# Patient Record
Sex: Female | Born: 1990 | Race: Black or African American | Hispanic: No | Marital: Single | Smoking: Never smoker
Health system: Southern US, Community
[De-identification: ages and names within clinical notes are randomized; demographics above are authoritative.]

## PROBLEM LIST (undated history)

## (undated) DIAGNOSIS — O24419 Gestational diabetes mellitus in pregnancy, unspecified control: Secondary | ICD-10-CM

## (undated) DIAGNOSIS — E119 Type 2 diabetes mellitus without complications: Secondary | ICD-10-CM

---

## 2013-12-14 IMAGING — US US MFM OB DETAIL+14 WK
1 series · 14 of 28 positions shown · non-contrast
Comparison: none

[Series 1: us mfm ob detail+14 wk · 0.23mm/px · 14 of 99 slices shown]
[im 4/99]
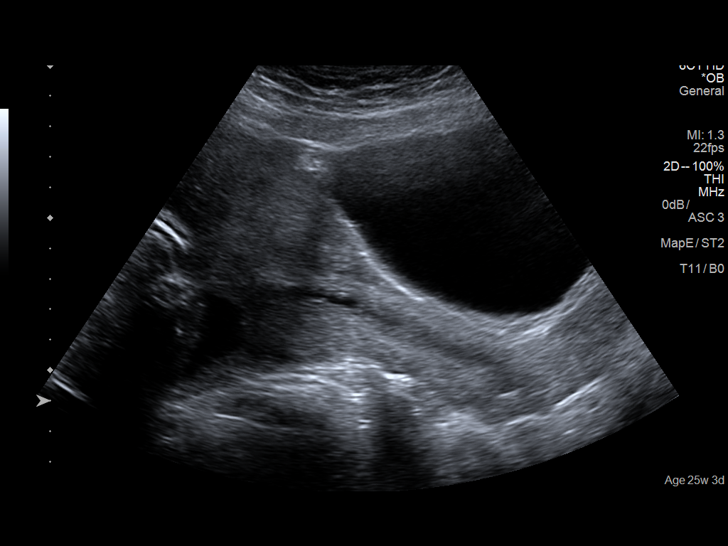
[im 11/99]
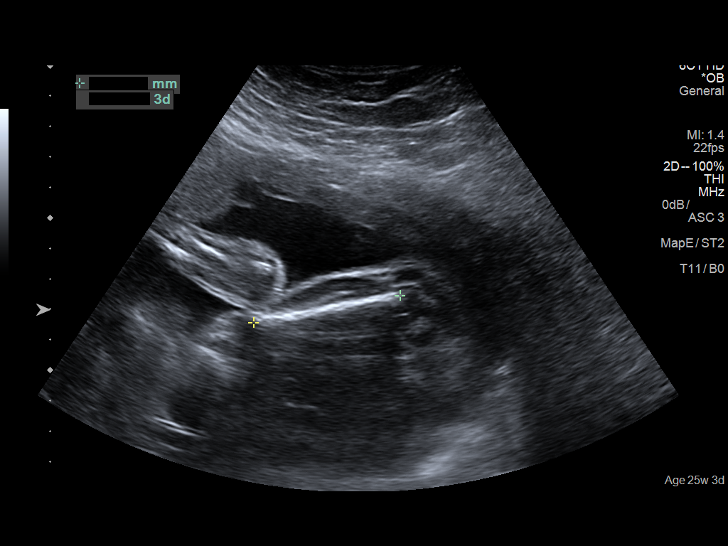
[im 19/99]
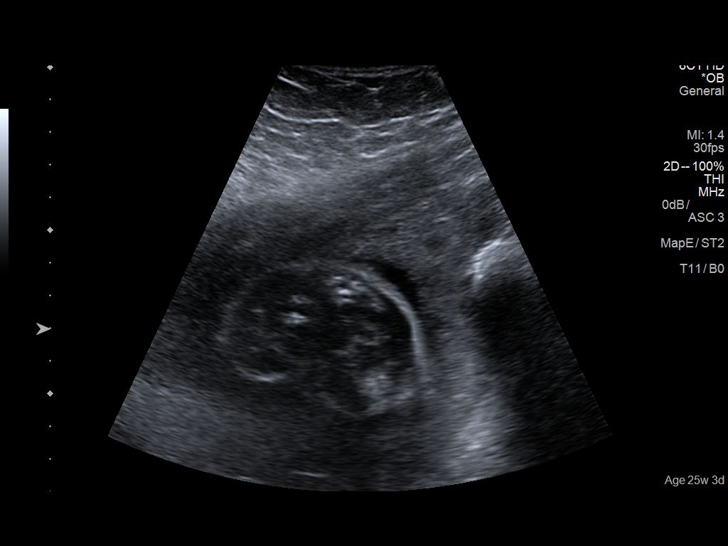
[im 26/99]
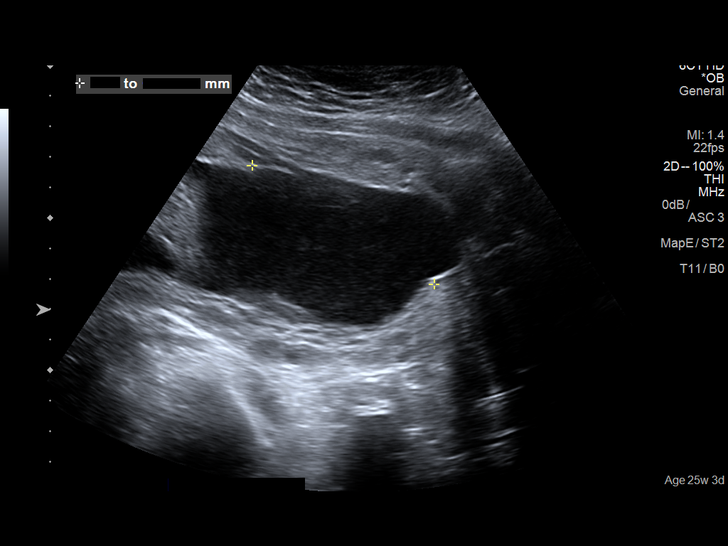
[im 33/99]
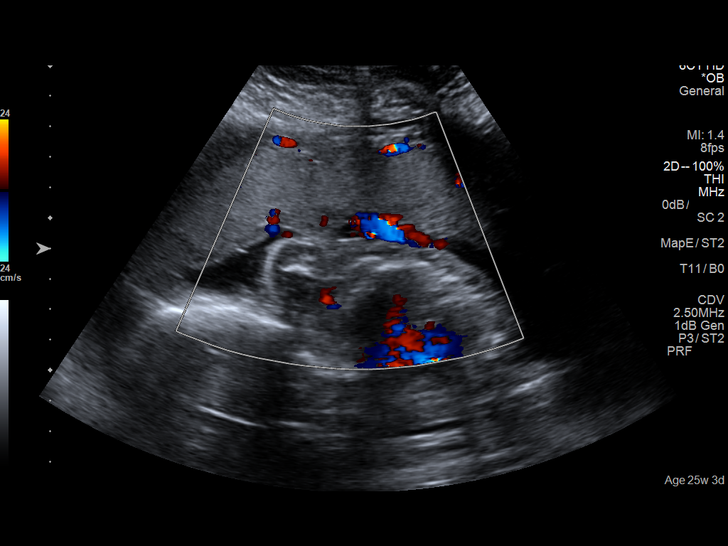
[im 40/99]
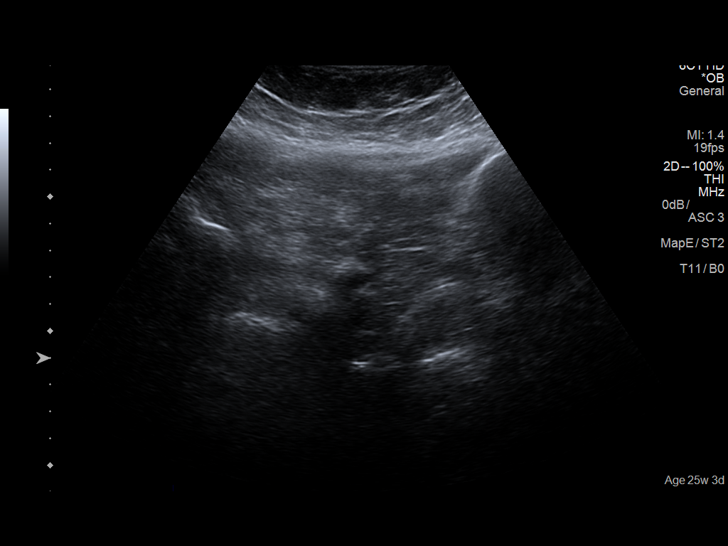
[im 48/99]
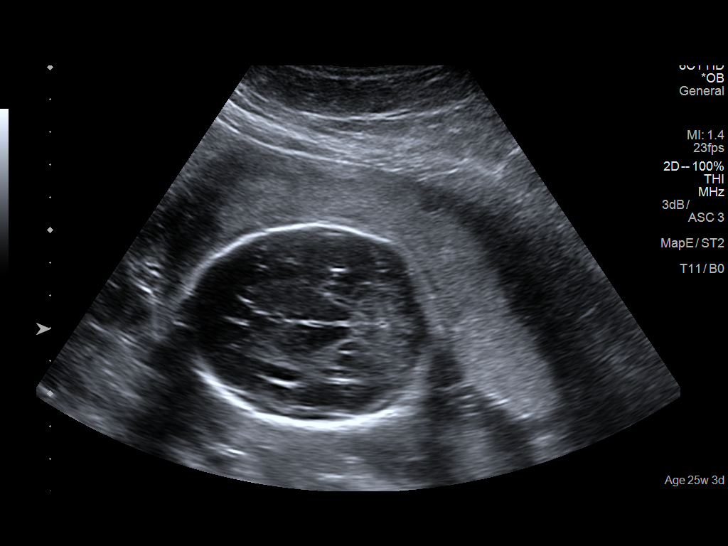
[im 55/99]
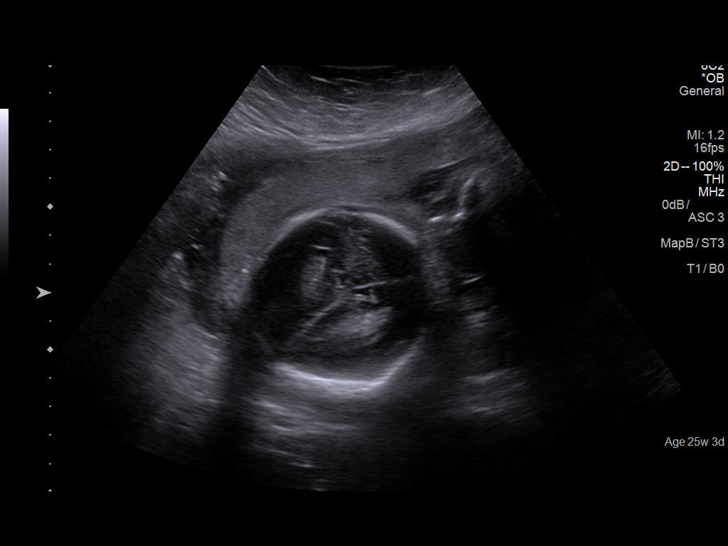
[im 62/99]
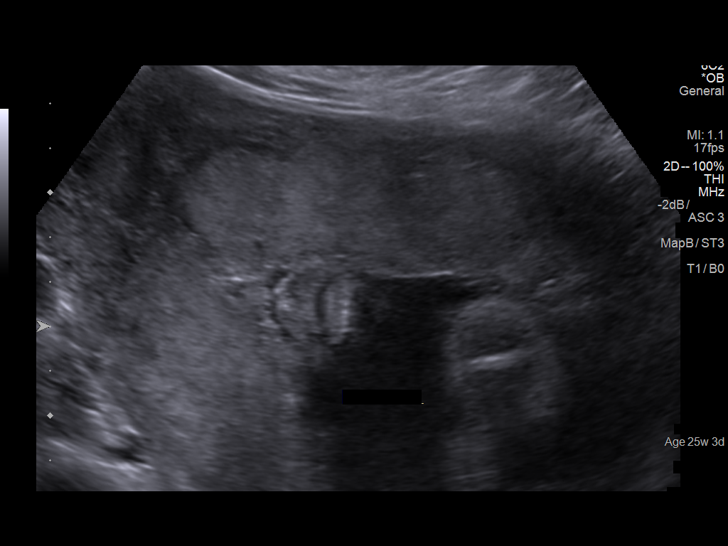
[im 69/99]
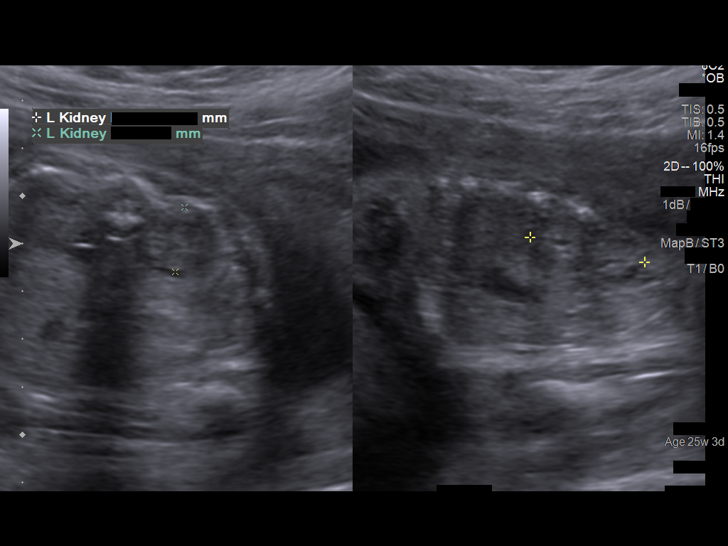
[im 77/99]
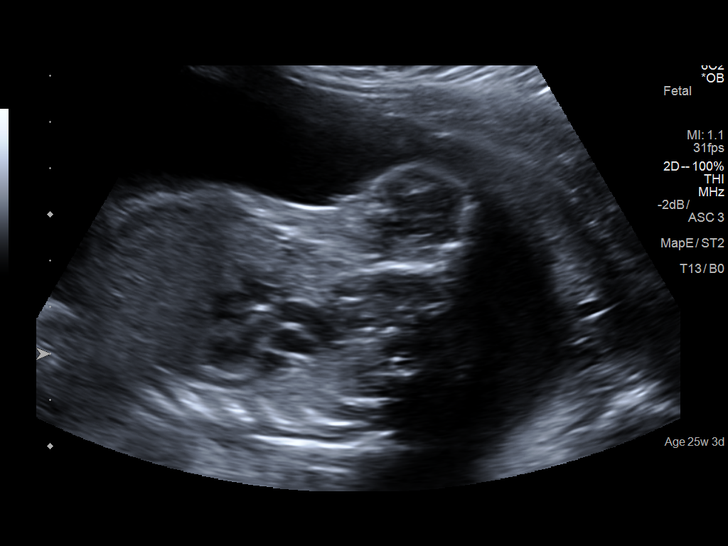
[im 84/99]
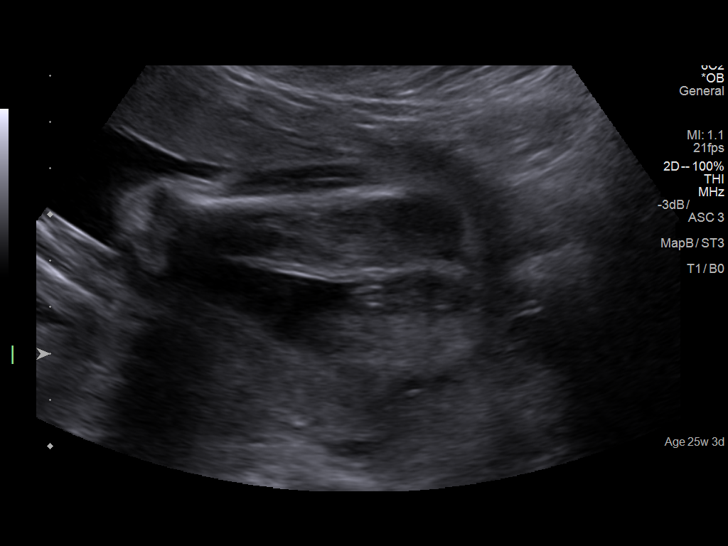
[im 91/99]
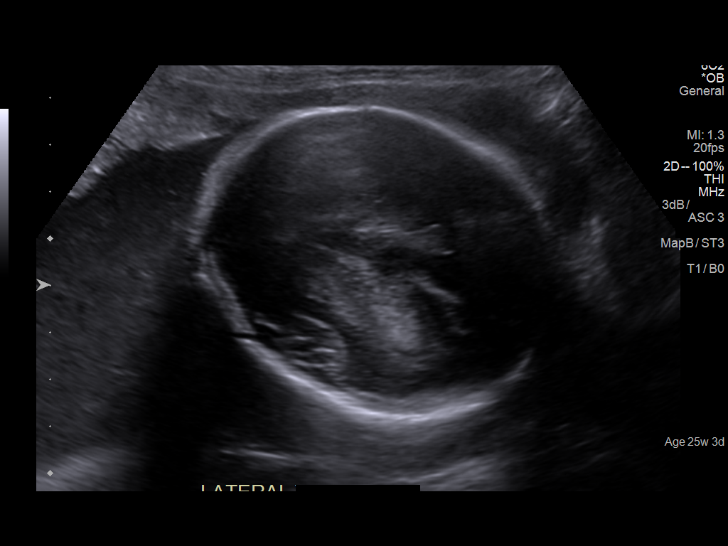
[im 99/99]
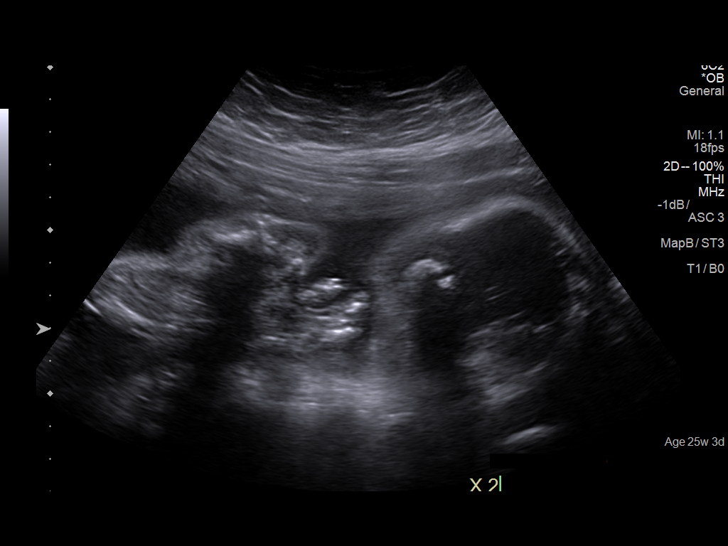

[14 of 28 positions shown; findings below may reference images not displayed]

Canned report from images found in remote index.

Refer to host system for actual result text.

## 2014-10-29 LAB — OB RESULTS CONSOLE GC/CHLAMYDIA
CHLAMYDIA, DNA PROBE: NEGATIVE
GC PROBE AMP, GENITAL: NEGATIVE

## 2014-11-10 LAB — OB RESULTS CONSOLE ABO/RH: RH Type: POSITIVE

## 2014-11-10 LAB — OB RESULTS CONSOLE VARICELLA ZOSTER ANTIBODY, IGG: VARICELLA IGG: NON-IMMUNE/NOT IMMUNE

## 2014-11-10 LAB — OB RESULTS CONSOLE RUBELLA ANTIBODY, IGM: RUBELLA: IMMUNE

## 2014-11-10 LAB — OB RESULTS CONSOLE HEPATITIS B SURFACE ANTIGEN: HEP B S AG: NEGATIVE

## 2014-11-10 LAB — OB RESULTS CONSOLE RPR: RPR: NONREACTIVE

## 2014-11-10 LAB — OB RESULTS CONSOLE HIV ANTIBODY (ROUTINE TESTING): HIV: NONREACTIVE

## 2014-12-15 ENCOUNTER — Encounter: Payer: Medicaid Other | Attending: Obstetrics and Gynecology | Admitting: *Deleted

## 2014-12-15 ENCOUNTER — Encounter: Payer: Self-pay | Admitting: *Deleted

## 2014-12-15 VITALS — BP 110/68 | Ht 64.0 in | Wt 212.4 lb

## 2014-12-15 DIAGNOSIS — O24419 Gestational diabetes mellitus in pregnancy, unspecified control: Secondary | ICD-10-CM | POA: Diagnosis not present

## 2014-12-15 DIAGNOSIS — O24410 Gestational diabetes mellitus in pregnancy, diet controlled: Secondary | ICD-10-CM

## 2014-12-15 NOTE — Progress Notes (Signed)
Diabetes Self-Management Education  Visit Type: First/Initial  Appt. Start Time: 1325 Appt. End Time: 1500  12/15/2014  [name], identified by name and date of birth, is a 24 y.o. female with a diagnosis of Diabetes: Gestational Diabetes.   ASSESSMENT  Blood pressure 110/68, height 5\' 4"  (1.626 m), weight 212 lb 6.4 oz (96.344 kg), last menstrual period 08/13/2014. Body mass index is 36.44 kg/(m^2).      Diabetes Self-Management Education - 12/15/14 1542    Visit Information   Visit Type First/Initial   Initial Visit   Diabetes Type Gestational Diabetes   Are you currently following a meal plan? Yes   What type of meal plan do you follow? no sugar, low carbs, better eating habits   Are you taking your medications as prescribed? Yes   Date Diagnosed 2 weeks ago   Health Coping   How would you rate your overall health? Fair   Psychosocial Assessment   Patient Belief/Attitude about Diabetes Motivated to manage diabetes  also reports "upset"   Self-care barriers None   Self-management support Doctor's office;Family;Friends   Patient Concerns Nutrition/Meal planning;Monitoring;Glycemic Control;Weight Control;Healthy Lifestyle   Special Needs None   Preferred Learning Style Hands on;Visual   Learning Readiness Change in progress   How often do you need to have someone help you when you read instructions, pamphlets, or other written materials from your doctor or pharmacy? 1 - Never   What is the last grade level you completed in school? 12th grade   Complications   How often do you check your blood sugar? 0 times/day (not testing)  Provided Accu-Chek Aviva Connect meter and instructed on use. BG upon return demonstration was 112 mg/dL at 1:61 pm - 2 hrs pp   Have you had a dilated eye exam in the past 12 months? No   Have you had a dental exam in the past 12 months? No   Are you checking your feet? No   Dietary Intake   Breakfast egg whites, [place] bacon, cereal and  milk   Snack (morning) pretzels, nuts   Lunch grilled chicken salad, fish, fruit   Snack (afternoon) wheat thins   Dinner fish, chicken casserole with broccolli and rice   Beverage(s) water, diet green tea   Exercise   Exercise Type ADL's   Patient Education   Previous Diabetes Education No   Disease state  Definition of diabetes, type 1 and 2, and the diagnosis of diabetes;Factors that contribute to the development of diabetes   Nutrition management  Role of diet in the treatment of diabetes and the relationship between the three main macronutrients and blood glucose level   Physical activity and exercise  Role of exercise on diabetes management, blood pressure control and cardiac health.   Monitoring Taught/evaluated SMBG meter.;Purpose and frequency of SMBG.;Identified appropriate SMBG and/or A1C goals.   Chronic complications Relationship between chronic complications and blood glucose control   Psychosocial adjustment Identified and addressed patients feelings and concerns about diabetes   Preconception care Pregnancy and GDM  Role of pre-pregnancy blood glucose control on the development of the fetus;Reviewed with patient blood glucose goals with pregnancy   Individualized Goals (developed by patient)   Reducing Risk Improve blood sugars Prevent diabetes complications Lead a healthier lifestyle Become more fit   Outcomes   Expected Outcomes Demonstrated interest in learning. Expect positive outcomes      Individualized Plan for Diabetes Self-Management Training:   Learning Objective:  Patient will have a  greater understanding of diabetes self-management. Patient education plan is to attend individual and/or group sessions per assessed needs and concerns.   Plan:  Read booklet on Gestational Diabetes Follow Gestational Meal Planning Guidelines Complete a 3 Day Food Record and bring to next appointment Check blood sugars 4 x day - before breakfast and 2 hrs after every meal  and record  Call MD for prescription for meter strips and lancets Strips  Accu-Chek Aviva Plus  Lancets   Accu-Chek FastClix Bring blood sugar log to next appointment Purchase urine ketone strips in blood sugars not in control Check urine ketones every am:  If + increase bedtime snack to 1 protein and 2 carbohydrate servings Walk 20-30 minutes at least 5 x week if permitted by MD  Expected Outcomes:  Demonstrated interest in learning. Expect positive outcomes  Education material provided: Gestational Meal Planning Guidelines Viewed Gestational Diabetes Video Meter - Accu-Chek Aviva Connect 3 Day Food Record Goals for a Healthy Pregnancy  If problems or questions, patient to contact team via:   Sharion Settler, RN, CCM, CDE [phone]  Future DSME appointment:  Wednesday Sept 21, 2016 at 10:30 am with dietitian

## 2014-12-15 NOTE — Patient Instructions (Signed)
Read booklet on Gestational Diabetes Follow Gestational Meal Planning Guidelines Complete a 3 Day Food Record and bring to next appointment Check blood sugars 4 x day - before breakfast and 2 hrs after every meal and record  Call MD for prescription for meter strips and lancets Strips  Accu-Chek Aviva Plus  Lancets   Accu-Chek FastClix Bring blood sugar log to next appointment Purchase urine ketone strips if blood sugars not in control Check urine ketones every am:  If + increase bedtime snack to 1 protein and 2 carbohydrate servings Walk 20-30 minutes at least 5 x week if permitted by MD

## 2014-12-22 ENCOUNTER — Ambulatory Visit: Payer: Medicaid Other | Admitting: Dietician

## 2015-01-04 ENCOUNTER — Encounter: Payer: Self-pay | Admitting: Dietician

## 2015-01-04 NOTE — Progress Notes (Signed)
Sent discharge letter to MD, as patient has not rescheduled her missed appointment.

## 2015-01-24 ENCOUNTER — Other Ambulatory Visit: Payer: Self-pay | Admitting: Certified Nurse Midwife

## 2015-01-24 DIAGNOSIS — Z3689 Encounter for other specified antenatal screening: Secondary | ICD-10-CM

## 2015-02-07 ENCOUNTER — Ambulatory Visit (HOSPITAL_BASED_OUTPATIENT_CLINIC_OR_DEPARTMENT_OTHER)
Admission: RE | Admit: 2015-02-07 | Discharge: 2015-02-07 | Disposition: A | Discharge status: Home / Self Care | Payer: Medicaid Other | Source: Ambulatory Visit | Attending: Certified Nurse Midwife | Admitting: Certified Nurse Midwife

## 2015-02-07 ENCOUNTER — Ambulatory Visit
Admission: RE | Admit: 2015-02-07 | Discharge: 2015-02-07 | Disposition: A | Discharge status: Home / Self Care | Payer: Medicaid Other | Source: Ambulatory Visit | Attending: Maternal & Fetal Medicine | Admitting: Maternal & Fetal Medicine

## 2015-02-07 DIAGNOSIS — E282 Polycystic ovarian syndrome: Secondary | ICD-10-CM | POA: Diagnosis not present

## 2015-02-07 DIAGNOSIS — O99210 Obesity complicating pregnancy, unspecified trimester: Secondary | ICD-10-CM | POA: Diagnosis not present

## 2015-02-07 DIAGNOSIS — Z3689 Encounter for other specified antenatal screening: Secondary | ICD-10-CM

## 2015-02-07 DIAGNOSIS — O24912 Unspecified diabetes mellitus in pregnancy, second trimester: Secondary | ICD-10-CM

## 2015-02-07 DIAGNOSIS — E669 Obesity, unspecified: Secondary | ICD-10-CM | POA: Diagnosis not present

## 2015-02-07 DIAGNOSIS — Z3A25 25 weeks gestation of pregnancy: Secondary | ICD-10-CM | POA: Diagnosis not present

## 2015-02-07 LAB — COMPREHENSIVE METABOLIC PANEL
ALBUMIN: 3.5 g/dL (ref 3.5–5.0)
ALK PHOS: 90 U/L (ref 38–126)
ALK PHOS: 89 U/L (ref 38–126)
ALT: 11 U/L — AB (ref 14–54)
ALT: 10 U/L — AB (ref 14–54)
AST: 14 U/L — AB (ref 15–41)
AST: 14 U/L — ABNORMAL LOW (ref 15–41)
Albumin: 3.4 g/dL — ABNORMAL LOW (ref 3.5–5.0)
Anion gap: 5 (ref 5–15)
Anion gap: 7 (ref 5–15)
BILIRUBIN TOTAL: 0.6 mg/dL (ref 0.3–1.2)
BILIRUBIN TOTAL: 0.7 mg/dL (ref 0.3–1.2)
BUN: 5 mg/dL — AB (ref 6–20)
BUN: <5 mg/dL — ABNORMAL LOW (ref 6–20)
CALCIUM: 8.9 mg/dL (ref 8.9–10.3)
CALCIUM: 9.2 mg/dL (ref 8.9–10.3)
CO2: 22 mmol/L (ref 22–32)
CO2: 23 mmol/L (ref 22–32)
CREATININE: 0.61 mg/dL (ref 0.44–1.00)
CREATININE: 0.62 mg/dL (ref 0.44–1.00)
Chloride: 106 mmol/L (ref 101–111)
Chloride: 107 mmol/L (ref 101–111)
GFR calc Af Amer: >60 mL/min (ref >60)
GFR calc Af Amer: >60 mL/min (ref >60)
GFR calc non Af Amer: >60 mL/min (ref >60)
GLUCOSE: 85 mg/dL (ref 65–99)
GLUCOSE: 85 mg/dL (ref 65–99)
Potassium: 3.6 mmol/L (ref 3.5–5.1)
Potassium: 3.8 mmol/L (ref 3.5–5.1)
SODIUM: 134 mmol/L — AB (ref 135–145)
Sodium: 136 mmol/L (ref 135–145)
TOTAL PROTEIN: 7.4 g/dL (ref 6.5–8.1)
Total Protein: 7.5 g/dL (ref 6.5–8.1)

## 2015-02-07 LAB — GLUCOSE, CAPILLARY: Glucose-Capillary: 77 mg/dL (ref 65–99)

## 2015-02-07 NOTE — Addendum Note (Signed)
Encounter addended by: Lady DeutscherAndra Margrette Wynia, MD on: 02/07/2015  2:59 PM<BR>     Documentation filed: Notes Section

## 2015-02-07 NOTE — Progress Notes (Addendum)
[facility] Consultation   Chief Complaint: Diabetes in pregnancy  HPI: Chelsea Mathis is a 24 y.o. G3P1011 at [redacted]w[redacted]d by LMP and [redacted]w[redacted]d [place] who presents in consultation from [facility] for gestational versus type 2 diabetes.   Obstetric History:  Obstetric History   G3   P1   T1   P0   A1   TAB0   SAB0   E0   M0   L1     # Outcome Date GA Lbr Len/2nd Weight Sex Delivery Anes PTL Lv  3 Current           2 Term 07/21/10   8 lb 9.6 oz (3.901 kg) M Vag-Spont   Y  1 AB 2008     TAB         Gynecologic History:  Patient's last menstrual period was 08/13/2014.  She was evaluated for and diagnosed with PCOS last year.  She is fairly certain that she had thyroid testing shich was normal.    Past Medical History: Patient  has no past medical history on file.   Past Surgical History: She  has no past surgical history on file.   Medications: Prenatal vitamins.  Glyburide 2.5 mg per day.  Allergies: Patient has No Known Allergies.   Social History: Patient  reports that she has never smoked. She has never used smokeless tobacco. She reports that she does not drink alcohol or use illicit drugs.  The patient is not working now.  The FOB works as a Production designer, theatre/television/film.  Family History: family history includes Diabetes in her maternal uncle and mother.   Review of Systems A full 12 point review of systems was negative or as noted in the History of Present Illness.  Review of BSs since last adjustment to glyburide 2.5 mg with dinner:  FBSs range from 85-97 with 1/7 > 95 2 hr PPs breakfast range from 70-114 with 0/5 > 120 2 hr PPs lunch range from 102-121 with 1/6 > 120 2 hr PPs dinner range from 90-121 with 1/6 > 120   Physical Exam: T. 97.6 HR 88 BP 121/173 Weight 212 Ht 5'4".  BMI 36  [place] today - normal anatomy, no previa, cx   P/C ratio 11/10/2014 - 155 (normal)  Asessement: 24 yo gravida 3 para 1011 at [redacted]w[redacted]d gestation by LMP and [redacted]w[redacted]d [place]  with:  Recommendations: Gestational versus type 2 diabetes --   The patient was counseled that the target range for fasting blood sugars is equal to or less than 95 and the target range for postprandial blood sugars is equal to or less than 120. If more than one third of her blood sugars exceed the target range at any time point, plese refer her back to [place] and we will start her on insulin, as she does not tolerate larger doses of glyburide. --   We would recommend baseline laboratory studies to include an EKG, and a a complete metabolic profile.  She will get the CMP today and the EKG tomorrow. --  We would recommend a fetal echo.  This is already scheduled for tomorrow.   --  We would recommend fetal surveillance to include monthly ultrasounds starting at 28 weeks, weekly antepartum testing starting at [redacted] weeks gestation, twice weekly testing starting at [redacted] weeks gestation and delivery at [redacted] weeks gestation.  If she develops macrosomia or polyhydramnios we would recommend delivery at [redacted] weeks gestation. We would recommend delivery sooner if she develops severe preeclampsia or  evidence of fetal compromise. -- We would recommend follow-up with her primary care physician regarding her blood sugars. Obesity -- The patient was counseled  today about limiting her weight gain this pregnancy to 10-15 lbs. -- We would recommend pneumatic compression devices in labor and postpartum. --  If she were to have a cesarean delivery, we would recommend application of a negative pressure dressing.   -- Postpartum, we would recommend referral for weight loss management.  Total time spent with the patient was 40 minutes with greater than 50% spent in counseling and coordination of care.  We appreciate this consult and will be happy to be involved in the ongoing care of [name].  Argentina PonderAndra H. Abigayl Hor, MD [facility]  Addendum:       Ref Range 1:51 PM    Sodium 135 - 145 mmol/L 136   Potassium 3.5 - 5.1  mmol/L 3.8   Chloride 101 - 111 mmol/L 106   CO2 22 - 32 mmol/L 23   Glucose, Bld 65 - 99 mg/dL 85   BUN 6 - 20 mg/dL 5 (L)   Creatinine, Ser 0.44 - 1.00 mg/dL 4.090.62   Calcium 8.9 - 81.110.3 mg/dL 9.2   Total Protein 6.5 - 8.1 g/dL 7.4   Albumin 3.5 - 5.0 g/dL 3.4 (L)   AST 15 - 41 U/L 14 (L)   ALT 14 - 54 U/L 10 (L)   Alkaline Phosphatase 38 - 126 U/L 89   Total Bilirubin 0.3 - 1.2 mg/dL 0.7   GFR calc non Af Amer >60 mL/min >60

## 2015-02-08 ENCOUNTER — Ambulatory Visit
Admission: RE | Admit: 2015-02-08 | Discharge: 2015-02-08 | Disposition: A | Discharge status: Home / Self Care | Payer: Medicaid Other | Source: Ambulatory Visit | Attending: Certified Nurse Midwife | Admitting: Certified Nurse Midwife

## 2015-02-08 DIAGNOSIS — O24419 Gestational diabetes mellitus in pregnancy, unspecified control: Secondary | ICD-10-CM | POA: Diagnosis not present

## 2015-02-08 DIAGNOSIS — Z3A00 Weeks of gestation of pregnancy not specified: Secondary | ICD-10-CM | POA: Insufficient documentation

## 2015-04-03 NOTE — L&D Delivery Note (Signed)
Obstetrical Delivery Note   Date of Delivery:   05/15/2015 Primary OB:   [facility] Gestational Age/EDD: [redacted]w[redacted]d (Dated by LMP) Antepartum complications: GDMA2, Obesity  Delivered By:   Farrel Conners, CNM  Delivery Type:   spontaneous vaginal delivery  Procedure Details:   SVD after a short pushing stage of a vigorous female infant. Baby placed on mother's abdomen and delayed cord clamping accomplished after cord was pulseless. Cord cut by FOB and baby placed skin to skin on mother's chest.  Anesthesia:    epidural Intrapartum complications: None GBS:    positive Laceration:    labial Episiotomy:    none Placenta:    Via active 3rd stage. To pathology: no Estimated Blood Loss:  * No surgery found *  Baby:    Liveborn female, Apgars 8/9, weight pending    Chelsea Mathis, CNM

## 2015-04-20 LAB — OB RESULTS CONSOLE GBS: STREP GROUP B AG: POSITIVE

## 2015-05-13 ENCOUNTER — Inpatient Hospital Stay
Admission: EM | Admit: 2015-05-13 | Discharge: 2015-05-17 | DRG: 774 | Disposition: A | Discharge status: Home / Self Care | Payer: Medicaid Other | Attending: Obstetrics and Gynecology | Admitting: Obstetrics and Gynecology

## 2015-05-13 DIAGNOSIS — Z6838 Body mass index (BMI) 38.0-38.9, adult: Secondary | ICD-10-CM | POA: Diagnosis not present

## 2015-05-13 DIAGNOSIS — E669 Obesity, unspecified: Secondary | ICD-10-CM | POA: Diagnosis present

## 2015-05-13 DIAGNOSIS — O24419 Gestational diabetes mellitus in pregnancy, unspecified control: Secondary | ICD-10-CM | POA: Diagnosis present

## 2015-05-13 DIAGNOSIS — O24429 Gestational diabetes mellitus in childbirth, unspecified control: Secondary | ICD-10-CM | POA: Diagnosis present

## 2015-05-13 DIAGNOSIS — O2412 Pre-existing diabetes mellitus, type 2, in childbirth: Secondary | ICD-10-CM | POA: Diagnosis present

## 2015-05-13 DIAGNOSIS — O99824 Streptococcus B carrier state complicating childbirth: Secondary | ICD-10-CM | POA: Diagnosis present

## 2015-05-13 DIAGNOSIS — Z3A39 39 weeks gestation of pregnancy: Secondary | ICD-10-CM

## 2015-05-13 DIAGNOSIS — O99214 Obesity complicating childbirth: Secondary | ICD-10-CM | POA: Diagnosis present

## 2015-05-13 DIAGNOSIS — E119 Type 2 diabetes mellitus without complications: Secondary | ICD-10-CM | POA: Diagnosis present

## 2015-05-13 HISTORY — DX: Gestational diabetes mellitus in pregnancy, unspecified control: O24.419

## 2015-05-13 LAB — ABO/RH: ABO/RH(D): O POS

## 2015-05-13 LAB — CBC
HEMATOCRIT: 35.9 % (ref 35.0–47.0)
HEMOGLOBIN: 11.6 g/dL — AB (ref 12.0–16.0)
MCH: 25.8 pg — ABNORMAL LOW (ref 26.0–34.0)
MCHC: 32.4 g/dL (ref 32.0–36.0)
MCV: 79.6 fL — AB (ref 80.0–100.0)
Platelets: 308 10*3/uL (ref 150–440)
RBC: 4.51 MIL/uL (ref 3.80–5.20)
RDW: 15.4 % — ABNORMAL HIGH (ref 11.5–14.5)
WBC: 13.7 10*3/uL — AB (ref 3.6–11.0)

## 2015-05-13 LAB — TYPE AND SCREEN
ABO/RH(D): O POS
Antibody Screen: NEGATIVE

## 2015-05-13 MED ORDER — OXYTOCIN 40 UNITS IN LACTATED RINGERS INFUSION - SIMPLE MED
2.5000 [IU]/h | INTRAVENOUS | Status: DC
  Filled 2015-05-13: qty 1000

## 2015-05-13 MED ORDER — PENICILLIN G POTASSIUM 5000000 UNITS IJ SOLR
5.0000 10*6.[IU] | Freq: Once | INTRAVENOUS | Status: AC
  Administered 2015-05-14: 5 10*6.[IU] via INTRAVENOUS
  Filled 2015-05-13 (×2): qty 5

## 2015-05-13 MED ORDER — OXYTOCIN 10 UNIT/ML IJ SOLN
INTRAMUSCULAR | Status: AC
  Filled 2015-05-13: qty 2

## 2015-05-13 MED ORDER — MISOPROSTOL 200 MCG PO TABS
ORAL_TABLET | ORAL | Status: AC
  Filled 2015-05-13: qty 4

## 2015-05-13 MED ORDER — BUTORPHANOL TARTRATE 1 MG/ML IJ SOLN: 1.0000 mg | INTRAMUSCULAR | Status: DC | PRN

## 2015-05-13 MED ORDER — ZOLPIDEM TARTRATE 5 MG PO TABS
5.0000 mg | ORAL_TABLET | Freq: Every evening | ORAL | Status: DC | PRN
  Administered 2015-05-13 – 2015-05-15 (×2): 5 mg via ORAL
  Filled 2015-05-13 (×2): qty 1

## 2015-05-13 MED ORDER — AMMONIA AROMATIC IN INHA
RESPIRATORY_TRACT | Status: AC
  Filled 2015-05-13: qty 10

## 2015-05-13 MED ORDER — LIDOCAINE HCL (PF) 1 % IJ SOLN: 30.0000 mL | INTRAMUSCULAR | Status: DC | PRN

## 2015-05-13 MED ORDER — ACETAMINOPHEN 325 MG PO TABS: 650.0000 mg | ORAL_TABLET | ORAL | Status: DC | PRN

## 2015-05-13 MED ORDER — PENICILLIN G POTASSIUM 5000000 UNITS IJ SOLR
2.5000 10*6.[IU] | INTRAVENOUS | Status: DC
  Administered 2015-05-14 – 2015-05-15 (×7): 2.5 10*6.[IU] via INTRAVENOUS
  Filled 2015-05-13 (×20): qty 2.5

## 2015-05-13 MED ORDER — DINOPROSTONE 10 MG VA INST
10.0000 mg | VAGINAL_INSERT | Freq: Once | VAGINAL | Status: AC
  Administered 2015-05-13: 10 mg via VAGINAL
  Filled 2015-05-13: qty 1

## 2015-05-13 MED ORDER — LACTATED RINGERS IV SOLN
500.0000 mL | INTRAVENOUS | Status: DC | PRN
  Administered 2015-05-15: 500 mL via INTRAVENOUS

## 2015-05-13 MED ORDER — LACTATED RINGERS IV SOLN
INTRAVENOUS | Status: DC
  Administered 2015-05-13 – 2015-05-15 (×5): via INTRAVENOUS

## 2015-05-13 MED ORDER — TERBUTALINE SULFATE 1 MG/ML IJ SOLN: 0.2500 mg | Freq: Once | INTRAMUSCULAR | Status: DC | PRN

## 2015-05-13 MED ORDER — LIDOCAINE HCL (PF) 1 % IJ SOLN
INTRAMUSCULAR | Status: AC
  Filled 2015-05-13: qty 30

## 2015-05-13 MED ORDER — ONDANSETRON HCL 4 MG/2ML IJ SOLN: 4.0000 mg | Freq: Four times a day (QID) | INTRAMUSCULAR | Status: DC | PRN

## 2015-05-13 MED ORDER — OXYTOCIN BOLUS FROM INFUSION
500.0000 mL | INTRAVENOUS | Status: DC
  Administered 2015-05-15: 500 mL via INTRAVENOUS

## 2015-05-14 LAB — GLUCOSE, CAPILLARY
GLUCOSE-CAPILLARY: 100 mg/dL — AB (ref 65–99)
GLUCOSE-CAPILLARY: 113 mg/dL — AB (ref 65–99)
GLUCOSE-CAPILLARY: 85 mg/dL (ref 65–99)
GLUCOSE-CAPILLARY: 93 mg/dL (ref 65–99)
GLUCOSE-CAPILLARY: 111 mg/dL — AB (ref 65–99)
Glucose-Capillary: 117 mg/dL — ABNORMAL HIGH (ref 65–99)
Glucose-Capillary: 90 mg/dL (ref 65–99)

## 2015-05-14 MED ORDER — MISOPROSTOL 25 MCG QUARTER TABLET
25.0000 ug | ORAL_TABLET | ORAL | Status: DC
  Administered 2015-05-14 – 2015-05-15 (×3): 25 ug via ORAL
  Filled 2015-05-14 (×3): qty 0.25

## 2015-05-14 MED ORDER — OXYTOCIN 40 UNITS IN LACTATED RINGERS INFUSION - SIMPLE MED
1.0000 m[IU]/min | INTRAVENOUS | Status: DC
  Administered 2015-05-14: 1 m[IU]/min via INTRAVENOUS

## 2015-05-14 MED ORDER — TERBUTALINE SULFATE 1 MG/ML IJ SOLN: 0.2500 mg | Freq: Once | INTRAMUSCULAR | Status: DC | PRN

## 2015-05-14 NOTE — H&P (Signed)
OB History & Physical   History of Present Illness:  Chief Complaint: IUP at 39 weeks  HPI:  Chelsea Mathis is a 25 y.o. G47P1011 female at [redacted]w[redacted]d dated by LMP.  Her pregnancy has been complicated by Obesity, Gestational Diabetes vs DMII.    She denies contractions.   She denies leakage of fluid.   She denies vaginal bleeding.   She reports fetal movement.    Maternal Medical History:   Past Medical History  Diagnosis Date  . Gestational diabetes     History reviewed. No pertinent past surgical history.  No Known Allergies  Prior to Admission medications   Medication Sig Start Date End Date Taking? Authorizing Provider  glyBURIDE (DIABETA) 2.5 MG tablet Take 2.5 mg by mouth daily with supper.    Historical Provider, MD  Prenatal Vit-Fe Fumarate-FA (PRENATAL MULTIVITAMIN) TABS tablet Take 1 tablet by mouth daily at 12 noon.    Historical Provider, MD    OB History  Gravida Para Term Preterm AB SAB TAB Ectopic Multiple Living  # Outcome Date GA Lbr Len/2nd Weight Sex Delivery Anes PTL Lv  3 Current           2 Term 07/21/10   3.901 kg (8 lb 9.6 oz) M Vag-Spont   Y  1 AB 2008     TAB         Prenatal care site: [facility]  Social History: She  reports that she has never smoked. She has never used smokeless tobacco. She reports that she does not drink alcohol or use illicit drugs.  Family History: family history includes Diabetes in her brother, maternal uncle, and mother; Hypertension in her mother; Leukemia in her paternal grandfather and paternal grandmother; Stroke in her maternal grandmother.   Review of Systems: Negative x 10 systems reviewed except as noted in the HPI.    Physical Exam:  Vital Signs: BP 123/71 mmHg  Pulse 72  Temp(Src) 97.9 F (36.6 C) (Oral)  Resp 24  Ht  (1.626 m)  Wt 101.606 kg (224 lb)  BMI 38.43 kg/m2  LMP 08/13/2014 General: no acute distress.  HEENT: normocephalic, atraumatic Heart: regular rate & rhythm.   No murmurs/rubs/gallops Lungs: clear to auscultation bilaterally Abdomen: soft, gravid, non-tender;  EFW: 8 pounds Pelvic:   External: Normal external female genitalia  Cervix: Dilation: Fingertip / Effacement (%): 40 / Station: -3   Extremities: non-tender, symmetric, trace edema bilaterally.  DTRs: 2+  Neurologic: Alert & oriented x 3.    Pertinent Results:  Prenatal Labs: Blood type/Rh O positive  Antibody screen negative  Rubella Immune  Varicella Not immune    RPR negative  HBsAg negative  HIV negative  GC negative  Chlamydia negative  Genetic screening 1st trimester negative, MSAFP declined  1 hour GTT 240  3 hour GTT [phone]  GBS positive on 04/20/15   Baseline FHR: 140 beats/min   Variability: moderate   Accelerations: present   Decelerations: absent Contractions: present frequency: occasional Overall assessment: Category I   Assessment:  Chelsea Mathis is a 25 y.o. G16P1011 female at [redacted]w[redacted]d with gestational diabetes at 39 weeks 0 days.   Plan:  1. Admit to Labor & Delivery  2. CBC, T&S, Clrs, IVF 3. GBS positive- pph begin at 4cm or SROM 4. Fetal well-being: Cat I tracing 5. IOL cervidil overnight  Uchenna Seufert, CNM  This patient and plan were discussed with Dr Jean Rosenthal  05/14/2015    

## 2015-05-14 NOTE — Progress Notes (Signed)
L&D  Progress Note  After 4-5 hours on Pitocin, patient sleeping, contractions very mild and cervix unchanged Pitocin discontinued and patient had supper and allowed to eat regular diet for supper and ambulate CBGs all WNL 85-117 FHR 135 with accelerations to 150s to 160s Toco: occasional contraction  A: IOL for GDMA2  P: Cytotec 25 mcg q4 hrs Continue GBS PPX  Charina Fons, CNM

## 2015-05-14 NOTE — Progress Notes (Addendum)
L&D Progress Note  25 year old G3 P1011 with EDC=05/20/2015 by LMP=12wk5 d ultrasound presented last night at 39 weeks for IOL due to GDMA2 vs Type II diabetes. Currently taking 5 mgm glyburide at supper meal. Pregancy also c/b obesity. Last growth scan 1/30 placed EFW at 6#12oz (45%) with an AFI around 20cm.  Received Cervidil last night around 2200 (cx was a FT) and is feeling some contractions today. GBS positive-has not been started on PPX    S: Feeling some contractions  O: BP 123/71 mmHg  Pulse 72  Temp(Src) 97.9 F (36.6 C) (Oral)  Resp 24  Ht  (1.626 m)  Wt 101.606 kg (224 lb)  BMI 38.43 kg/m2  LMP 08/13/2014  CBG=100 FHR: 135 baseline with accelerations to 150 with moderate variability Toco: contractions q2-6 min aprt [place]: cephalic (LOT) Results for orders placed or performed during the hospital encounter of 05/13/15 (from the past 24 hour(s))  CBC     Status: Abnormal   Collection Time: 05/13/15  8:59 PM  Result Value Ref Range   WBC 13.7 (H) 3.6 - 11.0 K/uL   RBC 4.51 3.80 - 5.20 MIL/uL   Hemoglobin 11.6 (L) 12.0 - 16.0 g/dL   HCT 16.1 09.6 - 04.5 %   MCV 79.6 (L) 80.0 - 100.0 fL   MCH 25.8 (L) 26.0 - 34.0 pg   MCHC 32.4 32.0 - 36.0 g/dL   RDW 40.9 (H) 81.1 - 91.4 %   Platelets 308 150 - 440 K/uL  Type and screen [facility]     Status: None   Collection Time: 05/13/15  8:59 PM  Result Value Ref Range   ABO/RH(D) O POS    Antibody Screen NEG    Sample Expiration 05/16/2015   ABO/Rh     Status: None   Collection Time: 05/13/15  9:00 PM  Result Value Ref Range   ABO/RH(D) O POS   Glucose, capillary     Status: Abnormal   Collection Time: 05/14/15  8:38 AM  Result Value Ref Range   Glucose-Capillary 100 (H) 65 - 99 mg/dL    A: IUP at 78.2 weeks with GDMA2 for IOL GBS positive Cat 1 tracing  P: Start GBS PPX Start CBG q2 hours Remove Cervidil at 1000  Latifa Noble, CNM  Cervidil removed at 1030. Cervix: 2.5-3cm/ 50-60%/-1  to -2/ posterior Contractions q3-4 min apart FHR 140s with accelerations to 160s with mod variability   P: Start Pitocin if contractions space out  Lea Walbert, CNM

## 2015-05-15 ENCOUNTER — Encounter: Payer: Self-pay | Admitting: Anesthesiology

## 2015-05-15 ENCOUNTER — Inpatient Hospital Stay: Payer: Medicaid Other | Admitting: Anesthesiology

## 2015-05-15 LAB — GLUCOSE, CAPILLARY
GLUCOSE-CAPILLARY: 123 mg/dL — AB (ref 65–99)
Glucose-Capillary: 107 mg/dL — ABNORMAL HIGH (ref 65–99)
Glucose-Capillary: 70 mg/dL (ref 65–99)
Glucose-Capillary: 72 mg/dL (ref 65–99)
Glucose-Capillary: 85 mg/dL (ref 65–99)
Glucose-Capillary: 86 mg/dL (ref 65–99)
Glucose-Capillary: 90 mg/dL (ref 65–99)
Glucose-Capillary: 96 mg/dL (ref 65–99)

## 2015-05-15 LAB — RPR: RPR Ser Ql: NONREACTIVE

## 2015-05-15 MED ORDER — PHENYLEPHRINE 40 MCG/ML (10ML) SYRINGE FOR IV PUSH (FOR BLOOD PRESSURE SUPPORT)
80.0000 ug | PREFILLED_SYRINGE | INTRAVENOUS | Status: DC | PRN
  Filled 2015-05-15: qty 2

## 2015-05-15 MED ORDER — FERROUS SULFATE 325 (65 FE) MG PO TABS
325.0000 mg | ORAL_TABLET | Freq: Every day | ORAL | Status: DC
  Administered 2015-05-16 – 2015-05-17 (×2): 325 mg via ORAL
  Filled 2015-05-15 (×4): qty 1

## 2015-05-15 MED ORDER — PRENATAL MULTIVITAMIN CH
1.0000 | ORAL_TABLET | Freq: Every day | ORAL | Status: DC
  Administered 2015-05-16 – 2015-05-17 (×2): 1 via ORAL
  Filled 2015-05-15 (×2): qty 1

## 2015-05-15 MED ORDER — BENZOCAINE-MENTHOL 20-0.5 % EX AERO: 1.0000 "application " | INHALATION_SPRAY | CUTANEOUS | Status: DC | PRN

## 2015-05-15 MED ORDER — DIPHENHYDRAMINE HCL 50 MG/ML IJ SOLN: 12.5000 mg | INTRAMUSCULAR | Status: DC | PRN

## 2015-05-15 MED ORDER — OXYCODONE HCL 5 MG PO TABS: 5.0000 mg | ORAL_TABLET | ORAL | Status: DC | PRN

## 2015-05-15 MED ORDER — BUPIVACAINE HCL (PF) 0.25 % IJ SOLN
INTRAMUSCULAR | Status: DC | PRN
  Administered 2015-05-15: 5 mL via EPIDURAL

## 2015-05-15 MED ORDER — LANOLIN HYDROUS EX OINT: TOPICAL_OINTMENT | CUTANEOUS | Status: DC | PRN

## 2015-05-15 MED ORDER — ONDANSETRON HCL 4 MG PO TABS: 4.0000 mg | ORAL_TABLET | ORAL | Status: DC | PRN

## 2015-05-15 MED ORDER — IBUPROFEN 600 MG PO TABS
600.0000 mg | ORAL_TABLET | Freq: Four times a day (QID) | ORAL | Status: DC
  Administered 2015-05-16 – 2015-05-17 (×7): 600 mg via ORAL
  Filled 2015-05-15 (×8): qty 1

## 2015-05-15 MED ORDER — EPHEDRINE 5 MG/ML INJ
10.0000 mg | INTRAVENOUS | Status: DC | PRN
  Filled 2015-05-15: qty 2

## 2015-05-15 MED ORDER — WITCH HAZEL-GLYCERIN EX PADS: 1.0000 "application " | MEDICATED_PAD | CUTANEOUS | Status: DC | PRN

## 2015-05-15 MED ORDER — SIMETHICONE 80 MG PO CHEW: 80.0000 mg | CHEWABLE_TABLET | ORAL | Status: DC | PRN

## 2015-05-15 MED ORDER — ONDANSETRON HCL 4 MG/2ML IJ SOLN: 4.0000 mg | INTRAMUSCULAR | Status: DC | PRN

## 2015-05-15 MED ORDER — DIBUCAINE 1 % RE OINT: 1.0000 "application " | TOPICAL_OINTMENT | RECTAL | Status: DC | PRN

## 2015-05-15 MED ORDER — BENZOCAINE-MENTHOL 20-0.5 % EX AERO
INHALATION_SPRAY | CUTANEOUS | Status: AC
  Administered 2015-05-15: 19:00:00
  Filled 2015-05-15: qty 56

## 2015-05-15 MED ORDER — LACTATED RINGERS IV SOLN: 500.0000 mL | Freq: Once | INTRAVENOUS | Status: DC

## 2015-05-15 MED ORDER — OXYTOCIN 40 UNITS IN LACTATED RINGERS INFUSION - SIMPLE MED
1.0000 m[IU]/min | INTRAVENOUS | Status: DC
  Administered 2015-05-15: 1 m[IU]/min via INTRAVENOUS

## 2015-05-15 MED ORDER — FENTANYL 2.5 MCG/ML W/ROPIVACAINE 0.2% IN NS 100 ML EPIDURAL INFUSION (ARMC-ANES): 10.0000 mL/h | EPIDURAL | Status: DC

## 2015-05-15 MED ORDER — FENTANYL 2.5 MCG/ML W/ROPIVACAINE 0.2% IN NS 100 ML EPIDURAL INFUSION (ARMC-ANES)
EPIDURAL | Status: AC
  Administered 2015-05-15: 10 mL/h via EPIDURAL
  Filled 2015-05-15: qty 100

## 2015-05-15 MED ORDER — DOCUSATE SODIUM 100 MG PO CAPS
100.0000 mg | ORAL_CAPSULE | Freq: Every day | ORAL | Status: DC
  Filled 2015-05-15 (×2): qty 1

## 2015-05-15 NOTE — Discharge Summary (Signed)
Physician Obstetric Discharge Summary  Patient ID: Chelsea Mathis MRN: [id number] DOB/AGE: 03-Sep-1990 25 y.o.   Date of Admission: 05/13/2015  Date of Discharge: 05/16/15  Admitting Diagnosis: Induction of labor at [redacted]w[redacted]d  Secondary Diagnosis: Gestational diabetes medication controlled (A2)  Mode of Delivery: normal spontaneous vaginal delivery 05/15/2015       Discharge Diagnosis: Term intrauterine pregnancy delivered   Intrapartum Procedures: Atificial rupture of membranes, epidural, GBS prophylaxis, pitocin augmentation, placement of intrauterine catheter and Cervidil ripening, Cytotec induction, repair of bilateral labial lacerations   Post partum procedures: N/A  Complications: none   Brief Hospital Course  Chelsea Mathis is a [id number] who had a SVD on 05/15/2015 following a 3 day induction of labor for GDMA2;  for further details of this delivery, please refer to the delivery note.  Patient had an uncomplicated postpartum course.  By time of discharge on PPD#1, her pain was controlled on oral pain medications; she had appropriate lochia and was ambulating, voiding without difficulty and tolerating regular diet.  She was deemed stable for discharge to home.     Labs: CBC Latest Ref Rng 05/13/2015  WBC 3.6 - 11.0 K/uL 13.7(H)  Hemoglobin 12.0 - 16.0 g/dL 11.6(L)  Hematocrit 35.0 - 47.0 % 35.9  Platelets 150 - 440 K/uL 308   O POS  Physical exam:  Blood pressure 112/65, pulse 109, temperature 98.5 F (36.9 C), temperature source Oral, resp. rate 16, height  (1.626 m), weight 101.606 kg (224 lb), last menstrual period 08/13/2014, SpO2 100 % General: alert and no distress Lochia: appropriate Abdomen: soft, NT Uterine Fundus: firm Extremities: No evidence of DVT seen on physical exam. BLE 1+ edema  Discharge Instructions:  Discharge instructions:   Call office if you have any of the following: headache, visual changes, fever >100 F, chills, breast concerns, excessive  vaginal bleeding, incision drainage or problems, leg pain or redness, depression or any other concerns.   Activity: Do not lift > 10 lbs for 6 weeks.  No intercourse or tampons for 6 weeks.  No driving for 1-2 weeks.   Activity: Advance as tolerated. Pelvic rest for 6 weeks.  Also refer to Discharge Instructions Diet: Regular Medications:   Medication List    STOP taking these medications        glyBURIDE 2.5 MG tablet  Commonly known as:  DIABETA      TAKE these medications        ibuprofen 600 MG tablet  Commonly known as:  ADVIL,MOTRIN  Take 1 tablet (600 mg total) by mouth every 6 (six) hours.     oxyCODONE 5 MG immediate release tablet  Commonly known as:  Oxy IR/ROXICODONE  Take 1 tablet (5 mg total) by mouth every 4 (four) hours as needed (pain scale 4-7).     prenatal multivitamin Tabs tablet  Take 1 tablet by mouth daily at 12 noon.       Outpatient follow up: 6 wk PP visit, paragard insertion and 2 hour glucose   Postpartum contraception: IUD  Discharged Condition: good  Discharged to: home   Newborn Data: Disposition:home with mother  Apgars: APGAR (1 MIN): 8   APGAR (5 MINS): 9   APGAR (10 MINS):    Baby Feeding: Bottle and Breast

## 2015-05-15 NOTE — Progress Notes (Signed)
L&D Progress Notes  Had AROM for clear fluid 3 hours ago and IUPC inserted at that time  S: Feeling some pressure  O:BP 132/76 mmHg  Pulse 81  Temp(Src) 98.5 F (36.9 C) (Oral)  Resp 16  Ht  (1.626 m)  Wt 101.606 kg (224 lb)  BMI 38.43 kg/m2  SpO2 100%  LMP 08/13/2014  Results for orders placed or performed during the hospital encounter of 05/13/15 (from the past 24 hour(s))  Glucose, capillary     Status: Abnormal   Collection Time: 05/14/15  6:42 PM  Result Value Ref Range   Glucose-Capillary 117 (H) 65 - 99 mg/dL  Glucose, capillary     Status: None   Collection Time: 05/14/15 10:36 PM  Result Value Ref Range   Glucose-Capillary 90 65 - 99 mg/dL   Comment 1 Document in Chart   Glucose, capillary     Status: None   Collection Time: 05/15/15 12:23 AM  Result Value Ref Range   Glucose-Capillary 72 65 - 99 mg/dL   Comment 1 Document in Chart   Glucose, capillary     Status: None   Collection Time: 05/15/15  2:26 AM  Result Value Ref Range   Glucose-Capillary 96 65 - 99 mg/dL  Glucose, capillary     Status: None   Collection Time: 05/15/15  4:57 AM  Result Value Ref Range   Glucose-Capillary 90 65 - 99 mg/dL   Comment 1 Document in Chart   Glucose, capillary     Status: None   Collection Time: 05/15/15  7:39 AM  Result Value Ref Range   Glucose-Capillary 86 65 - 99 mg/dL  Glucose, capillary     Status: Abnormal   Collection Time: 05/15/15  9:59 AM  Result Value Ref Range   Glucose-Capillary 107 (H) 65 - 99 mg/dL  Glucose, capillary     Status: Abnormal   Collection Time: 05/15/15 12:16 PM  Result Value Ref Range   Glucose-Capillary 123 (H) 65 - 99 mg/dL  Glucose, capillary     Status: None   Collection Time: 05/15/15  2:30 PM  Result Value Ref Range   Glucose-Capillary 85 65 - 99 mg/dL  Glucose, capillary     Status: None   Collection Time: 05/15/15  4:56 PM  Result Value Ref Range   Glucose-Capillary 70 65 - 99 mg/dL   FHR: baseline 409 with  accelerations to 150s with moderate variability and early decelerations IUPC: q3-5 min apart, ptiocin at 8 miu/min Cervix: 8/75%/0   A: Progressing Blood sugars acceptable  P: Monitor for start of second stage  Akshat Minehart, CNM

## 2015-05-15 NOTE — Progress Notes (Signed)
L&D Progress Note  S: Slept a little during the night. Contractions becoming a little stronger this Am after third dose of Cytotec at 5AM this morning. Rates contractions 5-6/10. Had a little bloody show  O: BP 111/71 mmHg  Pulse 87  Temp(Src) 98.1 F (36.7 C) (Oral)  Resp 16  Ht  (1.626 m)  Wt 101.606 kg (224 lb)  BMI 38.43 kg/m2  LMP 08/13/2014  Gen: appears in control FHR 140 BPM with accelerations to 160s and moderate variability Contractions q 3-5 min apart Cervix: 3.5-4cm/50-60%/ mid/ -1 to -2  A: IOL in process for GDMA2, some progress with Cytotec overnight  P: Can shower and eat lightly this AM before starting Pitocin augmentation Epidural when she desires Plan AROM after epidural.  Chelsea Mathis, CNM

## 2015-05-15 NOTE — Anesthesia Procedure Notes (Signed)
Epidural Patient location during procedure: OB  Staffing Anesthesiologist: Berdine Addison Performed by: anesthesiologist   Preanesthetic Checklist Completed: patient identified, site marked, surgical consent, pre-op evaluation, timeout performed, IV checked, risks and benefits discussed and monitors and equipment checked  Epidural Patient position: sitting Prep: Betadine Patient monitoring: heart rate, continuous pulse ox and blood pressure Approach: midline Location: L4-L5 Injection technique: LOR saline  Needle:  Needle type: Tuohy  Needle gauge: 18 G Needle length: 9 cm and 9 Catheter type: closed end flexible Catheter size: 20 Guage Test dose: negative and 1.5% lidocaine with Epi 1:200 K  Assessment Sensory level: T10 Events: blood not aspirated, injection not painful, no injection resistance, negative IV test and no paresthesia  Additional Notes   Patient tolerated the insertion well without complications. 1235 IN.  1252 Catheter insert. 1253 Test dose 3ml 1.5% lido with epi. 1258 Bolus. 1300 Infusion.Reason for block:procedure for pain

## 2015-05-15 NOTE — Anesthesia Preprocedure Evaluation (Addendum)
Anesthesia Evaluation  Patient identified by MRN, date of birth, ID band Patient awake    Reviewed: Allergy & Precautions, NPO status , Patient's Chart, lab work & pertinent test results, reviewed documented beta blocker date and time   Airway Mallampati: III  TM Distance: >3 FB     Dental  (+) Chipped   Pulmonary           Cardiovascular      Neuro/Psych    GI/Hepatic   Endo/Other  diabetes, Type 2  Renal/GU      Musculoskeletal   Abdominal   Peds  Hematology   Anesthesia Other Findings Obese.  Reproductive/Obstetrics                            Anesthesia Physical Anesthesia Plan  ASA: II  Anesthesia Plan: Epidural   Post-op Pain Management:    Induction:   Airway Management Planned:   Additional Equipment:   Intra-op Plan:   Post-operative Plan:   Informed Consent: I have reviewed the patients History and Physical, chart, labs and discussed the procedure including the risks, benefits and alternatives for the proposed anesthesia with the patient or authorized representative who has indicated his/her understanding and acceptance.     Plan Discussed with:   Anesthesia Plan Comments:         Anesthesia Quick Evaluation

## 2015-05-16 LAB — CBC
HEMATOCRIT: 33.4 % — AB (ref 35.0–47.0)
HEMOGLOBIN: 10.7 g/dL — AB (ref 12.0–16.0)
MCH: 25.7 pg — ABNORMAL LOW (ref 26.0–34.0)
MCHC: 32 g/dL (ref 32.0–36.0)
MCV: 80.3 fL (ref 80.0–100.0)
Platelets: 262 10*3/uL (ref 150–440)
RBC: 4.16 MIL/uL (ref 3.80–5.20)
RDW: 15.2 % — AB (ref 11.5–14.5)
WBC: 14.2 10*3/uL — AB (ref 3.6–11.0)

## 2015-05-16 MED ORDER — IBUPROFEN 600 MG PO TABS
600.0000 mg | ORAL_TABLET | Freq: Four times a day (QID) | ORAL | Status: DC
Start: 2015-05-16 — End: 2016-11-10

## 2015-05-16 MED ORDER — OXYCODONE HCL 5 MG PO TABS
5.0000 mg | ORAL_TABLET | ORAL | Status: DC | PRN
Start: 2015-05-16 — End: 2016-11-10

## 2015-05-16 NOTE — Anesthesia Postprocedure Evaluation (Signed)
Anesthesia Post Note  Patient: Chelsea Mathis  Procedure(s) Performed: * No procedures listed *  Patient location during evaluation: Mother Baby Anesthesia Type: Epidural Level of consciousness: awake and alert and oriented Pain management: satisfactory to patient Vital Signs Assessment: post-procedure vital signs reviewed and stable Respiratory status: respiratory function stable Cardiovascular status: stable Postop Assessment: no headache and no backache Anesthetic complications: no    Last Vitals:  Filed Vitals:   05/15/15 2120 05/16/15 0347  BP: 117/65 119/65  Pulse: 82 84  Temp: 36.9 C 36.6 C  Resp: 20 20    Last Pain:  Filed Vitals:   05/16/15 0524  PainSc: Thomasene Ripple

## 2015-05-16 NOTE — Anesthesia Post-op Follow-up Note (Signed)
  Anesthesia Pain Follow-up Note  Patient: Chelsea Mathis  Day #: 1  Date of Follow-up: 05/16/2015 Time: 7:10 AM  Last Vitals:  Filed Vitals:   05/15/15 2120 05/16/15 0347  BP: 117/65 119/65  Pulse: 82 84  Temp: 36.9 C 36.6 C  Resp: 20 20    Level of Consciousness: alert  Pain: mild   Side Effects:None  Catheter Site Exam:site not evaluated  Plan: D/C from anesthesia care  Clydene Pugh

## 2015-05-16 NOTE — Discharge Instructions (Signed)
Discharge instructions:  ° °Call office if you have any of the following: headache, visual changes, fever >100 F, chills, breast concerns, excessive vaginal bleeding, incision drainage or problems, leg pain or redness, depression or any other concerns.  ° °Activity: Do not lift > 10 lbs for 6 weeks.  °No intercourse or tampons for 6 weeks.  °No driving for 1-2 weeks.  ° °

## 2015-05-17 NOTE — Progress Notes (Signed)
Patient Varicella Non Immune but declines varicella vaccination. Discussed benefits with patient and significant other. Patient still declines.

## 2015-05-17 NOTE — Progress Notes (Addendum)
Post Partum Day 2/ addendum to discharge summary written 05/16/2015 Subjective: Received discharge instructions yesterday, but discharge held due to baby concerns. Baby on bili lights.   Objective: Blood pressure 128/68, pulse 68, temperature 97.8 F (36.6 C), temperature source Oral, resp. rate 18, height  (1.626 m), weight 101.606 kg (224 lb), last menstrual period 08/13/2014, SpO2 98 %, unknown if currently breastfeeding.  Physical Exam:  General: cooperative and no distress, resting comfortably Lochia: appropriate Uterine Fundus: firm DVT Evaluation: No evidence of DVT seen on physical exam.   Recent Labs  05/16/15 0546  HGB 10.7*  HCT 33.4*  WBC 14.2*  PLT 262    Assessment/Plan: Discharge home today on PPD #2 Discharge instructions reviewed Please refer to discharge summary RTO 6 weeks for Paraguard IUD, 6 wk pp check and 2hr GTT   LOS: 4 days   Sheketa Ende 05/17/2015, 12:06 PM

## 2015-05-17 NOTE — Discharge Summary (Signed)
Physician Obstetric Discharge Summary  Patient ID: Chelsea Mathis MRN: [id number] DOB/AGE: 10-29-1990 25 y.o.   Date of Admission: 05/13/2015  Date of Discharge: 05/17/2015  Admitting Diagnosis: Induction of labor at [redacted]w[redacted]d  Secondary Diagnosis: Gestational diabetes medication controlled (A2)  Mode of Delivery: normal spontaneous vaginal delivery 05/15/2015   Discharge Diagnosis: Term intrauterine pregnancy delivered  Intrapartum Procedures: Atificial rupture of membranes, epidural, GBS prophylaxis, pitocin augmentation, placement of intrauterine catheter and Cervidil ripening, Cytotec induction, repair of bilateral labial lacerations  Post partum procedures: N/A  Complications: none   Brief Hospital Course  Chelsea Mathis is a [id number] who had a SVD on 05/15/2015 following a 3 day induction of labor for GDMA2; for further details of this delivery, please refer to the delivery note. Patient had an uncomplicated postpartum course. By time of discharge on PPD#2 her pain was controlled on oral pain medications; she had appropriate lochia and was ambulating, voiding without difficulty and tolerating regular diet. She was deemed stable for discharge to home.    Labs:  Results for orders placed or performed during the hospital encounter of 05/13/15 (from the past 48 hour(s))  Glucose, capillary     Status: None   Collection Time: 05/15/15  2:30 PM  Result Value Ref Range   Glucose-Capillary 85 65 - 99 mg/dL  Glucose, capillary     Status: None   Collection Time: 05/15/15  4:56 PM  Result Value Ref Range   Glucose-Capillary 70 65 - 99 mg/dL  CBC     Status: Abnormal   Collection Time: 05/16/15  5:46 AM  Result Value Ref Range   WBC 14.2 (H) 3.6 - 11.0 K/uL   RBC 4.16 3.80 - 5.20 MIL/uL   Hemoglobin 10.7 (L) 12.0 - 16.0 g/dL   HCT 78.4 (L) 69.6 - 29.5 %   MCV 80.3 80.0 - 100.0 fL   MCH 25.7 (L) 26.0 - 34.0 pg   MCHC  32.0 32.0 - 36.0 g/dL   RDW 28.4 (H) 13.2 - 44.0 %   Platelets 262 150 - 440 K/uL   O POS  Physical exam:  BP 128/68 mmHg  Pulse 68  Temp(Src) 97.8 F (36.6 C) (Oral)  Resp 18  Ht  (1.626 m)  Wt 101.606 kg (224 lb)  BMI 38.43 kg/m2  SpO2 98%  LMP 08/13/2014  Breastfeeding? Unknown General: alert and no distress Lochia: appropriate Abdomen: soft, NT Uterine Fundus: firm Extremities: No evidence of DVT seen on physical exam. BLE 1+ edema  Discharge Instructions:  Discharge instructions:   Call office if you have any of the following: headache, visual changes, fever >100 F, chills, breast concerns, excessive vaginal bleeding, incision drainage or problems, leg pain or redness, depression or any other concerns.   Activity: Do not lift > 10 lbs for 6 weeks.  No intercourse or tampons for 6 weeks.  No driving for 1-2 weeks.   Activity: Advance as tolerated. Pelvic rest for 6 weeks. Also refer to Discharge Instructions Diet: Regular Medications:   Medication List    STOP taking these medications       glyBURIDE 2.5 MG tablet  Commonly known as: DIABETA      TAKE these medications       ibuprofen 600 MG tablet  Commonly known as: ADVIL,MOTRIN  Take 1 tablet (600 mg total) by mouth every 6 (six) hours.     oxyCODONE 5 MG immediate release tablet  Commonly known as: Oxy IR/ROXICODONE  Take 1 tablet (5 mg total)  by mouth every 4 (four) hours as needed (pain scale 4-7).     prenatal multivitamin Tabs tablet  Take 1 tablet by mouth daily at 12 noon.       Outpatient follow up: 6 wk PP visit, paragard insertion and 2 hour glucose   Postpartum contraception: IUD  Discharged Condition: good  Discharged to: home   Newborn Data: Disposition:home with mother  Apgars: APGAR (1 MIN): 8  APGAR (5 MINS): 9  APGAR (10 MINS):   Baby Feeding: Bottle and Breast          Cosigned by: Nadara Mustard, MD at  05/16/2015 7:47 PM  Revision History     Date/Time User Provider Type Action   05/16/2015 7:47 PM Nadara Mustard, MD Physician Cosign   05/16/2015 10:02 AM Marta Antu, CNM Certified Nurse Midwife Sign   05/15/2015 7:09 PM Farrel Conners, CNM Certified Nurse Midwife Share   View Details Report       Routing History     Date/Time From To Method   05/16/2015 7:47 PM Nadara Mustard, MD Marta Antu, CNM Fax   05/16/2015 7:47 PM Nadara Mustard, MD Vena Austria, MD Fax

## 2015-05-17 NOTE — Progress Notes (Signed)
Patient discharged home with infant. Discharge instructions, prescriptions and follow up appointment given to and reviewed with patient and significant other. Patient verbalized understanding. Escorted out via wheelchair by auxillary.

## 2016-11-10 ENCOUNTER — Emergency Department (HOSPITAL_COMMUNITY): Payer: Medicaid Other

## 2016-11-10 ENCOUNTER — Encounter (HOSPITAL_COMMUNITY): Payer: Self-pay | Admitting: Emergency Medicine

## 2016-11-10 ENCOUNTER — Inpatient Hospital Stay (HOSPITAL_COMMUNITY)
Admission: EM | Admit: 2016-11-10 | Discharge: 2016-11-12 | DRG: 343 | Disposition: A | Discharge status: Home / Self Care | Payer: Medicaid Other | Attending: Surgery | Admitting: Surgery

## 2016-11-10 DIAGNOSIS — K3580 Unspecified acute appendicitis: Secondary | ICD-10-CM | POA: Diagnosis present

## 2016-11-10 DIAGNOSIS — K353 Acute appendicitis with localized peritonitis, without perforation or gangrene: Secondary | ICD-10-CM

## 2016-11-10 DIAGNOSIS — Z975 Presence of (intrauterine) contraceptive device: Secondary | ICD-10-CM

## 2016-11-10 DIAGNOSIS — Z8632 Personal history of gestational diabetes: Secondary | ICD-10-CM

## 2016-11-10 LAB — COMPREHENSIVE METABOLIC PANEL
ALT: 13 U/L — ABNORMAL LOW (ref 14–54)
ANION GAP: 11 (ref 5–15)
AST: 23 U/L (ref 15–41)
Albumin: 3.9 g/dL (ref 3.5–5.0)
Alkaline Phosphatase: 92 U/L (ref 38–126)
BILIRUBIN TOTAL: 0.9 mg/dL (ref 0.3–1.2)
BUN: 5 mg/dL — AB (ref 6–20)
CO2: 23 mmol/L (ref 22–32)
Calcium: 9.1 mg/dL (ref 8.9–10.3)
Chloride: 103 mmol/L (ref 101–111)
Creatinine, Ser: 0.65 mg/dL (ref 0.44–1.00)
GFR calc Af Amer: >60 mL/min (ref >60)
Glucose, Bld: 179 mg/dL — ABNORMAL HIGH (ref 65–99)
POTASSIUM: 3.5 mmol/L (ref 3.5–5.1)
Sodium: 137 mmol/L (ref 135–145)
TOTAL PROTEIN: 7.4 g/dL (ref 6.5–8.1)

## 2016-11-10 LAB — URINALYSIS, MICROSCOPIC (REFLEX)

## 2016-11-10 LAB — URINALYSIS, ROUTINE W REFLEX MICROSCOPIC
BILIRUBIN URINE: NEGATIVE
Glucose, UA: NEGATIVE mg/dL
KETONES UR: NEGATIVE mg/dL
Nitrite: NEGATIVE
PROTEIN: 100 mg/dL — AB
Specific Gravity, Urine: 1.014 (ref 1.005–1.030)
pH: 6 (ref 5.0–8.0)

## 2016-11-10 LAB — CBC
HCT: 37.1 % (ref 36.0–46.0)
Hemoglobin: 12.5 g/dL (ref 12.0–15.0)
MCH: 26.7 pg (ref 26.0–34.0)
MCHC: 33.7 g/dL (ref 30.0–36.0)
MCV: 79.1 fL (ref 78.0–100.0)
Platelets: 391 10*3/uL (ref 150–400)
RBC: 4.69 MIL/uL (ref 3.87–5.11)
RDW: 12.6 % (ref 11.5–15.5)
WBC: 15.1 10*3/uL — AB (ref 4.0–10.5)

## 2016-11-10 LAB — PREGNANCY, URINE: PREG TEST UR: NEGATIVE

## 2016-11-10 LAB — LIPASE, BLOOD: Lipase: 26 U/L (ref 11–51)

## 2016-11-10 IMAGING — CT CT ABD-PELV W/ CM
2 of 4 series · 16 of 46 positions shown, 18 images · IV contrast (Omni 300)
Comparison: None.

CLINICAL DATA: Acute onset of epigastric abdominal pain. Nausea and
vomiting. Initial encounter.

EXAM:
CT ABDOMEN AND PELVIS WITH CONTRAST
TECHNIQUE: Multidetector CT imaging of the abdomen and pelvis was performed
using the standard protocol following bolus administration of
intravenous contrast.
CONTRAST:  100mL [3N] IOPAMIDOL ([3N]) INJECTION 61%

[Series 3: a/p w/ 5mm · axial · 0.98mm/px · z∈[+763,+1203]mm · 13 of 98 slices shown, 15 images]
[im 5/98  soft-tissue]
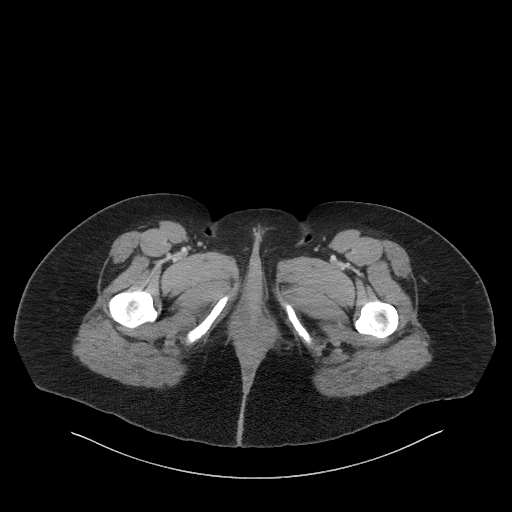
[im 5/98  bone]
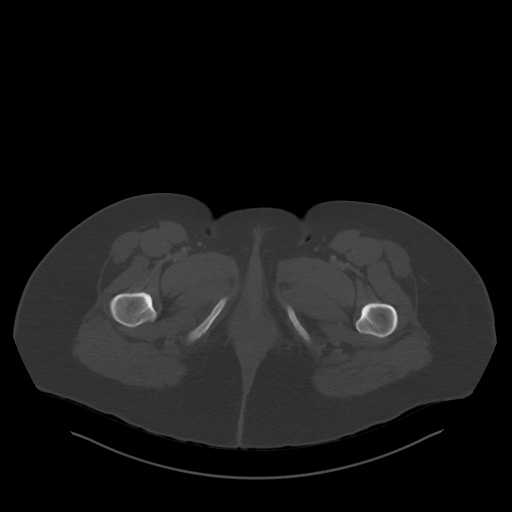
[im 13/98  soft-tissue]
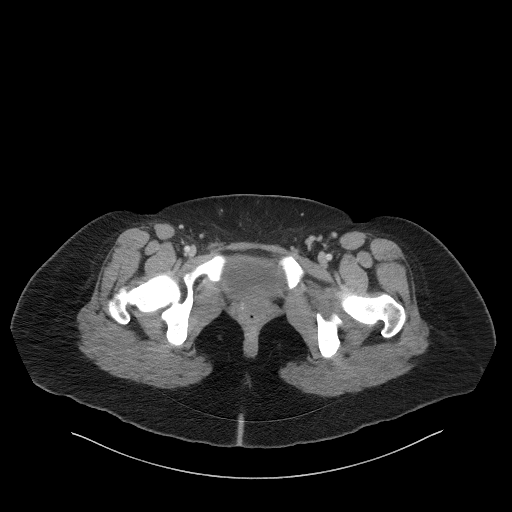
[im 22/98  soft-tissue]
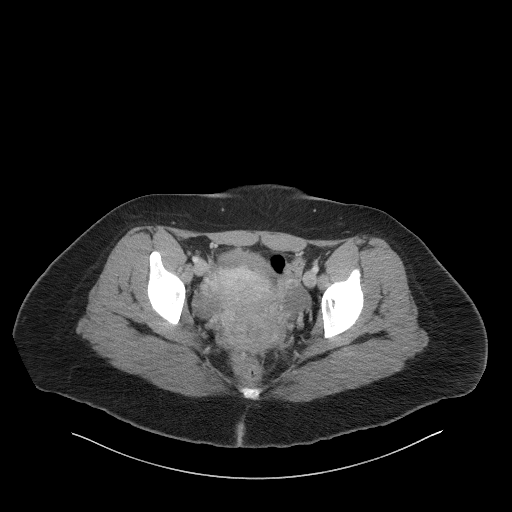
[im 26/98  soft-tissue]
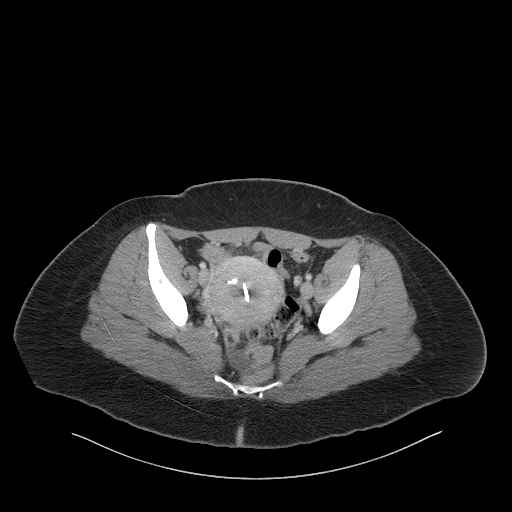
[im 34/98  soft-tissue]
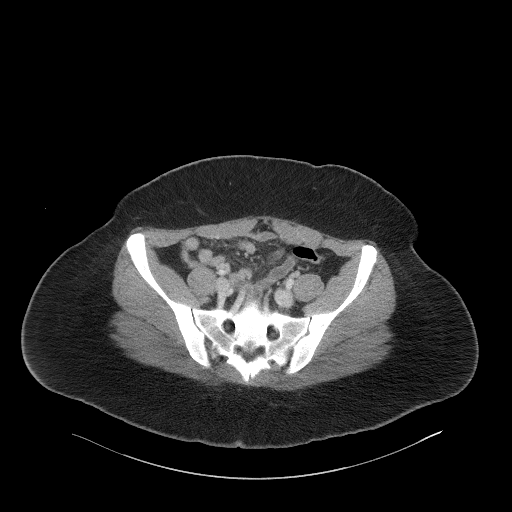
[im 43/98  soft-tissue]
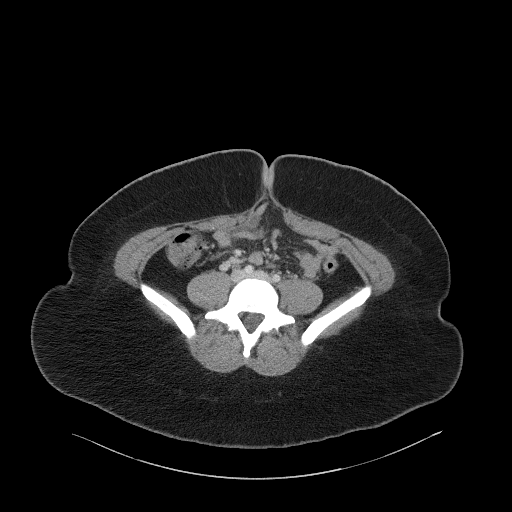
[im 51/98  soft-tissue]
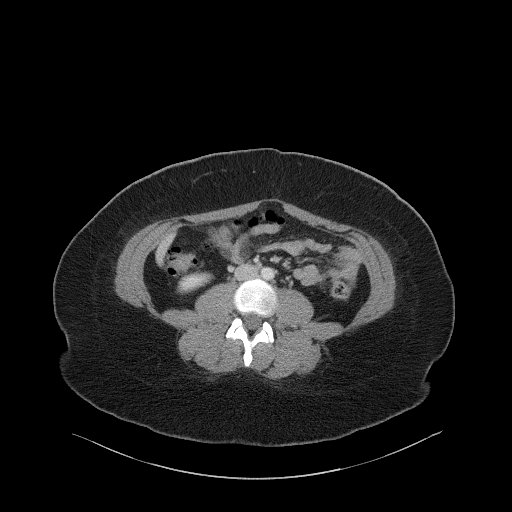
[im 55/98  soft-tissue]
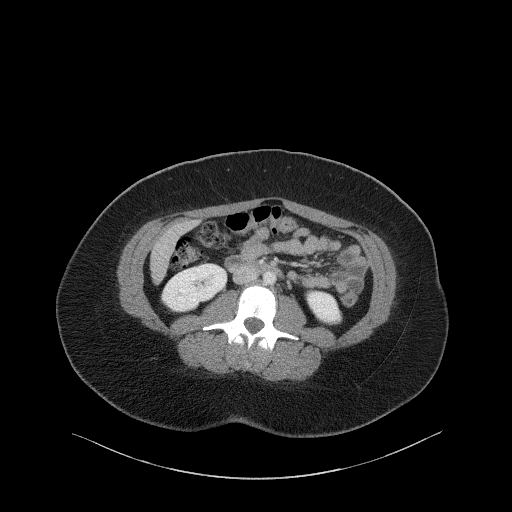
[im 64/98  soft-tissue]
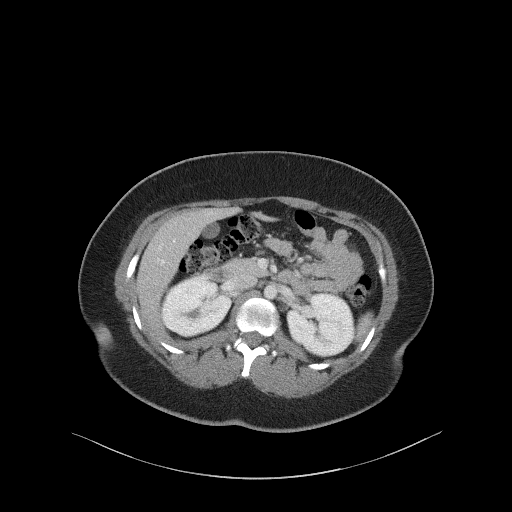
[im 64/98  bone]
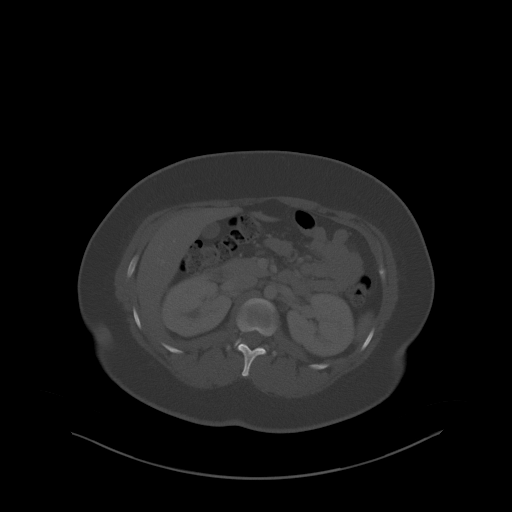
[im 72/98  soft-tissue]
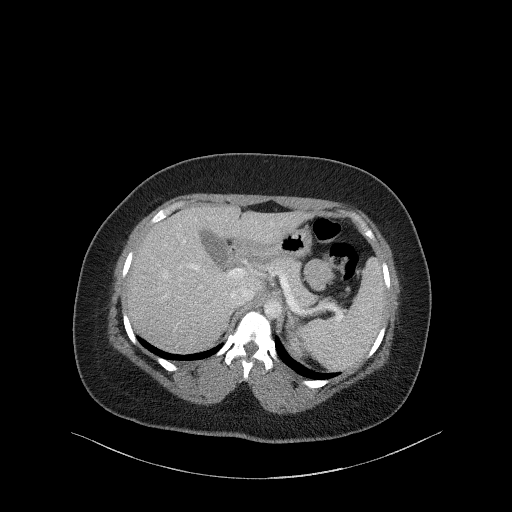
[im 76/98  soft-tissue]
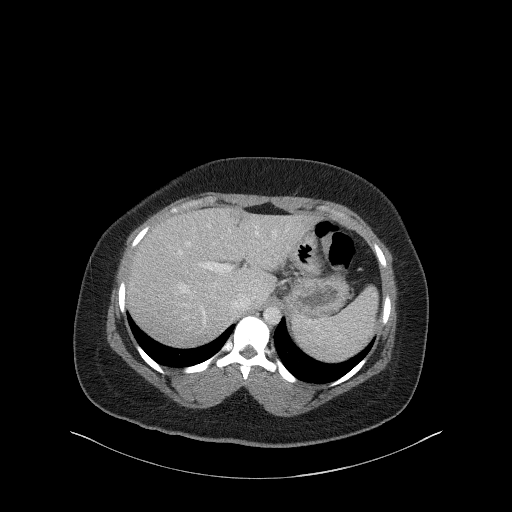
[im 85/98  soft-tissue]
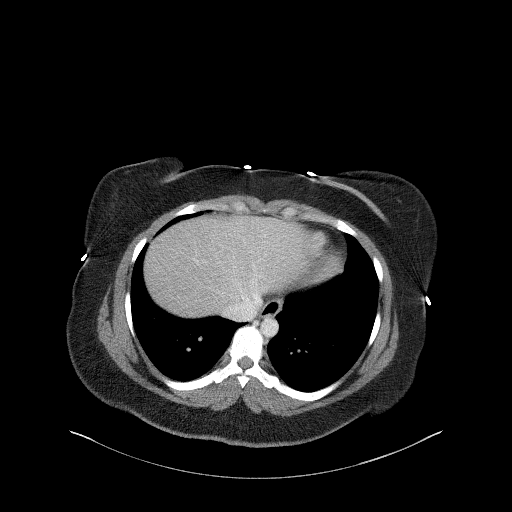
[im 93/98  soft-tissue]
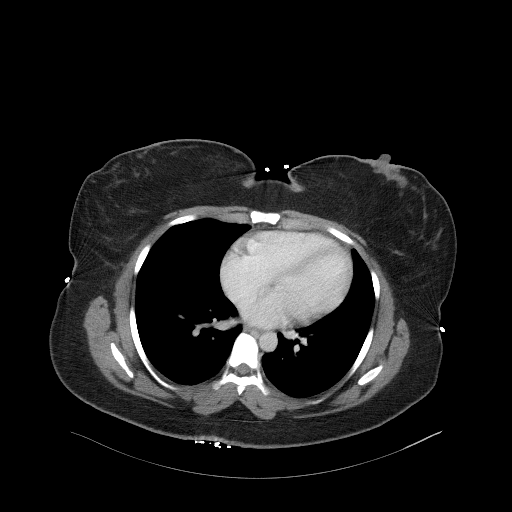

[Series 6: a/p w/ cor · coronal · 0.85mm/px · 3 of 166 slices shown]
[im 74/166  soft-tissue]
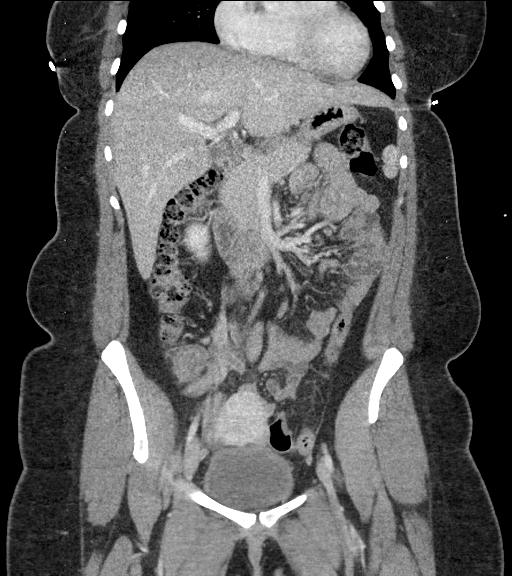
[im 92/166  soft-tissue]
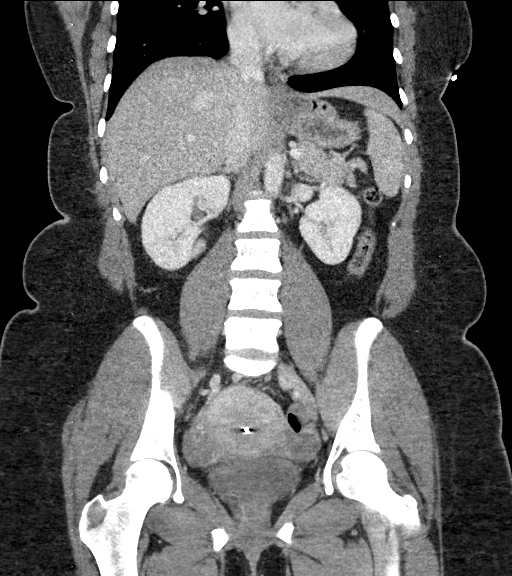
[im 111/166  soft-tissue]
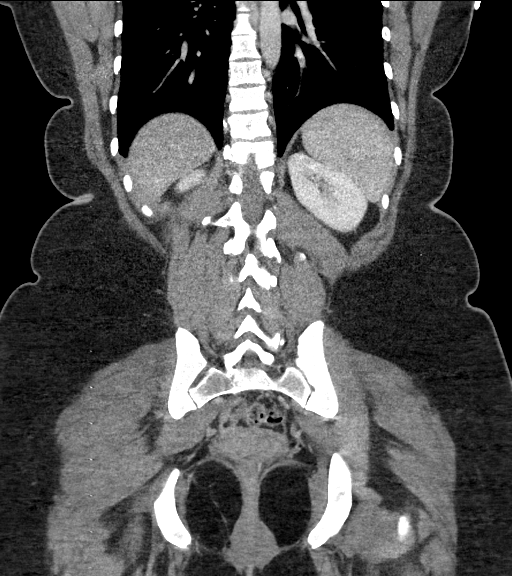

[16 of 46 positions shown; findings below may reference images not displayed]

FINDINGS: Lower chest: The visualized lung bases are grossly clear. The
visualized portions of the mediastinum are unremarkable.

Hepatobiliary: The liver is unremarkable in appearance. The
gallbladder is unremarkable in appearance. The common bile duct
remains normal in caliber.

Pancreas: The pancreas is within normal limits.

Spleen: The spleen is unremarkable in appearance.

Adrenals/Urinary Tract: The adrenal glands are unremarkable in
appearance. The kidneys are within normal limits. There is no
evidence of hydronephrosis. No renal or ureteral stones are
identified. No perinephric stranding is seen.

Stomach/Bowel: There is dilatation of the appendix to 1.2 cm in
diameter, with mild wall thickening and surrounding soft tissue
inflammation, compatible with acute appendicitis. There is no
evidence of perforation or abscess formation.

The colon is grossly unremarkable in appearance. A trace amount of
free fluid is seen within the pelvis. The small bowel is grossly
unremarkable. The stomach is unremarkable in appearance.

Vascular/Lymphatic: The abdominal aorta is unremarkable in
appearance. The inferior vena cava is grossly unremarkable. No
retroperitoneal lymphadenopathy is seen. No pelvic sidewall
lymphadenopathy is identified.

Reproductive: The bladder is mildly distended and grossly
unremarkable. The uterus is grossly unremarkable in appearance, with
an intrauterine device noted in expected position. The ovaries are
relatively symmetric. No suspicious adnexal masses are seen.

Other: No additional soft tissue abnormalities are seen.

Musculoskeletal: No acute osseous abnormalities are identified. The
visualized musculature is unremarkable in appearance.
IMPRESSION: Acute appendicitis, with dilatation of the appendix to 1.2 cm in
diameter, mild wall thickening and surrounding soft tissue
inflammation. No evidence of perforation or abscess formation. Trace
amount of associated free fluid within the pelvis.

These results were called by telephone at the time of interpretation
on [DATE] at [DATE] to JOSSIE PA, who verbally acknowledged
these results.

## 2016-11-10 MED ORDER — DEXTROSE 5 % IV SOLN
2.0000 g | Freq: Once | INTRAVENOUS | Status: AC
  Administered 2016-11-10: 2 g via INTRAVENOUS
  Filled 2016-11-10: qty 2

## 2016-11-10 MED ORDER — HYDROMORPHONE HCL 1 MG/ML IJ SOLN
1.0000 mg | INTRAMUSCULAR | Status: DC | PRN
  Administered 2016-11-11 (×2): 1 mg via INTRAVENOUS
  Filled 2016-11-10 (×2): qty 1

## 2016-11-10 MED ORDER — METRONIDAZOLE IN NACL 5-0.79 MG/ML-% IV SOLN
500.0000 mg | Freq: Once | INTRAVENOUS | Status: AC
  Administered 2016-11-10: 500 mg via INTRAVENOUS
  Filled 2016-11-10: qty 100

## 2016-11-10 MED ORDER — ONDANSETRON HCL 4 MG/2ML IJ SOLN
4.0000 mg | Freq: Four times a day (QID) | INTRAMUSCULAR | Status: DC | PRN
  Administered 2016-11-11: 4 mg via INTRAVENOUS

## 2016-11-10 MED ORDER — SODIUM CHLORIDE 0.9 % IV BOLUS (SEPSIS)
1000.0000 mL | Freq: Once | INTRAVENOUS | Status: AC
  Administered 2016-11-10: 1000 mL via INTRAVENOUS

## 2016-11-10 MED ORDER — ENOXAPARIN SODIUM 40 MG/0.4ML ~~LOC~~ SOLN
40.0000 mg | SUBCUTANEOUS | Status: DC
  Administered 2016-11-10 – 2016-11-11 (×2): 40 mg via SUBCUTANEOUS
  Filled 2016-11-10 (×3): qty 0.4

## 2016-11-10 MED ORDER — ONDANSETRON HCL 4 MG/2ML IJ SOLN
4.0000 mg | Freq: Once | INTRAMUSCULAR | Status: AC
  Administered 2016-11-10: 4 mg via INTRAVENOUS
  Filled 2016-11-10: qty 2

## 2016-11-10 MED ORDER — ONDANSETRON 4 MG PO TBDP: 4.0000 mg | ORAL_TABLET | Freq: Four times a day (QID) | ORAL | Status: DC | PRN

## 2016-11-10 MED ORDER — MORPHINE SULFATE (PF) 4 MG/ML IV SOLN
4.0000 mg | Freq: Once | INTRAVENOUS | Status: AC
  Administered 2016-11-10: 4 mg via INTRAVENOUS
  Filled 2016-11-10: qty 1

## 2016-11-10 MED ORDER — IOPAMIDOL (ISOVUE-300) INJECTION 61%
INTRAVENOUS | Status: AC
  Administered 2016-11-10: 100 mL via INTRAVENOUS
  Filled 2016-11-10: qty 100

## 2016-11-10 MED ORDER — POTASSIUM CHLORIDE IN NACL 20-0.9 MEQ/L-% IV SOLN
INTRAVENOUS | Status: DC
  Administered 2016-11-10 – 2016-11-11 (×2): via INTRAVENOUS
  Administered 2016-11-11: 150 mL/h via INTRAVENOUS
  Administered 2016-11-11 – 2016-11-12 (×2): via INTRAVENOUS
  Filled 2016-11-10 (×5): qty 1000

## 2016-11-10 MED ORDER — KETOROLAC TROMETHAMINE 30 MG/ML IJ SOLN
30.0000 mg | Freq: Once | INTRAMUSCULAR | Status: AC
  Administered 2016-11-11: 30 mg via INTRAVENOUS

## 2016-11-10 NOTE — ED Triage Notes (Signed)
Pt to ER with complaint of epigastric abd pain with nausea and vomiting x10 episodes in 24 hours. States pain began prior to vomiting. VSS. NAD. Denies diarrhea

## 2016-11-10 NOTE — ED Provider Notes (Signed)
[facility]-EMERGENCY DEPT Provider Note   CSN: [id number] Arrival date & time: 11/10/16  1723     History   Chief Complaint Chief Complaint  Patient presents with  . Abdominal Pain    HPI Chelsea Mathis is a 26 y.o. female who presents with one day of worsening abdominal pain.  Her abdominal pain began last night.  She Reports that shortly after her pain started she began to become nauseous, and vomited. She states that she has vomited at least 10 times today and that she is unable to keep anything down.  She reports that at this point her vomit is just "stomach contents" but that it is nonbloody. She reports that she began her period last night. She reports that she has an IUD in place. She denies any fevers or chills. Has had 3 bowel movements since the pain started all of which were well formed.    HPI  Past Medical History:  Diagnosis Date  . Gestational diabetes     Patient Active Problem List   Diagnosis Date Noted  . Postpartum care following vaginal delivery 05/13/2015  . Gestational diabetes mellitus (GDM) affecting pregnancy 05/13/2015    History reviewed. No pertinent surgical history.  OB History    Gravida Para Term Preterm AB Living   3 2 2   1 2    SAB TAB Ectopic Multiple Live Births         0 2       Home Medications    Prior to Admission medications   Medication Sig Start Date End Date Taking? Authorizing Provider  ibuprofen (ADVIL,MOTRIN) 600 MG tablet Take 1 tablet (600 mg total) by mouth every 6 (six) hours. 05/16/15   Marta AntuBrothers, Tamara, CNM  oxyCODONE (OXY IR/ROXICODONE) 5 MG immediate release tablet Take 1 tablet (5 mg total) by mouth every 4 (four) hours as needed (pain scale 4-7). 05/16/15   Marta AntuBrothers, Tamara, CNM  Prenatal Vit-Fe Fumarate-FA (PRENATAL MULTIVITAMIN) TABS tablet Take 1 tablet by mouth daily at 12 noon.    [provider]    Family History Family History  Problem Relation Age of Onset  . Diabetes Mother   . Hypertension  Mother   . Diabetes Maternal Uncle   . Diabetes Brother   . Stroke Maternal Grandmother   . Leukemia Paternal Grandmother   . Leukemia Paternal Grandfather     Social History Social History  Substance Use Topics  . Smoking status: Never Smoker  . Smokeless tobacco: Never Used  . Alcohol use No     Allergies   Patient has no known allergies.   Review of Systems Review of Systems  Constitutional: Positive for appetite change. Negative for chills and fever.  HENT: Negative for ear pain and sore throat.   Eyes: Negative for pain and visual disturbance.  Respiratory: Negative for cough, chest tightness and shortness of breath.   Cardiovascular: Negative for chest pain and palpitations.  Gastrointestinal: Positive for abdominal pain, nausea and vomiting. Negative for blood in stool, constipation and diarrhea.  Genitourinary: Positive for vaginal bleeding (Started menses yesterday). Negative for difficulty urinating, dysuria, flank pain, frequency, hematuria, pelvic pain, urgency, vaginal discharge and vaginal pain.  Musculoskeletal: Negative for arthralgias and back pain.  Skin: Negative for color change, rash and wound.  Neurological: Negative for seizures, syncope and headaches.  All other systems reviewed and are negative.    Physical Exam Updated Vital Signs BP 121/90   Pulse 75   Temp 98.5 F (36.9 C) (Oral)  Resp 18   LMP 11/10/2016 (Approximate)   SpO2 100%   Physical Exam  Constitutional: She appears well-developed and well-nourished. No distress.  HENT:  Head: Normocephalic and atraumatic.  Eyes: Conjunctivae are normal. No scleral icterus.  Neck: Neck supple. No JVD present.  Cardiovascular: Normal rate, regular rhythm, normal heart sounds and intact distal pulses.   No murmur heard. Pulmonary/Chest: Effort normal and breath sounds normal. No respiratory distress. She has no wheezes.  Abdominal: Soft. Normal appearance and bowel sounds are normal. She  exhibits no distension and no mass. There is no hepatosplenomegaly. There is tenderness in the right upper quadrant, epigastric area and left lower quadrant. There is no rebound, no guarding, no CVA tenderness, no tenderness at McBurney's point and negative Murphy's sign.  Musculoskeletal: She exhibits no edema.  Lymphadenopathy:    She has no cervical adenopathy.  Neurological: She is alert. No sensory deficit. She exhibits normal muscle tone.  Skin: Skin is warm and dry.  Psychiatric: She has a normal mood and affect. Her behavior is normal.  Nursing note and vitals reviewed.    ED Treatments / Results  Labs (all labs ordered are listed, but only abnormal results are displayed) Labs Reviewed  COMPREHENSIVE METABOLIC PANEL - Abnormal; Notable for the following:       Result Value   Glucose, Bld 179 (*)    BUN 5 (*)    ALT 13 (*)    All other components within normal limits  CBC - Abnormal; Notable for the following:    WBC 15.1 (*)    All other components within normal limits  URINALYSIS, ROUTINE W REFLEX MICROSCOPIC - Abnormal; Notable for the following:    Color, Urine RED (*)    APPearance HAZY (*)    Hgb urine dipstick LARGE (*)    Protein, ur 100 (*)    Leukocytes, UA SMALL (*)    All other components within normal limits  URINALYSIS, MICROSCOPIC (REFLEX) - Abnormal; Notable for the following:    Bacteria, UA MANY (*)    Squamous Epithelial / LPF 6-30 (*)    All other components within normal limits  URINE CULTURE  LIPASE, BLOOD  PREGNANCY, URINE    EKG  EKG Interpretation None       Radiology No results found.  Procedures Procedures (including critical care time)  Medications Ordered in ED Medications  sodium chloride 0.9 % bolus 1,000 mL (1,000 mLs Intravenous New Bag/Given 11/10/16 1955)  ondansetron (ZOFRAN) injection 4 mg (4 mg Intravenous Given 11/10/16 1955)     Initial Impression / Assessment and Plan / ED Course  I have reviewed the triage  vital signs and the nursing notes.  Pertinent labs & imaging results that were available during my care of the patient were reviewed by me and considered in my medical decision making (see chart for details).    Chelsea Mathis presents with one day of abdominal pain with nausea and vomiting.  She was given fluid bolus, zofran in the ED.  Labs were reviewed and CT abdomen pelvis was ordered.  She was seen by Dr. Madilyn Hook who evaluated her.    At shift change care was transferred to  Dr. Madilyn Hook who will follow pending studies, re-evaulate and determine disposition.     Final Clinical Impressions(s) / ED Diagnoses   Final diagnoses:  None    New Prescriptions New Prescriptions   No medications on file     Cristina Gong, Cordelia Poche 11/10/16 2045  Norman Clay 11/10/16 2047    Tilden Fossa, MD 11/10/16 2352

## 2016-11-10 NOTE — H&P (Signed)
Chelsea Mathis is an 26 y.o. female.   Chief Complaint: Right lower quadrant abdominal pain HPI: This is a pleasant 26 year old female presents with a 24-hour history of abdominal pain. It started in the epigastrium and then moved to the right lower quadrant. She has had nausea and vomiting. She is otherwise without complaints. She presented to the emergency department. She had a CT scan showing acute appendicitis. She denies fevers or chills. She describes sharp, constant pain which is moderate in intensity.  Past Medical History:  Diagnosis Date  . Gestational diabetes     History reviewed. No pertinent surgical history.  Family History  Problem Relation Age of Onset  . Diabetes Mother   . Hypertension Mother   . Diabetes Maternal Uncle   . Diabetes Brother   . Stroke Maternal Grandmother   . Leukemia Paternal Grandmother   . Leukemia Paternal Grandfather    Social History:  reports that she has never smoked. She has never used smokeless tobacco. She reports that she does not drink alcohol or use drugs.  Allergies: No Known Allergies   (Not in a hospital admission)  Results for orders placed or performed during the hospital encounter of 11/10/16 (from the past 48 hour(s))  Lipase, blood     Status: None   Collection Time: 11/10/16  6:05 PM  Result Value Ref Range   Lipase 26 11 - 51 U/L  Comprehensive metabolic panel     Status: Abnormal   Collection Time: 11/10/16  6:05 PM  Result Value Ref Range   Sodium 137 135 - 145 mmol/L   Potassium 3.5 3.5 - 5.1 mmol/L   Chloride 103 101 - 111 mmol/L   CO2 23 22 - 32 mmol/L   Glucose, Bld 179 (H) 65 - 99 mg/dL   BUN 5 (L) 6 - 20 mg/dL   Creatinine, Ser 0.65 0.44 - 1.00 mg/dL   Calcium 9.1 8.9 - 10.3 mg/dL   Total Protein 7.4 6.5 - 8.1 g/dL   Albumin 3.9 3.5 - 5.0 g/dL   AST 23 15 - 41 U/L   ALT 13 (L) 14 - 54 U/L   Alkaline Phosphatase 92 38 - 126 U/L   Total Bilirubin 0.9 0.3 - 1.2 mg/dL   GFR calc non Af Amer >60 >60  mL/min   GFR calc Af Amer >60 >60 mL/min    Comment: (NOTE) The eGFR has been calculated using the CKD EPI equation. This calculation has not been validated in all clinical situations. eGFR's persistently <60 mL/min signify possible Chronic Kidney Disease.    Anion gap 11 5 - 15  CBC     Status: Abnormal   Collection Time: 11/10/16  6:05 PM  Result Value Ref Range   WBC 15.1 (H) 4.0 - 10.5 K/uL   RBC 4.69 3.87 - 5.11 MIL/uL   Hemoglobin 12.5 12.0 - 15.0 g/dL   HCT 37.1 36.0 - 46.0 %   MCV 79.1 78.0 - 100.0 fL   MCH 26.7 26.0 - 34.0 pg   MCHC 33.7 30.0 - 36.0 g/dL   RDW 12.6 11.5 - 15.5 %   Platelets 391 150 - 400 K/uL  Urinalysis, Routine w reflex microscopic     Status: Abnormal   Collection Time: 11/10/16  6:07 PM  Result Value Ref Range   Color, Urine RED (A) YELLOW    Comment: BIOCHEMICALS MAY BE AFFECTED BY COLOR   APPearance HAZY (A) CLEAR   Specific Gravity, Urine 1.014 1.005 - 1.030  pH 6.0 5.0 - 8.0   Glucose, UA NEGATIVE NEGATIVE mg/dL   Hgb urine dipstick LARGE (A) NEGATIVE   Bilirubin Urine NEGATIVE NEGATIVE   Ketones, ur NEGATIVE NEGATIVE mg/dL   Protein, ur 100 (A) NEGATIVE mg/dL   Nitrite NEGATIVE NEGATIVE   Leukocytes, UA SMALL (A) NEGATIVE  Pregnancy, urine     Status: None   Collection Time: 11/10/16  6:07 PM  Result Value Ref Range   Preg Test, Ur NEGATIVE NEGATIVE    Comment:        THE SENSITIVITY OF THIS METHODOLOGY IS >20 mIU/mL.   Urinalysis, Microscopic (reflex)     Status: Abnormal   Collection Time: 11/10/16  6:07 PM  Result Value Ref Range   RBC / HPF TOO NUMEROUS TO COUNT 0 - 5 RBC/hpf   WBC, UA 6-30 0 - 5 WBC/hpf   Bacteria, UA MANY (A) NONE SEEN   Squamous Epithelial / LPF 6-30 (A) NONE SEEN   Mucous PRESENT    Ct Abdomen Pelvis W Contrast  Result Date: 11/10/2016 CLINICAL DATA:  Acute onset of epigastric abdominal pain. Nausea and vomiting. Initial encounter. EXAM: CT ABDOMEN AND PELVIS WITH CONTRAST TECHNIQUE: Multidetector CT  imaging of the abdomen and pelvis was performed using the standard protocol following bolus administration of intravenous contrast. CONTRAST:  176m ISOVUE-300 IOPAMIDOL (ISOVUE-300) INJECTION 61% COMPARISON:  None. FINDINGS: Lower chest: The visualized lung bases are grossly clear. The visualized portions of the mediastinum are unremarkable. Hepatobiliary: The liver is unremarkable in appearance. The gallbladder is unremarkable in appearance. The common bile duct remains normal in caliber. Pancreas: The pancreas is within normal limits. Spleen: The spleen is unremarkable in appearance. Adrenals/Urinary Tract: The adrenal glands are unremarkable in appearance. The kidneys are within normal limits. There is no evidence of hydronephrosis. No renal or ureteral stones are identified. No perinephric stranding is seen. Stomach/Bowel: There is dilatation of the appendix to 1.2 cm in diameter, with mild wall thickening and surrounding soft tissue inflammation, compatible with acute appendicitis. There is no evidence of perforation or abscess formation. The colon is grossly unremarkable in appearance. A trace amount of free fluid is seen within the pelvis. The small bowel is grossly unremarkable. The stomach is unremarkable in appearance. Vascular/Lymphatic: The abdominal aorta is unremarkable in appearance. The inferior vena cava is grossly unremarkable. No retroperitoneal lymphadenopathy is seen. No pelvic sidewall lymphadenopathy is identified. Reproductive: The bladder is mildly distended and grossly unremarkable. The uterus is grossly unremarkable in appearance, with an intrauterine device noted in expected position. The ovaries are relatively symmetric. No suspicious adnexal masses are seen. Other: No additional soft tissue abnormalities are seen. Musculoskeletal: No acute osseous abnormalities are identified. The visualized musculature is unremarkable in appearance. IMPRESSION: Acute appendicitis, with dilatation of  the appendix to 1.2 cm in diameter, mild wall thickening and surrounding soft tissue inflammation. No evidence of perforation or abscess formation. Trace amount of associated free fluid within the pelvis. These results were called by telephone at the time of interpretation on 11/10/2016 at 9:36 pm to TShary DecampPA, who verbally acknowledged these results. Electronically Signed   By: JGarald BaldingM.D.   On: 11/10/2016 21:37    Review of Systems  Constitutional: Negative for chills and fever.  Respiratory: Negative for cough and shortness of breath.   Cardiovascular: Negative for chest pain.  Gastrointestinal: Positive for abdominal pain, nausea and vomiting.  Genitourinary: Negative for dysuria.    Blood pressure 121/90, pulse 75, temperature 98.5 F (36.9  C), temperature source Oral, resp. rate 18, last menstrual period 11/10/2016, SpO2 100 %, unknown if currently breastfeeding. Physical Exam  Constitutional: She is oriented to person, place, and time. She appears well-developed and well-nourished. No distress.  HENT:  Head: Normocephalic and atraumatic.  Right Ear: External ear normal.  Left Ear: External ear normal.  Nose: Nose normal.  Mouth/Throat: No oropharyngeal exudate.  Eyes: Pupils are equal, round, and reactive to light. Conjunctivae are normal. Right eye exhibits no discharge. Left eye exhibits no discharge. No scleral icterus.  Neck: Normal range of motion. No tracheal deviation present.  Cardiovascular: Normal rate, regular rhythm, normal heart sounds and intact distal pulses.   No murmur heard. Respiratory: Effort normal and breath sounds normal. No respiratory distress. She exhibits no tenderness.  GI: Soft. There is tenderness. There is guarding.  Tenderness with guarding in the right lower quadrant  Musculoskeletal: Normal range of motion. She exhibits no edema or tenderness.  Lymphadenopathy:    She has no cervical adenopathy.  Neurological: She is alert and  oriented to person, place, and time.  Skin: Skin is warm and dry. She is not diaphoretic. No erythema.  Psychiatric: Her behavior is normal. Judgment normal.     Assessment/Plan Acute appendicitis  I discussed the diagnosis with patient. IV antibiotics have been given. She is being admitted for IV hydration and laparoscopic appendectomy which will be performed in the morning. I discussed the surgical procedure with her including the risks. She agrees with the plan.  Harl Bowie, MD 11/10/2016, 10:49 PM

## 2016-11-10 NOTE — ED Notes (Signed)
Attempted report x1. 

## 2016-11-11 ENCOUNTER — Observation Stay (HOSPITAL_COMMUNITY): Payer: Medicaid Other | Admitting: Certified Registered"

## 2016-11-11 ENCOUNTER — Encounter (HOSPITAL_COMMUNITY): Admission: EM | Disposition: A | Discharge status: Home / Self Care | Payer: Self-pay | Source: Home / Self Care

## 2016-11-11 DIAGNOSIS — Z8632 Personal history of gestational diabetes: Secondary | ICD-10-CM | POA: Diagnosis not present

## 2016-11-11 DIAGNOSIS — K3580 Unspecified acute appendicitis: Secondary | ICD-10-CM | POA: Diagnosis present

## 2016-11-11 DIAGNOSIS — Z975 Presence of (intrauterine) contraceptive device: Secondary | ICD-10-CM | POA: Diagnosis not present

## 2016-11-11 HISTORY — PX: LAPAROSCOPIC APPENDECTOMY: SHX408

## 2016-11-11 LAB — HIV ANTIBODY (ROUTINE TESTING W REFLEX): HIV SCREEN 4TH GENERATION: NONREACTIVE

## 2016-11-11 LAB — SURGICAL PCR SCREEN
MRSA, PCR: NEGATIVE
Staphylococcus aureus: NEGATIVE

## 2016-11-11 SURGERY — APPENDECTOMY, LAPAROSCOPIC
Anesthesia: General | Site: Abdomen

## 2016-11-11 MED ORDER — PROMETHAZINE HCL 25 MG/ML IJ SOLN: 6.2500 mg | INTRAMUSCULAR | Status: DC | PRN

## 2016-11-11 MED ORDER — PROPOFOL 10 MG/ML IV BOLUS
INTRAVENOUS | Status: AC
  Filled 2016-11-11: qty 20

## 2016-11-11 MED ORDER — MIDAZOLAM HCL 2 MG/2ML IJ SOLN
INTRAMUSCULAR | Status: AC
  Filled 2016-11-11: qty 2

## 2016-11-11 MED ORDER — MIDAZOLAM HCL 5 MG/5ML IJ SOLN
INTRAMUSCULAR | Status: DC | PRN
  Administered 2016-11-11: 2 mg via INTRAVENOUS

## 2016-11-11 MED ORDER — ROCURONIUM BROMIDE 10 MG/ML (PF) SYRINGE
PREFILLED_SYRINGE | INTRAVENOUS | Status: AC
  Filled 2016-11-11: qty 5

## 2016-11-11 MED ORDER — SUCCINYLCHOLINE CHLORIDE 200 MG/10ML IV SOSY
PREFILLED_SYRINGE | INTRAVENOUS | Status: AC
  Filled 2016-11-11: qty 10

## 2016-11-11 MED ORDER — SODIUM CHLORIDE 0.9 % IR SOLN
Status: DC | PRN
  Administered 2016-11-11: 1000 mL

## 2016-11-11 MED ORDER — PROPOFOL 10 MG/ML IV BOLUS
INTRAVENOUS | Status: DC | PRN
  Administered 2016-11-11: 150 mg via INTRAVENOUS

## 2016-11-11 MED ORDER — BUPIVACAINE-EPINEPHRINE 0.25% -1:200000 IJ SOLN
INTRAMUSCULAR | Status: DC | PRN
  Administered 2016-11-11: 17 mL

## 2016-11-11 MED ORDER — SUGAMMADEX SODIUM 200 MG/2ML IV SOLN
INTRAVENOUS | Status: AC
  Filled 2016-11-11: qty 2

## 2016-11-11 MED ORDER — BUPIVACAINE-EPINEPHRINE (PF) 0.25% -1:200000 IJ SOLN
INTRAMUSCULAR | Status: AC
  Filled 2016-11-11: qty 30

## 2016-11-11 MED ORDER — DEXAMETHASONE SODIUM PHOSPHATE 10 MG/ML IJ SOLN
INTRAMUSCULAR | Status: DC | PRN
  Administered 2016-11-11: 10 mg via INTRAVENOUS

## 2016-11-11 MED ORDER — METRONIDAZOLE IN NACL 5-0.79 MG/ML-% IV SOLN
500.0000 mg | INTRAVENOUS | Status: AC
  Administered 2016-11-11: 500 mg via INTRAVENOUS
  Filled 2016-11-11: qty 100

## 2016-11-11 MED ORDER — KETOROLAC TROMETHAMINE 30 MG/ML IJ SOLN
INTRAMUSCULAR | Status: AC
  Filled 2016-11-11: qty 1

## 2016-11-11 MED ORDER — SUGAMMADEX SODIUM 200 MG/2ML IV SOLN
INTRAVENOUS | Status: DC | PRN
  Administered 2016-11-11: 200 mg via INTRAVENOUS

## 2016-11-11 MED ORDER — FENTANYL CITRATE (PF) 100 MCG/2ML IJ SOLN
25.0000 ug | INTRAMUSCULAR | Status: DC | PRN
  Administered 2016-11-11: 50 ug via INTRAVENOUS

## 2016-11-11 MED ORDER — DEXAMETHASONE SODIUM PHOSPHATE 10 MG/ML IJ SOLN
INTRAMUSCULAR | Status: AC
  Filled 2016-11-11: qty 1

## 2016-11-11 MED ORDER — LIDOCAINE HCL (CARDIAC) 20 MG/ML IV SOLN
INTRAVENOUS | Status: DC | PRN
  Administered 2016-11-11: 80 mg via INTRAVENOUS

## 2016-11-11 MED ORDER — 0.9 % SODIUM CHLORIDE (POUR BTL) OPTIME
TOPICAL | Status: DC | PRN
  Administered 2016-11-11: 1000 mL

## 2016-11-11 MED ORDER — FENTANYL CITRATE (PF) 250 MCG/5ML IJ SOLN
INTRAMUSCULAR | Status: AC
  Filled 2016-11-11: qty 5

## 2016-11-11 MED ORDER — LIDOCAINE 2% (20 MG/ML) 5 ML SYRINGE
INTRAMUSCULAR | Status: AC
  Filled 2016-11-11: qty 5

## 2016-11-11 MED ORDER — OXYCODONE HCL 5 MG PO TABS
5.0000 mg | ORAL_TABLET | ORAL | Status: DC | PRN
  Administered 2016-11-11 (×2): 5 mg via ORAL
  Administered 2016-11-12: 10 mg via ORAL
  Filled 2016-11-11: qty 2
  Filled 2016-11-11 (×2): qty 1

## 2016-11-11 MED ORDER — LACTATED RINGERS IV SOLN
INTRAVENOUS | Status: DC | PRN
  Administered 2016-11-11: 08:00:00 via INTRAVENOUS

## 2016-11-11 MED ORDER — PHENOL 1.4 % MT LIQD
1.0000 | OROMUCOSAL | Status: DC | PRN
  Administered 2016-11-11: 1 via OROMUCOSAL
  Filled 2016-11-11: qty 177

## 2016-11-11 MED ORDER — FENTANYL CITRATE (PF) 100 MCG/2ML IJ SOLN
INTRAMUSCULAR | Status: AC
  Filled 2016-11-11: qty 2

## 2016-11-11 MED ORDER — ROCURONIUM BROMIDE 100 MG/10ML IV SOLN
INTRAVENOUS | Status: DC | PRN
  Administered 2016-11-11: 30 mg via INTRAVENOUS

## 2016-11-11 MED ORDER — FENTANYL CITRATE (PF) 100 MCG/2ML IJ SOLN
INTRAMUSCULAR | Status: DC | PRN
  Administered 2016-11-11: 100 ug via INTRAVENOUS

## 2016-11-11 MED ORDER — GLYCOPYRROLATE 0.2 MG/ML IV SOSY
PREFILLED_SYRINGE | INTRAVENOUS | Status: DC | PRN
  Administered 2016-11-11: .2 mg via INTRAVENOUS

## 2016-11-11 MED ORDER — ONDANSETRON HCL 4 MG/2ML IJ SOLN
INTRAMUSCULAR | Status: AC
  Filled 2016-11-11: qty 2

## 2016-11-11 MED ORDER — SUCCINYLCHOLINE CHLORIDE 20 MG/ML IJ SOLN
INTRAMUSCULAR | Status: DC | PRN
  Administered 2016-11-11: 100 mg via INTRAVENOUS

## 2016-11-11 SURGICAL SUPPLY — 39 items
APPLIER CLIP ROT 10 11.4 M/L (STAPLE)
BLADE CLIPPER SURG (BLADE) IMPLANT
CANISTER SUCT 3000ML PPV (MISCELLANEOUS) ×3 IMPLANT
CHLORAPREP W/TINT 26ML (MISCELLANEOUS) ×3 IMPLANT
CLIP APPLIE ROT 10 11.4 M/L (STAPLE) IMPLANT
COVER SURGICAL LIGHT HANDLE (MISCELLANEOUS) ×3 IMPLANT
CUTTER FLEX LINEAR 45M (STAPLE) ×3 IMPLANT
DERMABOND ADVANCED (GAUZE/BANDAGES/DRESSINGS) ×2
DERMABOND ADVANCED .7 DNX12 (GAUZE/BANDAGES/DRESSINGS) ×1 IMPLANT
ELECT REM PT RETURN 9FT ADLT (ELECTROSURGICAL) ×3
ELECTRODE REM PT RTRN 9FT ADLT (ELECTROSURGICAL) ×1 IMPLANT
GLOVE BIO SURGEON STRL SZ8 (GLOVE) ×3 IMPLANT
GLOVE BIOGEL PI IND STRL 8 (GLOVE) ×1 IMPLANT
GLOVE BIOGEL PI INDICATOR 8 (GLOVE) ×2
GOWN STRL REUS W/ TWL LRG LVL3 (GOWN DISPOSABLE) ×1 IMPLANT
GOWN STRL REUS W/ TWL XL LVL3 (GOWN DISPOSABLE) ×1 IMPLANT
GOWN STRL REUS W/TWL LRG LVL3 (GOWN DISPOSABLE) ×2
GOWN STRL REUS W/TWL XL LVL3 (GOWN DISPOSABLE) ×2
KIT BASIN OR (CUSTOM PROCEDURE TRAY) ×3 IMPLANT
KIT ROOM TURNOVER OR (KITS) ×3 IMPLANT
NEEDLE 22X1 1/2 (OR ONLY) (NEEDLE) ×3 IMPLANT
NS IRRIG 1000ML POUR BTL (IV SOLUTION) ×3 IMPLANT
PAD ARMBOARD 7.5X6 YLW CONV (MISCELLANEOUS) ×6 IMPLANT
POUCH SPECIMEN RETRIEVAL 10MM (ENDOMECHANICALS) ×3 IMPLANT
RELOAD 45 VASCULAR/THIN (ENDOMECHANICALS) ×3 IMPLANT
RELOAD STAPLE TA45 3.5 REG BLU (ENDOMECHANICALS) IMPLANT
SCISSORS LAP 5X35 DISP (ENDOMECHANICALS) ×3 IMPLANT
SET IRRIG TUBING LAPAROSCOPIC (IRRIGATION / IRRIGATOR) ×3 IMPLANT
SHEARS HARMONIC ACE PLUS 36CM (ENDOMECHANICALS) ×3 IMPLANT
SPECIMEN JAR SMALL (MISCELLANEOUS) ×3 IMPLANT
SUT VIC AB 4-0 PS2 27 (SUTURE) ×3 IMPLANT
TOWEL OR 17X24 6PK STRL BLUE (TOWEL DISPOSABLE) IMPLANT
TOWEL OR 17X26 10 PK STRL BLUE (TOWEL DISPOSABLE) ×3 IMPLANT
TRAY FOLEY CATH SILVER 16FR (SET/KITS/TRAYS/PACK) IMPLANT
TRAY LAPAROSCOPIC MC (CUSTOM PROCEDURE TRAY) ×3 IMPLANT
TROCAR XCEL 12X100 BLDLESS (ENDOMECHANICALS) ×3 IMPLANT
TROCAR XCEL BLUNT TIP 100MML (ENDOMECHANICALS) ×3 IMPLANT
TROCAR XCEL NON-BLD 5MMX100MML (ENDOMECHANICALS) ×3 IMPLANT
TUBING INSUFFLATION (TUBING) ×3 IMPLANT

## 2016-11-11 NOTE — Progress Notes (Signed)
Day of Surgery   Subjective/Chief Complaint: RLQ pain   Objective: Vital signs in last 24 hours: Temp:  [97.5 F (36.4 C)-98.5 F (36.9 C)] 97.5 F (36.4 C) (08/12 0542) Pulse Rate:  [59-89] 64 (08/12 0542) Resp:  [18] 18 (08/12 0542) BP: (102-125)/(55-90) 109/55 (08/12 0542) SpO2:  [96 %-100 %] 96 % (08/12 0542) Last BM Date: 11/10/16  Intake/Output from previous day: 08/11 0701 - 08/12 0700 In: 1017.5 [I.V.:1017.5] Out: -  Intake/Output this shift: No intake/output data recorded.  General appearance: cooperative Resp: clear to auscultation bilaterally Cardio: regular rate and rhythm GI: soft, tender RLQ, no gen tenderness  Lab Results:   Recent Labs  11/10/16 1805  WBC 15.1*  HGB 12.5  HCT 37.1  PLT 391   BMET  Recent Labs  11/10/16 1805  NA 137  K 3.5  CL 103  CO2 23  GLUCOSE 179*  BUN 5*  CREATININE 0.65  CALCIUM 9.1   PT/INR No results for input(s): LABPROT, INR in the last 72 hours. ABG No results for input(s): PHART, HCO3 in the last 72 hours.  Invalid input(s): PCO2, PO2  Studies/Results: Ct Abdomen Pelvis W Contrast  Result Date: 11/10/2016 CLINICAL DATA:  Acute onset of epigastric abdominal pain. Nausea and vomiting. Initial encounter. EXAM: CT ABDOMEN AND PELVIS WITH CONTRAST TECHNIQUE: Multidetector CT imaging of the abdomen and pelvis was performed using the standard protocol following bolus administration of intravenous contrast. CONTRAST:  ISOVUE-300 IOPAMIDOL (ISOVUE-300) INJECTION 61% COMPARISON:  None. FINDINGS: Lower chest: The visualized lung bases are grossly clear. The visualized portions of the mediastinum are unremarkable. Hepatobiliary: The liver is unremarkable in appearance. The gallbladder is unremarkable in appearance. The common bile duct remains normal in caliber. Pancreas: The pancreas is within normal limits. Spleen: The spleen is unremarkable in appearance. Adrenals/Urinary Tract: The adrenal glands are  unremarkable in appearance. The kidneys are within normal limits. There is no evidence of hydronephrosis. No renal or ureteral stones are identified. No perinephric stranding is seen. Stomach/Bowel: There is dilatation of the appendix to 1.2 cm in diameter, with mild wall thickening and surrounding soft tissue inflammation, compatible with acute appendicitis. There is no evidence of perforation or abscess formation. The colon is grossly unremarkable in appearance. A trace amount of free fluid is seen within the pelvis. The small bowel is grossly unremarkable. The stomach is unremarkable in appearance. Vascular/Lymphatic: The abdominal aorta is unremarkable in appearance. The inferior vena cava is grossly unremarkable. No retroperitoneal lymphadenopathy is seen. No pelvic sidewall lymphadenopathy is identified. Reproductive: The bladder is mildly distended and grossly unremarkable. The uterus is grossly unremarkable in appearance, with an intrauterine device noted in expected position. The ovaries are relatively symmetric. No suspicious adnexal masses are seen. Other: No additional soft tissue abnormalities are seen. Musculoskeletal: No acute osseous abnormalities are identified. The visualized musculature is unremarkable in appearance. IMPRESSION: Acute appendicitis, with dilatation of the appendix to 1.2 cm in diameter, mild wall thickening and surrounding soft tissue inflammation. No evidence of perforation or abscess formation. Trace amount of associated free fluid within the pelvis. These results were called by telephone at the time of interpretation on 11/10/2016 at 9:36 pm to Audry Pili PA, who verbally acknowledged these results. Electronically Signed   By: Roanna Raider M.D.   On: 11/10/2016 21:37    Anti-infectives: Anti-infectives    Start     Dose/Rate Route Frequency Ordered Stop   11/10/16 2200  cefTRIAXone (ROCEPHIN) 2 g in dextrose 5 % 50  mL IVPB     2 g 100 mL/hr over 30 Minutes Intravenous   Once 11/10/16 2148 11/10/16 2320   11/10/16 2200  metroNIDAZOLE (FLAGYL) IVPB 500 mg     500 mg 100 mL/hr over 60 Minutes Intravenous  Once 11/10/16 2148 11/10/16 2355      Assessment/Plan: Acute appendicitis - to OR for lap. Appendectomy. Procedure, risks,and benefots discussed. I discussed the very good chance this surgery will improve her symptoms. She agrees. Rocephin + Flagyl.  LOS: 0 days    Rosendo Couser E 11/11/2016

## 2016-11-11 NOTE — Progress Notes (Signed)
Patient returned to 6n10, alert and oriented, IV fluids infusing, mild pain to abdomen, 3 lap sites noted to abdomen with skin glue. Family by bedside will continue to monitor.

## 2016-11-11 NOTE — Anesthesia Preprocedure Evaluation (Signed)
Anesthesia Evaluation  Patient identified by MRN, date of birth, ID band Patient awake    Reviewed: Allergy & Precautions, NPO status , Patient's Chart, lab work & pertinent test results  Airway Mallampati: II  TM Distance: >3 FB Neck ROM: Full    Dental  (+) Teeth Intact, Dental Advisory Given   Pulmonary neg pulmonary ROS,    Pulmonary exam normal breath sounds clear to auscultation       Cardiovascular negative cardio ROS Normal cardiovascular exam Rhythm:Regular Rate:Normal     Neuro/Psych negative neurological ROS     GI/Hepatic Neg liver ROS, Appendicitis    Endo/Other  Obesity   Renal/GU negative Renal ROS     Musculoskeletal negative musculoskeletal ROS (+)   Abdominal   Peds  Hematology negative hematology ROS (+)   Anesthesia Other Findings Day of surgery medications reviewed with the patient.  Reproductive/Obstetrics                             Anesthesia Physical Anesthesia Plan  ASA: II  Anesthesia Plan: General   Post-op Pain Management:    Induction: Intravenous  PONV Risk Score and Plan: 3 and Ondansetron, Dexamethasone and Midazolam  Airway Management Planned: Oral ETT  Additional Equipment:   Intra-op Plan:   Post-operative Plan: Extubation in OR  Informed Consent: I have reviewed the patients History and Physical, chart, labs and discussed the procedure including the risks, benefits and alternatives for the proposed anesthesia with the patient or authorized representative who has indicated his/her understanding and acceptance.   Dental advisory given  Plan Discussed with: CRNA  Anesthesia Plan Comments: (Risks/benefits of general anesthesia discussed with patient including risk of damage to teeth, lips, gum, and tongue, nausea/vomiting, allergic reactions to medications, and the possibility of heart attack, stroke and death.  All patient questions  answered.  Patient wishes to proceed.)        Anesthesia Quick Evaluation

## 2016-11-11 NOTE — Anesthesia Postprocedure Evaluation (Signed)
Anesthesia Post Note  Patient: Sophie Haggart  ProcedLowell Guitarure(s) Performed: Procedure(s) (LRB): APPENDECTOMY LAPAROSCOPIC (N/A)     Patient location during evaluation: PACU Anesthesia Type: General Level of consciousness: awake and alert Pain management: pain level controlled Vital Signs Assessment: post-procedure vital signs reviewed and stable Respiratory status: spontaneous breathing, nonlabored ventilation and respiratory function stable Cardiovascular status: blood pressure returned to baseline and stable Postop Assessment: no signs of nausea or vomiting Anesthetic complications: no    Last Vitals:  Vitals:   11/11/16 1020 11/11/16 1046  BP:  110/64  Pulse:  78  Resp:    Temp: (!) 36.3 C 36.6 C  SpO2: 96% 97%    Last Pain:  Vitals:   11/11/16 1046  TempSrc: Oral  PainSc: 5                  Cecile HearingStephen Edward Jame Seelig

## 2016-11-11 NOTE — Transfer of Care (Signed)
Immediate Anesthesia Transfer of Care Note  Patient: Chelsea Mathis  Procedure(s) Performed: Procedure(s): APPENDECTOMY LAPAROSCOPIC (N/A)  Patient Location: PACU  Anesthesia Type:General  Level of Consciousness: awake, alert , oriented and patient cooperative  Airway & Oxygen Therapy: Patient Spontanous Breathing  Post-op Assessment: Report given to RN and Post -op Vital signs reviewed and stable  Post vital signs: Reviewed  Last Vitals: 133/84, 91, 20, 100% Vitals:   11/10/16 2348 11/11/16 0542  BP: (!) 107/59 (!) 109/55  Pulse: 65 64  Resp: 18 18  Temp: 36.8 C (!) 36.4 C  SpO2: 98% 96%    Last Pain:  Vitals:   11/11/16 0542  TempSrc: Oral  PainSc:          Complications: No apparent anesthesia complications

## 2016-11-11 NOTE — Op Note (Signed)
11/10/2016 - 11/11/2016  9:17 AM  PATIENT:  Chelsea Mathis  26 y.o. female  PRE-OPERATIVE DIAGNOSIS:  APPENDICITIS  POST-OPERATIVE DIAGNOSIS:  APPENDICITIS  PROCEDURE:  Procedure(s): APPENDECTOMY LAPAROSCOPIC  SURGEON:  Surgeon(s): Violeta Gelinashompson, Madisynn Plair, MD  ASSISTANTS: none   ANESTHESIA:   local and general  EBL:  No intake/output data recorded.  BLOOD ADMINISTERED:none  DRAINS: none   SPECIMEN:  Excision  DISPOSITION OF SPECIMEN:  PATHOLOGY  COUNTS:  YES  DICTATION: .Dragon Dictation Findings: Suppurative appendicitis not perforated  Procedure in detail: Chelsea Mathis presents for laparoscopic appendectomy. She was identified in preop holding area. Informed consent was obtained. She received intravenous antibiotic. She was brought to the operating room and general endotracheal anesthesia was administered by the anesthesia staff. Her abdomen was prepped and draped in sterile fashion. We did a time out procedure.The infraumbilical region was infiltrated with local. Infraumbilical incision was made. Subcutaneous tissues were dissected down revealing the anterior fascia. This was divided sharply along the midline. Peritoneal cavity was entered under direct vision without complication. A 0 Vicryl pursestring was placed around the fascial opening. Hassan trocar was inserted into the abdomen. The abdomen was insufflated with carbon dioxide in standard fashion. Under direct vision a 12 mm left lower quadrant and 5 mm right mid abdomen port were placed. Local was used at each port site. Laparoscopic exploration revealed an inflamed but not perforated appendix. The mesoappendix was divided with the harmonic scalpel achieving excellent hemostasis. The appendiceal base was divided with an Endo GIA with vascular load. There was excellent staple line. The area was irrigated. There was no bleeding. The appendix was placed in an Endo Catch bag and removed from the abdomen. It was sent to pathology. Staple  line was rechecked and it was nicely intact. Good hemostasis. Ports were removed under direct vision. The perineum was released. Informed local fascia was closed by tying the pursestring. The port sites were closed with 4-0 Vicryl subcuticular followed by Dermabond. All counts were correct. She tolerated procedure well without apparent complications taken recovery in stable condition. PATIENT DISPOSITION:  PACU - hemodynamically stable.   Delay start of Pharmacological VTE agent (>24hrs) due to surgical blood loss or risk of bleeding:  no  Violeta GelinasBurke Domnique Vantine, MD, MPH, FACS Pager: [phone]  8/12/20189:17 AM

## 2016-11-11 NOTE — Anesthesia Procedure Notes (Signed)
Procedure Name: Intubation Date/Time: 11/11/2016 8:30 AM Performed by: Marchelle Folks ANN Pre-anesthesia Checklist: Patient identified, Emergency Drugs available, Suction available, Patient being monitored and Timeout performed Patient Re-evaluated:Patient Re-evaluated prior to induction Oxygen Delivery Method: Circle system utilized Preoxygenation: Pre-oxygenation with 100% oxygen Induction Type: IV induction and Rapid sequence Ventilation: Mask ventilation without difficulty Laryngoscope Size: Mac and 3 Grade View: Grade I Tube type: Oral Tube size: 7.0 mm Number of attempts: 1 Airway Equipment and Method: Stylet and Oral airway Placement Confirmation: ETT inserted through vocal cords under direct vision,  positive ETCO2 and breath sounds checked- equal and bilateral Secured at: 22 cm Tube secured with: Tape Dental Injury: Teeth and Oropharynx as per pre-operative assessment

## 2016-11-11 NOTE — Progress Notes (Signed)
Patient transported to OR, verbally gave report to OR nurses who came to get patient since no one was in short stay, pre-op checklist complete. IV capped and flushed before transport, no family in room at the time.

## 2016-11-12 ENCOUNTER — Encounter (HOSPITAL_COMMUNITY): Payer: Self-pay | Admitting: General Surgery

## 2016-11-12 MED ORDER — OXYCODONE HCL 5 MG PO TABS: 5.0000 mg | ORAL_TABLET | ORAL | 0 refills | Status: DC | PRN

## 2016-11-12 NOTE — Discharge Summary (Signed)
[facility] Discharge Summary   Patient ID: Waylan RocherReginee Pronovost MRN: [id number] DOB/AGE: 28/12/1990 28 y.o.  Admit date: 11/10/2016 Discharge date: 11/12/2016  Admitting Diagnosis: Acute appendicitis  Discharge Diagnosis Acute appendicitis - S/P laparoscopic appendectomy  Consultants None  Imaging: Ct Abdomen Pelvis W Contrast  Result Date: 11/10/2016 CLINICAL DATA:  Acute onset of epigastric abdominal pain. Nausea and vomiting. Initial encounter. EXAM: CT ABDOMEN AND PELVIS WITH CONTRAST TECHNIQUE: Multidetector CT imaging of the abdomen and pelvis was performed using the standard protocol following bolus administration of intravenous contrast. CONTRAST:  100mL ISOVUE-300 IOPAMIDOL (ISOVUE-300) INJECTION 61% COMPARISON:  None. FINDINGS: Lower chest: The visualized lung bases are grossly clear. The visualized portions of the mediastinum are unremarkable. Hepatobiliary: The liver is unremarkable in appearance. The gallbladder is unremarkable in appearance. The common bile duct remains normal in caliber. Pancreas: The pancreas is within normal limits. Spleen: The spleen is unremarkable in appearance. Adrenals/Urinary Tract: The adrenal glands are unremarkable in appearance. The kidneys are within normal limits. There is no evidence of hydronephrosis. No renal or ureteral stones are identified. No perinephric stranding is seen. Stomach/Bowel: There is dilatation of the appendix to 1.2 cm in diameter, with mild wall thickening and surrounding soft tissue inflammation, compatible with acute appendicitis. There is no evidence of perforation or abscess formation. The colon is grossly unremarkable in appearance. A trace amount of free fluid is seen within the pelvis. The small bowel is grossly unremarkable. The stomach is unremarkable in appearance. Vascular/Lymphatic: The abdominal aorta is unremarkable in appearance. The inferior vena cava is grossly unremarkable. No retroperitoneal  lymphadenopathy is seen. No pelvic sidewall lymphadenopathy is identified. Reproductive: The bladder is mildly distended and grossly unremarkable. The uterus is grossly unremarkable in appearance, with an intrauterine device noted in expected position. The ovaries are relatively symmetric. No suspicious adnexal masses are seen. Other: No additional soft tissue abnormalities are seen. Musculoskeletal: No acute osseous abnormalities are identified. The visualized musculature is unremarkable in appearance. IMPRESSION: Acute appendicitis, with dilatation of the appendix to 1.2 cm in diameter, mild wall thickening and surrounding soft tissue inflammation. No evidence of perforation or abscess formation. Trace amount of associated free fluid within the pelvis. These results were called by telephone at the time of interpretation on 11/10/2016 at 9:36 pm to Audry Piliyler Mohr PA, who verbally acknowledged these results. Electronically Signed   By: Roanna RaiderJeffery  Chang M.D.   On: 11/10/2016 21:37    Procedures Dr. Janee Mornhompson (11/11/16) - Laparoscopic Appendectomy  Hospital Course:  Patient is a 26 y/o female who [facility] to [facility] with abdominal pain.  Workup showed acute appendicitis.  Patient was admitted and underwent procedure listed above.  Tolerated procedure well and was transferred to the floor.  Diet was advanced as tolerated.  On POD#1, the patient was voiding well, tolerating diet, ambulating well, pain well controlled, vital signs stable, incisions c/d/i and felt stable for discharge home.  Patient will follow up in our office in 2 weeks and knows to call with questions or concerns. She will call to confirm appointment date/time.    Physical Exam: General:  Alert, NAD, pleasant, comfortable Resp: normal effort, CTAB Cardio: RRR, pedal pulses 2+ bilaterally Abd:  Soft, ND, mild tenderness, incisions C/D/I, normoactive bowel sounds present  I have personally looked this patient up in the [place] Controlled Substance  Database and reviewed their medications.  Allergies as of 11/12/2016   No Known Allergies     Medication List    TAKE these medications  oxyCODONE 5 MG immediate release tablet Commonly known as:  Oxy IR/ROXICODONE Take 1 tablet (5 mg total) by mouth every 4 (four) hours as needed for moderate pain or severe pain.        Follow-up Information    [facility] Call.   Specialty:  General Surgery Why:  Call to confirm appointment date/time. Please plan to arrive 30 min prior to appointment time. Bring photo ID and any insurance information.  Contact information: [address] [place] [address] [phone]           Signed: Wells Guiles , [facility] 11/12/2016, 7:52 AM Pager: [phone] Consults: [phone] Mon-Fri 7:00 am-4:30 pm Sat-Sun 7:00 am-11:30 am

## 2016-11-12 NOTE — Discharge Instructions (Signed)
Please arrive at least 30 min before your appointment to complete your check in paperwork.  If you are unable to arrive 30 min prior to your appointment time we may have to cancel or reschedule you. ° °LAPAROSCOPIC SURGERY: POST OP INSTRUCTIONS  °1. DIET: Follow a light bland diet the first 24 hours after arrival home, such as soup, liquids, crackers, etc. Be sure to include lots of fluids daily. Avoid fast food or heavy meals as your are more likely to get nauseated. Eat a low fat the next few days after surgery.  °2. Take your usually prescribed home medications unless otherwise directed. °3. PAIN CONTROL:  °1. Pain is best controlled by a usual combination of three different methods TOGETHER:  °1. Ice/Heat °2. Over the counter pain medication °3. Prescription pain medication °2. Most patients will experience some swelling and bruising around the incisions. Ice packs or heating pads (30-60 minutes up to 6 times a day) will help. Use ice for the first few days to help decrease swelling and bruising, then switch to heat to help relax tight/sore spots and speed recovery. Some people prefer to use ice alone, heat alone, alternating between ice & heat. Experiment to what works for you. Swelling and bruising can take several weeks to resolve.  °3. It is helpful to take an over-the-counter pain medication regularly for the first few weeks. Choose one of the following that works best for you:  °1. Naproxen (Aleve, etc) Two 220mg tabs twice a day °2. Ibuprofen (Advil, etc) Three 200mg tabs four times a day (every meal & bedtime) °3. Acetaminophen (Tylenol, etc) 500-650mg four times a day (every meal & bedtime) °4. A prescription for pain medication (such as oxycodone, hydrocodone, etc) should be given to you upon discharge. Take your pain medication as prescribed.  °1. If you are having problems/concerns with the prescription medicine (does not control pain, nausea, vomiting, rash, itching, etc), please call us [phone] to see if we need to switch you to a different pain medicine that will work better for you and/or control your side effect better. °2. If you need a refill on your pain medication, please contact your pharmacy. They will contact our office to request authorization. Prescriptions will not be filled after 5 pm or on week-ends. °4. Avoid getting constipated. Between the surgery and the pain medications, it is common to experience some constipation. Increasing fluid intake and taking a fiber supplement (such as Metamucil, Citrucel, FiberCon, MiraLax, etc) 1-2 times a day regularly will usually help prevent this problem from occurring. A mild laxative (prune juice, Milk of Magnesia, MiraLax, etc) should be taken according to package directions if there are no bowel movements after 48 hours.  °5. Watch out for diarrhea. If you have many loose bowel movements, simplify your diet to bland foods & liquids for a few days. Stop any stool softeners and decrease your fiber supplement. Switching to mild anti-diarrheal medications (Kayopectate, Pepto Bismol) can help. If this worsens or does not improve, please call us. °6. Wash / shower every day. You may shower over the dressings as they are waterproof. Continue to shower over incision(s) after the dressing is off. °7. Remove your waterproof bandages 5 days after surgery. You may leave the incision open to air. You may replace a dressing/Band-Aid to cover the incision for comfort if you wish.  °8. ACTIVITIES as tolerated:  °1. You may resume regular (light) daily activities beginning the next day--such as daily self-care, walking, climbing stairs--gradually   increasing activities as tolerated. If you can walk 30 minutes without difficulty, it is safe to try more intense activity such as jogging, treadmill, bicycling, low-impact aerobics, swimming, etc. °2. Save the most intensive and strenuous activity for last such as sit-ups, heavy lifting, contact sports, etc Refrain  from any heavy lifting or straining until you are off narcotics for pain control.  °3. DO NOT PUSH THROUGH PAIN. Let pain be your guide: If it hurts to do something, don't do it. Pain is your body warning you to avoid that activity for another week until the pain goes down. °4. You may drive when you are no longer taking prescription pain medication, you can comfortably wear a seatbelt, and you can safely maneuver your car and apply brakes. °5. You may have sexual intercourse when it is comfortable.  °9. FOLLOW UP in our office  °1. Please call [facility] at [phone] to set up an appointment to see your surgeon in the office for a follow-up appointment approximately 2-3 weeks after your surgery. °2. Make sure that you call for this appointment the day you arrive home to insure a convenient appointment time. °     10. IF YOU HAVE DISABILITY OR FAMILY LEAVE FORMS, BRING THEM TO THE               OFFICE FOR PROCESSING.  ° °WHEN TO CALL US [phone]:  °1. Poor pain control °2. Reactions / problems with new medications (rash/itching, nausea, etc)  °3. Fever over 101.5 F (38.5 C) °4. Inability to urinate °5. Nausea and/or vomiting °6. Worsening swelling or bruising °7. Continued bleeding from incision. °8. Increased pain, redness, or drainage from the incision ° °The clinic staff is available to answer your questions during regular business hours (8:30am-5pm). Please don’t hesitate to call and ask to speak to one of our nurses for clinical concerns.  °If you have a medical emergency, go to the nearest emergency room or call 911.  °A surgeon from [facility] is always on call at the hospitals  ° °[facility]  °[address], [address], [place], [place] [address] ?  °MAIN: [phone] ? TOLL FREE: [phone] ?  °FAX [phone]  °[email or web address] ° °

## 2016-11-12 NOTE — Progress Notes (Signed)
Chelsea Mathis to be D/C'd  per MD order. Discussed with the patient and all questions fully answered.  VSS, Skin clean, dry and intact without evidence of skin break down, no evidence of skin tears noted.  IV catheter discontinued intact. Site without signs and symptoms of complications. Dressing and pressure applied.  An After Visit Summary was printed and given to the patient. Patient received prescription.  D/c education completed with patient/family including follow up instructions, medication list, d/c activities limitations if indicated, with other d/c instructions as indicated by MD - patient able to verbalize understanding, all questions fully answered.   Patient instructed to return to ED, call 911, or call MD for any changes in condition.   Patient to be escorted via WC, and D/C home via private auto.

## 2016-11-13 LAB — URINE CULTURE

## 2019-05-27 ENCOUNTER — Ambulatory Visit (INDEPENDENT_AMBULATORY_CARE_PROVIDER_SITE_OTHER): Payer: Self-pay | Admitting: Primary Care

## 2019-06-04 ENCOUNTER — Telehealth: Payer: Medicaid Other | Admitting: Emergency Medicine

## 2019-06-04 DIAGNOSIS — N898 Other specified noninflammatory disorders of vagina: Secondary | ICD-10-CM

## 2019-06-04 NOTE — Progress Notes (Signed)
Based on what you shared with me, I feel your condition warrants further evaluation and I recommend that you be seen for a face to face office visit.  Vaginal discharge that is Green/Yellow is very often an sexually transmitted infection.  It is [facility]'s policy that these infections not be treated via e-visit.  I recommend going to one of the urgent cares listed below or making an appointment with your doctor.   I'm sorry that we can't be of more help.  You will not be charged for this visit.   NOTE: If you entered your credit card information for this eVisit, you will not be charged. You may see a "hold" on your card for the $35 but that hold will drop off and you will not have a charge processed.   If you are having a true medical emergency please call 911.      For an urgent face to face visit, [facility] has five urgent care centers for your convenience:      NEW:  [facility] Center at [facility] Directions [phone] [address] [place], [place] [address] . 10 am - 6pm Monday - Friday    [facility]) Get Driving Directions [phone] [address] [place], [place] [address] . 10 am to 8 pm Monday-Friday . 12 pm to 8 pm [facility] Urgent Care at [facility] Get Driving Directions [phone] [address] [place], [place] [address] . 8 am to 8 pm Monday-Friday . 9 am to 6 pm Saturday . 11 am to 6 pm Sunday     [facility] Urgent Care at [facility] Get Driving Directions  [phone] [address] [place], [place] [address] . 8 am to 8 pm Monday-Friday . 8 am to 4 pm [facility] Urgent Care at [facility] Directions [phone] [address] [place], [place] [address] . 12 pm to 6 pm Monday-Friday      Your e-visit answers were reviewed by a board certified advanced clinical  practitioner to complete your personal care plan.  Thank you for using e-Visits.   Approximately 5 minutes was used in reviewing the patient's chart, questionnaire, prescribing medications, and documentation.

## 2019-06-05 ENCOUNTER — Other Ambulatory Visit: Payer: Self-pay | Admitting: Obstetrics and Gynecology

## 2019-06-05 ENCOUNTER — Telehealth: Payer: Self-pay | Admitting: Obstetrics and Gynecology

## 2019-06-05 DIAGNOSIS — N939 Abnormal uterine and vaginal bleeding, unspecified: Secondary | ICD-10-CM

## 2019-06-05 NOTE — Telephone Encounter (Signed)
Pt is scheduleD

## 2019-06-05 NOTE — Telephone Encounter (Signed)
Called and left voice mail for patient to call back to be schedule °

## 2019-06-05 NOTE — Telephone Encounter (Signed)
error 

## 2019-06-05 NOTE — Telephone Encounter (Signed)
Annual and ultrasound order has been placed

## 2019-06-24 ENCOUNTER — Ambulatory Visit: Payer: Medicaid Other | Admitting: Obstetrics and Gynecology

## 2019-06-24 ENCOUNTER — Other Ambulatory Visit: Payer: Medicaid Other

## 2020-09-23 ENCOUNTER — Emergency Department (HOSPITAL_COMMUNITY): Payer: Medicaid Other

## 2020-09-23 ENCOUNTER — Encounter (HOSPITAL_COMMUNITY): Payer: Self-pay | Admitting: Emergency Medicine

## 2020-09-23 ENCOUNTER — Emergency Department (HOSPITAL_COMMUNITY)
Admission: EM | Admit: 2020-09-23 | Discharge: 2020-09-23 | Disposition: A | Discharge status: Home / Self Care | Payer: Medicaid Other | Attending: Emergency Medicine | Admitting: Emergency Medicine

## 2020-09-23 ENCOUNTER — Other Ambulatory Visit: Payer: Self-pay

## 2020-09-23 DIAGNOSIS — Z8632 Personal history of gestational diabetes: Secondary | ICD-10-CM | POA: Insufficient documentation

## 2020-09-23 DIAGNOSIS — R109 Unspecified abdominal pain: Secondary | ICD-10-CM | POA: Diagnosis not present

## 2020-09-23 DIAGNOSIS — R112 Nausea with vomiting, unspecified: Secondary | ICD-10-CM | POA: Diagnosis not present

## 2020-09-23 DIAGNOSIS — Z7984 Long term (current) use of oral hypoglycemic drugs: Secondary | ICD-10-CM | POA: Diagnosis not present

## 2020-09-23 DIAGNOSIS — R739 Hyperglycemia, unspecified: Secondary | ICD-10-CM | POA: Diagnosis not present

## 2020-09-23 LAB — CBC
HCT: 42.1 % (ref 36.0–46.0)
Hemoglobin: 13.5 g/dL (ref 12.0–15.0)
MCH: 26.5 pg (ref 26.0–34.0)
MCHC: 32.1 g/dL (ref 30.0–36.0)
MCV: 82.5 fL (ref 80.0–100.0)
Platelets: 475 10*3/uL — ABNORMAL HIGH (ref 150–400)
RBC: 5.1 MIL/uL (ref 3.87–5.11)
RDW: 11.6 % (ref 11.5–15.5)
WBC: 12.3 10*3/uL — ABNORMAL HIGH (ref 4.0–10.5)
nRBC: 0 % (ref 0.0–0.2)

## 2020-09-23 LAB — URINALYSIS, ROUTINE W REFLEX MICROSCOPIC
Bilirubin Urine: NEGATIVE
Glucose, UA: >=500 mg/dL — AB
Hgb urine dipstick: NEGATIVE
Ketones, ur: NEGATIVE mg/dL
Leukocytes,Ua: NEGATIVE
Nitrite: NEGATIVE
Protein, ur: NEGATIVE mg/dL
Specific Gravity, Urine: 1.033 — ABNORMAL HIGH (ref 1.005–1.030)
pH: 5 (ref 5.0–8.0)

## 2020-09-23 LAB — COMPREHENSIVE METABOLIC PANEL
ALT: 13 U/L (ref 0–44)
AST: 15 U/L (ref 15–41)
Albumin: 3.5 g/dL (ref 3.5–5.0)
Alkaline Phosphatase: 128 U/L — ABNORMAL HIGH (ref 38–126)
Anion gap: 12 (ref 5–15)
BUN: 9 mg/dL (ref 6–20)
CO2: 25 mmol/L (ref 22–32)
Calcium: 9.3 mg/dL (ref 8.9–10.3)
Chloride: 95 mmol/L — ABNORMAL LOW (ref 98–111)
Creatinine, Ser: 0.57 mg/dL (ref 0.44–1.00)
GFR, Estimated: >60 mL/min (ref >60)
Glucose, Bld: 360 mg/dL — ABNORMAL HIGH (ref 70–99)
Potassium: 4 mmol/L (ref 3.5–5.1)
Sodium: 132 mmol/L — ABNORMAL LOW (ref 135–145)
Total Bilirubin: 0.7 mg/dL (ref 0.3–1.2)
Total Protein: 7.8 g/dL (ref 6.5–8.1)

## 2020-09-23 LAB — I-STAT BETA HCG BLOOD, ED (MC, WL, AP ONLY): I-stat hCG, quantitative: <5 m[IU]/mL (ref <5)

## 2020-09-23 LAB — LIPASE, BLOOD: Lipase: 25 U/L (ref 11–51)

## 2020-09-23 IMAGING — CT CT RENAL STONE PROTOCOL
2 of 4 series · 17 of 46 positions shown, 19 images · non-contrast
Comparison: CT abdomen pelvis [DATE]

CLINICAL DATA: Flank pain.  Kidney stone suspected.

EXAM:
CT ABDOMEN AND PELVIS WITHOUT CONTRAST
TECHNIQUE: Multidetector CT imaging of the abdomen and pelvis was performed
following the standard protocol without IV contrast.

[Series 3: ap without · axial · non-contrast · 0.83mm/px · z∈[+919,+1329]mm · 14 of 94 slices shown, 16 images]
[im 6/94  soft-tissue]
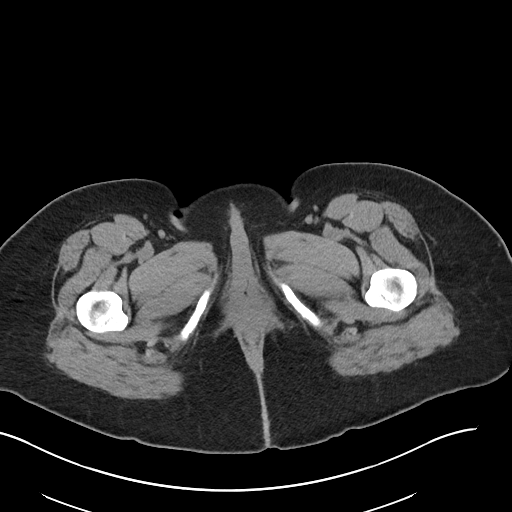
[im 6/94  bone]
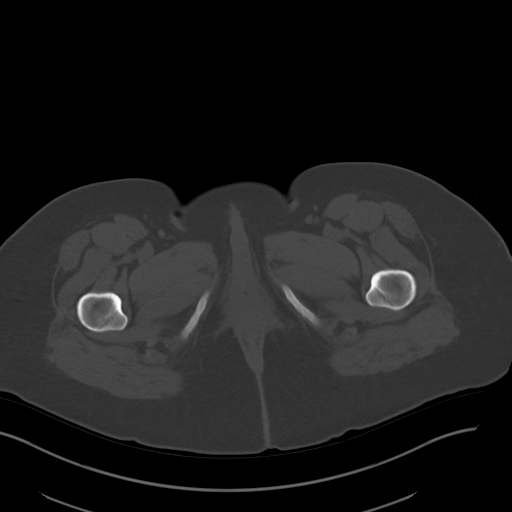
[im 11/94  soft-tissue]
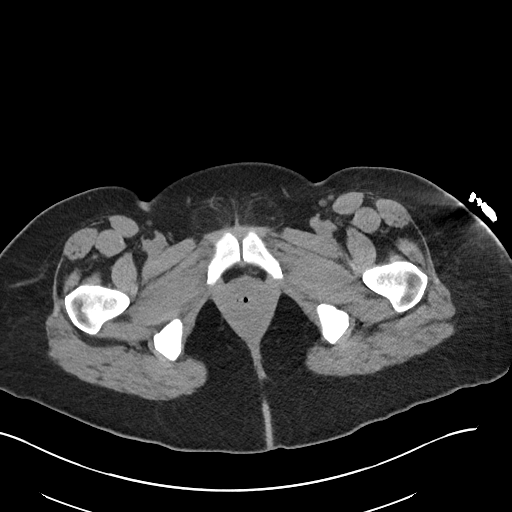
[im 21/94  soft-tissue]
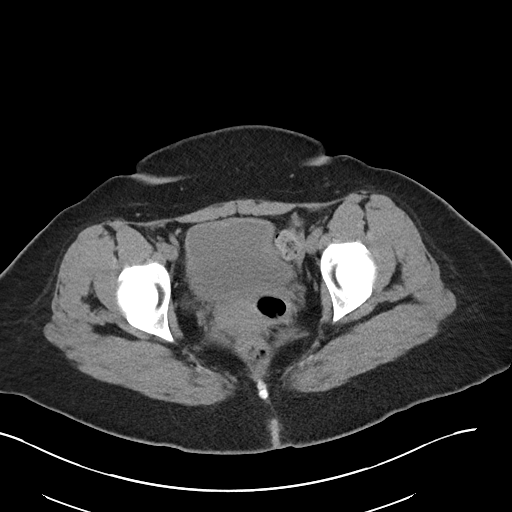
[im 26/94  soft-tissue]
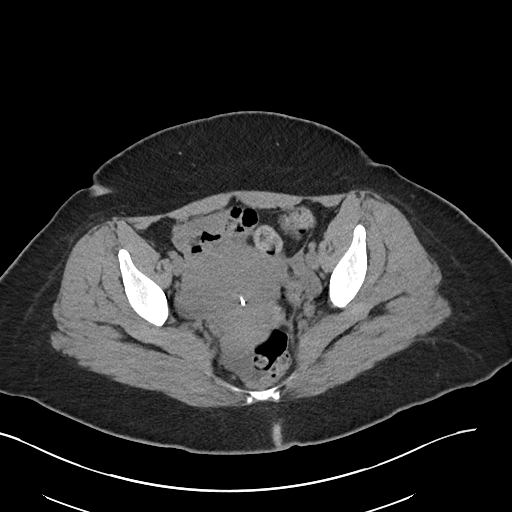
[im 32/94  soft-tissue]
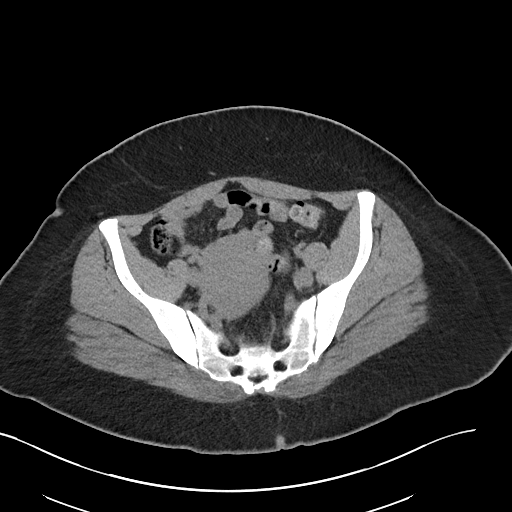
[im 37/94  soft-tissue]
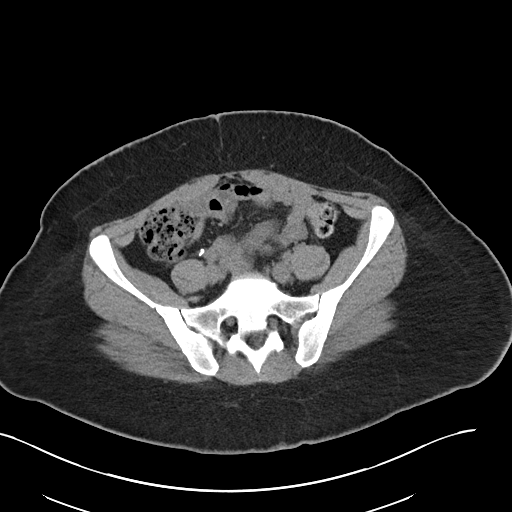
[im 42/94  soft-tissue]
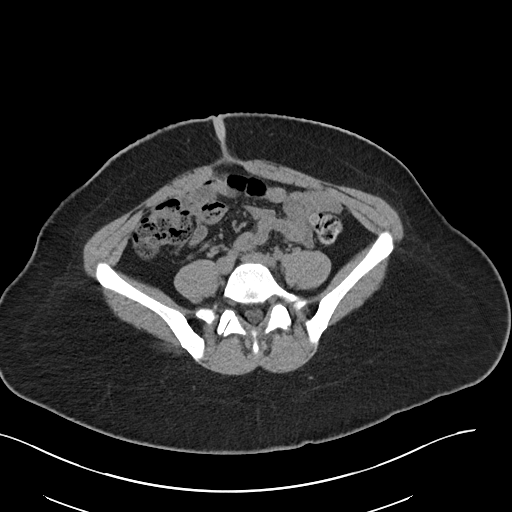
[im 52/94  soft-tissue]
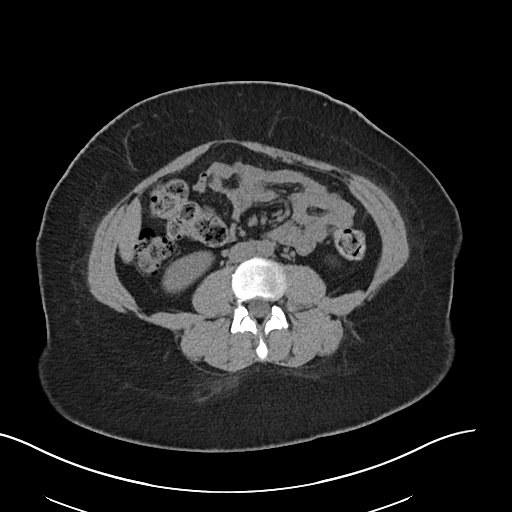
[im 57/94  soft-tissue]
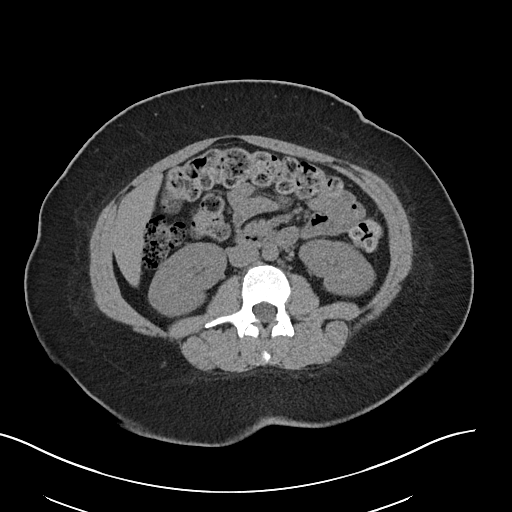
[im 57/94  bone]
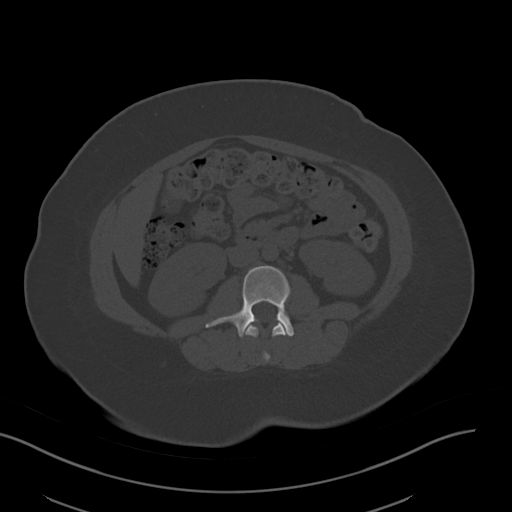
[im 63/94  soft-tissue]
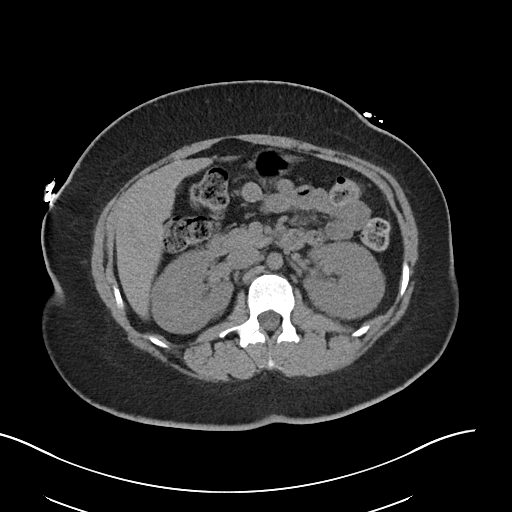
[im 68/94  soft-tissue]
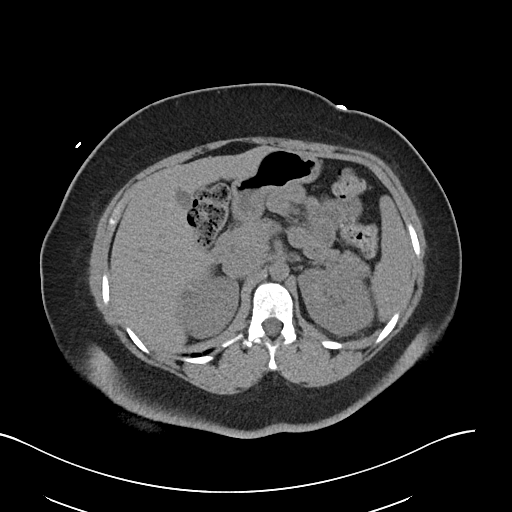
[im 73/94  soft-tissue]
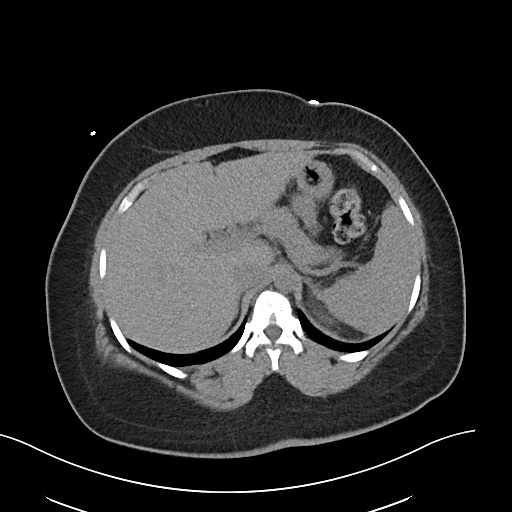
[im 83/94  soft-tissue]
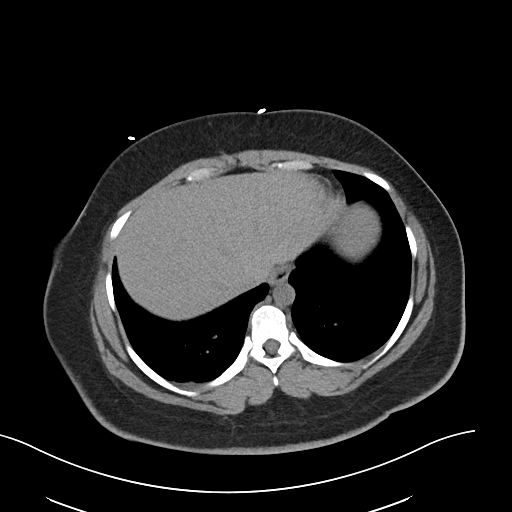
[im 88/94  soft-tissue]
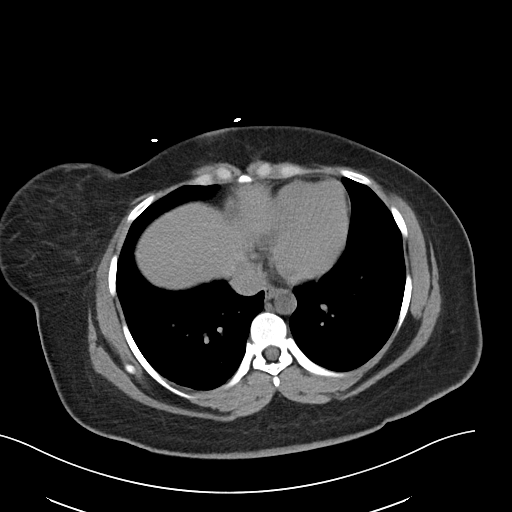

[Series 6: cor · coronal · 0.93mm/px · 3 of 105 slices shown]
[im 35/105  soft-tissue]
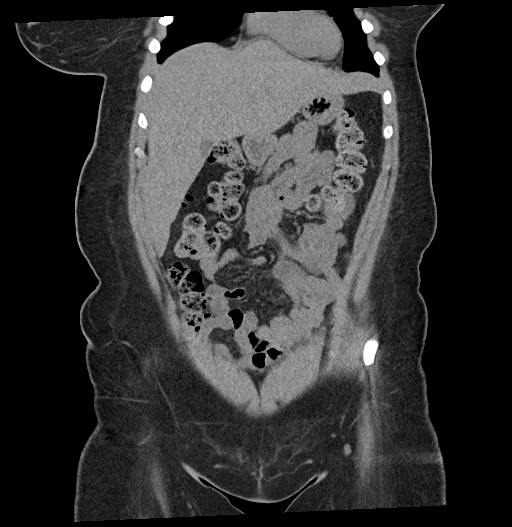
[im 47/105  soft-tissue]
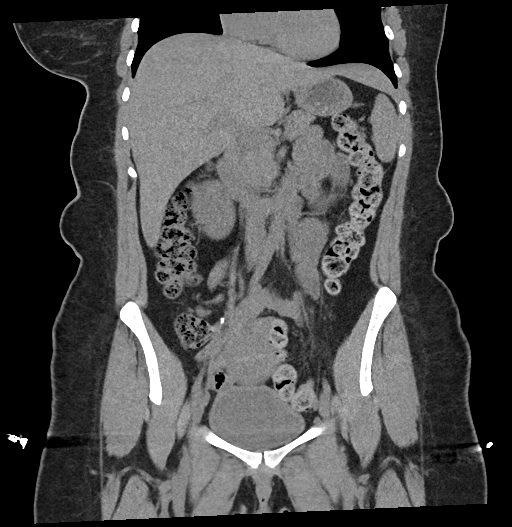
[im 58/105  soft-tissue]
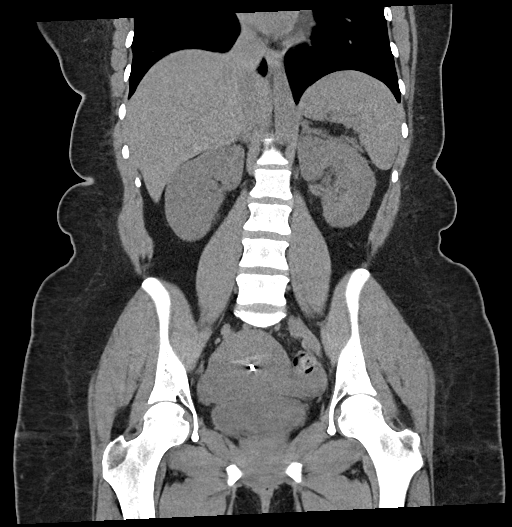

[17 of 46 positions shown; findings below may reference images not displayed]

FINDINGS: Lower chest: Subsegmental right atelectasis.

Hepatobiliary: No focal liver abnormality. The gallbladder is
contracted. No gallstones, gallbladder wall thickening, or
pericholecystic fluid. No biliary dilatation.

Pancreas: No focal lesion. Normal pancreatic contour. No surrounding
inflammatory changes. No main pancreatic ductal dilatation.

Spleen: Normal in size without focal abnormality.

Adrenals/Urinary Tract:

No adrenal nodule bilaterally.

No nephrolithiasis, no hydronephrosis, and no contour-deforming
renal mass. No ureterolithiasis or hydroureter.

The urinary bladder is unremarkable.

Stomach/Bowel: Stomach is within normal limits. No evidence of bowel
wall thickening or dilatation. Stool throughout the colon. Status
post appendectomy.

Vascular/Lymphatic: No significant vascular findings are present. No
enlarged abdominal or pelvic lymph nodes.

Reproductive: T shaped intrauterine device noted within the uterus
in grossly appropriate position. Tampon noted within the vagina.
Uterus and bilateral adnexa are unremarkable.

Other: No intraperitoneal free fluid. No intraperitoneal free gas.
No organized fluid collection.

Musculoskeletal:

No abdominal wall hernia or abnormality.

No suspicious lytic or blastic osseous lesions. No acute displaced
fracture.
IMPRESSION: 1. Constipation.
2. T-shaped intrauterine device in grossly appropriate position.

## 2020-09-23 MED ORDER — METFORMIN HCL 500 MG PO TABS: 500.0000 mg | ORAL_TABLET | Freq: Two times a day (BID) | ORAL | 0 refills | Status: AC

## 2020-09-23 MED ORDER — SODIUM CHLORIDE 0.9 % IV BOLUS
1000.0000 mL | Freq: Once | INTRAVENOUS | Status: AC
  Administered 2020-09-23: 1000 mL via INTRAVENOUS

## 2020-09-23 MED ORDER — KETOROLAC TROMETHAMINE 15 MG/ML IJ SOLN
15.0000 mg | Freq: Once | INTRAMUSCULAR | Status: AC
  Administered 2020-09-23: 15 mg via INTRAVENOUS
  Filled 2020-09-23: qty 1

## 2020-09-23 MED ORDER — FENTANYL CITRATE (PF) 100 MCG/2ML IJ SOLN
50.0000 ug | Freq: Once | INTRAMUSCULAR | Status: AC
  Administered 2020-09-23: 50 ug via INTRAVENOUS
  Filled 2020-09-23: qty 2

## 2020-09-23 NOTE — Discharge Instructions (Addendum)
You may use over-the-counter Motrin (Ibuprofen), Acetaminophen (Tylenol), topical muscle creams such as SalonPas, Icy Hot, Bengay, etc. Please stretch, apply ice or heat (whichever helps), and have massage therapy for additional assistance.  

## 2020-09-23 NOTE — ED Notes (Signed)
Patient transported to CT 

## 2020-09-23 NOTE — ED Triage Notes (Addendum)
Patient states she has had a three day history of left flank pain.  She states that she can pinpoint the area of pain.  It does not radiate anywhere.  Patient states that the first two days, APAP was helping with the pain, but she is not getting any more relief.  She did vomit before coming in tonight.  She continues with nausea.

## 2020-09-23 NOTE — ED Provider Notes (Signed)
[facility] EMERGENCY DEPARTMENT Provider Note  CSN: [id number] Arrival date & time: 09/23/20 0356  Chief Complaint(s) Abdominal Pain and Flank Pain  HPI Chelsea Mathis is a 30 y.o. female    Abdominal Pain Pain location:  L flank Pain quality: not aching   Pain radiates to:  Does not radiate Pain severity:  Moderate Onset quality:  Gradual Duration:  3 days Timing:  Constant Progression:  Waxing and waning Chronicity:  New Relieved by:  Acetaminophen (but has stopped working) Worsened by:  Movement and palpation Associated symptoms: nausea and vomiting (once tonight)   Associated symptoms: no diarrhea, no dysuria, no fever and no hematuria   Flank Pain Associated symptoms include abdominal pain.   Past Medical History Past Medical History:  Diagnosis Date   Gestational diabetes    Patient Active Problem List   Diagnosis Date Noted   Acute appendicitis 11/10/2016   Postpartum care following vaginal delivery 05/13/2015   Gestational diabetes mellitus (GDM) affecting pregnancy 05/13/2015   Home Medication(s) Prior to Admission medications   Medication Sig Start Date End Date Taking? Authorizing Provider  acetaminophen (TYLENOL) 500 MG tablet Take 1,000 mg by mouth every 6 (six) hours as needed for moderate pain or headache.   Yes [provider]  metFORMIN (GLUCOPHAGE) 500 MG tablet Take 1 tablet (500 mg total) by mouth 2 (two) times daily with a meal. 09/23/20 10/23/20 Yes Lulia Schriner, Amadeo Garnet, MD  oxyCODONE (OXY IR/ROXICODONE) 5 MG immediate release tablet Take 1 tablet (5 mg total) by mouth every 4 (four) hours as needed for moderate pain or severe pain. Patient not taking: Reported on 09/23/2020 11/12/16   Dorena Dew                                                                                                                                    Past Surgical History Past Surgical History:  Procedure Laterality Date    LAPAROSCOPIC APPENDECTOMY N/A 11/11/2016   Procedure: APPENDECTOMY LAPAROSCOPIC;  Surgeon: Violeta Gelinas, MD;  Location: [facility] OR;  Service: General;  Laterality: N/A;   Family History Family History  Problem Relation Age of Onset   Diabetes Mother    Hypertension Mother    Diabetes Maternal Uncle    Diabetes Brother    Stroke Maternal Grandmother    Leukemia Paternal Grandmother    Leukemia Paternal Grandfather     Social History Social History   Tobacco Use   Smoking status: Never   Smokeless tobacco: Never  Substance Use Topics   Alcohol use: No    Alcohol/week: 0.0 standard drinks   Drug use: No   Allergies Patient has no known allergies.  Review of Systems Review of Systems  Constitutional:  Negative for fever.  Gastrointestinal:  Positive for abdominal pain, nausea and vomiting (once tonight). Negative for diarrhea.  Genitourinary:  Positive for flank pain. Negative for difficulty urinating, dysuria, frequency, hematuria,  pelvic pain and urgency.  All other systems are reviewed and are negative for acute change except as noted in the HPI  Physical Exam Vital Signs  I have reviewed the triage vital signs BP 106/65   Pulse 80   Temp 99 F (37.2 C) (Oral)   Resp 20   Ht  (1.626 m)   Wt 90.3 kg   SpO2 96%   BMI 34.16 kg/m   Physical Exam Vitals reviewed.  Constitutional:      General: She is not in acute distress.    Appearance: She is well-developed. She is not diaphoretic.  HENT:     Head: Normocephalic and atraumatic.     Right Ear: External ear normal.     Left Ear: External ear normal.     Nose: Nose normal.  Eyes:     General: No scleral icterus.    Conjunctiva/sclera: Conjunctivae normal.  Neck:     Trachea: Phonation normal.  Cardiovascular:     Rate and Rhythm: Normal rate and regular rhythm.  Pulmonary:     Effort: Pulmonary effort is normal. No respiratory distress.     Breath sounds: No stridor.  Abdominal:     General: There is  no distension.     Tenderness: There is no abdominal tenderness.  Musculoskeletal:        General: Normal range of motion.     Cervical back: Normal range of motion.     Thoracic back: Tenderness present.       Back:  Neurological:     Mental Status: She is alert and oriented to person, place, and time.  Psychiatric:        Behavior: Behavior normal.    ED Results and Treatments Labs (all labs ordered are listed, but only abnormal results are displayed) Labs Reviewed  COMPREHENSIVE METABOLIC PANEL - Abnormal; Notable for the following components:      Result Value   Sodium 132 (*)    Chloride 95 (*)    Glucose, Bld 360 (*)    Alkaline Phosphatase 128 (*)    All other components within normal limits  CBC - Abnormal; Notable for the following components:   WBC 12.3 (*)    Platelets 475 (*)    All other components within normal limits  URINALYSIS, ROUTINE W REFLEX MICROSCOPIC - Abnormal; Notable for the following components:   Specific Gravity, Urine 1.033 (*)    Glucose, UA >=500 (*)    Bacteria, UA MANY (*)    All other components within normal limits  LIPASE, BLOOD  I-STAT BETA HCG BLOOD, ED ([facility], [facility], [facility] ONLY)                                                                                                                         EKG  EKG Interpretation  Date/Time:    Ventricular Rate:    PR Interval:    QRS Duration:   QT Interval:    QTC Calculation:  R Axis:     Text Interpretation:          Radiology CT Renal Stone Study  Result Date: 09/23/2020 CLINICAL DATA:  Flank pain.  Kidney stone suspected. EXAM: CT ABDOMEN AND PELVIS WITHOUT CONTRAST TECHNIQUE: Multidetector CT imaging of the abdomen and pelvis was performed following the standard protocol without IV contrast. COMPARISON:  CT abdomen pelvis 11/10/2016 FINDINGS: Lower chest: Subsegmental right atelectasis. Hepatobiliary: No focal liver abnormality. The gallbladder is contracted. No gallstones,  gallbladder wall thickening, or pericholecystic fluid. No biliary dilatation. Pancreas: No focal lesion. Normal pancreatic contour. No surrounding inflammatory changes. No main pancreatic ductal dilatation. Spleen: Normal in size without focal abnormality. Adrenals/Urinary Tract: No adrenal nodule bilaterally. No nephrolithiasis, no hydronephrosis, and no contour-deforming renal mass. No ureterolithiasis or hydroureter. The urinary bladder is unremarkable. Stomach/Bowel: Stomach is within normal limits. No evidence of bowel wall thickening or dilatation. Stool throughout the colon. Status post appendectomy. Vascular/Lymphatic: No significant vascular findings are present. No enlarged abdominal or pelvic lymph nodes. Reproductive: T shaped intrauterine device noted within the uterus in grossly appropriate position. Tampon noted within the vagina. Uterus and bilateral adnexa are unremarkable. Other: No intraperitoneal free fluid. No intraperitoneal free gas. No organized fluid collection. Musculoskeletal: No abdominal wall hernia or abnormality. No suspicious lytic or blastic osseous lesions. No acute displaced fracture. IMPRESSION: 1. Constipation. 2. T-shaped intrauterine device in grossly appropriate position. Electronically Signed   By: Tish Frederickson M.D.   On: 09/23/2020 06:30    Pertinent labs & imaging results that were available during my care of the patient were reviewed by me and considered in my medical decision making (see chart for details).  Medications Ordered in ED Medications  ketorolac (TORADOL) 15 MG/ML injection 15 mg (has no administration in time range)  fentaNYL (SUBLIMAZE) injection 50 mcg (50 mcg Intravenous Given 09/23/20 0555)  sodium chloride 0.9 % bolus 1,000 mL (1,000 mLs Intravenous New Bag/Given 09/23/20 0559)                                                                                                                                    Procedures Procedures  (including  critical care time)  Medical Decision Making / ED Course I have reviewed the nursing notes for this encounter and the patient's prior records (if available in EHR or on provided paperwork).   Chelsea Mathis was evaluated in Emergency Department on 09/23/2020 for the symptoms described in the history of present illness. She was evaluated in the context of the global COVID-19 pandemic, which necessitated consideration that the patient might be at risk for infection with the SARS-CoV-2 virus that causes COVID-19. Institutional protocols and algorithms that pertain to the evaluation of patients at risk for COVID-19 are in a state of rapid change based on information released by regulatory bodies including the CDC and federal and state organizations. These policies and algorithms were followed during the patient's care  in the ED.  Left flank pain. Hcg negative, thus not pregnancy related. Ua with bacteria, but no Leuks, Nits, or WBCs and patient is not having urinary symptoms. Doubt this is source. No hematuria and CT stone negative. No LLL PNA. No intraabdominal inflammatory/infectious process.  Likely MSK.  Labs notable for hyperglycemia. Will Rx/ Metformin. PCP f/u recommended.       Final Clinical Impression(s) / ED Diagnoses Final diagnoses:  Flank pain  Hyperglycemia   The patient appears reasonably screened and/or stabilized for discharge and I doubt any other medical condition or other [facility] requiring further screening, evaluation, or treatment in the ED at this time prior to discharge. Safe for discharge with strict return precautions.  Disposition: Discharge  Condition: Good  I have discussed the results, Dx and Tx plan with the patient/family who expressed understanding and agree(s) with the plan. Discharge instructions discussed at length. The patient/family was given strict return precautions who verbalized understanding of the instructions. No further questions at time of  discharge.    ED Discharge Orders          Ordered    metFORMIN (GLUCOPHAGE) 500 MG tablet  2 times daily with meals        09/23/20 0736    Hemoglobin A1C        09/23/20 0736            Follow Up: Vena Austria, MD [address] [place] [address] [phone]  Call  to schedule an appointment for close follow up      This chart was dictated using voice recognition software.  Despite best efforts to proofread,  errors can occur which can change the documentation meaning.    Nira Conn, MD 09/23/20 [phone]

## 2020-10-22 ENCOUNTER — Encounter (HOSPITAL_BASED_OUTPATIENT_CLINIC_OR_DEPARTMENT_OTHER): Payer: Self-pay | Admitting: Urology

## 2020-10-22 ENCOUNTER — Other Ambulatory Visit: Payer: Self-pay

## 2020-10-22 DIAGNOSIS — Z0189 Encounter for other specified special examinations: Secondary | ICD-10-CM | POA: Diagnosis not present

## 2020-10-22 DIAGNOSIS — M79604 Pain in right leg: Secondary | ICD-10-CM | POA: Insufficient documentation

## 2020-10-22 NOTE — ED Triage Notes (Signed)
Right upper thigh/groin pain x 10 days, states no wounds, no injury.

## 2020-10-23 ENCOUNTER — Other Ambulatory Visit (HOSPITAL_BASED_OUTPATIENT_CLINIC_OR_DEPARTMENT_OTHER): Payer: Self-pay | Admitting: Emergency Medicine

## 2020-10-23 ENCOUNTER — Ambulatory Visit (HOSPITAL_COMMUNITY): Admission: RE | Admit: 2020-10-23 | Payer: Medicaid Other | Source: Ambulatory Visit

## 2020-10-23 ENCOUNTER — Ambulatory Visit (HOSPITAL_BASED_OUTPATIENT_CLINIC_OR_DEPARTMENT_OTHER)
Admission: RE | Admit: 2020-10-23 | Discharge: 2020-10-23 | Disposition: A | Discharge status: Home / Self Care | Payer: Medicaid Other | Source: Ambulatory Visit | Attending: Emergency Medicine | Admitting: Emergency Medicine

## 2020-10-23 ENCOUNTER — Emergency Department (HOSPITAL_BASED_OUTPATIENT_CLINIC_OR_DEPARTMENT_OTHER)
Admission: EM | Admit: 2020-10-23 | Discharge: 2020-10-23 | Disposition: A | Discharge status: Home / Self Care | Payer: Medicaid Other | Attending: Emergency Medicine | Admitting: Emergency Medicine

## 2020-10-23 ENCOUNTER — Ambulatory Visit (HOSPITAL_COMMUNITY)
Admission: RE | Admit: 2020-10-23 | Discharge: 2020-10-23 | Disposition: A | Discharge status: Home / Self Care | Payer: Medicaid Other | Source: Ambulatory Visit | Attending: Emergency Medicine | Admitting: Emergency Medicine

## 2020-10-23 ENCOUNTER — Emergency Department (HOSPITAL_BASED_OUTPATIENT_CLINIC_OR_DEPARTMENT_OTHER): Payer: Medicaid Other

## 2020-10-23 DIAGNOSIS — R52 Pain, unspecified: Secondary | ICD-10-CM

## 2020-10-23 DIAGNOSIS — M79604 Pain in right leg: Secondary | ICD-10-CM

## 2020-10-23 LAB — CBC WITH DIFFERENTIAL/PLATELET
Abs Immature Granulocytes: 0.05 10*3/uL (ref 0.00–0.07)
Basophils Absolute: 0 10*3/uL (ref 0.0–0.1)
Basophils Relative: 0 %
Eosinophils Absolute: 0 10*3/uL (ref 0.0–0.5)
Eosinophils Relative: 0 %
HCT: 34.4 % — ABNORMAL LOW (ref 36.0–46.0)
Hemoglobin: 11.2 g/dL — ABNORMAL LOW (ref 12.0–15.0)
Immature Granulocytes: 0 %
Lymphocytes Relative: 11 %
Lymphs Abs: 1.8 10*3/uL (ref 0.7–4.0)
MCH: 26.2 pg (ref 26.0–34.0)
MCHC: 32.6 g/dL (ref 30.0–36.0)
MCV: 80.6 fL (ref 80.0–100.0)
Monocytes Absolute: 1 10*3/uL (ref 0.1–1.0)
Monocytes Relative: 6 %
Neutro Abs: 13.2 10*3/uL — ABNORMAL HIGH (ref 1.7–7.7)
Neutrophils Relative %: 83 %
Platelets: 442 10*3/uL — ABNORMAL HIGH (ref 150–400)
RBC: 4.27 MIL/uL (ref 3.87–5.11)
RDW: 12.3 % (ref 11.5–15.5)
WBC: 16.2 10*3/uL — ABNORMAL HIGH (ref 4.0–10.5)
nRBC: 0 % (ref 0.0–0.2)

## 2020-10-23 LAB — BASIC METABOLIC PANEL
Anion gap: 13 (ref 5–15)
BUN: 5 mg/dL — ABNORMAL LOW (ref 6–20)
CO2: 21 mmol/L — ABNORMAL LOW (ref 22–32)
Calcium: 8.7 mg/dL — ABNORMAL LOW (ref 8.9–10.3)
Chloride: 102 mmol/L (ref 98–111)
Creatinine, Ser: 0.42 mg/dL — ABNORMAL LOW (ref 0.44–1.00)
GFR, Estimated: >60 mL/min (ref >60)
Glucose, Bld: 278 mg/dL — ABNORMAL HIGH (ref 70–99)
Potassium: 3.8 mmol/L (ref 3.5–5.1)
Sodium: 136 mmol/L (ref 135–145)

## 2020-10-23 LAB — CK: Total CK: 52 U/L (ref 38–234)

## 2020-10-23 LAB — LACTIC ACID, PLASMA: Lactic Acid, Venous: 1.4 mmol/L (ref 0.5–1.9)

## 2020-10-23 LAB — HCG, QUANTITATIVE, PREGNANCY: hCG, Beta Chain, Quant, S: <1 m[IU]/mL (ref <5)

## 2020-10-23 IMAGING — DX DG FEMUR 2+V*R*
1 series · 4 of 4 positions shown · non-contrast
Comparison: None.

CLINICAL DATA: Atraumatic right upper thigh/groin pain x 10 days.

EXAM:
RIGHT FEMUR 2 VIEWS

[Series 1: femur · 0.14mm/px · 4 of 4 slices shown]
[im 1/4]
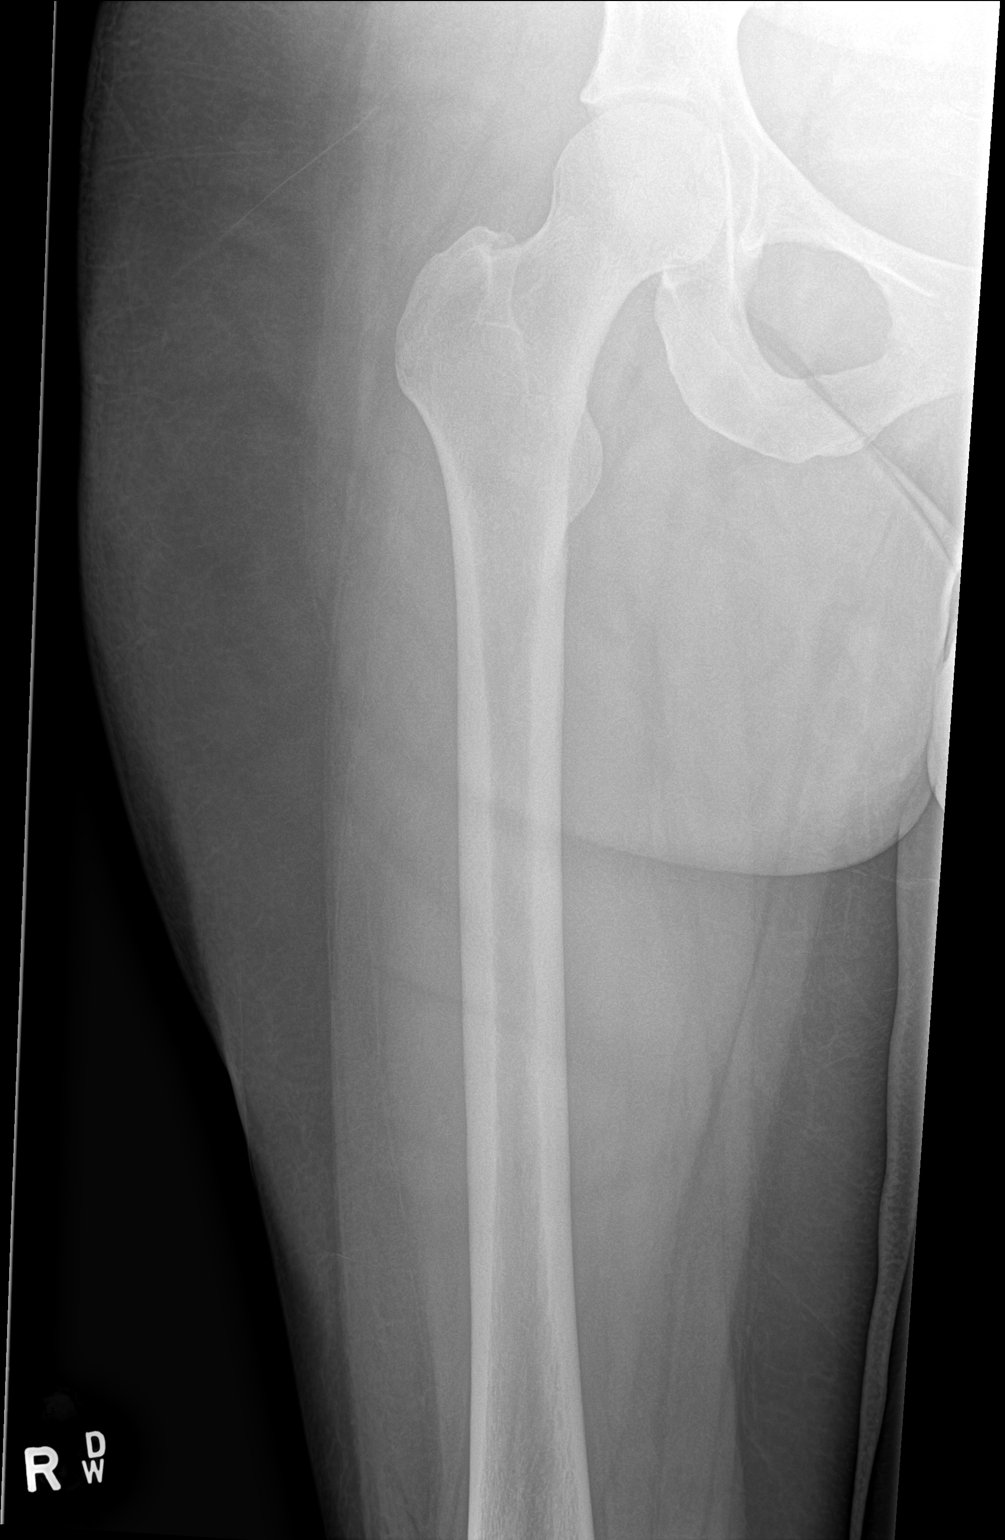
[im 2/4]
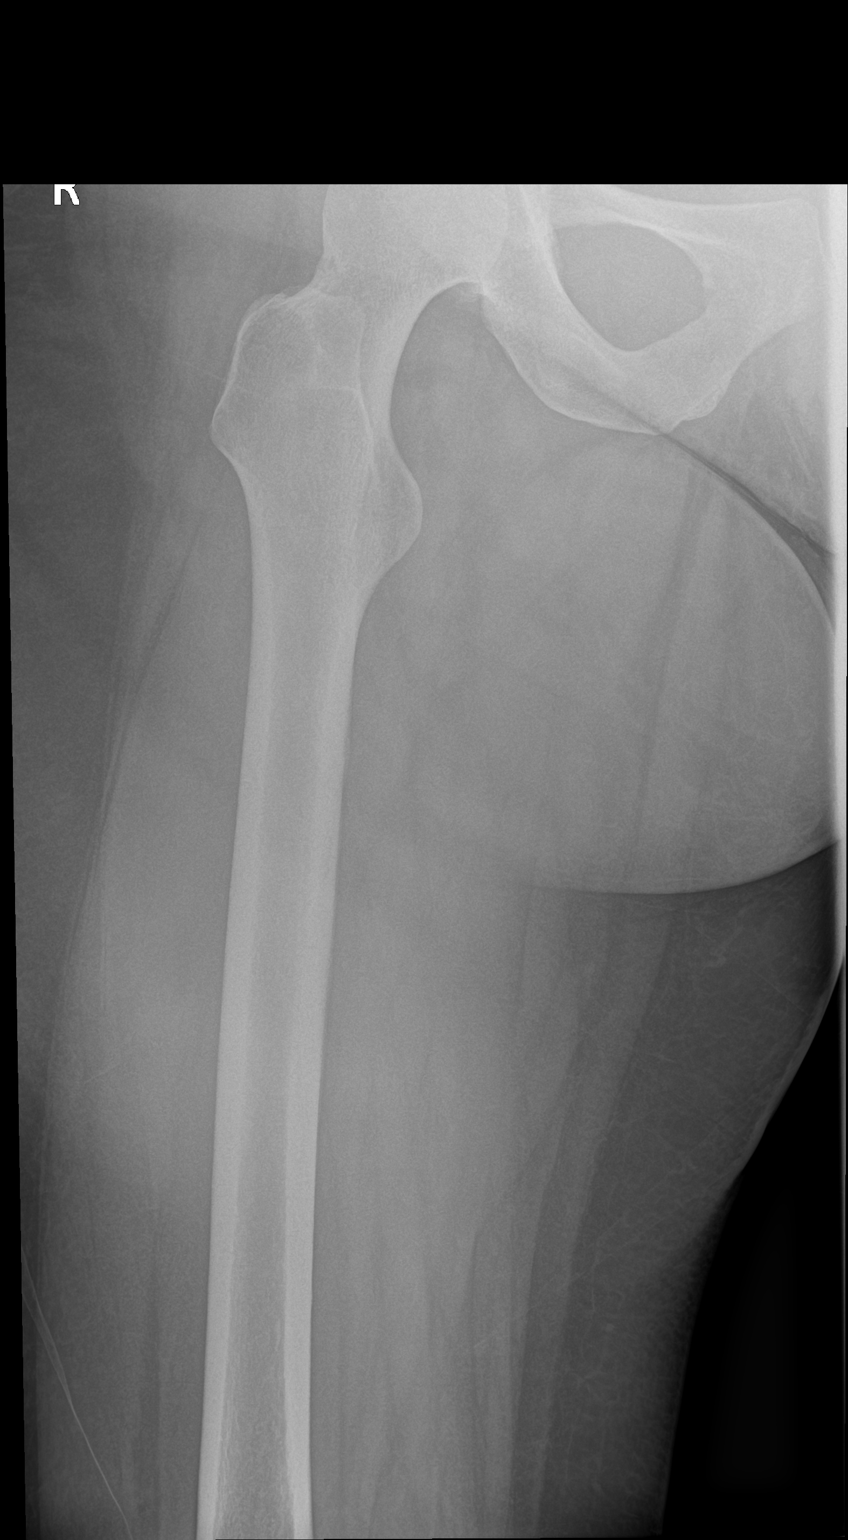
[im 3/4]
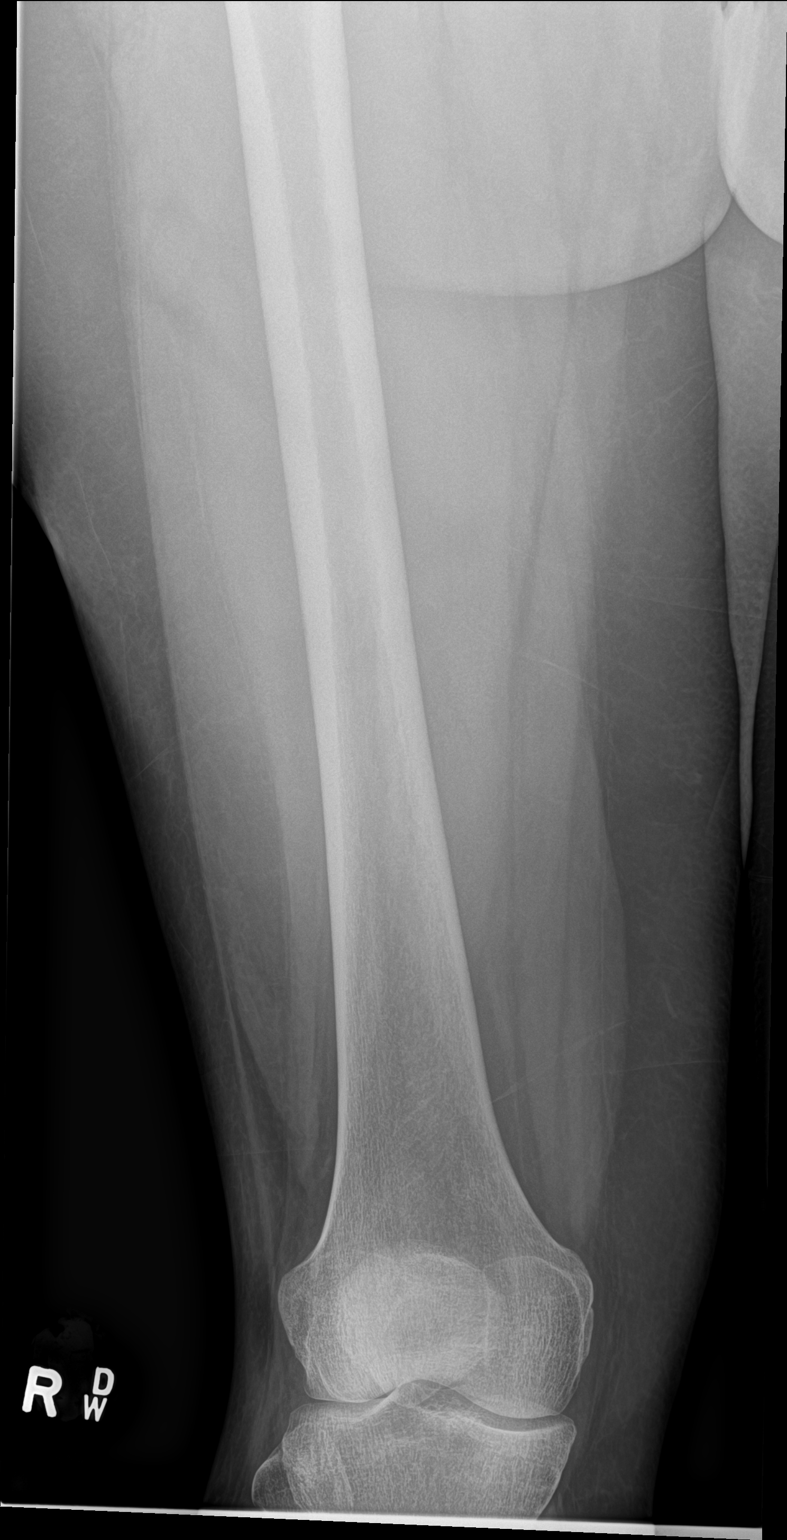
[im 4/4]
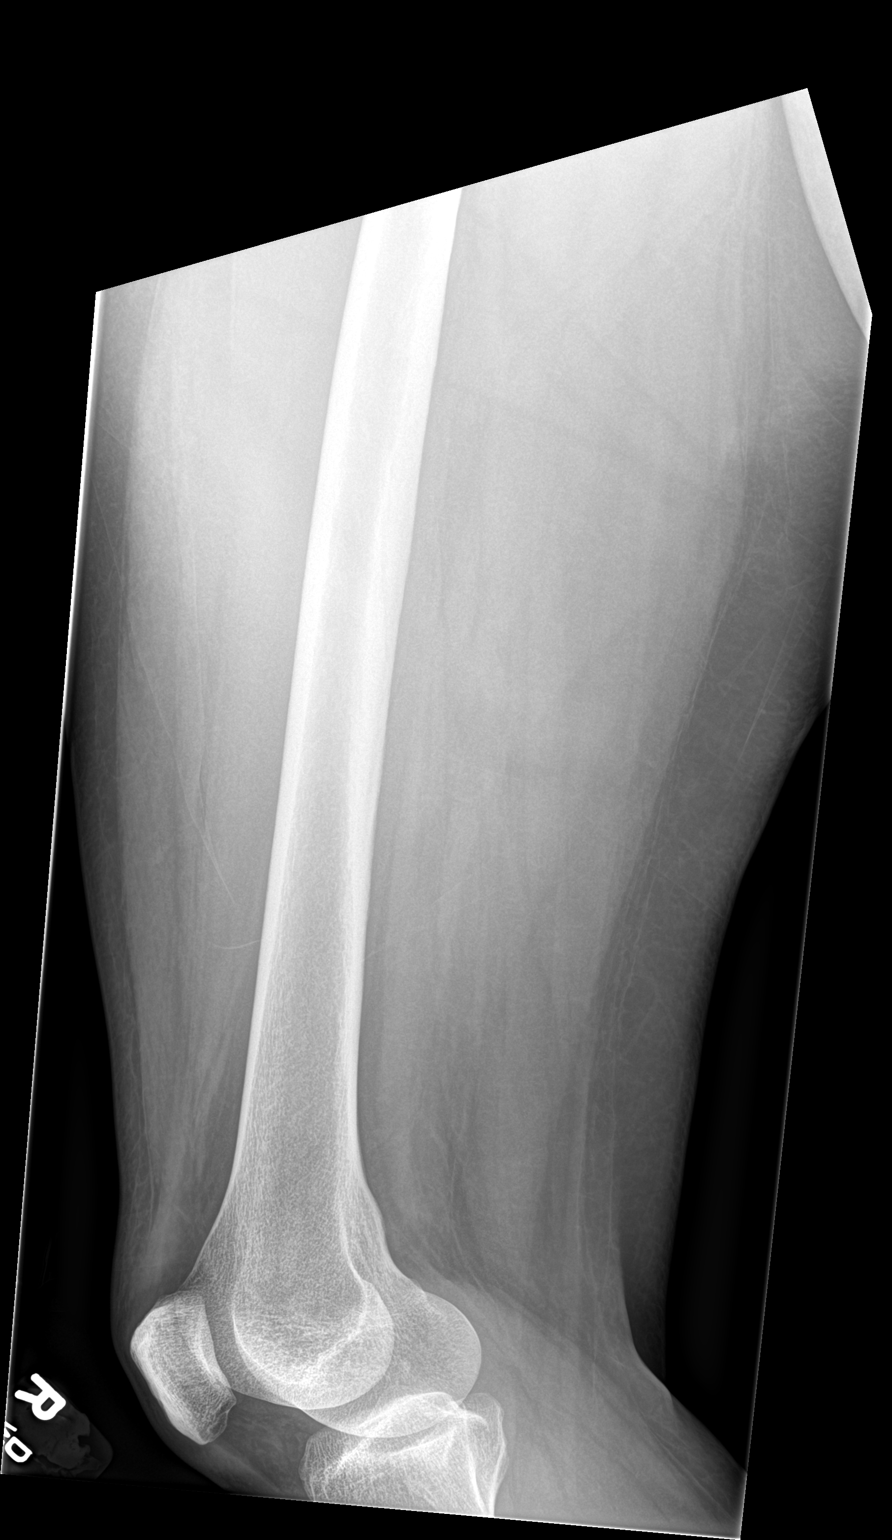

[4 of 4 positions shown; findings below may reference images not displayed]

FINDINGS: There is no evidence of fracture or other focal bone lesions. Soft
tissues are unremarkable.
IMPRESSION: Negative.

## 2020-10-23 MED ORDER — KETOROLAC TROMETHAMINE 30 MG/ML IJ SOLN
30.0000 mg | Freq: Once | INTRAMUSCULAR | Status: AC
  Administered 2020-10-23: 30 mg via INTRAVENOUS
  Filled 2020-10-23: qty 1

## 2020-10-23 MED ORDER — HYDROCODONE-ACETAMINOPHEN 5-325 MG PO TABS
2.0000 | ORAL_TABLET | Freq: Once | ORAL | Status: AC
  Administered 2020-10-23: 2 via ORAL
  Filled 2020-10-23: qty 2

## 2020-10-23 MED ORDER — HYDROCODONE-ACETAMINOPHEN 5-325 MG PO TABS: 1.0000 | ORAL_TABLET | Freq: Four times a day (QID) | ORAL | 0 refills | Status: DC | PRN

## 2020-10-23 NOTE — ED Provider Notes (Signed)
[facility] EMERGENCY DEPT Provider Note   CSN: [id number] Arrival date & time: 10/22/20  2145     History Chief Complaint  Patient presents with   Leg Pain    Chelsea Mathis is a 30 y.o. female.  The history is provided by the patient.  Leg Pain Location:  Leg Leg location:  R upper leg Pain details:    Quality:  Aching and cramping   Radiates to:  Does not radiate   Severity:  Severe   Onset quality:  Gradual   Timing:  Constant   Progression:  Worsening Chronicity:  New Relieved by:  Nothing Worsened by:  Activity and bearing weight Ineffective treatments: toradol. Associated symptoms: no back pain, no fever, no muscle weakness, no numbness and no swelling   Patient presents with right leg pain for over a week.  She denies any trauma, but does report that she did heavy lifting for her job recently.  She was seen in urgent care on July 17 and was started on Toradol but it has not relieved the pain No weakness/numbness in the leg.  No back pain. No previous history of blood clots.    Past Medical History:  Diagnosis Date   Gestational diabetes     Patient Active Problem List   Diagnosis Date Noted   Acute appendicitis 11/10/2016   Postpartum care following vaginal delivery 05/13/2015   Gestational diabetes mellitus (GDM) affecting pregnancy 05/13/2015    Past Surgical History:  Procedure Laterality Date   LAPAROSCOPIC APPENDECTOMY N/A 11/11/2016   Procedure: APPENDECTOMY LAPAROSCOPIC;  Surgeon: Violeta Gelinas, MD;  Location: [facility] OR;  Service: General;  Laterality: N/A;     OB History     Gravida  3   Para  2   Term  2   Preterm      AB  1   Living  2      SAB      IAB      Ectopic      Multiple  0   Live Births  2           Family History  Problem Relation Age of Onset   Diabetes Mother    Hypertension Mother    Diabetes Maternal Uncle    Diabetes Brother    Stroke Maternal Grandmother    Leukemia Paternal  Grandmother    Leukemia Paternal Grandfather     Social History   Tobacco Use   Smoking status: Never   Smokeless tobacco: Never  Substance Use Topics   Alcohol use: No    Alcohol/week: 0.0 standard drinks   Drug use: No    Home Medications Prior to Admission medications   Medication Sig Start Date End Date Taking? Authorizing Provider  acetaminophen (TYLENOL) 500 MG tablet Take 1,000 mg by mouth every 6 (six) hours as needed for moderate pain or headache.    [provider]  metFORMIN (GLUCOPHAGE) 500 MG tablet Take 1 tablet (500 mg total) by mouth 2 (two) times daily with a meal. 09/23/20 10/23/20  Cardama, Amadeo Garnet, MD    Allergies    Patient has no known allergies.  Review of Systems   Review of Systems  Constitutional:  Negative for fever.  Respiratory:  Negative for shortness of breath.   Cardiovascular:  Negative for chest pain and leg swelling.  Musculoskeletal:  Positive for arthralgias and myalgias. Negative for back pain.  Neurological:  Negative for weakness and numbness.  All other systems reviewed and  are negative.  Physical Exam Updated Vital Signs BP 132/74 (BP Location: Right Arm)   Pulse 94   Temp 98.3 F (36.8 C) (Oral)   Resp 20   Ht 1.626 m (5\' 4" )   Wt 90.3 kg   SpO2 100%   BMI 34.16 kg/m   Physical Exam CONSTITUTIONAL: Well developed/well nourished, anxious and crying HEAD: Normocephalic/atraumatic EYES: EOMI/PERRL ENMT: Mucous membranes moist NECK: supple no meningeal signs SPINE/BACK:entire spine nontender CV: S1/S2 noted, no murmurs/rubs/gallops noted LUNGS: Lungs are clear to auscultation bilaterally, no apparent distress ABDOMEN: soft, nontender, no rebound or guarding, bowel sounds noted throughout abdomen GU:no cva tenderness NEURO: Pt is awake/alert/appropriate, moves all extremitiesx4.  No facial droop.  Full range of motion bilateral lower extremities, but right leg is limited due to pain EXTREMITIES: pulses  normal/equal, full ROM, distal pulses equal and intact.  Significant tenderness noted to right anterior thigh.  There is no edema, no erythema, no deformities.  No bruising.  No crepitus.  No calf tenderness or edema on right lower extremity SKIN: warm, color normal PSYCH: Anxious and crying  ED Results / Procedures / Treatments   Labs (all labs ordered are listed, but only abnormal results are displayed) Labs Reviewed  BASIC METABOLIC PANEL - Abnormal; Notable for the following components:      Result Value   CO2 21 (*)    Glucose, Bld 278 (*)    BUN 5 (*)    Creatinine, Ser 0.42 (*)    Calcium 8.7 (*)    All other components within normal limits  CBC WITH DIFFERENTIAL/PLATELET - Abnormal; Notable for the following components:   WBC 16.2 (*)    Hemoglobin 11.2 (*)    HCT 34.4 (*)    Platelets 442 (*)    Neutro Abs 13.2 (*)    All other components within normal limits  HCG, QUANTITATIVE, PREGNANCY  CK  LACTIC ACID, PLASMA    EKG None  Radiology DG Femur Min 2 Views Right  Result Date: 10/23/2020 CLINICAL DATA:  Atraumatic right upper thigh/groin pain x 10 days. EXAM: RIGHT FEMUR 2 VIEWS COMPARISON:  None. FINDINGS: There is no evidence of fracture or other focal bone lesions. Soft tissues are unremarkable. IMPRESSION: Negative. Electronically Signed   By: 10/25/2020 M.D.   On: 10/23/2020 03:03    Procedures Procedures   Medications Ordered in ED Medications  HYDROcodone-acetaminophen (NORCO/VICODIN) 5-325 MG per tablet 2 tablet (has no administration in time range)    ED Course  I have reviewed the triage vital signs and the nursing notes.  Pertinent labs & imaging results that were available during my care of the patient were reviewed by me and considered in my medical decision making (see chart for details).    MDM Rules/Calculators/A&P                           2:11 AM This is patient's second visit for right thigh pain of unclear etiology.  Patient  has pain out of proportion to exam.  Since this is patient's second ER visit, will proceed with x-rays and labs. 0435am Patient reports some improvement but still in pain.  Extensive work-up was overall unrevealing except for mild hyperglycemia.  Patient reports she is not currently taking her metformin. No signs of any acute neurovascular compromise.  No signs of infectious etiology.  No occult fracture.  Patient will use crutches, start short course of pain medication, will return for  DVT study.  If this is negative she can follow-up as an outpatient with sports medicine Final Clinical Impression(s) / ED Diagnoses Final diagnoses:  Right leg pain    Rx / DC Orders ED Discharge Orders          Ordered    VAS [place] LOWER EXTREMITY VENOUS (DVT)  Status:  Canceled        10/23/20 0430    VAS [place] LOWER EXTREMITY VENOUS (DVT)  Status:  Canceled        10/23/20 0432    VAS [place] LOWER EXTREMITY VENOUS (DVT)        10/23/20 0435    HYDROcodone-acetaminophen (NORCO/VICODIN) 5-325 MG tablet  Every 6 hours PRN        10/23/20 0436             Zadie Rhine, MD 10/23/20 [phone]

## 2020-10-29 ENCOUNTER — Emergency Department (HOSPITAL_COMMUNITY)
Admission: EM | Admit: 2020-10-29 | Discharge: 2020-10-30 | Disposition: A | Discharge status: Home / Self Care | Payer: Medicaid Other | Source: Home / Self Care

## 2020-10-29 ENCOUNTER — Other Ambulatory Visit: Payer: Self-pay

## 2020-10-29 DIAGNOSIS — Z5321 Procedure and treatment not carried out due to patient leaving prior to being seen by health care provider: Secondary | ICD-10-CM | POA: Insufficient documentation

## 2020-10-29 DIAGNOSIS — M549 Dorsalgia, unspecified: Secondary | ICD-10-CM | POA: Insufficient documentation

## 2020-10-29 DIAGNOSIS — R1031 Right lower quadrant pain: Secondary | ICD-10-CM | POA: Insufficient documentation

## 2020-10-29 NOTE — ED Triage Notes (Signed)
Pt has had pain like a "pulled muscle" in her right groin and back and down her right leg.  Has been seen at urgent care and given prednisone for suspected sciatica.  Pt states she finished that 5 days ago and feels worse.  States she has pain all over and it hurts to move.  Some nausea.

## 2020-10-31 ENCOUNTER — Emergency Department (HOSPITAL_COMMUNITY): Payer: Medicaid Other

## 2020-10-31 ENCOUNTER — Encounter (HOSPITAL_COMMUNITY): Payer: Self-pay | Admitting: Pulmonary Disease

## 2020-10-31 ENCOUNTER — Inpatient Hospital Stay (HOSPITAL_COMMUNITY)
Admission: EM | Admit: 2020-10-31 | Discharge: 2020-11-11 | DRG: 871 | Disposition: A | Discharge status: Rehabilitation Facility | Payer: Medicaid Other | Attending: Internal Medicine | Admitting: Internal Medicine

## 2020-10-31 ENCOUNTER — Other Ambulatory Visit: Payer: Self-pay

## 2020-10-31 DIAGNOSIS — E872 Acidosis, unspecified: Secondary | ICD-10-CM

## 2020-10-31 DIAGNOSIS — E861 Hypovolemia: Secondary | ICD-10-CM | POA: Diagnosis present

## 2020-10-31 DIAGNOSIS — R5381 Other malaise: Secondary | ICD-10-CM | POA: Diagnosis not present

## 2020-10-31 DIAGNOSIS — B9561 Methicillin susceptible Staphylococcus aureus infection as the cause of diseases classified elsewhere: Secondary | ICD-10-CM | POA: Diagnosis not present

## 2020-10-31 DIAGNOSIS — F4323 Adjustment disorder with mixed anxiety and depressed mood: Secondary | ICD-10-CM

## 2020-10-31 DIAGNOSIS — E871 Hypo-osmolality and hyponatremia: Secondary | ICD-10-CM | POA: Diagnosis present

## 2020-10-31 DIAGNOSIS — Z833 Family history of diabetes mellitus: Secondary | ICD-10-CM | POA: Diagnosis not present

## 2020-10-31 DIAGNOSIS — I2690 Septic pulmonary embolism without acute cor pulmonale: Secondary | ICD-10-CM

## 2020-10-31 DIAGNOSIS — R600 Localized edema: Secondary | ICD-10-CM

## 2020-10-31 DIAGNOSIS — M79629 Pain in unspecified upper arm: Secondary | ICD-10-CM | POA: Diagnosis not present

## 2020-10-31 DIAGNOSIS — M609 Myositis, unspecified: Secondary | ICD-10-CM

## 2020-10-31 DIAGNOSIS — M79651 Pain in right thigh: Secondary | ICD-10-CM | POA: Diagnosis not present

## 2020-10-31 DIAGNOSIS — R918 Other nonspecific abnormal finding of lung field: Secondary | ICD-10-CM | POA: Diagnosis present

## 2020-10-31 DIAGNOSIS — I2699 Other pulmonary embolism without acute cor pulmonale: Secondary | ICD-10-CM | POA: Diagnosis not present

## 2020-10-31 DIAGNOSIS — I2602 Saddle embolus of pulmonary artery with acute cor pulmonale: Secondary | ICD-10-CM | POA: Diagnosis not present

## 2020-10-31 DIAGNOSIS — E669 Obesity, unspecified: Secondary | ICD-10-CM | POA: Diagnosis present

## 2020-10-31 DIAGNOSIS — K5900 Constipation, unspecified: Secondary | ICD-10-CM | POA: Diagnosis present

## 2020-10-31 DIAGNOSIS — D62 Acute posthemorrhagic anemia: Secondary | ICD-10-CM | POA: Diagnosis not present

## 2020-10-31 DIAGNOSIS — R7881 Bacteremia: Secondary | ICD-10-CM

## 2020-10-31 DIAGNOSIS — M60005 Infective myositis, unspecified leg: Secondary | ICD-10-CM | POA: Diagnosis not present

## 2020-10-31 DIAGNOSIS — G4700 Insomnia, unspecified: Secondary | ICD-10-CM | POA: Diagnosis present

## 2020-10-31 DIAGNOSIS — D75838 Other thrombocytosis: Secondary | ICD-10-CM | POA: Diagnosis not present

## 2020-10-31 DIAGNOSIS — M7989 Other specified soft tissue disorders: Secondary | ICD-10-CM | POA: Diagnosis not present

## 2020-10-31 DIAGNOSIS — R10819 Abdominal tenderness, unspecified site: Secondary | ICD-10-CM

## 2020-10-31 DIAGNOSIS — Z806 Family history of leukemia: Secondary | ICD-10-CM | POA: Diagnosis not present

## 2020-10-31 DIAGNOSIS — R7309 Other abnormal glucose: Secondary | ICD-10-CM | POA: Diagnosis not present

## 2020-10-31 DIAGNOSIS — N179 Acute kidney failure, unspecified: Secondary | ICD-10-CM | POA: Diagnosis present

## 2020-10-31 DIAGNOSIS — A419 Sepsis, unspecified organism: Secondary | ICD-10-CM | POA: Diagnosis not present

## 2020-10-31 DIAGNOSIS — R0902 Hypoxemia: Secondary | ICD-10-CM | POA: Diagnosis present

## 2020-10-31 DIAGNOSIS — D6489 Other specified anemias: Secondary | ICD-10-CM | POA: Diagnosis present

## 2020-10-31 DIAGNOSIS — D72829 Elevated white blood cell count, unspecified: Secondary | ICD-10-CM | POA: Diagnosis not present

## 2020-10-31 DIAGNOSIS — E1110 Type 2 diabetes mellitus with ketoacidosis without coma: Secondary | ICD-10-CM | POA: Diagnosis present

## 2020-10-31 DIAGNOSIS — I330 Acute and subacute infective endocarditis: Secondary | ICD-10-CM | POA: Diagnosis present

## 2020-10-31 DIAGNOSIS — D6959 Other secondary thrombocytopenia: Secondary | ICD-10-CM | POA: Diagnosis present

## 2020-10-31 DIAGNOSIS — R6520 Severe sepsis without septic shock: Secondary | ICD-10-CM | POA: Diagnosis present

## 2020-10-31 DIAGNOSIS — M60859 Other myositis, unspecified thigh: Secondary | ICD-10-CM | POA: Diagnosis not present

## 2020-10-31 DIAGNOSIS — M60052 Infective myositis, left thigh: Secondary | ICD-10-CM | POA: Diagnosis present

## 2020-10-31 DIAGNOSIS — A4102 Sepsis due to Methicillin resistant Staphylococcus aureus: Secondary | ICD-10-CM | POA: Diagnosis present

## 2020-10-31 DIAGNOSIS — Z8249 Family history of ischemic heart disease and other diseases of the circulatory system: Secondary | ICD-10-CM | POA: Diagnosis not present

## 2020-10-31 DIAGNOSIS — R109 Unspecified abdominal pain: Secondary | ICD-10-CM

## 2020-10-31 DIAGNOSIS — E119 Type 2 diabetes mellitus without complications: Secondary | ICD-10-CM

## 2020-10-31 DIAGNOSIS — B9562 Methicillin resistant Staphylococcus aureus infection as the cause of diseases classified elsewhere: Secondary | ICD-10-CM | POA: Diagnosis not present

## 2020-10-31 DIAGNOSIS — I76 Septic arterial embolism: Secondary | ICD-10-CM | POA: Diagnosis present

## 2020-10-31 DIAGNOSIS — E11649 Type 2 diabetes mellitus with hypoglycemia without coma: Secondary | ICD-10-CM

## 2020-10-31 DIAGNOSIS — Z6839 Body mass index (BMI) 39.0-39.9, adult: Secondary | ICD-10-CM

## 2020-10-31 DIAGNOSIS — Z20822 Contact with and (suspected) exposure to covid-19: Secondary | ICD-10-CM | POA: Diagnosis present

## 2020-10-31 DIAGNOSIS — I480 Paroxysmal atrial fibrillation: Secondary | ICD-10-CM | POA: Diagnosis present

## 2020-10-31 DIAGNOSIS — E1010 Type 1 diabetes mellitus with ketoacidosis without coma: Secondary | ICD-10-CM | POA: Diagnosis not present

## 2020-10-31 DIAGNOSIS — E860 Dehydration: Secondary | ICD-10-CM | POA: Diagnosis present

## 2020-10-31 DIAGNOSIS — M79602 Pain in left arm: Secondary | ICD-10-CM | POA: Diagnosis not present

## 2020-10-31 DIAGNOSIS — J984 Other disorders of lung: Secondary | ICD-10-CM

## 2020-10-31 DIAGNOSIS — E1169 Type 2 diabetes mellitus with other specified complication: Secondary | ICD-10-CM | POA: Diagnosis not present

## 2020-10-31 DIAGNOSIS — M60051 Infective myositis, right thigh: Secondary | ICD-10-CM | POA: Diagnosis not present

## 2020-10-31 DIAGNOSIS — A4101 Sepsis due to Methicillin susceptible Staphylococcus aureus: Secondary | ICD-10-CM | POA: Diagnosis not present

## 2020-10-31 HISTORY — DX: Type 2 diabetes mellitus without complications: E11.9

## 2020-10-31 LAB — CBG MONITORING, ED
Glucose-Capillary: >600 mg/dL (ref 70–99)
Glucose-Capillary: 544 mg/dL (ref 70–99)
Glucose-Capillary: 591 mg/dL (ref 70–99)
Glucose-Capillary: 462 mg/dL — ABNORMAL HIGH (ref 70–99)
Glucose-Capillary: 473 mg/dL — ABNORMAL HIGH (ref 70–99)
Glucose-Capillary: 456 mg/dL — ABNORMAL HIGH (ref 70–99)
Glucose-Capillary: 348 mg/dL — ABNORMAL HIGH (ref 70–99)
Glucose-Capillary: 279 mg/dL — ABNORMAL HIGH (ref 70–99)

## 2020-10-31 LAB — DIC (DISSEMINATED INTRAVASCULAR COAGULATION)PANEL
D-Dimer, Quant: 4.26 ug/mL-FEU — ABNORMAL HIGH (ref 0.00–0.50)
Fibrinogen: 615 mg/dL — ABNORMAL HIGH (ref 210–475)
INR: 1.3 — ABNORMAL HIGH (ref 0.8–1.2)
Platelets: 120 10*3/uL — ABNORMAL LOW (ref 150–400)
Prothrombin Time: 15.9 seconds — ABNORMAL HIGH (ref 11.4–15.2)
Smear Review: NONE SEEN
aPTT: 23 seconds — ABNORMAL LOW (ref 24–36)

## 2020-10-31 LAB — URINALYSIS, ROUTINE W REFLEX MICROSCOPIC
Bilirubin Urine: NEGATIVE
Glucose, UA: >=500 mg/dL — AB
Ketones, ur: >80 mg/dL — AB
Leukocytes,Ua: NEGATIVE
Nitrite: NEGATIVE
Protein, ur: NEGATIVE mg/dL
Specific Gravity, Urine: 1.02 (ref 1.005–1.030)
pH: 5 (ref 5.0–8.0)

## 2020-10-31 LAB — APTT: aPTT: 23 seconds — ABNORMAL LOW (ref 24–36)

## 2020-10-31 LAB — I-STAT VENOUS BLOOD GAS, ED
Acid-base deficit: 13 mmol/L — ABNORMAL HIGH (ref 0.0–2.0)
Bicarbonate: 11.2 mmol/L — ABNORMAL LOW (ref 20.0–28.0)
Calcium, Ion: 0.93 mmol/L — ABNORMAL LOW (ref 1.15–1.40)
HCT: 41 % (ref 36.0–46.0)
Hemoglobin: 13.9 g/dL (ref 12.0–15.0)
O2 Saturation: 92 %
Potassium: 4.8 mmol/L (ref 3.5–5.1)
Sodium: 120 mmol/L — ABNORMAL LOW (ref 135–145)
TCO2: 12 mmol/L — ABNORMAL LOW (ref 22–32)
pCO2, Ven: 23.3 mmHg — ABNORMAL LOW (ref 44.0–60.0)
pH, Ven: 7.288 (ref 7.250–7.430)
pO2, Ven: 69 mmHg — ABNORMAL HIGH (ref 32.0–45.0)

## 2020-10-31 LAB — CBC WITH DIFFERENTIAL/PLATELET
Abs Immature Granulocytes: 1.5 10*3/uL — ABNORMAL HIGH (ref 0.00–0.07)
Basophils Absolute: 0 10*3/uL (ref 0.0–0.1)
Basophils Relative: 0 %
Eosinophils Absolute: 0 10*3/uL (ref 0.0–0.5)
Eosinophils Relative: 0 %
HCT: 42.7 % (ref 36.0–46.0)
Hemoglobin: 12.5 g/dL (ref 12.0–15.0)
Lymphocytes Relative: 7 %
Lymphs Abs: 1.7 10*3/uL (ref 0.7–4.0)
MCH: 25.9 pg — ABNORMAL LOW (ref 26.0–34.0)
MCHC: 29.3 g/dL — ABNORMAL LOW (ref 30.0–36.0)
MCV: 88.6 fL (ref 80.0–100.0)
Monocytes Absolute: 0 10*3/uL — ABNORMAL LOW (ref 0.1–1.0)
Monocytes Relative: 0 %
Myelocytes: 5 %
Neutro Abs: 21.7 10*3/uL — ABNORMAL HIGH (ref 1.7–7.7)
Neutrophils Relative %: 87 %
Platelets: 175 10*3/uL (ref 150–400)
Promyelocytes Relative: 1 %
RBC: 4.82 MIL/uL (ref 3.87–5.11)
RDW: 13.3 % (ref 11.5–15.5)
WBC: 24.9 10*3/uL — ABNORMAL HIGH (ref 4.0–10.5)
nRBC: 0 % (ref 0.0–0.2)
nRBC: 0 /100 WBC

## 2020-10-31 LAB — BASIC METABOLIC PANEL
Anion gap: 20 — ABNORMAL HIGH (ref 5–15)
Anion gap: 16 — ABNORMAL HIGH (ref 5–15)
Anion gap: 8 (ref 5–15)
BUN: 34 mg/dL — ABNORMAL HIGH (ref 6–20)
BUN: 26 mg/dL — ABNORMAL HIGH (ref 6–20)
BUN: 22 mg/dL — ABNORMAL HIGH (ref 6–20)
CO2: 8 mmol/L — ABNORMAL LOW (ref 22–32)
CO2: 17 mmol/L — ABNORMAL LOW (ref 22–32)
CO2: 24 mmol/L (ref 22–32)
Calcium: 7.2 mg/dL — ABNORMAL LOW (ref 8.9–10.3)
Calcium: 8 mg/dL — ABNORMAL LOW (ref 8.9–10.3)
Calcium: 7.8 mg/dL — ABNORMAL LOW (ref 8.9–10.3)
Chloride: 75 mmol/L — ABNORMAL LOW (ref 98–111)
Chloride: 95 mmol/L — ABNORMAL LOW (ref 98–111)
Chloride: 99 mmol/L (ref 98–111)
Creatinine, Ser: 1.01 mg/dL — ABNORMAL HIGH (ref 0.44–1.00)
Creatinine, Ser: 0.93 mg/dL (ref 0.44–1.00)
Creatinine, Ser: 0.59 mg/dL (ref 0.44–1.00)
GFR, Estimated: >60 mL/min (ref >60)
GFR, Estimated: >60 mL/min (ref >60)
GFR, Estimated: >60 mL/min (ref >60)
Glucose, Bld: 559 mg/dL (ref 70–99)
Glucose, Bld: 314 mg/dL — ABNORMAL HIGH (ref 70–99)
Glucose, Bld: 206 mg/dL — ABNORMAL HIGH (ref 70–99)
Potassium: 4.5 mmol/L (ref 3.5–5.1)
Potassium: 3.5 mmol/L (ref 3.5–5.1)
Potassium: 3.5 mmol/L (ref 3.5–5.1)
Sodium: 103 mmol/L — CL (ref 135–145)
Sodium: 128 mmol/L — ABNORMAL LOW (ref 135–145)
Sodium: 131 mmol/L — ABNORMAL LOW (ref 135–145)

## 2020-10-31 LAB — HEPATIC FUNCTION PANEL
ALT: 15 U/L (ref 0–44)
AST: 40 U/L (ref 15–41)
Albumin: 2.1 g/dL — ABNORMAL LOW (ref 3.5–5.0)
Alkaline Phosphatase: 203 U/L — ABNORMAL HIGH (ref 38–126)
Bilirubin, Direct: 0.5 mg/dL — ABNORMAL HIGH (ref 0.0–0.2)
Indirect Bilirubin: 3.2 mg/dL — ABNORMAL HIGH (ref 0.3–0.9)
Total Bilirubin: 3.7 mg/dL — ABNORMAL HIGH (ref 0.3–1.2)
Total Protein: <3 g/dL — ABNORMAL LOW (ref 6.5–8.1)

## 2020-10-31 LAB — PROCALCITONIN: Procalcitonin: 4.23 ng/mL

## 2020-10-31 LAB — RESP PANEL BY RT-PCR (FLU A&B, COVID) ARPGX2
Influenza A by PCR: NEGATIVE
Influenza B by PCR: NEGATIVE
SARS Coronavirus 2 by RT PCR: NEGATIVE

## 2020-10-31 LAB — SEDIMENTATION RATE: Sed Rate: 55 mm/hr — ABNORMAL HIGH (ref 0–22)

## 2020-10-31 LAB — URINALYSIS, MICROSCOPIC (REFLEX)
Bacteria, UA: NONE SEEN
Squamous Epithelial / HPF: NONE SEEN (ref 0–5)

## 2020-10-31 LAB — OSMOLALITY: Osmolality: 293 mOsm/kg (ref 275–295)

## 2020-10-31 LAB — BLOOD GAS, VENOUS
Acid-base deficit: 0.2 mmol/L (ref 0.0–2.0)
Bicarbonate: 24.1 mmol/L (ref 20.0–28.0)
Drawn by: 5979
FIO2: 28
O2 Saturation: 87 %
Patient temperature: 37
pCO2, Ven: 41.1 mmHg — ABNORMAL LOW (ref 44.0–60.0)
pH, Ven: 7.386 (ref 7.250–7.430)
pO2, Ven: 55.7 mmHg — ABNORMAL HIGH (ref 32.0–45.0)

## 2020-10-31 LAB — HIV ANTIBODY (ROUTINE TESTING W REFLEX): HIV Screen 4th Generation wRfx: NONREACTIVE

## 2020-10-31 LAB — GLUCOSE, CAPILLARY
Glucose-Capillary: 206 mg/dL — ABNORMAL HIGH (ref 70–99)
Glucose-Capillary: 199 mg/dL — ABNORMAL HIGH (ref 70–99)
Glucose-Capillary: 189 mg/dL — ABNORMAL HIGH (ref 70–99)
Glucose-Capillary: 219 mg/dL — ABNORMAL HIGH (ref 70–99)
Glucose-Capillary: 207 mg/dL — ABNORMAL HIGH (ref 70–99)

## 2020-10-31 LAB — BETA-HYDROXYBUTYRIC ACID
Beta-Hydroxybutyric Acid: >8 mmol/L — ABNORMAL HIGH (ref 0.05–0.27)
Beta-Hydroxybutyric Acid: 2.14 mmol/L — ABNORMAL HIGH (ref 0.05–0.27)

## 2020-10-31 LAB — HEMOGLOBIN A1C
Hgb A1c MFr Bld: 12.3 % — ABNORMAL HIGH (ref 4.8–5.6)
Mean Plasma Glucose: 306.31 mg/dL

## 2020-10-31 LAB — MRSA NEXT GEN BY PCR, NASAL: MRSA by PCR Next Gen: DETECTED — AB

## 2020-10-31 LAB — PROTIME-INR
INR: 1.4 — ABNORMAL HIGH (ref 0.8–1.2)
Prothrombin Time: 16.7 seconds — ABNORMAL HIGH (ref 11.4–15.2)

## 2020-10-31 LAB — C-REACTIVE PROTEIN: CRP: 33.9 mg/dL — ABNORMAL HIGH (ref <1.0)

## 2020-10-31 LAB — CK: Total CK: 586 U/L — ABNORMAL HIGH (ref 38–234)

## 2020-10-31 LAB — LACTIC ACID, PLASMA
Lactic Acid, Venous: 4.3 mmol/L (ref 0.5–1.9)
Lactic Acid, Venous: 4.6 mmol/L (ref 0.5–1.9)
Lactic Acid, Venous: 5.6 mmol/L (ref 0.5–1.9)
Lactic Acid, Venous: 4.2 mmol/L (ref 0.5–1.9)

## 2020-10-31 LAB — CORTISOL: Cortisol, Plasma: 43.7 ug/dL

## 2020-10-31 LAB — I-STAT BETA HCG BLOOD, ED (MC, WL, AP ONLY): I-stat hCG, quantitative: 25.1 m[IU]/mL — ABNORMAL HIGH (ref <5)

## 2020-10-31 LAB — TSH: TSH: 0.677 u[IU]/mL (ref 0.350–4.500)

## 2020-10-31 LAB — HCG, SERUM, QUALITATIVE: Preg, Serum: NEGATIVE

## 2020-10-31 IMAGING — CT CT FEMUR *R* W/O CM
3 series · 9 of 33 positions shown, 11 images · non-contrast
Comparison: [DATE] radiographs

CLINICAL DATA: Soft tissue infection of the thigh

EXAM:
CT OF THE LOWER RIGHT EXTREMITY WITHOUT CONTRAST
TECHNIQUE: Multidetector CT imaging of the right lower extremity was performed
according to the standard protocol.

[Series 4: rt femur 1.5 st · axial · 0.60mm/px · z∈[-489,-489]mm · 1 of 353 slices shown, 2 images]
[im 190/353  soft-tissue]
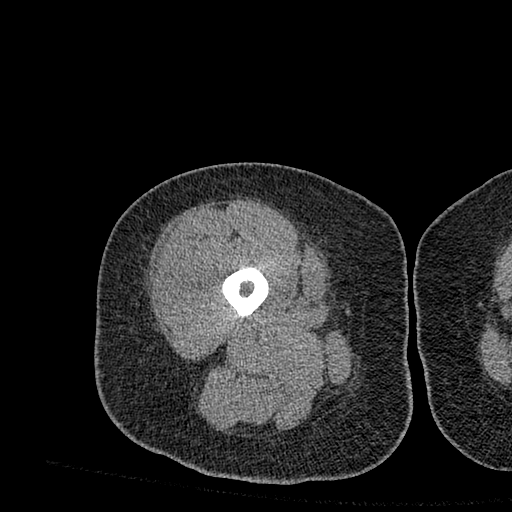
[im 190/353  bone]
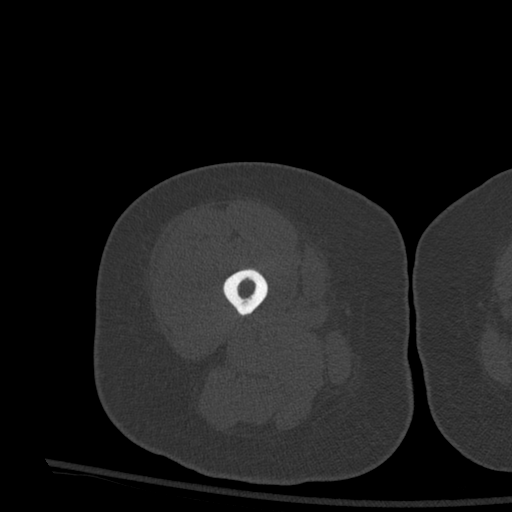

[Series 9: lower ext cor st · coronal · 0.65mm/px · 3 of 142 slices shown]
[im 29/142  bone]
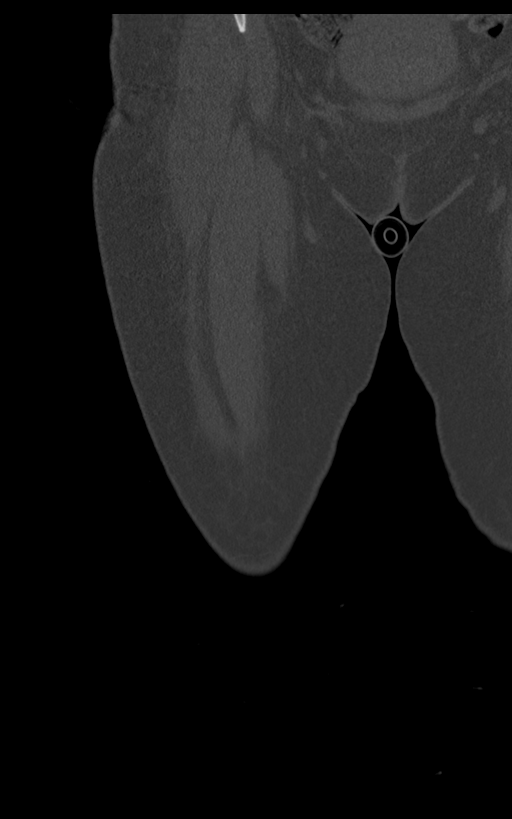
[im 57/142  bone]
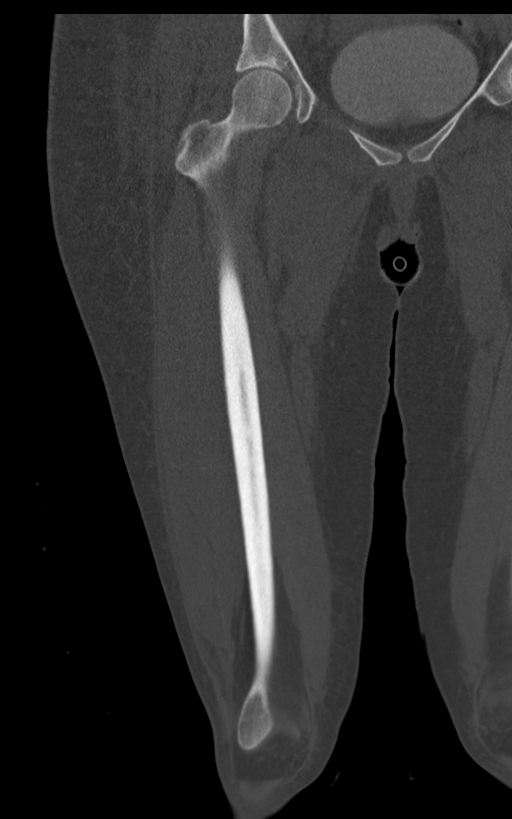
[im 85/142  bone]
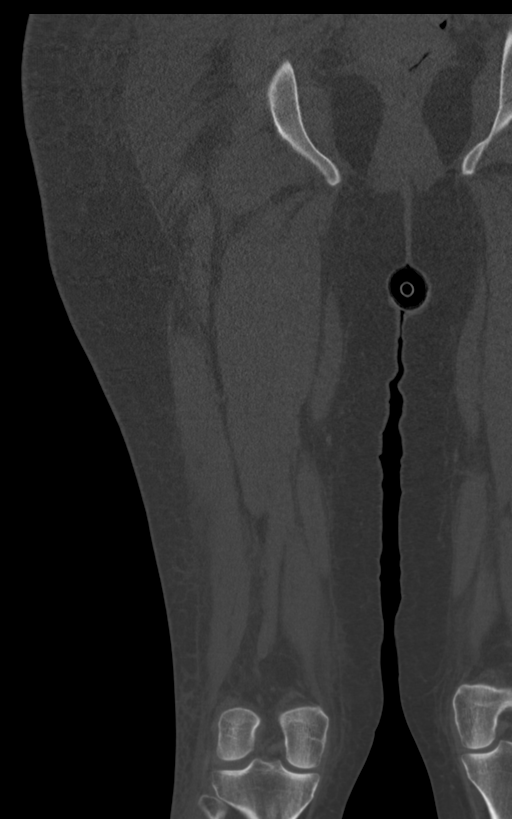

[Series 10: lower ext sag st · sagittal · 0.55mm/px · 5 of 160 slices shown, 6 images]
[im 54/160  bone]
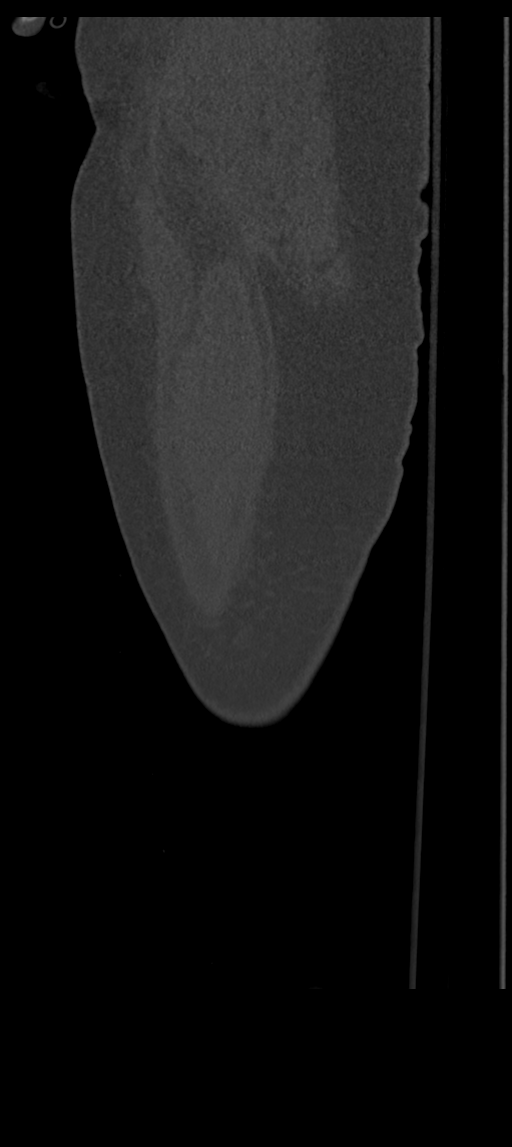
[im 67/160  bone]
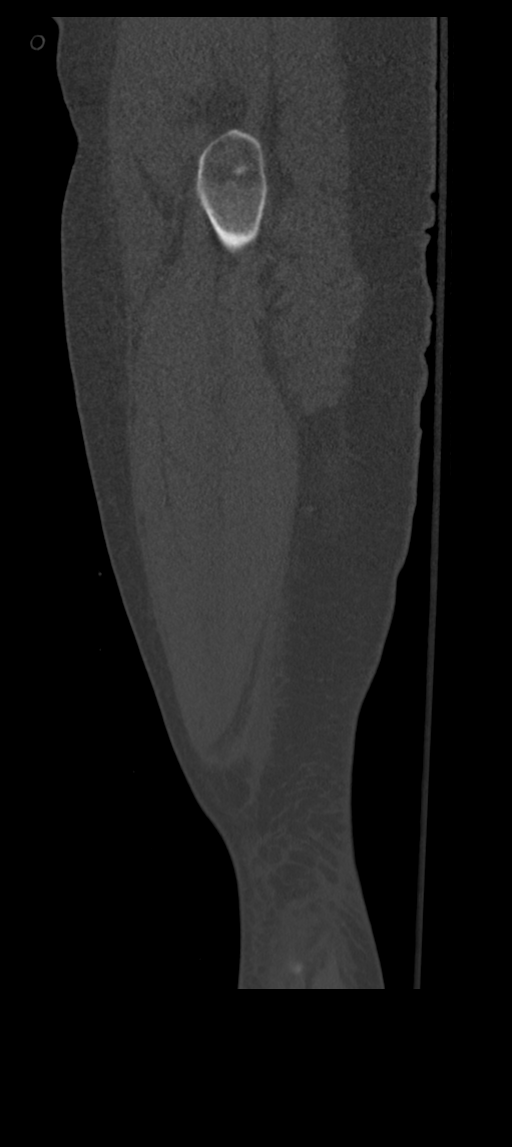
[im 80/160  soft-tissue]
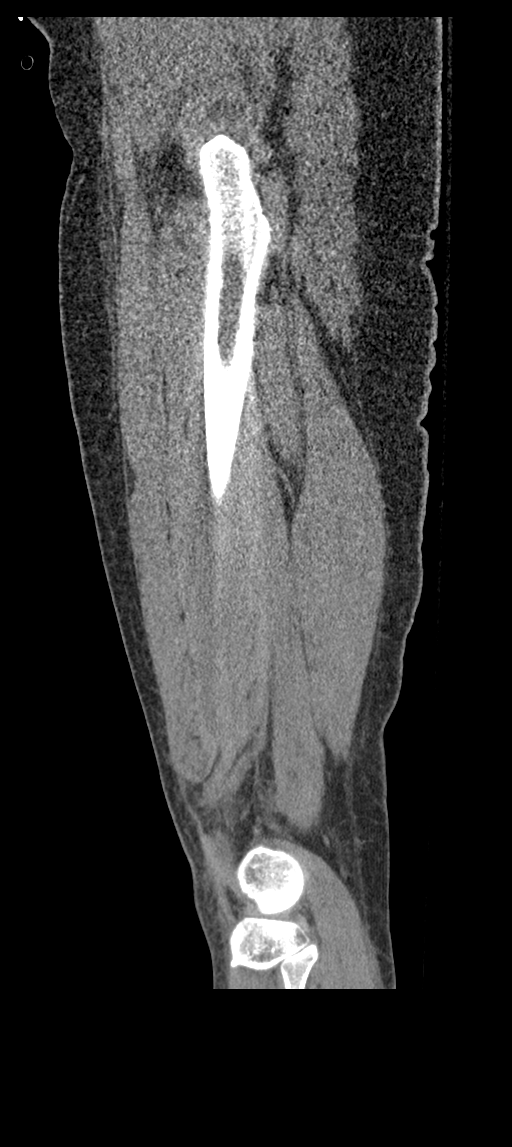
[im 80/160  bone]
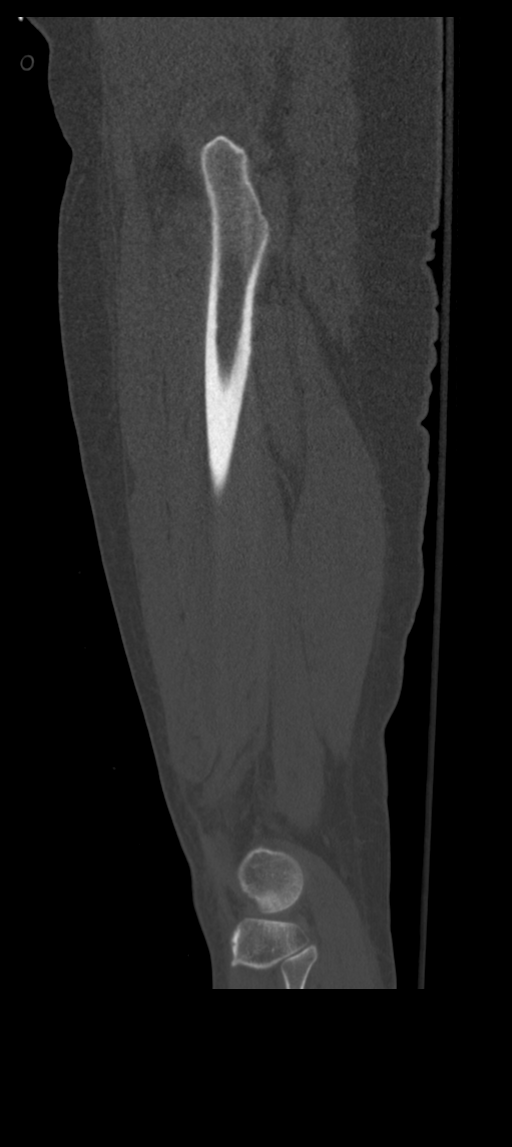
[im 93/160  bone]
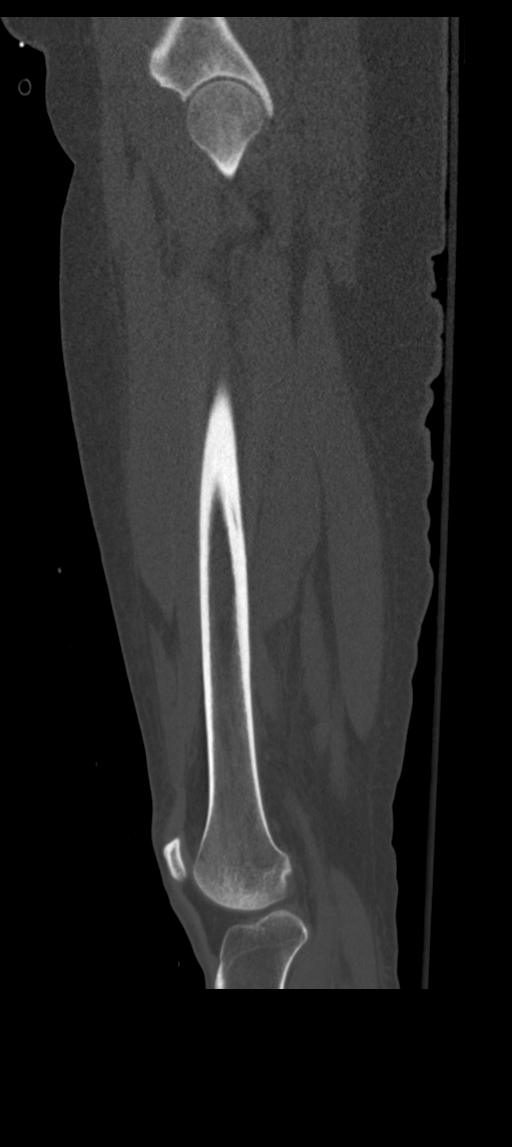
[im 107/160  bone]
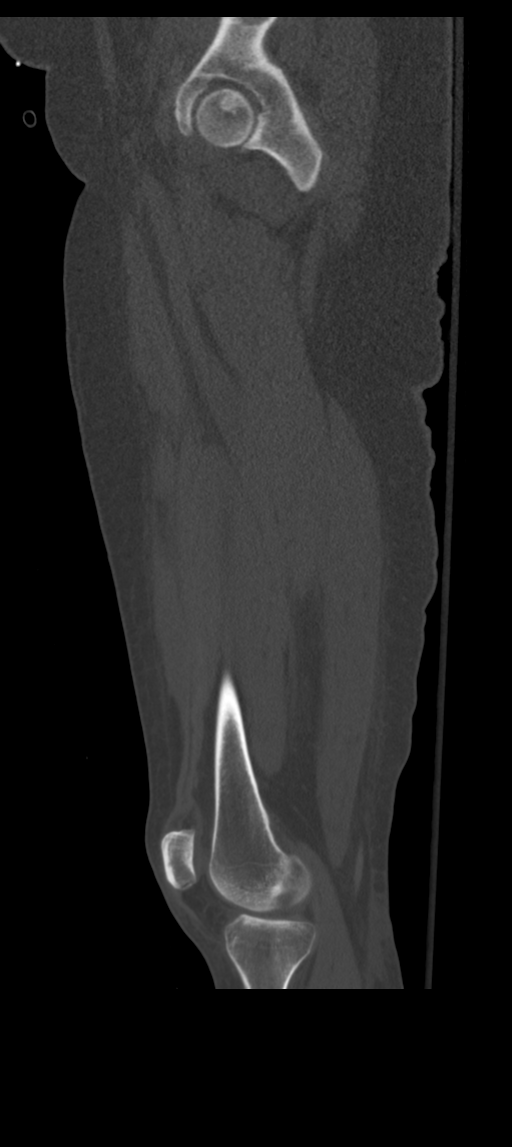

[9 of 33 positions shown; findings below may reference images not displayed]

FINDINGS: Bones/Joint/Cartilage

No bony destructive findings characteristic the right femur. Of
osteomyelitis involving no large hip joint effusion or substantial
knee joint effusion identified.

Ligaments

Suboptimally assessed by CT.

Muscles and Tendons

Subtle infiltrative stranding along deep fascia planes without a
well-defined drainable fluid collection. No gas tracks in the
muscular tissues.

Soft tissues

Subcutaneous edema noted lateral to the right hip. Low-grade
infiltrative edema tracks along the superficial fascia margin of the
thigh laterally. At the level of the knee this is more confluent and
tracks down into the lateral and posterior calf region. There is a
lesser degree of medial subcutaneous edema distally in the thigh
although not substantially asymmetric from the contralateral medial
thigh based on inclusion of the contralateral medial thigh.
IMPRESSION: 1. Low-level edema tracks along the superficial fascia of the thigh
and also there is some mild infiltrative edema along deep fascia
planes in the thigh. No drainable abscess, osteomyelitis, or
substantial joint effusion. This could be a manifestation of
inflammation or third spacing of fluid. No gas in the soft tissues
is identified to indicate a aggressive necrotizing infection;
careful surveillance in a low threshold for reimaging suggested.

## 2020-10-31 IMAGING — DX DG CHEST 1V PORT
1 series · 1 of 1 positions shown · non-contrast
Comparison: None.

CLINICAL DATA: Questionable sepsis

EXAM:
PORTABLE CHEST 1 VIEW

[chest ap]
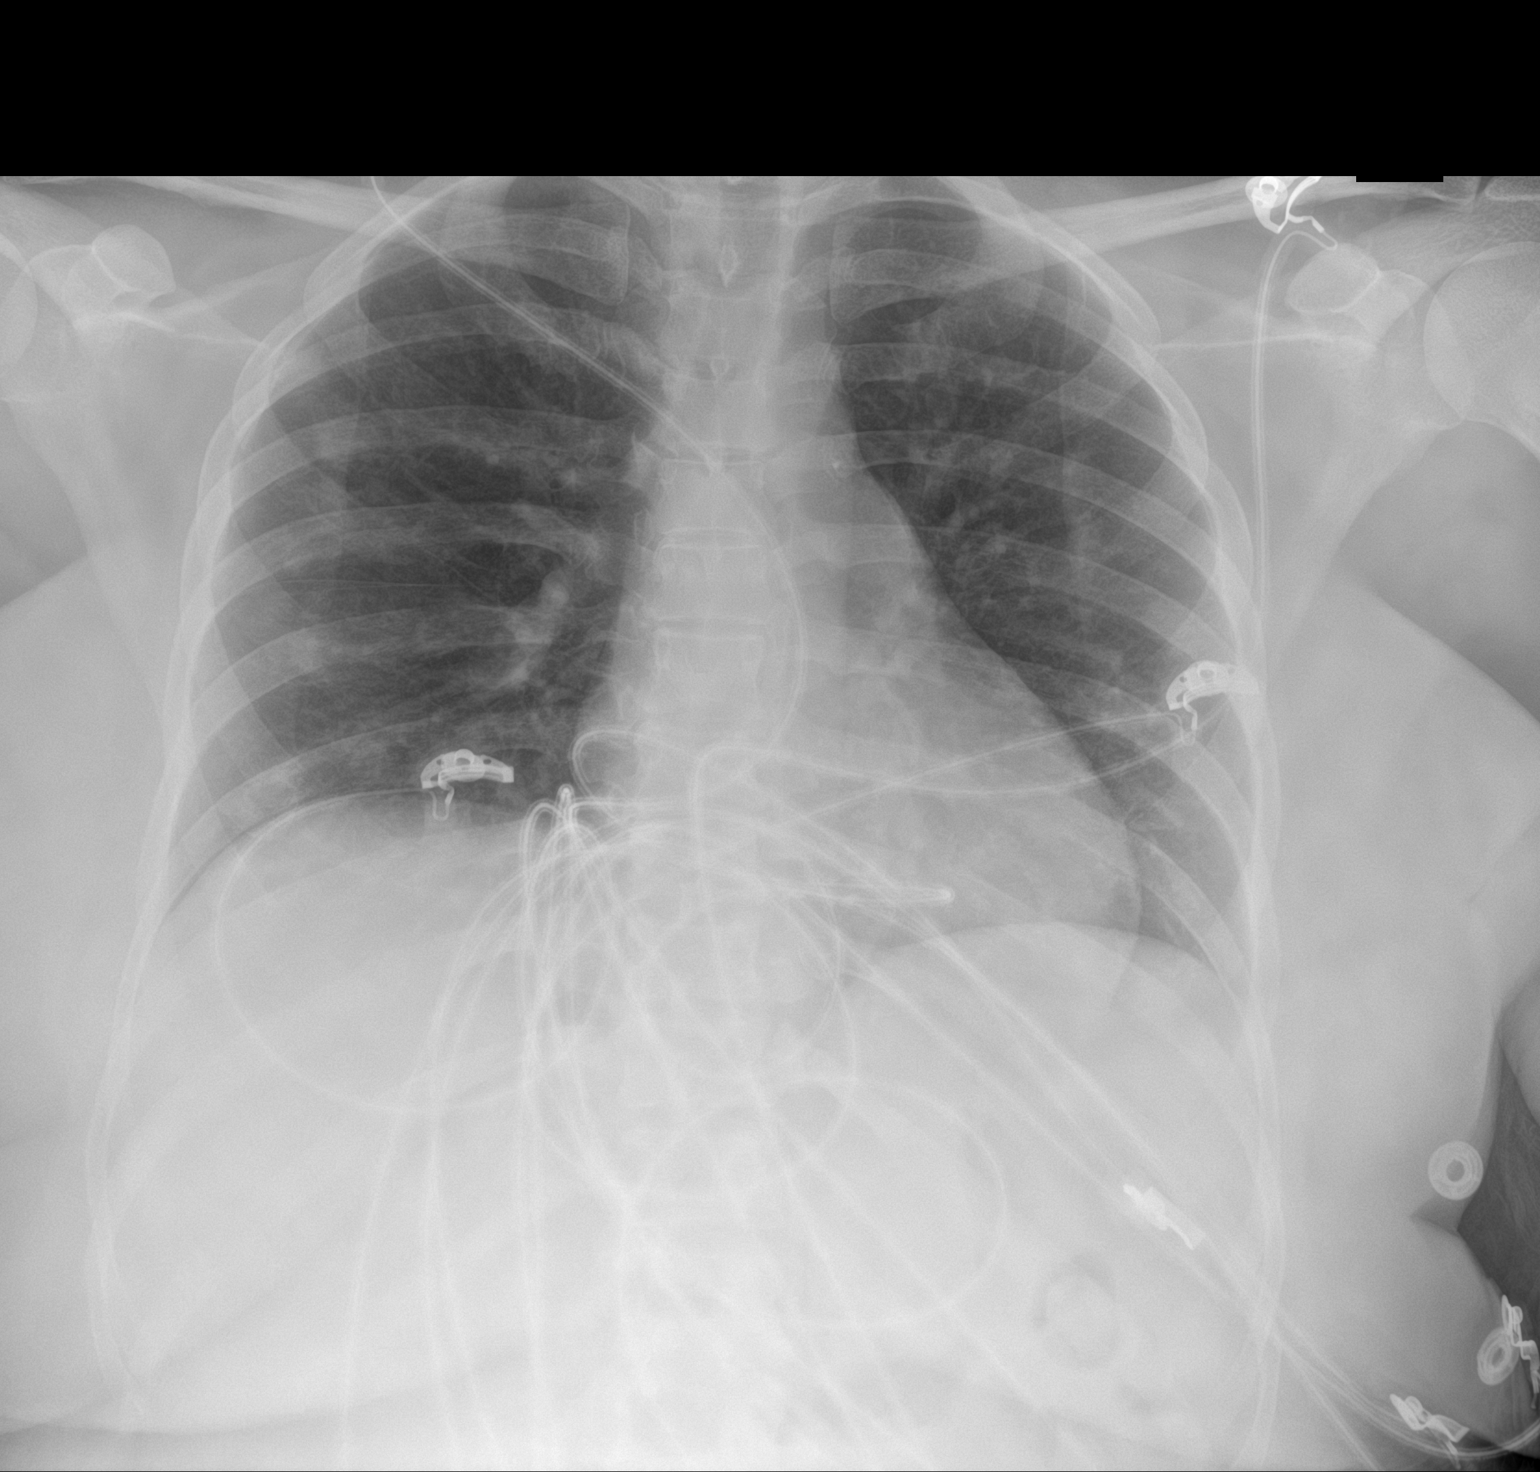

[1 of 1 positions shown; findings below may reference images not displayed]

FINDINGS: Heart is normal size. Patchy bilateral airspace opacities. No
effusions or acute bony abnormality.
IMPRESSION: Patchy bilateral airspace opacities concerning for pneumonia.

## 2020-10-31 MED ORDER — LACTATED RINGERS IV SOLN: INTRAVENOUS | Status: DC

## 2020-10-31 MED ORDER — LACTATED RINGERS IV BOLUS
2000.0000 mL | Freq: Once | INTRAVENOUS | Status: AC
  Administered 2020-10-31: 2000 mL via INTRAVENOUS

## 2020-10-31 MED ORDER — LACTATED RINGERS IV BOLUS
20.0000 mL/kg | Freq: Once | INTRAVENOUS | Status: AC
  Administered 2020-10-31: 1814 mL via INTRAVENOUS

## 2020-10-31 MED ORDER — DOCUSATE SODIUM 100 MG PO CAPS
100.0000 mg | ORAL_CAPSULE | Freq: Two times a day (BID) | ORAL | Status: DC | PRN
  Administered 2020-11-04: 100 mg via ORAL
  Filled 2020-10-31 (×2): qty 1

## 2020-10-31 MED ORDER — POLYETHYLENE GLYCOL 3350 17 G PO PACK
17.0000 g | PACK | Freq: Every day | ORAL | Status: DC | PRN
  Administered 2020-11-04: 17 g via ORAL
  Filled 2020-10-31: qty 1

## 2020-10-31 MED ORDER — VANCOMYCIN HCL IN DEXTROSE 1-5 GM/200ML-% IV SOLN
1000.0000 mg | Freq: Two times a day (BID) | INTRAVENOUS | Status: DC
  Filled 2020-10-31: qty 200

## 2020-10-31 MED ORDER — IOHEXOL 300 MG/ML  SOLN
100.0000 mL | Freq: Once | INTRAMUSCULAR | Status: AC | PRN
  Administered 2020-10-31: 100 mL via INTRAVENOUS

## 2020-10-31 MED ORDER — HYDROMORPHONE HCL 1 MG/ML IJ SOLN
1.0000 mg | INTRAMUSCULAR | Status: DC | PRN
  Administered 2020-10-31: 1 mg via INTRAVENOUS
  Filled 2020-10-31: qty 1

## 2020-10-31 MED ORDER — VANCOMYCIN HCL 10 G IV SOLR
2000.0000 mg | Freq: Once | INTRAVENOUS | Status: AC
  Administered 2020-10-31: 2000 mg via INTRAVENOUS
  Filled 2020-10-31: qty 2000

## 2020-10-31 MED ORDER — DEXTROSE 50 % IV SOLN: 0.0000 mL | INTRAVENOUS | Status: DC | PRN

## 2020-10-31 MED ORDER — PIPERACILLIN-TAZOBACTAM 3.375 G IVPB 30 MIN
3.3750 g | Freq: Once | INTRAVENOUS | Status: AC
  Administered 2020-10-31: 3.375 g via INTRAVENOUS
  Filled 2020-10-31: qty 50

## 2020-10-31 MED ORDER — HEPARIN SODIUM (PORCINE) 5000 UNIT/ML IJ SOLN
5000.0000 [IU] | Freq: Three times a day (TID) | INTRAMUSCULAR | Status: DC
  Administered 2020-10-31 – 2020-11-01 (×4): 5000 [IU] via SUBCUTANEOUS
  Filled 2020-10-31 (×5): qty 1

## 2020-10-31 MED ORDER — HYDROMORPHONE HCL 1 MG/ML IJ SOLN
1.0000 mg | Freq: Once | INTRAMUSCULAR | Status: AC
Start: 2020-10-31 — End: 2020-10-31
  Administered 2020-10-31: 1 mg via INTRAVENOUS
  Filled 2020-10-31: qty 1

## 2020-10-31 MED ORDER — HYDROMORPHONE HCL 1 MG/ML IJ SOLN
0.5000 mg | INTRAMUSCULAR | Status: AC | PRN
Start: 2020-10-31 — End: 2020-11-01
  Administered 2020-10-31 – 2020-11-01 (×8): 0.5 mg via INTRAVENOUS
  Filled 2020-10-31 (×6): qty 0.5
  Filled 2020-10-31: qty 1
  Filled 2020-10-31: qty 0.5

## 2020-10-31 MED ORDER — INSULIN REGULAR(HUMAN) IN NACL 100-0.9 UT/100ML-% IV SOLN
INTRAVENOUS | Status: AC
  Administered 2020-10-31: 5.5 [IU]/h via INTRAVENOUS
  Administered 2020-10-31: 10.5 [IU]/h via INTRAVENOUS
  Filled 2020-10-31 (×3): qty 100

## 2020-10-31 MED ORDER — LACTATED RINGERS IV BOLUS
1000.0000 mL | Freq: Once | INTRAVENOUS | Status: AC
  Administered 2020-10-31: 1000 mL via INTRAVENOUS

## 2020-10-31 MED ORDER — CHLORHEXIDINE GLUCONATE CLOTH 2 % EX PADS
6.0000 | MEDICATED_PAD | Freq: Every day | CUTANEOUS | Status: DC
  Administered 2020-11-01 – 2020-11-11 (×10): 6 via TOPICAL

## 2020-10-31 MED ORDER — DEXTROSE IN LACTATED RINGERS 5 % IV SOLN: INTRAVENOUS | Status: DC

## 2020-10-31 MED ORDER — PIPERACILLIN-TAZOBACTAM 3.375 G IVPB
3.3750 g | Freq: Three times a day (TID) | INTRAVENOUS | Status: DC
  Administered 2020-10-31 – 2020-11-01 (×2): 3.375 g via INTRAVENOUS
  Filled 2020-10-31 (×6): qty 50

## 2020-10-31 MED ORDER — POTASSIUM CHLORIDE 10 MEQ/100ML IV SOLN
10.0000 meq | INTRAVENOUS | Status: AC
  Administered 2020-10-31 (×2): 10 meq via INTRAVENOUS
  Filled 2020-10-31 (×2): qty 100

## 2020-10-31 MED ORDER — CLINDAMYCIN PHOSPHATE 600 MG/50ML IV SOLN
600.0000 mg | Freq: Once | INTRAVENOUS | Status: AC
  Administered 2020-10-31: 600 mg via INTRAVENOUS
  Filled 2020-10-31: qty 50

## 2020-10-31 NOTE — ED Triage Notes (Signed)
Pt here via EMS with c/o hyperglycemia, swelling to hands and legs, generalized pain. Increased weakness and pain today with fall. Recently dx with diabetes. Takes metformin Taking prednisone for pain which has not helped but become worse

## 2020-10-31 NOTE — Progress Notes (Signed)
Pharmacy Antibiotic Note  Chelsea Mathis is a 30 y.o. female admitted on 10/31/2020 with cellulitis. Pharmacy has been consulted for vancomycin dosing. Pt is afebrile and WBC is significantly elevated at 24.9. SCr is normal at 1.01 but pts baseline is approximately 0.5. Lactic acid is also significantly elevated at 4.3.   Plan: Vancomycin 2gm IV x 1 then 1gm IV Q12H F/u renal fxn, C&S, clinical status and peak/trough at [facility] F/u continuation of gram negative coverage  Height: 5\' 4"  (162.6 cm) Weight: 90.7 kg (200 lb) IBW/kg (Calculated) : 54.7  Temp (24hrs), Avg:98.2 F (36.8 C), Min:97.7 F (36.5 C), Max:98.7 F (37.1 C)  Recent Labs  Lab 10/31/20 1021 10/31/20 1041 10/31/20 1057  WBC  --  24.9*  --   CREATININE 1.01*  --   --   LATICACIDVEN  --   --  4.3*    Estimated Creatinine Clearance: 87.2 mL/min (A) (by C-G formula based on SCr of 1.01 mg/dL (H)).    No Known Allergies  Antimicrobials this admission: Vanc 8/1>> Zosyn x 1 8/1 Clinda x 1 8/1  Dose adjustments this admission: N/A  Microbiology results: Pending  Thank you for allowing pharmacy to be a part of this patient's care.  Manvir Thorson, 12/31/20 10/31/2020 12:06 PM

## 2020-10-31 NOTE — Progress Notes (Deleted)
Pharmacy Antibiotic Note  Chelsea Mathis is a 30 y.o. female admitted on 10/31/2020 with cellulitis.  Pharmacy has been consulted for vancomycin dosing.  Plan: Vancomycin 2gm IV x 1 then F/u renal fxn, C&S, clinical status and peak/trough at [facility] F/u continuation of gram negative coverage  Height: 5\' 4"  (162.6 cm) Weight: 90.7 kg (200 lb) IBW/kg (Calculated) : 54.7  Temp (24hrs), Avg:97.7 F (36.5 C), Min:97.7 F (36.5 C), Max:97.7 F (36.5 C)  No results for input(s): WBC, CREATININE, LATICACIDVEN, VANCOTROUGH, VANCOPEAK, VANCORANDOM, GENTTROUGH, GENTPEAK, GENTRANDOM, TOBRATROUGH, TOBRAPEAK, TOBRARND, AMIKACINPEAK, AMIKACINTROU, AMIKACIN in the last 168 hours.  Estimated Creatinine Clearance: 110.1 mL/min (A) (by C-G formula based on SCr of 0.42 mg/dL (L)).    No Known Allergies  Antimicrobials this admission: Vanc 8/1>> Zosyn x 1 8/1 Clinda x 1 8/1  Dose adjustments this admission: N/A  Microbiology results: Pending  Thank you for allowing pharmacy to be a part of this patient's care.  Talise Sligh, 10/31/2020 11:03 AM

## 2020-10-31 NOTE — Progress Notes (Deleted)
Strange story 6/24 L flank pain, UA neg, CT stone prot neg, hyperglycemic, thought [facility] 7/24 R leg pain: x ray fine, labs fine, DVT scan neg, hyperglycemic, given prednisone for presumed ?sciatica 8/1 comes back in with profound diffuse body aches/pains.  Labs c/w DKA.  Pan scan showing cavitary nodules on lungs, ?myositis on R thigh (left thigh not imaged), hyponatremia out of proportion to DKA  No sick contacts / cough / hemoptysis / fevers / N / V / D.  On exam, she appears acutely ill Tachycardic heart, ext warm, +murmur Every joint and area I palpate she screams in pain Lungs clear, mildly tachypneic Mild lower ext nonpitting edema  Lactate elevated, not clearing VBG 7.28 Sodium 103 (corrects to 110 for hyperglycemia) Beta hydroxybutyrate up  A: Constellation of findings (cavitary lung lesions, sepsis features, diffuse myalgias/ MAHA / lactic acidemia/ hyponatermia) most c/w bacteremia; cannot really figure out source but she does have history of not taking tampons out? Tampons noted in prior CT, she claims most recent was yesterday.  EDP to perform pelvic.  Also in differential would be a diffuse rheumatologic process but would have expected some improvement with steroids.  DKA related to poorly controlled DM (new onset) + recent steroid use  P: - Broad spectrum abx - Check DIC panel, Pct, rheum labs - Check echo - Aggressive hydration - f/u pelvic exam - Usual DKA fluids - Watch sodium, no hypertonic for now, suspect reactive to whatever is driving systemic process  45 minutes cc time independent of procedures

## 2020-10-31 NOTE — ED Provider Notes (Signed)
[facility] EMERGENCY DEPARTMENT Provider Note   CSN: [id number] Arrival date & time: 10/31/20  2229     History Chief Complaint  Patient presents with   Hyperglycemia   generalized pain    Chelsea Mathis is a 30 y.o. female.  The history is provided by the patient.  Hyperglycemia Blood sugar level PTA:  >600 Severity:  Severe Onset quality:  Gradual Timing:  Constant Progression:  Worsening Chronicity:  New Diabetes status:  Controlled with oral medications Current diabetic therapy:  Metformin Context: recent illness   Context: not change in medication   Relieved by:  Nothing Ineffective treatments:  None tried Associated symptoms: dehydration, fatigue, malaise and weakness   Associated symptoms: no abdominal pain, no altered mental status, no chest pain, no diaphoresis, no fever, no nausea, no shortness of breath and no vomiting   Risk factors: obesity    30 year old female with a history of diabetes on metformin (currently taking prednisone) who presents to the emergency department with hyperglycemia with a blood glucose greater than 700, diffuse myalgias, right thigh and groin pain that has been worsening over the past few days.  Patient was previously seen in the emergency department at [facility] on 10/23/2020 with right leg pain that been present for over a week.  She been seen previously in urgent care on July 17 and was seen again on the 24th.  Unclear etiology of the pain with reported pain out of proportion to exam noted on physical exam by the ED provider.  DVT ultrasound of the right leg was negative for DVT, x-ray of the right femur was also negative for acute fracture.  Patient states that she was prescribed prednisone for suspected sciatica.  Past Medical History:  Diagnosis Date   Diabetes mellitus (HCC)    Gestational diabetes     Patient Active Problem List   Diagnosis Date Noted   DKA (diabetic ketoacidosis) (HCC) 10/31/2020   Acute  appendicitis 11/10/2016   Postpartum care following vaginal delivery 05/13/2015   Gestational diabetes mellitus (GDM) affecting pregnancy 05/13/2015    Past Surgical History:  Procedure Laterality Date   LAPAROSCOPIC APPENDECTOMY N/A 11/11/2016   Procedure: APPENDECTOMY LAPAROSCOPIC;  Surgeon: Violeta Gelinas, MD;  Location: [facility] OR;  Service: General;  Laterality: N/A;     OB History     Gravida  3   Para  2   Term  2   Preterm      AB  1   Living  2      SAB      IAB      Ectopic      Multiple  0   Live Births  2           Family History  Problem Relation Age of Onset   Diabetes Mother    Hypertension Mother    Diabetes Maternal Uncle    Diabetes Brother    Stroke Maternal Grandmother    Leukemia Paternal Grandmother    Leukemia Paternal Grandfather     Social History   Tobacco Use   Smoking status: Never   Smokeless tobacco: Never  Substance Use Topics   Alcohol use: No    Alcohol/week: 0.0 standard drinks   Drug use: No    Home Medications Prior to Admission medications   Medication Sig Start Date End Date Taking? Authorizing Provider  acetaminophen (TYLENOL) 500 MG tablet Take 1,000 mg by mouth every 6 (six) hours as needed for moderate pain or  headache.    [provider]  HYDROcodone-acetaminophen (NORCO/VICODIN) 5-325 MG tablet Take 1 tablet by mouth every 6 (six) hours as needed. 10/23/20   Zadie Rhine, MD    Allergies    Patient has no known allergies.  Review of Systems   Review of Systems  Constitutional:  Positive for fatigue. Negative for diaphoresis and fever.  Respiratory:  Negative for shortness of breath.   Cardiovascular:  Negative for chest pain.  Gastrointestinal:  Negative for abdominal pain, nausea and vomiting.  Musculoskeletal:  Positive for arthralgias and myalgias.  Skin:  Positive for color change.  Neurological:  Positive for weakness.  All other systems reviewed and are negative.  Physical  Exam Updated Vital Signs BP 127/76   Pulse (!) 133   Temp 98.1 F (36.7 C) (Oral)   Resp (!) 34   Ht 5\' 4"  (1.626 m)   Wt 90.7 kg   LMP 10/31/2020   SpO2 (!) 89%   BMI 34.33 kg/m   Physical Exam Vitals and nursing note reviewed. Exam conducted with a chaperone present.  Constitutional:      General: She is not in acute distress.    Appearance: She is well-developed. She is ill-appearing.     Comments: Uncomfortable appearing complaining of diffuse myalgias  HENT:     Head: Normocephalic and atraumatic.  Eyes:     Conjunctiva/sclera: Conjunctivae normal.  Cardiovascular:     Rate and Rhythm: Normal rate and regular rhythm.     Heart sounds: No murmur heard. Pulmonary:     Effort: Pulmonary effort is normal. No respiratory distress.     Breath sounds: Normal breath sounds.  Abdominal:     Palpations: Abdomen is soft.     Tenderness: There is no abdominal tenderness.  Genitourinary:    Exam position: Supine.     Vagina: Foreign body present. Erythema and tenderness present.     Comments: Tampon in place, subsequently removed. No purulence in the vagina, bright menstrual bleeding noted Musculoskeletal:     Cervical back: Neck supple.     Comments: Tenderness to palpation of the right medial thigh, mild erythema and warmth present.  No fluctuance.  Skin:    General: Skin is warm and dry.  Neurological:     Mental Status: She is alert.    ED Results / Procedures / Treatments   Labs (all labs ordered are listed, but only abnormal results are displayed) Labs Reviewed  BASIC METABOLIC PANEL - Abnormal; Notable for the following components:      Result Value   Sodium 103 (*)    Chloride 75 (*)    CO2 8 (*)    Glucose, Bld 559 (*)    BUN 34 (*)    Creatinine, Ser 1.01 (*)    Calcium 7.2 (*)    Anion gap 20 (*)    All other components within normal limits  BASIC METABOLIC PANEL - Abnormal; Notable for the following components:   Sodium 128 (*)    Chloride 95 (*)     CO2 17 (*)    Glucose, Bld 314 (*)    BUN 26 (*)    Calcium 8.0 (*)    Anion gap 16 (*)    All other components within normal limits  BETA-HYDROXYBUTYRIC ACID - Abnormal; Notable for the following components:   Beta-Hydroxybutyric Acid >8.00 (*)    All other components within normal limits  BETA-HYDROXYBUTYRIC ACID - Abnormal; Notable for the following components:   Beta-Hydroxybutyric Acid  2.14 (*)    All other components within normal limits  HEMOGLOBIN A1C - Abnormal; Notable for the following components:   Hgb A1c MFr Bld 12.3 (*)    All other components within normal limits  URINALYSIS, ROUTINE W REFLEX MICROSCOPIC - Abnormal; Notable for the following components:   Glucose, UA >=500 (*)    Hgb urine dipstick MODERATE (*)    Ketones, ur >80 (*)    All other components within normal limits  LACTIC ACID, PLASMA - Abnormal; Notable for the following components:   Lactic Acid, Venous 4.3 (*)    All other components within normal limits  LACTIC ACID, PLASMA - Abnormal; Notable for the following components:   Lactic Acid, Venous 4.6 (*)    All other components within normal limits  CBC WITH DIFFERENTIAL/PLATELET - Abnormal; Notable for the following components:   WBC 24.9 (*)    MCH 25.9 (*)    MCHC 29.3 (*)    Neutro Abs 21.7 (*)    Monocytes Absolute 0.0 (*)    Abs Immature Granulocytes 1.50 (*)    All other components within normal limits  HEPATIC FUNCTION PANEL - Abnormal; Notable for the following components:   Total Protein <3.0 (*)    Albumin 2.1 (*)    Alkaline Phosphatase 203 (*)    Total Bilirubin 3.7 (*)    Bilirubin, Direct 0.5 (*)    Indirect Bilirubin 3.2 (*)    All other components within normal limits  PROTIME-INR - Abnormal; Notable for the following components:   Prothrombin Time 16.7 (*)    INR 1.4 (*)    All other components within normal limits  APTT - Abnormal; Notable for the following components:   aPTT 23 (*)    All other components within  normal limits  C-REACTIVE PROTEIN - Abnormal; Notable for the following components:   CRP 33.9 (*)    All other components within normal limits  SEDIMENTATION RATE - Abnormal; Notable for the following components:   Sed Rate 55 (*)    All other components within normal limits  LACTIC ACID, PLASMA - Abnormal; Notable for the following components:   Lactic Acid, Venous 5.6 (*)    All other components within normal limits  CK - Abnormal; Notable for the following components:   Total CK 586 (*)    All other components within normal limits  GLUCOSE, CAPILLARY - Abnormal; Notable for the following components:   Glucose-Capillary 206 (*)    All other components within normal limits  GLUCOSE, CAPILLARY - Abnormal; Notable for the following components:   Glucose-Capillary 199 (*)    All other components within normal limits  GLUCOSE, CAPILLARY - Abnormal; Notable for the following components:   Glucose-Capillary 189 (*)    All other components within normal limits  CBG MONITORING, ED - Abnormal; Notable for the following components:   Glucose-Capillary >600 (*)    All other components within normal limits  I-STAT BETA HCG BLOOD, ED ([facility], [facility], [facility] ONLY) - Abnormal; Notable for the following components:   I-stat hCG, quantitative 25.1 (*)    All other components within normal limits  I-STAT VENOUS BLOOD GAS, ED - Abnormal; Notable for the following components:   pCO2, Ven 23.3 (*)    pO2, Ven 69.0 (*)    Bicarbonate 11.2 (*)    TCO2 12 (*)    Acid-base deficit 13.0 (*)    Sodium 120 (*)    Calcium, Ion 0.93 (*)    All  other components within normal limits  CBG MONITORING, ED - Abnormal; Notable for the following components:   Glucose-Capillary 591 (*)    All other components within normal limits  CBG MONITORING, ED - Abnormal; Notable for the following components:   Glucose-Capillary 544 (*)    All other components within normal limits  CBG MONITORING, ED - Abnormal; Notable for the  following components:   Glucose-Capillary 462 (*)    All other components within normal limits  CBG MONITORING, ED - Abnormal; Notable for the following components:   Glucose-Capillary 473 (*)    All other components within normal limits  CBG MONITORING, ED - Abnormal; Notable for the following components:   Glucose-Capillary 456 (*)    All other components within normal limits  CBG MONITORING, ED - Abnormal; Notable for the following components:   Glucose-Capillary 348 (*)    All other components within normal limits  CBG MONITORING, ED - Abnormal; Notable for the following components:   Glucose-Capillary 279 (*)    All other components within normal limits  RESP PANEL BY RT-PCR (FLU A&B, COVID) ARPGX2  CULTURE, BLOOD (SINGLE)  URINE CULTURE  MRSA NEXT GEN BY PCR, NASAL  URINALYSIS, MICROSCOPIC (REFLEX)  OSMOLALITY  HIV ANTIBODY (ROUTINE TESTING W REFLEX)  PROCALCITONIN  BASIC METABOLIC PANEL  BLOOD GAS, VENOUS  HCG, SERUM, QUALITATIVE  LEGIONELLA PNEUMOPHILA SEROGP 1 UR AG  BASIC METABOLIC PANEL  BETA-HYDROXYBUTYRIC ACID  QUANTIFERON-TB GOLD PLUS  TSH  CORTISOL  OSMOLALITY, URINE  ALDOLASE  ANA W/REFLEX IF POSITIVE  ANCA TITERS  RHEUMATOID FACTOR  PROCALCITONIN  HCG, QUANTITATIVE, PREGNANCY  CBC  LACTIC ACID, PLASMA  DIC (DISSEMINATED INTRAVASCULAR COAGULATION)PANEL  POC URINE PREG, ED    EKG None  Radiology CT CHEST WO CONTRAST  Result Date: 10/31/2020 CLINICAL DATA:  Chest pain and shortness of breath. EXAM: CT CHEST WITHOUT CONTRAST TECHNIQUE: Multidetector CT imaging of the chest was performed following the standard protocol without IV contrast. COMPARISON:  10/31/2020 CT abdomen FINDINGS: Cardiovascular: Unremarkable Mediastinum/Nodes: Unremarkable Lungs/Pleura: Scattered cavitary lesions in both lungs are mostly peripheral in location but otherwise random. Some of the scattered pulmonary nodules are solid and not showing signs of cavitation. The cavitary  lesions are variable wall thickness, some with relatively thin walls and some with thick walls. Index right middle lobe nodule which is partially cavitary measures 1.3 by 1.4 cm on image 68 series 4. Bilobed cavitary posterior basal segment right lower lobe nodule measures 1.5 by 1.2 cm on image 87 series 4 and has thick walls. Upper Abdomen: Unremarkable Musculoskeletal: Unremarkable IMPRESSION: 1. Bilateral solid and cavitary nodules in the lungs. Appearance favors other septic emboli or atypical infectious process such as tuberculosis or fungal infection. Substantially less likely are noninfective granulomatous process such as Wegener granulomatosis, or cavitary metastatic lesions. Electronically Signed   By: Gaylyn Rong M.D.   On: 10/31/2020 14:42   CT ABDOMEN PELVIS W CONTRAST  Addendum Date: 10/31/2020   ADDENDUM REPORT: 10/31/2020 12:21 ADDENDUM: Multifocal cavitary lesions within the right lower lobe, new since 09/23/2020. Consider dedicated CT chest for further evaluation. Findings conveyed to ordering clinician, Dr. Ernie Avena during the time of interpretation of the study. Electronically Signed   By: Roanna Banning MD   On: 10/31/2020 12:21   Result Date: 10/31/2020 CLINICAL DATA:  30 year old female with right lower quadrant abdominal pain. EXAM: CT ABDOMEN AND PELVIS WITH CONTRAST TECHNIQUE: Multidetector CT imaging of the abdomen and pelvis was performed using the standard protocol following bolus  administration of intravenous contrast. CONTRAST:  OMNIPAQUE IOHEXOL 300 MG/ML  SOLN COMPARISON:  CT and pelvis, 09/03/2020 and 11/10/2016. FINDINGS: The examination is mildly degraded secondary to motion artifact. Lower chest: Multifocal cavitary lesions within the right lower lobe, new since 09/23/2020 (see key image). Hepatobiliary: No focal liver abnormality is seen. No gallstones, gallbladder wall thickening, or biliary dilatation. Pancreas: Unremarkable. No pancreatic ductal dilatation  or surrounding inflammatory changes. Spleen: Normal in size without focal abnormality. Adrenals/Urinary Tract: Adrenal glands are unremarkable. Moderate rotational right renal anomaly. Trace right renal collecting system distention. No discrete hydronephrosis. Moderate distention of urinary bladder. Stomach/Bowel: Stomach is within normal limits. Appendix appears normal. No evidence of bowel wall thickening, distention, or inflammatory changes. Vascular/Lymphatic: No significant vascular findings are present. No enlarged abdominal or pelvic lymph nodes. Other: IUD. Tampon. No abdominal wall hernia or abnormality. No abdominopelvic ascites. Musculoskeletal: No acute or significant osseous findings. IMPRESSION: No acute abdominopelvic process. Electronically Signed: By: Roanna Banning MD On: 10/31/2020 12:02   CT FEMUR RIGHT WO CONTRAST  Result Date: 10/31/2020 CLINICAL DATA:  Soft tissue infection of the thigh EXAM: CT OF THE LOWER RIGHT EXTREMITY WITHOUT CONTRAST TECHNIQUE: Multidetector CT imaging of the right lower extremity was performed according to the standard protocol. COMPARISON:  10/23/2020 radiographs FINDINGS: Bones/Joint/Cartilage No bony destructive findings characteristic the right femur. Of osteomyelitis involving no large hip joint effusion or substantial knee joint effusion identified. Ligaments Suboptimally assessed by CT. Muscles and Tendons Subtle infiltrative stranding along deep fascia planes without a well-defined drainable fluid collection. No gas tracks in the muscular tissues. Soft tissues Subcutaneous edema noted lateral to the right hip. Low-grade infiltrative edema tracks along the superficial fascia margin of the thigh laterally. At the level of the knee this is more confluent and tracks down into the lateral and posterior calf region. There is a lesser degree of medial subcutaneous edema distally in the thigh although not substantially asymmetric from the contralateral medial thigh  based on inclusion of the contralateral medial thigh. IMPRESSION: 1. Low-level edema tracks along the superficial fascia of the thigh and also there is some mild infiltrative edema along deep fascia planes in the thigh. No drainable abscess, osteomyelitis, or substantial joint effusion. This could be a manifestation of inflammation or third spacing of fluid. No gas in the soft tissues is identified to indicate a aggressive necrotizing infection; careful surveillance in a low threshold for reimaging suggested. Electronically Signed   By: Gaylyn Rong M.D.   On: 10/31/2020 14:34   DG Chest Port 1 View  Result Date: 10/31/2020 CLINICAL DATA:  Questionable sepsis EXAM: PORTABLE CHEST 1 VIEW COMPARISON:  None. FINDINGS: Heart is normal size. Patchy bilateral airspace opacities. No effusions or acute bony abnormality. IMPRESSION: Patchy bilateral airspace opacities concerning for pneumonia. Electronically Signed   By: Charlett Nose M.D.   On: 10/31/2020 10:53    Procedures Ultrasound ED Peripheral IV (Provider)  Date/Time: 10/31/2020 10:40 AM Performed by: Ernie Avena, MD Authorized by: Ernie Avena, MD   Procedure details:    Indications: hydration and poor IV access     Skin Prep: chlorhexidine gluconate     Location:  Left AC   Angiocath:  20 G   Bedside Ultrasound Guided: Yes     Images: not archived     Patient tolerated procedure without complications: Yes     Dressing applied: Yes   .Critical Care  Date/Time: 10/31/2020 10:55 AM Performed by: Ernie Avena, MD Authorized by: Ernie Avena, MD  Critical care provider statement:    Critical care time (minutes):  62   Critical care was necessary to treat or prevent imminent or life-threatening deterioration of the following conditions:  Sepsis and endocrine crisis   Critical care was time spent personally by me on the following activities:  Discussions with consultants, evaluation of patient's response to treatment, examination  of patient, ordering and performing treatments and interventions, ordering and review of laboratory studies, ordering and review of radiographic studies, pulse oximetry, re-evaluation of patient's condition, obtaining history from patient or surrogate, review of old charts, blood draw for specimens, development of treatment plan with patient or surrogate and vascular access procedures   Medications Ordered in ED Medications  insulin regular, human (MYXREDLIN) 100 units/ 100 mL infusion (5.5 Units/hr Intravenous New Bag/Given 10/31/20 1854)  lactated ringers infusion (has no administration in time range)  dextrose 5 % in lactated ringers infusion ( Intravenous New Bag/Given 10/31/20 1939)  dextrose 50 % solution 0-50 mL (has no administration in time range)  vancomycin (VANCOCIN) IVPB 1000 mg/200 mL premix (has no administration in time range)  docusate sodium (COLACE) capsule 100 mg (has no administration in time range)  polyethylene glycol (MIRALAX / GLYCOLAX) packet 17 g (has no administration in time range)  heparin injection 5,000 Units (has no administration in time range)  HYDROmorphone (DILAUDID) injection 0.5 mg (0.5 mg Intravenous Given 10/31/20 1811)  piperacillin-tazobactam (ZOSYN) IVPB 3.375 g (has no administration in time range)  lactated ringers bolus 1,814 mL (1,814 mLs Intravenous New Bag/Given 10/31/20 1051)  HYDROmorphone (DILAUDID) injection 1 mg (1 mg Intravenous Given 10/31/20 1051)  potassium chloride 10 mEq in 100 mL IVPB (0 mEq Intravenous Stopped 10/31/20 1409)  clindamycin (CLEOCIN) IVPB 600 mg (0 mg Intravenous Stopped 10/31/20 1222)  piperacillin-tazobactam (ZOSYN) IVPB 3.375 g (0 g Intravenous Stopped 10/31/20 1322)  vancomycin (VANCOCIN) 2,000 mg in sodium chloride 0.9 % 500 mL IVPB (0 mg Intravenous Stopped 10/31/20 1714)  iohexol (OMNIPAQUE) 300 MG/ML solution 100 mL (100 mLs Intravenous Contrast Given 10/31/20 1126)  lactated ringers bolus 1,000 mL (1,000 mLs Intravenous New  Bag/Given 10/31/20 1642)  lactated ringers bolus 2,000 mL (2,000 mLs Intravenous New Bag/Given 10/31/20 1901)    ED Course  I have reviewed the triage vital signs and the nursing notes.  Pertinent labs & imaging results that were available during my care of the patient were reviewed by me and considered in my medical decision making (see chart for details).    MDM Rules/Calculators/A&P                           29 year old female with diabetes currently managed on metformin with diet control presenting to the emergency department with uncontrolled hyperglycemia in the setting of recent prednisone use and concern for sepsis/infection in the patient's right thigh and groin.  The patient endorses worsening groin pain/right thigh pain over the past few weeks, acutely worsening over the past few days.  She was recently put on prednisone for suspected sciatica.  She has erythema and tenderness to palpation of the right groin without clear fluctuance.  CT of the abdomen pelvis ordered due to concern concern for possible soft tissue infection.   DDx: Sepsis (cellulitis versus thigh abscess versus necrotizing soft tissue infection of the groin), DKA, HHS, hyperglycemia in the setting of acute illness, COVID-19, pneumonia, UTI/pyelonephritis.  Patient CBG greater than 600, on arrival, afebrile by oral temperature, tachycardic to the 110s, mildly tachypneic,  hemodynamically stable and saturating well on room air.  Concern for DKA based on patient presentation in addition to concern for sepsis.  Sepsis protocol initiated on patient arrival.  Patient was administered IV fluids for volume resuscitation after ultrasound-guided IV access was obtained.  IV antibiotics ordered to cover for possible necrotizing soft tissue infection of the groin.   VBG concerning for DKA, insulin GTT ordered.  Additional bolus of IV fluids ordered.  IV antibiotics have been ordered and are running.  CT imaging with low-level edema  tracking along the superficial fascia of the thigh with some mild infiltration of edema along deep fascial planes in the thigh.  No drainable abscess, osteomyelitis or substantial joint effusion.  This could be a manifestation of inflammation or third spacing of fluid.  No gas in the soft tissues to indicate an aggressive necrotizing infection.  Careful surveillance and a low threshold for imaging is suggested.  CT of the chest concerning for bilateral solid and cavitary nodules in the lungs.  Appearance favors other septic emboli or atypical infectious process such as tuberculosis or fungal infection.  Substantially less likely are not infective granulomatous process such as Wegener's or cavitary metastatic lesions.  CT abdomen pelvis with no acute abdominal pelvic process.  Labs concerning in addition to concern for DKA, concerning for sepsis with a lactic acidosis to 4 and a leukocytosis to the high 20s.  Additional concern for hyponatremia to 110 when corrected for hyperglycemia.  Due to multiple metabolic abnormalities, sepsis, DKA, hyponatremia, lactic acidosis and leukocytosis, multiple imaging findings concerning for diffuse infectious processes, critical care was consulted for admission.  A tampon was noted on CT imaging of the abdomen pelvis.  The patient states she may have a placed it this morning.  The tampon was removed on pelvic exam.  No purulence was noted.  The patient was noted to have diffuse tenderness of her thigh and groin on exam.  No purulence noted in the vaginal canal.  Menstrual blood noted once the tampon was removed.     Final Clinical Impression(s) / ED Diagnoses Final diagnoses:  Diabetic ketoacidosis without coma associated with type 2 diabetes mellitus (HCC)  Sepsis with acute renal failure without septic shock, due to unspecified organism, unspecified acute renal failure type (HCC)  Hyponatremia  Lactic acidosis  Pulmonary cavitary lesion  Thigh edema    Rx /  DC Orders ED Discharge Orders     None        Ernie AvenaLawsing, Dontario Evetts, MD 10/31/20 2057

## 2020-10-31 NOTE — Progress Notes (Signed)
Inpatient Diabetes Program Recommendations  AACE/ADA: New Consensus Statement on Inpatient Glycemic Control (2015)  Target Ranges:  Prepandial:   less than 140 mg/dL      Peak postprandial:   less than 180 mg/dL (1-2 hours)      Critically ill patients:  140 - 180 mg/dL   Lab Results  Component Value Date   GLUCAP >600 (HH) 10/31/2020    Review of Glycemic Control Results for JUHI, LAGRANGE (MRN [id number]) as of 10/31/2020 10:41  Ref. Range 10/31/2020 09:42  Glucose-Capillary Latest Ref Range: 70 - 99 mg/dL >629 (HH)   Diabetes history: DM 2-recent dx. Of DM Outpatient Diabetes medications:  Metformin -also has been on Prednisone as outpatient Current orders for Inpatient glycemic control:  IV insulin  Inpatient Diabetes Program Recommendations:    Agree with IV insulin. CO2=8 and AG=20 on admit.  Beta-Hydroxybutyric acid=8.0%.   It appears patient will need insulin at d/c.  Once acidosis is cleared, consider transition to basal/bolus based on patient's weight. May consider Levemir 14 units bid (first dose 2 hours prior to d/c of insulin drip) and Novolog moderate q 4 hours. Will see patient on 11/01/20 to discuss DM diagnosis and insulin.   Thanks,  Beryl Meager, RN, BC-ADM Inpatient Diabetes Coordinator Pager [phone] (8a-5p)

## 2020-10-31 NOTE — Progress Notes (Signed)
Per Dr. Katrinka Blazing, patient does not need to be on airborne precautions.

## 2020-10-31 NOTE — ED Notes (Signed)
Report called to Huntley Dec, RN MICU. Looking for another airborne room. MD also performed pelvic on pt with this RN at bedside

## 2020-10-31 NOTE — ED Notes (Signed)
Pt transported to CT ?

## 2020-10-31 NOTE — Progress Notes (Signed)
Pharmacy Antibiotic Note  Chelsea Mathis is a 30 y.o. female admitted on 10/31/2020 with bacteremia. Pharmacy has been consulted for vancomycin and new consult for Zosyn dosing. Pt is afebrile and WBC is significantly elevated at 24.9. SCr is normal at 1.01 but pts baseline is approximately 0.5. Lactic acid is also significantly elevated at 4.6.   Plan: Zosyn 3.375 g q8h (4h infusion) Vancomycin 2gm IV x 1 then 1gm IV Q12H F/u renal fxn, C&S, clinical status and peak/trough at [facility] F/u continuation of gram negative coverage  Height: 5\' 4"  (162.6 cm) Weight: 90.7 kg (200 lb) IBW/kg (Calculated) : 54.7  Temp (24hrs), Avg:98.2 F (36.8 C), Min:97.7 F (36.5 C), Max:98.7 F (37.1 C)  Recent Labs  Lab 10/31/20 1021 10/31/20 1041 10/31/20 1057 10/31/20 1507  WBC  --  24.9*  --   --   CREATININE 1.01*  --   --   --   LATICACIDVEN  --   --  4.3* 4.6*     Estimated Creatinine Clearance: 87.2 mL/min (A) (by C-G formula based on SCr of 1.01 mg/dL (H)).    No Known Allergies  Antimicrobials this admission: Vanc 8/1 >> Zosyn 8/1 >> Clinda x 1 8/1  Microbiology results: BCx and UCx pending   Thank you for allowing pharmacy to be a part of this patient's care.  10/1, PharmD PGY1 Pharmacy Resident  Please check AMION for all [facility] pharmacy phone numbers After 10:00 PM call main pharmacy [phone]

## 2020-10-31 NOTE — H&P (Addendum)
NAMEXitlalli Mathis, MRN:  [id number], DOB:  [birth year], LOS: 0 ADMISSION DATE:  10/31/2020, CONSULTATION DATE:  10/31/20 REFERRING MD: Dr. Armandina Gemma, CHIEF COMPLAINT: Elevated blood sugars  History of Present Illness:  30 y/o F who presented to [facility] on 8/1 with reports of elevated blood sugars, weakness and fatigue.   The patient reports she works for [employer] and picked up a box that was much heavier than she thought it would be - after that, she had right groin pain that increased and wrapped around to hip to her low back. She was seen by an urgent care on 7/2 for UTI and given nitrofurantoin for 5 days.  On 7/17 she was seen again at an UC and felt to have a UTI with hematuria and a muscle spasm in the right hip. She was given a toradol injection and prescription for Augmentin for 5 days. On 7/18 her vaginal panel came back positive for BV and she was given a prescription for Flagyl.  She returned to the ER on 7/24 with right thigh pain of unclear etiology with pain out of proportion to exam.  XRAYs were completed and negative as well as RLE venous duplex which was negative. She was given s short course of pain medication (Norco) and recommended to follow up with Sports Medicine.   The patient returned to the ER on 8/1 with reports of elevated blood sugars, swelling to hands and lower extremities and generalized pain. She fell at home prior to arrival and reported generalized weakness.  Initial exam per EDP again demonstrated pain out of proportion to exam.  Soft tissue infection was a concern and a CT of the right femur and abdomen / pelvis assessed.  CT of the femur showed low-level edema tracking along the superficial fascia of the thigh and some mild infiltrative edema along dep fascia planes in the thigh. Dedicated CT of the abd/pelvis revealed multiple cavitary lesions within the RLL. Of note, it did mention a tampon in place (as did the CT renal study on 6/24).   Patient reports she is on her cycle,  has forgotten and left them in place before, last put one in on 7/31.  Labs were notable for severe hyponatremia with Na 103, K 4.5, Cl 75, glucose 559, BUN 34 / Cr 1.01, AG corrected for albumin 25, alk phos 203, albumin 2.1, indirect bili 3.7, WBC 24.9, Hgb 12.5, platelets 175 and lactic acid 4.6.  She was treated with IVF and insulin gtt in the ER.  Pan cultures were obtained and empiric antibiotics were initiated.   PCCM consulted for evaluation.   Pertinent  Medical History  DM II  Appendectomy 2018  Significant Hospital Events: Including procedures, antibiotic start and stop dates in addition to other pertinent events   8/1 Admit with DKA, multiple electrolyte disturbances   Interim History / Subjective:  As above   Objective   Blood pressure 115/72, pulse (!) 128, temperature 98.7 F (37.1 C), temperature source Rectal, resp. rate (!) 25, height '5\' 4"'  (1.626 m), weight 90.7 kg, last menstrual period 10/31/2020, SpO2 97 %, unknown if currently breastfeeding.        Intake/Output Summary (Last 24 hours) at 10/31/2020 1515 Last data filed at 10/31/2020 1409 Gross per 24 hour  Intake 300 ml  Output --  Net 300 ml   Filed Weights   10/31/20 0940  Weight: 90.7 kg    Examination: General: ill appearing adult female lying in bed in NAD HEENT:  MM pink/dry, white coating on tongue but no thrush, anicteric, tiny white pustules on face (acne?) Neuro: Awakens to voice, speech clear, MAE, generalized weakness CV: s1s2 RRR, gallop noted, ?flow murmur PULM: non-labored on RA, lungs bilaterally clear  GI: soft, bsx4 active  Extremities: warm/dry, non-pitting edema / doughy appearance of skin  Skin: no rashes or open wounds  Resolved Hospital Problem list      Assessment & Plan:   DKA  Albumin 2.1, AG corrects to 25.  Beta hydroxy >8.  Hemoglobin A1c 12.3.  Not taking home metformin.  -admit to ICU  -DKA protocol with frequent BMP  -IVF per protocol  Severe Sepsis  Unclear  etiology. Consider differential of possible bacteremia (differential neutrophil predominant) / poorly controlled DM as prime medium for infection + additional steroid use - recent UTI's that did not clear in terms of symptoms, & myositis.  Noted tampon in CT imaging from 6/24 and current imaging.  May be coincidental but may also be unifying diagnosis.  -empiric vanco + zosyn  -pan culture  -send TSH, limited autoimmune panel (ANA, ANCA, CRP, RF, ESR) -assess HIV -may need TEE to r/o endocarditis  -TTE pending   Hyponatremia  Euvolemic to hypovolemic, probably more related to sepsis. Na corrects to 100 -assess cortisol, TSH, urine + serum osmolality  -follow serum Na on BMP Q4  AKI  Lactic Acidosis  AGMA In setting of suspected sepsis  -Trend BMP / urinary output -Replace electrolytes as indicated -Avoid nephrotoxic agents, ensure adequate renal perfusion  Bilateral Pulmonary Nodules & Cavitary Lesions  Differential includes septic emboli (pt denies IVDA), soft tissue infection / bacteremia given Hgb A1c of 12.3  -empiric abx as above -may need FOB pending work up  -autoimmune eval as above  Thrombocytopenia  Rule out DIC -assess DIC panel  -follow CBC  -peripheral smear   Pain New in last few weeks, generalized  -PRN dilaudid for pain -must balance pain control with airway / neuro exam   Facial Edema on R 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  -work up as above  [employer] (right click and Systems analyst" daily)  Diet/type: NPO DVT prophylaxis: prophylactic heparin  GI prophylaxis: N/A Lines: N/A Foley:  N/A Code Status:  full code Last date of multidisciplinary goals of care discussion - n/a  Labs   CBC: Recent Labs  Lab 10/31/20 1041 10/31/20 1043  WBC 24.9*  --   NEUTROABS 21.7*  --   HGB 12.5 13.9  HCT 42.7 41.0  MCV 88.6  --   PLT 175  --     Basic Metabolic Panel: Recent Labs  Lab 10/31/20 1021 10/31/20 1043  NA 103* 120*  K  4.5 4.8  CL 75*  --   CO2 8*  --   GLUCOSE 559*  --   BUN 34*  --   CREATININE 1.01*  --   CALCIUM 7.2*  --    GFR: Estimated Creatinine Clearance: 87.2 mL/min (A) (by C-G formula based on SCr of 1.01 mg/dL (H)). Recent Labs  Lab 10/31/20 1041 10/31/20 1057  WBC 24.9*  --   LATICACIDVEN  --  4.3*    Liver Function Tests: Recent Labs  Lab 10/31/20 1041  AST 40  ALT 15  ALKPHOS 203*  BILITOT 3.7*  PROT <3.0*  ALBUMIN 2.1*   No results for input(s): LIPASE, AMYLASE in the last 168 hours. No results for input(s): AMMONIA in the last 168 hours.  ABG    Component Value  Date/Time   HCO3 11.2 (L) 10/31/2020 1043   TCO2 12 (L) 10/31/2020 1043   ACIDBASEDEF 13.0 (H) 10/31/2020 1043   O2SAT 92.0 10/31/2020 1043     Coagulation Profile: Recent Labs  Lab 10/31/20 1109  INR 1.4*    Cardiac Enzymes: No results for input(s): CKTOTAL, CKMB, CKMBINDEX, TROPONINI in the last 168 hours.  HbA1C: Hgb A1c MFr Bld  Date/Time Value Ref Range Status  10/31/2020 10:21 AM 12.3 (H) 4.8 - 5.6 % Final    Comment:    (NOTE) Pre diabetes:          5.7%-6.4%  Diabetes:              >6.4%  Glycemic control for   <7.0% adults with diabetes     CBG: Recent Labs  Lab 10/31/20 0942 10/31/20 1238 10/31/20 1323 10/31/20 1416 10/31/20 1426  GLUCAP >600* 591* 544* 462* 473*    Review of Systems: Positives in [place]  Gen: Denies fever, chills, weight change, fatigue, night sweats, weakness  HEENT: Denies blurred vision, double vision, hearing loss, tinnitus, sinus congestion, rhinorrhea, sore throat, neck stiffness, dysphagia PULM: Denies shortness of breath, cough, sputum production, hemoptysis, wheezing CV: Denies chest pain, edema, orthopnea, paroxysmal nocturnal dyspnea, palpitations GI: Denies abdominal pain, nausea, vomiting, diarrhea, hematochezia, melena, constipation, change in bowel habits GU: Denies dysuria, hematuria, polyuria, oliguria, urethral discharge Endocrine:  Denies hot or cold intolerance, polyuria, polyphagia or appetite change Derm: Denies rash, dry skin, scaling or peeling skin change Heme: Denies easy bruising, bleeding, bleeding gums Neuro: Denies headache, numbness, weakness, slurred speech, loss of memory or consciousness MSK: pain in right groin, joint pain   Past Medical History:  She,  has a past medical history of Gestational diabetes.   Surgical History:   Past Surgical History:  Procedure Laterality Date   LAPAROSCOPIC APPENDECTOMY N/A 11/11/2016   Procedure: APPENDECTOMY LAPAROSCOPIC;  Surgeon: Georganna Skeans, MD;  Location: [place];  Service: General;  Laterality: N/A;     Social History:   reports that she has never smoked. She has never used smokeless tobacco. She reports that she does not drink alcohol and does not use drugs.   Family History:  Her family history includes Diabetes in her brother, maternal uncle, and mother; Hypertension in her mother; Leukemia in her paternal grandfather and paternal grandmother; Stroke in her maternal grandmother.   Allergies No Known Allergies   Home Medications  Prior to Admission medications   Medication Sig Start Date End Date Taking? Authorizing Provider  acetaminophen (TYLENOL) 500 MG tablet Take 1,000 mg by mouth every 6 (six) hours as needed for moderate pain or headache.    [provider]  HYDROcodone-acetaminophen (NORCO/VICODIN) 5-325 MG tablet Take 1 tablet by mouth every 6 (six) hours as needed. 10/23/20   Ripley Fraise, MD     Critical care time: 12 minutes     Noe Gens, MSN, APRN, NP-C, AGACNP-BC [facility] 10/31/2020, 3:15 PM   Please see [email or web address] for pager details.   From 7A-7P if no response, please call [phone] After hours, please call ELink [phone]

## 2020-11-01 ENCOUNTER — Inpatient Hospital Stay (HOSPITAL_COMMUNITY): Payer: Medicaid Other

## 2020-11-01 DIAGNOSIS — R7881 Bacteremia: Secondary | ICD-10-CM

## 2020-11-01 DIAGNOSIS — E1110 Type 2 diabetes mellitus with ketoacidosis without coma: Secondary | ICD-10-CM | POA: Diagnosis not present

## 2020-11-01 DIAGNOSIS — A419 Sepsis, unspecified organism: Secondary | ICD-10-CM | POA: Diagnosis not present

## 2020-11-01 LAB — GLUCOSE, CAPILLARY
Glucose-Capillary: 165 mg/dL — ABNORMAL HIGH (ref 70–99)
Glucose-Capillary: 172 mg/dL — ABNORMAL HIGH (ref 70–99)
Glucose-Capillary: 174 mg/dL — ABNORMAL HIGH (ref 70–99)
Glucose-Capillary: 177 mg/dL — ABNORMAL HIGH (ref 70–99)
Glucose-Capillary: 178 mg/dL — ABNORMAL HIGH (ref 70–99)
Glucose-Capillary: 180 mg/dL — ABNORMAL HIGH (ref 70–99)
Glucose-Capillary: 183 mg/dL — ABNORMAL HIGH (ref 70–99)
Glucose-Capillary: 184 mg/dL — ABNORMAL HIGH (ref 70–99)
Glucose-Capillary: 188 mg/dL — ABNORMAL HIGH (ref 70–99)
Glucose-Capillary: 190 mg/dL — ABNORMAL HIGH (ref 70–99)
Glucose-Capillary: 191 mg/dL — ABNORMAL HIGH (ref 70–99)
Glucose-Capillary: 196 mg/dL — ABNORMAL HIGH (ref 70–99)
Glucose-Capillary: 200 mg/dL — ABNORMAL HIGH (ref 70–99)
Glucose-Capillary: 301 mg/dL — ABNORMAL HIGH (ref 70–99)
Glucose-Capillary: 384 mg/dL — ABNORMAL HIGH (ref 70–99)

## 2020-11-01 LAB — BLOOD CULTURE ID PANEL (REFLEXED) - BCID2

## 2020-11-01 LAB — ECHOCARDIOGRAM COMPLETE
Height: 64 in
S' Lateral: 2 cm
Weight: 3308.66 oz

## 2020-11-01 LAB — BASIC METABOLIC PANEL
Anion gap: 10 (ref 5–15)
BUN: 20 mg/dL (ref 6–20)
CO2: 24 mmol/L (ref 22–32)
Calcium: 8.1 mg/dL — ABNORMAL LOW (ref 8.9–10.3)
Chloride: 98 mmol/L (ref 98–111)
Creatinine, Ser: 0.57 mg/dL (ref 0.44–1.00)
GFR, Estimated: >60 mL/min (ref >60)
Glucose, Bld: 194 mg/dL — ABNORMAL HIGH (ref 70–99)
Potassium: 3.5 mmol/L (ref 3.5–5.1)
Sodium: 132 mmol/L — ABNORMAL LOW (ref 135–145)

## 2020-11-01 LAB — CBC
HCT: 30.3 % — ABNORMAL LOW (ref 36.0–46.0)
Hemoglobin: 10.8 g/dL — ABNORMAL LOW (ref 12.0–15.0)
MCH: 26.4 pg (ref 26.0–34.0)
MCHC: 35.6 g/dL (ref 30.0–36.0)
MCV: 74.1 fL — ABNORMAL LOW (ref 80.0–100.0)
Platelets: 102 10*3/uL — ABNORMAL LOW (ref 150–400)
RBC: 4.09 MIL/uL (ref 3.87–5.11)
RDW: 12.5 % (ref 11.5–15.5)
WBC: 18.9 10*3/uL — ABNORMAL HIGH (ref 4.0–10.5)
nRBC: 0 % (ref 0.0–0.2)

## 2020-11-01 LAB — URINE CULTURE

## 2020-11-01 LAB — MAGNESIUM: Magnesium: 1.9 mg/dL (ref 1.7–2.4)

## 2020-11-01 LAB — HEPATIC FUNCTION PANEL
ALT: 21 U/L (ref 0–44)
AST: 55 U/L — ABNORMAL HIGH (ref 15–41)
Albumin: 1.5 g/dL — ABNORMAL LOW (ref 3.5–5.0)
Alkaline Phosphatase: 106 U/L (ref 38–126)
Bilirubin, Direct: 0.3 mg/dL — ABNORMAL HIGH (ref 0.0–0.2)
Indirect Bilirubin: 0.6 mg/dL (ref 0.3–0.9)
Total Bilirubin: 0.9 mg/dL (ref 0.3–1.2)
Total Protein: 5.4 g/dL — ABNORMAL LOW (ref 6.5–8.1)

## 2020-11-01 LAB — PROCALCITONIN: Procalcitonin: 4.66 ng/mL

## 2020-11-01 LAB — HCG, QUANTITATIVE, PREGNANCY: hCG, Beta Chain, Quant, S: <1 m[IU]/mL (ref <5)

## 2020-11-01 LAB — BETA-HYDROXYBUTYRIC ACID: Beta-Hydroxybutyric Acid: 0.11 mmol/L (ref 0.05–0.27)

## 2020-11-01 MED ORDER — METOPROLOL TARTRATE 5 MG/5ML IV SOLN
INTRAVENOUS | Status: AC
  Filled 2020-11-01: qty 5

## 2020-11-01 MED ORDER — LACTATED RINGERS IV SOLN: INTRAVENOUS | Status: DC

## 2020-11-01 MED ORDER — ACETAMINOPHEN 325 MG PO TABS
650.0000 mg | ORAL_TABLET | Freq: Four times a day (QID) | ORAL | Status: DC | PRN
  Administered 2020-11-03 – 2020-11-11 (×6): 650 mg via ORAL
  Filled 2020-11-01 (×6): qty 2

## 2020-11-01 MED ORDER — INSULIN STARTER KIT- PEN NEEDLES (ENGLISH)
1.0000 | Freq: Once | Status: DC
  Filled 2020-11-01: qty 1

## 2020-11-01 MED ORDER — LIVING WELL WITH DIABETES BOOK
Freq: Once | Status: DC
  Filled 2020-11-01: qty 1

## 2020-11-01 MED ORDER — HYDROCODONE-ACETAMINOPHEN 5-325 MG PO TABS
1.0000 | ORAL_TABLET | Freq: Four times a day (QID) | ORAL | Status: DC | PRN
  Administered 2020-11-01 – 2020-11-02 (×3): 1 via ORAL
  Filled 2020-11-01 (×3): qty 1

## 2020-11-01 MED ORDER — AMIODARONE HCL IN DEXTROSE 360-4.14 MG/200ML-% IV SOLN
30.0000 mg/h | INTRAVENOUS | Status: DC
  Administered 2020-11-01 – 2020-11-02 (×2): 30 mg/h via INTRAVENOUS
  Filled 2020-11-01 (×2): qty 200

## 2020-11-01 MED ORDER — INSULIN ASPART 100 UNIT/ML IJ SOLN
0.0000 [IU] | INTRAMUSCULAR | Status: DC
  Administered 2020-11-01: 15 [IU] via SUBCUTANEOUS
  Administered 2020-11-01 – 2020-11-02 (×2): 11 [IU] via SUBCUTANEOUS
  Administered 2020-11-02: 5 [IU] via SUBCUTANEOUS
  Administered 2020-11-02: 11 [IU] via SUBCUTANEOUS
  Administered 2020-11-02: 15 [IU] via SUBCUTANEOUS

## 2020-11-01 MED ORDER — AMIODARONE HCL IN DEXTROSE 360-4.14 MG/200ML-% IV SOLN
INTRAVENOUS | Status: AC
  Filled 2020-11-01: qty 200

## 2020-11-01 MED ORDER — INSULIN GLARGINE-YFGN 100 UNIT/ML ~~LOC~~ SOLN
26.0000 [IU] | Freq: Every day | SUBCUTANEOUS | Status: DC
  Administered 2020-11-01 – 2020-11-02 (×2): 26 [IU] via SUBCUTANEOUS
  Filled 2020-11-01 (×3): qty 0.26

## 2020-11-01 MED ORDER — AMIODARONE LOAD VIA INFUSION
150.0000 mg | Freq: Once | INTRAVENOUS | Status: AC
  Administered 2020-11-01: 150 mg via INTRAVENOUS
  Filled 2020-11-01: qty 83.34

## 2020-11-01 MED ORDER — VANCOMYCIN HCL 10 G IV SOLR
1750.0000 mg | INTRAVENOUS | Status: DC
  Filled 2020-11-01: qty 1750

## 2020-11-01 MED ORDER — VANCOMYCIN HCL 1750 MG/350ML IV SOLN
1750.0000 mg | INTRAVENOUS | Status: DC
  Administered 2020-11-01: 1750 mg via INTRAVENOUS
  Filled 2020-11-01 (×2): qty 350

## 2020-11-01 MED ORDER — ALBUMIN HUMAN 25 % IV SOLN
25.0000 g | Freq: Four times a day (QID) | INTRAVENOUS | Status: AC
  Administered 2020-11-01 – 2020-11-02 (×4): 25 g via INTRAVENOUS
  Filled 2020-11-01 (×5): qty 100

## 2020-11-01 MED ORDER — POTASSIUM CHLORIDE 10 MEQ/100ML IV SOLN
10.0000 meq | INTRAVENOUS | Status: AC
  Administered 2020-11-01 (×4): 10 meq via INTRAVENOUS
  Filled 2020-11-01 (×4): qty 100

## 2020-11-01 MED ORDER — MAGNESIUM SULFATE 2 GM/50ML IV SOLN
2.0000 g | Freq: Once | INTRAVENOUS | Status: AC
  Administered 2020-11-01: 2 g via INTRAVENOUS
  Filled 2020-11-01: qty 50

## 2020-11-01 MED ORDER — AMIODARONE HCL IN DEXTROSE 360-4.14 MG/200ML-% IV SOLN
60.0000 mg/h | INTRAVENOUS | Status: DC
  Administered 2020-11-01: 60 mg/h via INTRAVENOUS
  Filled 2020-11-01: qty 200

## 2020-11-01 MED ORDER — HYDROCODONE-ACETAMINOPHEN 5-325 MG PO TABS
1.0000 | ORAL_TABLET | Freq: Once | ORAL | Status: AC
  Administered 2020-11-02: 1 via ORAL
  Filled 2020-11-01: qty 1

## 2020-11-01 NOTE — Progress Notes (Signed)
Pharmacy Antibiotic Note  Chelsea Mathis is a 30 y.o. female admitted on 10/31/2020 with cellulitis. Pharmacy has been consulted for vancomycin dosing. Pt is afebrile and WBC has trended down to 18.9, but remains elevated. SCr has decreased to 0.57. Vancomycin dose will be adjusted in the setting of improved renal function. Zosyn has been discontinued with confirmed MRSA bacteremia.  Plan: Change Vancomycin to 1750 mg IV every 24 hours to provide estimated AUC of 486 using Scr 0.8 and VD 0.5 Monitor renal function, clinical status and peak/trough at steady state   Height: 5\' 4"  (162.6 cm) Weight: 93.8 kg (206 lb 12.7 oz) IBW/kg (Calculated) : 54.7  Temp (24hrs), Avg:98.3 F (36.8 C), Min:97.5 F (36.4 C), Max:99 F (37.2 C)  Recent Labs  Lab 10/31/20 1021 10/31/20 1041 10/31/20 1057 10/31/20 1507 10/31/20 1700 10/31/20 2056 11/01/20 0122  WBC  --  24.9*  --   --   --   --  18.9*  CREATININE 1.01*  --   --   --  0.93 0.59 0.57  LATICACIDVEN  --   --  4.3* 4.6* 5.6* 4.2*  --      Estimated Creatinine Clearance: 112 mL/min (by C-G formula based on SCr of 0.57 mg/dL).    No Known Allergies  Antimicrobials this admission: Vanc 8/1>> Zosyn 8/1>>8/2 Clinda x 1 8/1  Dose adjustments this admission: Vancomycin 1g every 12 hours (never given)  Microbiology results: 8/1 blood culture: MRSA 8/1 urine culture: pending 8/1 MRSA PCR +  Thank you for allowing pharmacy to participate in this patient's care.  01/01/21, PharmD PGY1 Pharmacy Resident 11/01/2020 9:19 AM Check [email or web address] for unit specific pharmacy number

## 2020-11-01 NOTE — Progress Notes (Signed)
Inpatient Diabetes Program Recommendations  AACE/ADA: New Consensus Statement on Inpatient Glycemic Control (2015)  Target Ranges:  Prepandial:   less than 140 mg/dL      Peak postprandial:   less than 180 mg/dL (1-2 hours)      Critically ill patients:  140 - 180 mg/dL   Lab Results  Component Value Date   GLUCAP 172 (H) 11/01/2020   HGBA1C 12.3 (H) 10/31/2020    Review of Glycemic Control Results for ALONDRIA, MOUSSEAU (MRN [id number]) as of 11/01/2020 12:13  Ref. Range 11/01/2020 08:53 11/01/2020 09:54 11/01/2020 10:59 11/01/2020 11:51  Glucose-Capillary Latest Ref Range: 70 - 99 mg/dL 177 (H) 191 (H) 183 (H) 172 (H)   Diabetes history: DM 2 Outpatient Diabetes medications: Prednisone dose pack Current orders for Inpatient glycemic control:  Glargine-yfgn (Semglee) 26 units daily Novolog 0-15 units q 4 hours  Inpatient Diabetes Program Recommendations:    Agree with transition.  New diagnosis of DM.  Will follow and speak to patient when appropriate.  Will order LWWD booklet and insulin starter kit.  It appears patient will need insulin at d/c.    Thanks,  Adah Perl, RN, BC-ADM Inpatient Diabetes Coordinator Pager [phone]  (8a-5p)

## 2020-11-01 NOTE — Consult Note (Signed)
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  H/o Tampon in place Possible myositis in the Rt thigh, No gas, abscess, joint effusion or osteomyelitis in CT rt femur Generalised body pain  Bilateral Upper and Lower Extremity Swelling DKA  Recommendations  Continue Vancomycin, pharmacy to dose, clindamycin and zosyn has been appropriately dc'ed Will repeat 2 sets of blood cultures today ( ordered ) TTE. If negative for vegetations, will needa TEE Will need to closely follow rt thigh.  Will evaluate tomorrow again for metastatic sites of infection as she is tender everywhere on exam currently  Following   Rest of the management as per the primary team. Please call with questions or concerns.  Thank you for the consult  Chelsea Fraction, MD Infectious Disease Physician [facility] [address] [place], [place] [address] Phone: [phone]  Fax: [phone]  __________________________________________________________________________________________________________ HPI and Hospital Course: 30 Y O female with past medical history of DM who presented to the ED on 8/1 with generalized weakness, malaise fatigue and right groin pain.  Seen at the ED on 6/24 with left flank pain.  UA negative. CT  renal study negative.  Chest x-ray unremarkable.  Left flank pain thought to be MSK related.  Recommended to follow-up with with PCP for management of diabetes. Based on chart review, seen by urgent care on 7/2 for UTI and completed a course of nitrofurantoin for 5 days.  Seen again at urgent care on 7/17 and felt to have UTI with hematuria and a muscle spasm in the right hip.  She was given a Toradol injection and Augmentin for 5 days.  On 7/18 her vaginal panel came back positive for BV and she was given 7 days course of Flagyl which she completed.  Seen in ER on 7/24 with right thigh pain of unclear etiology with pain out of proportion to exam.  X-ray Rt femur negative, right lower extremity venous duplex negative she was given short course of pain medication and recommended to follow-up with sports medicine.   She returned to the ED on 8/1 with generalized pain, weakness, generalized swelling of extremities.  She also fell at home reportedly prior to arrival.  Of note she had a tampon in place, last placed on 7/31.  CT renal study on 6/24 also mentioned tampon in place.  She states that she has forgotten removing tampon in the past. She is still in her periods currently.   At ED, she was afebrile, WBC 18.9.  Labs remarkable for hyponatremia, hyperglycemia, AKI  HbA1c 12.3; positive beta-hydroxy butyrate Imaging findings as below  ROS: General- Denies fever, chills, loss of appetite and loss of weight HEENT - Denies headache, blurry vision,  sinus pain Chest -Chest pain when her HR goes up. Occasional SOB. Denies cough.  CVS- Denies any dizziness/lightheadedness, syncopal attacks, palpitations Abdomen- Denies any nausea, vomiting, abdominal pain, hematochezia and diarrhea Neuro - Denies any focal weakness, numbness, tingling sensation Psych - Denies any changes in mood irritability or depressive symptoms GU- Denies any burning, dysuria, hematuria or increased frequency of urination Skin - denies  any rashes/lesions MSK - Generalised body pain Chelsea Mathis/weakness   Past Medical History:  Diagnosis Date   Diabetes mellitus (HCC)    Gestational diabetes    Past Surgical History:  Procedure Laterality Date   LAPAROSCOPIC APPENDECTOMY N/A 11/11/2016   Procedure: APPENDECTOMY LAPAROSCOPIC;  Surgeon: Violeta Gelinashompson, Burke, MD;  [facility];  Service: General;  Laterality: N/A;     Scheduled Meds:  Chlorhexidine Gluconate Cloth  6 each Topical Daily   heparin  5,000 Units Subcutaneous Q8H   Continuous Infusions:  dextrose 5% lactated ringers 125 mL/hr at 11/01/20 0700   insulin 4.6 Units/hr (11/01/20 0700)   lactated ringers     piperacillin-tazobactam (ZOSYN)  IV 12.5 mL/hr at 11/01/20 0700   vancomycin     PRN Meds:.dextrose, docusate sodium, HYDROmorphone (DILAUDID) injection, polyethylene glycol  No Known Allergies  Social History   Socioeconomic History   Marital status: Single    Spouse name: Not on file   Number of children: Not on file   Years of education: Not on file   Highest education level: Not on file  Occupational History   Not on file  Tobacco Use   Smoking status: Never   Smokeless tobacco: Never  Substance and Sexual Activity   Alcohol use: No    Alcohol/week: 0.0 standard drinks   Drug use: No   Sexual activity: Yes  Other Topics Concern   Not on file  Social History Narrative   Not on file   Social Determinants of Health   Financial Resource Strain: Not on file  Food Insecurity: Not on file  Transportation Needs: Not on file  Physical Activity: Not on file  Stress: Not on file  Social Connections: Not on file  Intimate Partner Violence: Not on file   Family History  Problem Relation Age of Onset   Diabetes Mother    Hypertension Mother    Diabetes Maternal Uncle    Diabetes Brother    Stroke Maternal Grandmother    Leukemia Paternal Grandmother    Leukemia Paternal Grandfather      Vitals BP 107/67   Pulse (!) 135   Temp 99 F  (37.2 C) (Oral)   Resp (!) 32   Ht 5\' 4"  (1.626 m)   Wt 93.8 kg   LMP 10/31/2020   SpO2 92%   BMI 35.50 kg/m    Physical Exam Constitutional: Acutely sick looking lady sitting up in bed    Comments:   Cardiovascular:     Rate and Rhythm: Normal rate and regular rhythm.     Heart sounds:   Pulmonary:     Effort: Pulmonary effort is normal.     Comments:   Abdominal:     Palpations: Abdomen is soft.     Tenderness: Nontender and nondistended  Musculoskeletal:        General: Bilateral upper and lower extremities swollen, generalized tenderness and limited range of motion due to pain.  No obvious swelling or signs of septic arthritis on peripheral joints.  Skin:    Comments: No lesions or rashes  Neurological:     General: No focal deficit present.   Psychiatric:        Mood and Affect: Mood normal.  Calm and cooperative   Pertinent Microbiology Results for orders placed or performed during the hospital encounter of 10/31/20  Resp Panel by RT-PCR (Flu A&B, Covid) Nasopharyngeal Swab     Status: None   Collection Time: 10/31/20 10:53 AM   Specimen: Nasopharyngeal Swab; Nasopharyngeal(NP) swabs in vial transport medium  Result Value Ref Range Status   SARS Coronavirus 2 by RT PCR NEGATIVE NEGATIVE Final    Comment: (NOTE) SARS-CoV-2 target nucleic acids are NOT DETECTED.  The SARS-CoV-2 RNA is generally detectable in upper respiratory specimens during the acute phase of infection. The lowest concentration of SARS-CoV-2 viral copies this assay can detect is 138 copies/mL. A negative result does not preclude SARS-Cov-2 infection and should not be used as the sole basis for treatment or other patient management decisions. A negative result may occur with  improper specimen collection/handling, submission of specimen other than nasopharyngeal swab, presence of viral mutation(s) within the areas targeted by this assay, and inadequate number of viral copies(<138  copies/mL). A negative result must be combined with clinical observations, patient history, and epidemiological information. The expected result is Negative.  Fact Sheet for Patients:  BloggerCourse.com  Fact Sheet for Healthcare Providers:  SeriousBroker.it  This test is no t yet approved or cleared by the [place] FDA and  has been authorized for detection and/or diagnosis of SARS-CoV-2 by FDA under an Emergency Use Authorization (EUA). This EUA will remain  in effect (meaning this test can be used) for the duration of the COVID-19 declaration under Section 564(b)(1) of the Act, 21 U.S.C.section 360bbb-3(b)(1), unless the authorization is terminated  or revoked sooner.       Influenza A by PCR NEGATIVE NEGATIVE Final   Influenza B by PCR NEGATIVE NEGATIVE Final    Comment: (NOTE) The Xpert Xpress SARS-CoV-2/FLU/RSV plus assay is intended as an aid in the diagnosis of influenza from Nasopharyngeal swab specimens and should not be used as a sole basis for treatment. Nasal washings and aspirates are unacceptable for Xpert Xpress SARS-CoV-2/FLU/RSV testing.  Fact Sheet for Patients: BloggerCourse.com  Fact Sheet for Healthcare Providers: SeriousBroker.it  This test is not yet approved or cleared by the [place] FDA and has been authorized for detection and/or diagnosis of SARS-CoV-2 by FDA under an Emergency Use Authorization (EUA). This EUA will remain in effect (meaning this test can be used) for the duration of the COVID-19 declaration under Section 564(b)(1) of the Act, 21 U.S.C. section 360bbb-3(b)(1), unless the authorization is terminated or revoked.  Performed at [facility], [address], [place], [place] [address]   Blood culture (routine single)     Status: None (Preliminary result)   Collection Time: 10/31/20 11:00 AM   Specimen: BLOOD LEFT  FOREARM  Result Value Ref Range Status   Specimen Description BLOOD LEFT FOREARM  Final   Special Requests   Final    BOTTLES DRAWN AEROBIC AND ANAEROBIC Blood Culture adequate volume   Culture  Setup Time   Final    GRAM POSITIVE COCCI IN CLUSTERS IN BOTH AEROBIC AND ANAEROBIC BOTTLES Organism ID to follow CRITICAL RESULT CALLED TO, READ BACK BY AND VERIFIED WITH: J. LEDFORD PHARMD, AT 0221 11/01/20 D. VANHOOK Performed at  [facility], [address], [place], [place] [address]    Culture GRAM POSITIVE COCCI  Final   Report Status PENDING  Incomplete  Blood Culture ID Panel (Reflexed)     Status: Abnormal   Collection Time: 10/31/20 11:00 AM  Result Value Ref Range Status   Enterococcus faecalis NOT DETECTED NOT DETECTED Final   Enterococcus Faecium NOT DETECTED NOT DETECTED Final   Listeria monocytogenes NOT DETECTED NOT DETECTED Final   Staphylococcus species DETECTED (A) NOT DETECTED Final    Comment: CRITICAL RESULT CALLED TO, READ BACK BY AND VERIFIED WITH: J. LEDFORD PHARMD, AT 0221 11/01/20 D. VANHOOK    Staphylococcus aureus (BCID) DETECTED (A) NOT DETECTED Final    Comment: Methicillin (oxacillin)-resistant Staphylococcus aureus (MRSA). MRSA is predictably resistant to beta-lactam antibiotics (except ceftaroline). Preferred therapy is vancomycin unless clinically contraindicated. Patient requires contact precautions if  hospitalized. CRITICAL RESULT CALLED TO, READ BACK BY AND VERIFIED WITH: J. LEDFORD PHARMD, AT 0221 11/01/20 D. VANHOOK    Staphylococcus epidermidis NOT DETECTED NOT DETECTED Final   Staphylococcus lugdunensis NOT DETECTED NOT DETECTED Final   Streptococcus species NOT DETECTED NOT DETECTED Final   Streptococcus agalactiae NOT DETECTED NOT DETECTED Final   Streptococcus pneumoniae NOT DETECTED NOT DETECTED Final   Streptococcus pyogenes NOT DETECTED NOT DETECTED Final   A.calcoaceticus-baumannii NOT DETECTED NOT DETECTED Final   Bacteroides fragilis  NOT DETECTED NOT DETECTED Final   Enterobacterales NOT DETECTED NOT DETECTED Final   Enterobacter cloacae complex NOT DETECTED NOT DETECTED Final   Escherichia coli NOT DETECTED NOT DETECTED Final   Klebsiella aerogenes NOT DETECTED NOT DETECTED Final   Klebsiella oxytoca NOT DETECTED NOT DETECTED Final   Klebsiella pneumoniae NOT DETECTED NOT DETECTED Final   Proteus species NOT DETECTED NOT DETECTED Final   Salmonella species NOT DETECTED NOT DETECTED Final   Serratia marcescens NOT DETECTED NOT DETECTED Final   Haemophilus influenzae NOT DETECTED NOT DETECTED Final   Neisseria meningitidis NOT DETECTED NOT DETECTED Final   Pseudomonas aeruginosa NOT DETECTED NOT DETECTED Final   Stenotrophomonas maltophilia NOT DETECTED NOT DETECTED Final   Candida albicans NOT DETECTED NOT DETECTED Final   Candida auris NOT DETECTED NOT DETECTED Final   Candida glabrata NOT DETECTED NOT DETECTED Final   Candida krusei NOT DETECTED NOT DETECTED Final   Candida parapsilosis NOT DETECTED NOT DETECTED Final   Candida tropicalis NOT DETECTED NOT DETECTED Final   Cryptococcus neoformans/gattii NOT DETECTED NOT DETECTED Final   Meth resistant mecA/C and MREJ DETECTED (A) NOT DETECTED Final    Comment: CRITICAL RESULT CALLED TO, READ BACK BY AND VERIFIED WITH: J. LEDFORD PHARMD, AT 0221 11/01/20 Renato Shin Performed at [facility], [address], [place], [place] [address]   MRSA Next Gen by PCR, Nasal     Status: Abnormal   Collection Time: 10/31/20  7:50 PM   Specimen: Nasal Mucosa; Nasal Swab  Result Value Ref Range Status   MRSA by PCR Next Gen DETECTED (A) NOT DETECTED Final    Comment: RESULT CALLED TO, READ BACK BY AND VERIFIED WITH: WARNER,B RN AT 2144 10/31/2020 MITCHELL,L (NOTE) The GeneXpert MRSA Assay (FDA approved for NASAL specimens only), is one component of a comprehensive MRSA colonization surveillance program. It is not intended to diagnose MRSA infection nor to guide or monitor  treatment for MRSA infections. Test performance is not FDA approved in patients less than 79 years old. Performed at [facility], [address], [place],   [address]       Pertinent Lab seen by me: CBC Latest Ref Rng & Units 11/01/2020 10/31/2020 10/31/2020  WBC 4.0 - 10.5 K/uL 18.9(H) - -  Hemoglobin 12.0 - 15.0 g/dL 10.8(L) - 13.9  Hematocrit 36.0 - 46.0 % 30.3(L) - 41.0  Platelets 150 - 400 K/uL 102(L) 120(L) -   CMP Latest Ref Rng & Units 11/01/2020 10/31/2020 10/31/2020  Glucose 70 - 99 mg/dL 828(M) 034(J) 179(X)  BUN 6 - 20 mg/dL 20 50(V) 69(V)  Creatinine 0.44 - 1.00 mg/dL 9.48 0.16 5.53  Sodium 135 - 145 mmol/L 132(L) 131(L) 128(L)  Potassium 3.5 - 5.1 mmol/L 3.5 3.5 3.5  Chloride 98 - 111 mmol/L 98 99 95(L)  CO2 22 - 32 mmol/L 24 24 17(L)  Calcium 8.9 - 10.3 mg/dL 8.1(L) 7.8(L) 8.0(L)  Total Protein 6.5 - 8.1 g/dL - - -  Total Bilirubin 0.3 - 1.2 mg/dL - - -  Alkaline Phos 38 - 126 U/L - - -  AST 15 - 41 U/L - - -  ALT 0 - 44 U/L - - -     Pertinent Imagings/Other Imagings Plain films and CT images have been personally visualized and interpreted; radiology reports have been reviewed. Decision making incorporated into the Impression / Recommendations.    CT RT femur 10/31/20 IMPRESSION: 1. Low-level edema tracks along the superficial fascia of the thigh and also there is some mild infiltrative edema along deep fascia planes in the thigh. No drainable abscess, osteomyelitis, or substantial joint effusion. This could be a manifestation of inflammation or third spacing of fluid. No gas in the soft tissues is identified to indicate a aggressive necrotizing infection; careful surveillance in a low threshold for reimaging suggested.  CT chest 10/31/20 IMPRESSION: 1. Bilateral solid and cavitary nodules in the lungs. Appearance favors other septic emboli or atypical infectious process such as tuberculosis or fungal infection. Substantially less likely  are noninfective granulomatous process such as Wegener granulomatosis, or cavitary metastatic lesions.  CT abdomen/pelvis 10/31/20  ADDENDUM: Multifocal cavitary lesions within the right lower lobe, new since 09/23/2020. Consider dedicated CT chest for further evaluation.  I spent more than 70  minutes for this patient encounter including review of prior medical records/discussing diagnostics and treatment plan with the patient/family/coordinate care with primary/other specialits with greater than 50% of time in face to face encounter.   Electronically signed by:   Chelsea Fraction, MD Infectious Disease Physician [facility] Pager: [phone]

## 2020-11-01 NOTE — Progress Notes (Signed)
@   or around 2200 Pt stated she was in severe pain. Advised by On call MD that [facility] would need be called.   @2205  [facility] was called , Advised pt was in severe pain and in tears.   Pts mother & boyfriend called the unit upset stating that pt had called them, crying about her pain. Advised bf that I would call him back, & I was aware of pts pain and the MD was also notified.    @ or around 1125 [facility] was called again, receptionist advised me that MD was made aware and I was third in line for a returned called.

## 2020-11-01 NOTE — Progress Notes (Signed)
PHARMACY - PHYSICIAN COMMUNICATION CRITICAL VALUE ALERT - BLOOD CULTURE IDENTIFICATION (BCID)  Chelsea Mathis is an 29 y.o. female who presented to [facility] on 10/31/2020 with a chief complaint of weakness/fatigue.   Assessment:  WBC elevated, CRP elevated, bilateral solid and cavitary lung lesions  Name of physician (or Provider) Contacted: Dr Arsenio Loader  Current antibiotics: Vancomycin/Zosyn  Changes to prescribed antibiotics recommended:  No changes Can likely DC Zosyn later today  Results for orders placed or performed during the hospital encounter of 10/31/20  Blood Culture ID Panel (Reflexed) (Collected: 10/31/2020 11:00 AM)  Result Value Ref Range   Enterococcus faecalis NOT DETECTED NOT DETECTED   Enterococcus Faecium NOT DETECTED NOT DETECTED   Listeria monocytogenes NOT DETECTED NOT DETECTED   Staphylococcus species DETECTED (A) NOT DETECTED   Staphylococcus aureus (BCID) DETECTED (A) NOT DETECTED   Staphylococcus epidermidis NOT DETECTED NOT DETECTED   Staphylococcus lugdunensis NOT DETECTED NOT DETECTED   Streptococcus species NOT DETECTED NOT DETECTED   Streptococcus agalactiae NOT DETECTED NOT DETECTED   Streptococcus pneumoniae NOT DETECTED NOT DETECTED   Streptococcus pyogenes NOT DETECTED NOT DETECTED   A.calcoaceticus-baumannii NOT DETECTED NOT DETECTED   Bacteroides fragilis NOT DETECTED NOT DETECTED   Enterobacterales NOT DETECTED NOT DETECTED   Enterobacter cloacae complex NOT DETECTED NOT DETECTED   Escherichia coli NOT DETECTED NOT DETECTED   Klebsiella aerogenes NOT DETECTED NOT DETECTED   Klebsiella oxytoca NOT DETECTED NOT DETECTED   Klebsiella pneumoniae NOT DETECTED NOT DETECTED   Proteus species NOT DETECTED NOT DETECTED   Salmonella species NOT DETECTED NOT DETECTED   Serratia marcescens NOT DETECTED NOT DETECTED   Haemophilus influenzae NOT DETECTED NOT DETECTED   Neisseria meningitidis NOT DETECTED NOT DETECTED   Pseudomonas aeruginosa NOT DETECTED  NOT DETECTED   Stenotrophomonas maltophilia NOT DETECTED NOT DETECTED   Candida albicans NOT DETECTED NOT DETECTED   Candida auris NOT DETECTED NOT DETECTED   Candida glabrata NOT DETECTED NOT DETECTED   Candida krusei NOT DETECTED NOT DETECTED   Candida parapsilosis NOT DETECTED NOT DETECTED   Candida tropicalis NOT DETECTED NOT DETECTED   Cryptococcus neoformans/gattii NOT DETECTED NOT DETECTED   Meth resistant mecA/C and MREJ DETECTED (A) NOT DETECTED    Abran Duke, PharmD, BCPS Clinical Pharmacist Phone: [phone]

## 2020-11-01 NOTE — Progress Notes (Signed)
[facility] Physician-Brief Progress Note Patient Name: Chelsea Mathis DOB: [birth year] MRN: [id number]   Date of Service  11/01/2020  HPI/Events of Note  Patient c/o generalized body pain. Not relieved by Norco 5 mg / 325 mg.  eICU Interventions  Plan: Norco 5 mg / 325 mg PO  X 1 now (extra dose).     Intervention Category Major Interventions: Other:  Lenell Antu 11/01/2020, 11:52 PM

## 2020-11-01 NOTE — Progress Notes (Signed)
Reactive Afib Given rapid drop in plts going to hold off on full dose AC K/Mg have already been repleted Start amio If plts stable in AM and not cardioverted consider full dose AC  Myrla Halsted MD PCCM

## 2020-11-01 NOTE — Progress Notes (Signed)
NAMECareen Mathis, MRN:  [id number], DOB:  1990/08/07, LOS: 1 ADMISSION DATE:  10/31/2020, CONSULTATION DATE:  10/31/20 REFERRING MD:  ED, CHIEF COMPLAINT:  Pain    Brief History   30 y/o F who presented to [facility] on 8/1 with reports of elevated blood sugars, weakness and fatigue.  The patient returned to the ER on 8/1 with reports of elevated blood sugars, swelling to hands and lower extremities and generalized pain. She fell at home prior to arrival and reported generalized weakness.  Initial exam per EDP again demonstrated pain out of proportion to exam.  Soft tissue infection was a concern and a CT of the right femur and abdomen / pelvis assessed.  CT of the femur showed low-level edema tracking along the superficial fascia of the thigh and some mild infiltrative edema along dep fascia planes in the thigh. Dedicated CT of the abd/pelvis revealed multiple cavitary lesions within the RLL. Of note, it did mention a tampon in place (as did the CT renal study on 6/24).   Patient reports she is on her cycle, has forgotten and left them in place before, last put one in on 7/31.  Labs were notable for severe hyponatremia with Na 103, K 4.5, Cl 75, glucose 559, BUN 34 / Cr 1.01, AG corrected for albumin 25, alk phos 203, albumin 2.1, indirect bili 3.7, WBC 24.9, Hgb 12.5, platelets 175 and lactic acid 4.6.  She was treated with IVF and insulin gtt in the ER.  Pan cultures were obtained and empiric antibiotics were initiated.   Past Medical History  DMTII Appendectomy 2018  Significant Hospital Events   8/1: Admitted to ICU with hyponatremia, sepsis, and DKA 8/2: Transitioned off Endotool, +MRSA, D/C Zosyn, ID consult  Consults:  PCCM ID  Procedures:  None  Significant Diagnostic Tests:  CT CHEST WO CONTRAST  Result Date: 10/31/2020 CLINICAL DATA:  Chest pain and shortness of breath. EXAM: CT CHEST WITHOUT CONTRAST TECHNIQUE: Multidetector CT imaging of the chest was performed following the  standard protocol without IV contrast. COMPARISON:  10/31/2020 CT abdomen FINDINGS: Cardiovascular: Unremarkable Mediastinum/Nodes: Unremarkable Lungs/Pleura: Scattered cavitary lesions in both lungs are mostly peripheral in location but otherwise random. Some of the scattered pulmonary nodules are solid and not showing signs of cavitation. The cavitary lesions are variable wall thickness, some with relatively thin walls and some with thick walls. Index right middle lobe nodule which is partially cavitary measures 1.3 by 1.4 cm on image 68 series 4. Bilobed cavitary posterior basal segment right lower lobe nodule measures 1.5 by 1.2 cm on image 87 series 4 and has thick walls. Upper Abdomen: Unremarkable Musculoskeletal: Unremarkable IMPRESSION: 1. Bilateral solid and cavitary nodules in the lungs. Appearance favors other septic emboli or atypical infectious process such as tuberculosis or fungal infection. Substantially less likely are noninfective granulomatous process such as Wegener granulomatosis, or cavitary metastatic lesions. Electronically Signed   By: Van Clines M.D.   On: 10/31/2020 14:42   CT ABDOMEN PELVIS W CONTRAST  Addendum Date: 10/31/2020   ADDENDUM REPORT: 10/31/2020 12:21 ADDENDUM: Multifocal cavitary lesions within the right lower lobe, new since 09/23/2020. Consider dedicated CT chest for further evaluation. Findings conveyed to ordering clinician, Dr. Regan Lemming during the time of interpretation of the study. Electronically Signed   By: Michaelle Birks MD   On: 10/31/2020 12:21   Result Date: 10/31/2020 CLINICAL DATA:  30 year old female with right lower quadrant abdominal pain. EXAM: CT ABDOMEN AND PELVIS WITH CONTRAST TECHNIQUE:  Multidetector CT imaging of the abdomen and pelvis was performed using the standard protocol following bolus administration of intravenous contrast. CONTRAST:  123m OMNIPAQUE IOHEXOL 300 MG/ML  SOLN COMPARISON:  CT and pelvis, 09/03/2020 and 11/10/2016.  FINDINGS: The examination is mildly degraded secondary to motion artifact. Lower chest: Multifocal cavitary lesions within the right lower lobe, new since 09/23/2020 (see key image). Hepatobiliary: No focal liver abnormality is seen. No gallstones, gallbladder wall thickening, or biliary dilatation. Pancreas: Unremarkable. No pancreatic ductal dilatation or surrounding inflammatory changes. Spleen: Normal in size without focal abnormality. Adrenals/Urinary Tract: Adrenal glands are unremarkable. Moderate rotational right renal anomaly. Trace right renal collecting system distention. No discrete hydronephrosis. Moderate distention of urinary bladder. Stomach/Bowel: Stomach is within normal limits. Appendix appears normal. No evidence of bowel wall thickening, distention, or inflammatory changes. Vascular/Lymphatic: No significant vascular findings are present. No enlarged abdominal or pelvic lymph nodes. Other: IUD. Tampon. No abdominal wall hernia or abnormality. No abdominopelvic ascites. Musculoskeletal: No acute or significant osseous findings. IMPRESSION: No acute abdominopelvic process. Electronically Signed: By: JMichaelle BirksMD On: 10/31/2020 12:02   CT FEMUR RIGHT WO CONTRAST  Result Date: 10/31/2020 CLINICAL DATA:  Soft tissue infection of the thigh EXAM: CT OF THE LOWER RIGHT EXTREMITY WITHOUT CONTRAST TECHNIQUE: Multidetector CT imaging of the right lower extremity was performed according to the standard protocol. COMPARISON:  10/23/2020 radiographs FINDINGS: Bones/Joint/Cartilage No bony destructive findings characteristic the right femur. Of osteomyelitis involving no large hip joint effusion or substantial knee joint effusion identified. Ligaments Suboptimally assessed by CT. Muscles and Tendons Subtle infiltrative stranding along deep fascia planes without a well-defined drainable fluid collection. No gas tracks in the muscular tissues. Soft tissues Subcutaneous edema noted lateral to the right hip.  Low-grade infiltrative edema tracks along the superficial fascia margin of the thigh laterally. At the level of the knee this is more confluent and tracks down into the lateral and posterior calf region. There is a lesser degree of medial subcutaneous edema distally in the thigh although not substantially asymmetric from the contralateral medial thigh based on inclusion of the contralateral medial thigh. IMPRESSION: 1. Low-level edema tracks along the superficial fascia of the thigh and also there is some mild infiltrative edema along deep fascia planes in the thigh. No drainable abscess, osteomyelitis, or substantial joint effusion. This could be a manifestation of inflammation or third spacing of fluid. No gas in the soft tissues is identified to indicate a aggressive necrotizing infection; careful surveillance in a low threshold for reimaging suggested. Electronically Signed   By: WVan ClinesM.D.   On: 10/31/2020 14:34   DG Chest Port 1 View  Result Date: 10/31/2020 CLINICAL DATA:  Questionable sepsis EXAM: PORTABLE CHEST 1 VIEW COMPARISON:  None. FINDINGS: Heart is normal size. Patchy bilateral airspace opacities. No effusions or acute bony abnormality. IMPRESSION: Patchy bilateral airspace opacities concerning for pneumonia. Electronically Signed   By: KRolm BaptiseM.D.   On: 10/31/2020 10:53     Micro Data:  8/2 BC: MRSA  Antimicrobials:  Vancomycin 8/1>>> Zosyn 8/1>DC 8/2  Interim history/subjective:   Patient seen at bedside this AM. She states that she feels a little better compared to yesterday. She rates her pain a 8/10 compared to 10/10 pain on admission. She has continued complaints of full body myalgias.  Objective   Blood pressure 107/67, pulse (!) 135, temperature (!) 97.5 F (36.4 C), temperature source Oral, resp. rate (!) 32, height _0  (1.626 m), weight 93.8 kg, last  menstrual period 10/31/2020, SpO2 92 %, unknown if currently breastfeeding.        Intake/Output  Summary (Last 24 hours) at 11/01/2020 3567 Last data filed at 11/01/2020 0700 Gross per 24 hour  Intake 3952.4 ml  Output 450 ml  Net 3502.4 ml   Filed Weights   10/31/20 0940 10/31/20 2000 11/01/20 0500  Weight: 90.7 kg 93.3 kg 93.8 kg    Examination: Physical Exam Constitutional:      Appearance: She is ill-appearing.     Comments: Appears stated age, uncomfortable in appearance, answers questions appropriately. AAOx3  Cardiovascular:     Rate and Rhythm: Regular rhythm. Tachycardia present.     Pulses: Normal pulses.     Heart sounds: Murmur heard.  Pulmonary:     Effort: Pulmonary effort is normal.     Breath sounds: Normal breath sounds. No wheezing, rhonchi or rales.     Comments: Saturating well on 2L [facility], CTAB, mildly tachypneic with RR of 22 Abdominal:     General: Bowel sounds are normal.     Tenderness: There is no abdominal tenderness. There is no guarding.  Musculoskeletal:     Comments: Trace pitting edema in the lower extremities bilaterally with tenderness to palpation over the ankles.   Skin:    General: Skin is warm.     Findings: No erythema or rash.  Neurological:     Mental Status: She is alert and oriented to person, place, and time.     Resolved Hospital Problem list   DKA  Assessment & Plan:  Sepsis Cavitary Lung Lesions Diffuse Myalgias:  BC grew MRSA O/N, ID auto-consulted. LA down trending as well as Leukocytosis down trending to 18.9. Procal is 4.66. Continues to be tachycardic to the 130s. DIC Panel Negative, PT/INR mildly elevated 15.9/1.3. On 2L [place]. CT showed retained tampon, no purulence on pelvic exam by EDP. Does have Hx of retained tampons in the past. Unsure of source, patient does not use IVD, no findings on physical examination to suggest otherwise. While ESR and CRP are elevated, it is in the setting of sepsis, rheumatological workup still pending.  - DC Zosyn - Continue Vancomycin - Appreciate ID recommendations.  - F/U ANA, RF,  Aldolase, ANCA  Hyponatremia: (Resolved)  Resolved O/N 132. Patient is mentating well at bedside, fully conversant and AAOx3.   DKA: (Resolved) AG closed x2, Beta hydroxybutyrate 0.11, CBG 174. Transitioning off of Endotool today.  - Moderate SSI  - Glargine 26U - Trend CBG  TIIDM:  - CM diet - 26U of Glargine - Moderate SSI   Best practice:  Diet: CM DVT prophylaxis: SCDs Glucose control: 26U glargine and SSI Code Status: FULL Disposition: Transfer to [facility] on the progressive unit 11/02/20  Labs   CBC: Recent Labs  Lab 10/31/20 1041 10/31/20 1043 10/31/20 2056 11/01/20 0122  WBC 24.9*  --   --  18.9*  NEUTROABS 21.7*  --   --   --   HGB 12.5 13.9  --  10.8*  HCT 42.7 41.0  --  30.3*  MCV 88.6  --   --  74.1*  PLT 175  --  120* 102*    Basic Metabolic Panel: Recent Labs  Lab 10/31/20 1021 10/31/20 1043 10/31/20 1700 10/31/20 2056 11/01/20 0122  NA 103* 120* 128* 131* 132*  K 4.5 4.8 3.5 3.5 3.5  CL 75*  --  95* 99 98  CO2 8*  --  17* 24 24  GLUCOSE 559*  --  314* 206* 194*  BUN 34*  --  26* 22* 20  CREATININE 1.01*  --  0.93 0.59 0.57  CALCIUM 7.2*  --  8.0* 7.8* 8.1*   GFR: Estimated Creatinine Clearance: 112 mL/min (by C-G formula based on SCr of 0.57 mg/dL). Recent Labs  Lab 10/31/20 1041 10/31/20 1057 10/31/20 1507 10/31/20 1700 10/31/20 2056 11/01/20 0122  PROCALCITON  --   --   --  4.23  --  4.66  WBC 24.9*  --   --   --   --  18.9*  LATICACIDVEN  --  4.3* 4.6* 5.6* 4.2*  --     Liver Function Tests: Recent Labs  Lab 10/31/20 1041  AST 40  ALT 15  ALKPHOS 203*  BILITOT 3.7*  PROT <3.0*  ALBUMIN 2.1*   No results for input(s): LIPASE, AMYLASE in the last 168 hours. No results for input(s): AMMONIA in the last 168 hours.  ABG    Component Value Date/Time   HCO3 24.1 10/31/2020 2056   TCO2 12 (L) 10/31/2020 1043   ACIDBASEDEF 0.2 10/31/2020 2056   O2SAT 87.0 10/31/2020 2056     Coagulation Profile: Recent Labs  Lab  10/31/20 1109 10/31/20 2056  INR 1.4* 1.3*    Cardiac Enzymes: Recent Labs  Lab 10/31/20 1700  CKTOTAL 586*    HbA1C: Hgb A1c MFr Bld  Date/Time Value Ref Range Status  10/31/2020 10:21 AM 12.3 (H) 4.8 - 5.6 % Final    Comment:    (NOTE) Pre diabetes:          5.7%-6.4%  Diabetes:              >6.4%  Glycemic control for   <7.0% adults with diabetes     CBG: Recent Labs  Lab 11/01/20 0229 11/01/20 0330 11/01/20 0431 11/01/20 0531 11/01/20 0633  GLUCAP 190* 165* 178* 200* 174*    Review of Systems:   Negative with exception to above.  Past Medical History  She,  has a past medical history of Diabetes mellitus ([place]) and Gestational diabetes.   Surgical History    Past Surgical History:  Procedure Laterality Date   LAPAROSCOPIC APPENDECTOMY N/A 11/11/2016   Procedure: APPENDECTOMY LAPAROSCOPIC;  Surgeon: Georganna Skeans, MD;  Location: [place] [facility];  Service: General;  Laterality: N/A;     Social History   reports that she has never smoked. She has never used smokeless tobacco. She reports that she does not drink alcohol and does not use drugs.   Family History   Her family history includes Diabetes in her brother, maternal uncle, and mother; Hypertension in her mother; Leukemia in her paternal grandfather and paternal grandmother; Stroke in her maternal grandmother.   Allergies No Known Allergies   Home Medications  Prior to Admission medications   Medication Sig Start Date End Date Taking? Authorizing Provider  acetaminophen (TYLENOL) 500 MG tablet Take 1,000 mg by mouth every 6 (six) hours as needed for moderate pain or headache.    [provider]  HYDROcodone-acetaminophen (NORCO/VICODIN) 5-325 MG tablet Take 1 tablet by mouth every 6 (six) hours as needed. 10/23/20   Ripley Fraise, MD     Maudie Mercury, MD IMTS, PGY-3 Pager: [phone] 11/01/2020,9:12 AM

## 2020-11-01 NOTE — H&P (View-Only) (Signed)
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  H/o Tampon in place Possible myositis in the Rt thigh, No gas, abscess, joint effusion or osteomyelitis in CT rt femur Generalised body pain  Bilateral Upper and Lower Extremity Swelling DKA  Recommendations  Continue Vancomycin, pharmacy to dose, clindamycin and zosyn has been appropriately dc'ed Will repeat 2 sets of blood cultures today ( ordered ) TTE. If negative for vegetations, will needa TEE Will need to closely follow rt thigh.  Will evaluate tomorrow again for metastatic sites of infection as she is tender everywhere on exam currently  Following   Rest of the management as per the primary team. Please call with questions or concerns.  Thank you for the consult  Odette Fraction, MD Infectious Disease Physician [facility] [address] [place], [place] [address] Phone: [phone]  Fax: [phone]  __________________________________________________________________________________________________________ HPI and Hospital Course: 30 Y O female with past medical history of DM who presented to the ED on 8/1 with generalized weakness, malaise fatigue and right groin pain.  Seen at the ED on 6/24 with left flank pain.  UA negative. CT  renal study negative.  Chest x-ray unremarkable.  Left flank pain thought to be MSK related.  Recommended to follow-up with with PCP for management of diabetes. Based on chart review, seen by urgent care on 7/2 for UTI and completed a course of nitrofurantoin for 5 days.  Seen again at urgent care on 7/17 and felt to have UTI with hematuria and a muscle spasm in the right hip.  She was given a Toradol injection and Augmentin for 5 days.  On 7/18 her vaginal panel came back positive for BV and she was given 7 days course of Flagyl which she completed.  Seen in ER on 7/24 with right thigh pain of unclear etiology with pain out of proportion to exam.  X-ray Rt femur negative, right lower extremity venous duplex negative she was given short course of pain medication and recommended to follow-up with sports medicine.   She returned to the ED on 8/1 with generalized pain, weakness, generalized swelling of extremities.  She also fell at home reportedly prior to arrival.  Of note she had a tampon in place, last placed on 7/31.  CT renal study on 6/24 also mentioned tampon in place.  She states that she has forgotten removing tampon in the past. She is still in her periods currently.   At ED, she was afebrile, WBC 18.9.  Labs remarkable for hyponatremia, hyperglycemia, AKI  HbA1c 12.3; positive beta-hydroxy butyrate Imaging findings as below  ROS: General- Denies fever, chills, loss of appetite and loss of weight HEENT - Denies headache, blurry vision,  sinus pain Chest -Chest pain when her HR goes up. Occasional SOB. Denies cough.  CVS- Denies any dizziness/lightheadedness, syncopal attacks, palpitations Abdomen- Denies any nausea, vomiting, abdominal pain, hematochezia and diarrhea Neuro - Denies any focal weakness, numbness, tingling sensation Psych - Denies any changes in mood irritability or depressive symptoms GU- Denies any burning, dysuria, hematuria or increased frequency of urination Skin - denies  any rashes/lesions MSK - Generalised body pain [name]/weakness   Past Medical History:  Diagnosis Date   Diabetes mellitus (HCC)    Gestational diabetes    Past Surgical History:  Procedure Laterality Date   LAPAROSCOPIC APPENDECTOMY N/A 11/11/2016   Procedure: APPENDECTOMY LAPAROSCOPIC;  Surgeon: Violeta Gelinashompson, Burke, MD;  [facility];  Service: General;  Laterality: N/A;     Scheduled Meds:  Chlorhexidine Gluconate Cloth  6 each Topical Daily   heparin  5,000 Units Subcutaneous Q8H   Continuous Infusions:  dextrose 5% lactated ringers 125 mL/hr at 11/01/20 0700   insulin 4.6 Units/hr (11/01/20 0700)   lactated ringers     piperacillin-tazobactam (ZOSYN)  IV 12.5 mL/hr at 11/01/20 0700   vancomycin     PRN Meds:.dextrose, docusate sodium, HYDROmorphone (DILAUDID) injection, polyethylene glycol  No Known Allergies  Social History   Socioeconomic History   Marital status: Single    Spouse name: Not on file   Number of children: Not on file   Years of education: Not on file   Highest education level: Not on file  Occupational History   Not on file  Tobacco Use   Smoking status: Never   Smokeless tobacco: Never  Substance and Sexual Activity   Alcohol use: No    Alcohol/week: 0.0 standard drinks   Drug use: No   Sexual activity: Yes  Other Topics Concern   Not on file  Social History Narrative   Not on file   Social Determinants of Health   Financial Resource Strain: Not on file  Food Insecurity: Not on file  Transportation Needs: Not on file  Physical Activity: Not on file  Stress: Not on file  Social Connections: Not on file  Intimate Partner Violence: Not on file   Family History  Problem Relation Age of Onset   Diabetes Mother    Hypertension Mother    Diabetes Maternal Uncle    Diabetes Brother    Stroke Maternal Grandmother    Leukemia Paternal Grandmother    Leukemia Paternal Grandfather      Vitals BP 107/67   Pulse (!) 135   Temp 99 F  (37.2 C) (Oral)   Resp (!) 32   Ht 5\' 4"  (1.626 m)   Wt 93.8 kg   LMP 10/31/2020   SpO2 92%   BMI 35.50 kg/m    Physical Exam Constitutional: Acutely sick looking lady sitting up in bed    Comments:   Cardiovascular:     Rate and Rhythm: Normal rate and regular rhythm.     Heart sounds:   Pulmonary:     Effort: Pulmonary effort is normal.     Comments:   Abdominal:     Palpations: Abdomen is soft.     Tenderness: Nontender and nondistended  Musculoskeletal:        General: Bilateral upper and lower extremities swollen, generalized tenderness and limited range of motion due to pain.  No obvious swelling or signs of septic arthritis on peripheral joints.  Skin:    Comments: No lesions or rashes  Neurological:     General: No focal deficit present.   Psychiatric:        Mood and Affect: Mood normal.  Calm and cooperative   Pertinent Microbiology Results for orders placed or performed during the hospital encounter of 10/31/20  Resp Panel by RT-PCR (Flu A&B, Covid) Nasopharyngeal Swab     Status: None   Collection Time: 10/31/20 10:53 AM   Specimen: Nasopharyngeal Swab; Nasopharyngeal(NP) swabs in vial transport medium  Result Value Ref Range Status   SARS Coronavirus 2 by RT PCR NEGATIVE NEGATIVE Final    Comment: (NOTE) SARS-CoV-2 target nucleic acids are NOT DETECTED.  The SARS-CoV-2 RNA is generally detectable in upper respiratory specimens during the acute phase of infection. The lowest concentration of SARS-CoV-2 viral copies this assay can detect is 138 copies/mL. A negative result does not preclude SARS-Cov-2 infection and should not be used as the sole basis for treatment or other patient management decisions. A negative result may occur with  improper specimen collection/handling, submission of specimen other than nasopharyngeal swab, presence of viral mutation(s) within the areas targeted by this assay, and inadequate number of viral copies(<138  copies/mL). A negative result must be combined with clinical observations, patient history, and epidemiological information. The expected result is Negative.  Fact Sheet for Patients:  https://www.fda.gov/media/152166/download  Fact Sheet for Healthcare Providers:  https://www.fda.gov/media/152162/download  This test is no t yet approved or cleared by the [place] FDA and  has been authorized for detection and/or diagnosis of SARS-CoV-2 by FDA under an Emergency Use Authorization (EUA). This EUA will remain  in effect (meaning this test can be used) for the duration of the COVID-19 declaration under Section 564(b)(1) of the Act, 21 U.S.C.section 360bbb-3(b)(1), unless the authorization is terminated  or revoked sooner.       Influenza A by PCR NEGATIVE NEGATIVE Final   Influenza B by PCR NEGATIVE NEGATIVE Final    Comment: (NOTE) The Xpert Xpress SARS-CoV-2/FLU/RSV plus assay is intended as an aid in the diagnosis of influenza from Nasopharyngeal swab specimens and should not be used as a sole basis for treatment. Nasal washings and aspirates are unacceptable for Xpert Xpress SARS-CoV-2/FLU/RSV testing.  Fact Sheet for Patients: https://www.fda.gov/media/152166/download  Fact Sheet for Healthcare Providers: https://www.fda.gov/media/152162/download  This test is not yet approved or cleared by the [place] FDA and has been authorized for detection and/or diagnosis of SARS-CoV-2 by FDA under an Emergency Use Authorization (EUA). This EUA will remain in effect (meaning this test can be used) for the duration of the COVID-19 declaration under Section 564(b)(1) of the Act, 21 U.S.C. section 360bbb-3(b)(1), unless the authorization is terminated or revoked.  Performed at [facility], [address] [place], [place] [address]   Blood culture (routine single)     Status: None (Preliminary result)   Collection Time: 10/31/20 11:00 AM   Specimen: BLOOD LEFT  FOREARM  Result Value Ref Range Status   Specimen Description BLOOD LEFT FOREARM  Final   Special Requests   Final    BOTTLES DRAWN AEROBIC AND ANAEROBIC Blood Culture adequate volume   Culture  Setup Time   Final    GRAM POSITIVE COCCI IN CLUSTERS IN BOTH AEROBIC AND ANAEROBIC BOTTLES Organism ID to follow CRITICAL RESULT CALLED TO, READ BACK BY AND VERIFIED WITH: J. LEDFORD PHARMD, AT 0221 11/01/20 D. VANHOOK Performed at   [facility], [address], [place], [place] [address]    Culture GRAM POSITIVE COCCI  Final   Report Status PENDING  Incomplete  Blood Culture ID Panel (Reflexed)     Status: Abnormal   Collection Time: 10/31/20 11:00 AM  Result Value Ref Range Status   Enterococcus faecalis NOT DETECTED NOT DETECTED Final   Enterococcus Faecium NOT DETECTED NOT DETECTED Final   Listeria monocytogenes NOT DETECTED NOT DETECTED Final   Staphylococcus species DETECTED (A) NOT DETECTED Final    Comment: CRITICAL RESULT CALLED TO, READ BACK BY AND VERIFIED WITH: J. LEDFORD PHARMD, AT 0221 11/01/20 D. VANHOOK    Staphylococcus aureus (BCID) DETECTED (A) NOT DETECTED Final    Comment: Methicillin (oxacillin)-resistant Staphylococcus aureus (MRSA). MRSA is predictably resistant to beta-lactam antibiotics (except ceftaroline). Preferred therapy is vancomycin unless clinically contraindicated. Patient requires contact precautions if  hospitalized. CRITICAL RESULT CALLED TO, READ BACK BY AND VERIFIED WITH: J. LEDFORD PHARMD, AT 0221 11/01/20 D. VANHOOK    Staphylococcus epidermidis NOT DETECTED NOT DETECTED Final   Staphylococcus lugdunensis NOT DETECTED NOT DETECTED Final   Streptococcus species NOT DETECTED NOT DETECTED Final   Streptococcus agalactiae NOT DETECTED NOT DETECTED Final   Streptococcus pneumoniae NOT DETECTED NOT DETECTED Final   Streptococcus pyogenes NOT DETECTED NOT DETECTED Final   A.calcoaceticus-baumannii NOT DETECTED NOT DETECTED Final   Bacteroides fragilis  NOT DETECTED NOT DETECTED Final   Enterobacterales NOT DETECTED NOT DETECTED Final   Enterobacter cloacae complex NOT DETECTED NOT DETECTED Final   Escherichia coli NOT DETECTED NOT DETECTED Final   Klebsiella aerogenes NOT DETECTED NOT DETECTED Final   Klebsiella oxytoca NOT DETECTED NOT DETECTED Final   Klebsiella pneumoniae NOT DETECTED NOT DETECTED Final   Proteus species NOT DETECTED NOT DETECTED Final   Salmonella species NOT DETECTED NOT DETECTED Final   Serratia marcescens NOT DETECTED NOT DETECTED Final   Haemophilus influenzae NOT DETECTED NOT DETECTED Final   Neisseria meningitidis NOT DETECTED NOT DETECTED Final   Pseudomonas aeruginosa NOT DETECTED NOT DETECTED Final   Stenotrophomonas maltophilia NOT DETECTED NOT DETECTED Final   Candida albicans NOT DETECTED NOT DETECTED Final   Candida auris NOT DETECTED NOT DETECTED Final   Candida glabrata NOT DETECTED NOT DETECTED Final   Candida krusei NOT DETECTED NOT DETECTED Final   Candida parapsilosis NOT DETECTED NOT DETECTED Final   Candida tropicalis NOT DETECTED NOT DETECTED Final   Cryptococcus neoformans/gattii NOT DETECTED NOT DETECTED Final   Meth resistant mecA/C and MREJ DETECTED (A) NOT DETECTED Final    Comment: CRITICAL RESULT CALLED TO, READ BACK BY AND VERIFIED WITH: J. LEDFORD PHARMD, AT 0221 11/01/20 Renato Shin Performed at [facility], [address], [place], [place] [address]   MRSA Next Gen by PCR, Nasal     Status: Abnormal   Collection Time: 10/31/20  7:50 PM   Specimen: Nasal Mucosa; Nasal Swab  Result Value Ref Range Status   MRSA by PCR Next Gen DETECTED (A) NOT DETECTED Final    Comment: RESULT CALLED TO, READ BACK BY AND VERIFIED WITH: WARNER,B RN AT 2144 10/31/2020 MITCHELL,L (NOTE) The GeneXpert MRSA Assay (FDA approved for NASAL specimens only), is one component of a comprehensive MRSA colonization surveillance program. It is not intended to diagnose MRSA infection nor to guide or monitor  treatment for MRSA infections. Test performance is not FDA approved in patients less than 79 years old. Performed at [facility], [address], [place],   [address]       Pertinent Lab seen by me: CBC Latest Ref Rng & Units 11/01/2020 10/31/2020 10/31/2020  WBC 4.0 - 10.5 K/uL 18.9(H) - -  Hemoglobin 12.0 - 15.0 g/dL 10.8(L) - 13.9  Hematocrit 36.0 - 46.0 % 30.3(L) - 41.0  Platelets 150 - 400 K/uL 102(L) 120(L) -   CMP Latest Ref Rng & Units 11/01/2020 10/31/2020 10/31/2020  Glucose 70 - 99 mg/dL 828(M) 034(J) 179(X)  BUN 6 - 20 mg/dL 20 50(V) 69(V)  Creatinine 0.44 - 1.00 mg/dL 9.48 0.16 5.53  Sodium 135 - 145 mmol/L 132(L) 131(L) 128(L)  Potassium 3.5 - 5.1 mmol/L 3.5 3.5 3.5  Chloride 98 - 111 mmol/L 98 99 95(L)  CO2 22 - 32 mmol/L 24 24 17(L)  Calcium 8.9 - 10.3 mg/dL 8.1(L) 7.8(L) 8.0(L)  Total Protein 6.5 - 8.1 g/dL - - -  Total Bilirubin 0.3 - 1.2 mg/dL - - -  Alkaline Phos 38 - 126 U/L - - -  AST 15 - 41 U/L - - -  ALT 0 - 44 U/L - - -     Pertinent Imagings/Other Imagings Plain films and CT images have been personally visualized and interpreted; radiology reports have been reviewed. Decision making incorporated into the Impression / Recommendations.    CT RT femur 10/31/20 IMPRESSION: 1. Low-level edema tracks along the superficial fascia of the thigh and also there is some mild infiltrative edema along deep fascia planes in the thigh. No drainable abscess, osteomyelitis, or substantial joint effusion. This could be a manifestation of inflammation or third spacing of fluid. No gas in the soft tissues is identified to indicate a aggressive necrotizing infection; careful surveillance in a low threshold for reimaging suggested.  CT chest 10/31/20 IMPRESSION: 1. Bilateral solid and cavitary nodules in the lungs. Appearance favors other septic emboli or atypical infectious process such as tuberculosis or fungal infection. Substantially less likely  are noninfective granulomatous process such as Wegener granulomatosis, or cavitary metastatic lesions.  CT abdomen/pelvis 10/31/20  ADDENDUM: Multifocal cavitary lesions within the right lower lobe, new since 09/23/2020. Consider dedicated CT chest for further evaluation.  I spent more than 70  minutes for this patient encounter including review of prior medical records/discussing diagnostics and treatment plan with the patient/family/coordinate care with primary/other specialits with greater than 50% of time in face to face encounter.   Electronically signed by:   Odette Fraction, MD Infectious Disease Physician [facility] Pager: [phone]

## 2020-11-01 NOTE — Progress Notes (Signed)
Echocardiogram 2D Echocardiogram has been performed.  Chelsea Mathis 11/01/2020, 9:31 AM

## 2020-11-02 ENCOUNTER — Inpatient Hospital Stay (HOSPITAL_COMMUNITY): Payer: Medicaid Other

## 2020-11-02 DIAGNOSIS — A4101 Sepsis due to Methicillin susceptible Staphylococcus aureus: Secondary | ICD-10-CM

## 2020-11-02 DIAGNOSIS — A419 Sepsis, unspecified organism: Secondary | ICD-10-CM

## 2020-11-02 DIAGNOSIS — E119 Type 2 diabetes mellitus without complications: Secondary | ICD-10-CM

## 2020-11-02 DIAGNOSIS — I2690 Septic pulmonary embolism without acute cor pulmonale: Secondary | ICD-10-CM

## 2020-11-02 DIAGNOSIS — E11649 Type 2 diabetes mellitus with hypoglycemia without coma: Secondary | ICD-10-CM

## 2020-11-02 DIAGNOSIS — E1010 Type 1 diabetes mellitus with ketoacidosis without coma: Secondary | ICD-10-CM

## 2020-11-02 DIAGNOSIS — R7881 Bacteremia: Secondary | ICD-10-CM

## 2020-11-02 DIAGNOSIS — B9561 Methicillin susceptible Staphylococcus aureus infection as the cause of diseases classified elsewhere: Secondary | ICD-10-CM

## 2020-11-02 DIAGNOSIS — E871 Hypo-osmolality and hyponatremia: Secondary | ICD-10-CM

## 2020-11-02 DIAGNOSIS — E1110 Type 2 diabetes mellitus with ketoacidosis without coma: Secondary | ICD-10-CM | POA: Diagnosis not present

## 2020-11-02 LAB — BASIC METABOLIC PANEL
Anion gap: 9 (ref 5–15)
BUN: 19 mg/dL (ref 6–20)
CO2: 27 mmol/L (ref 22–32)
Calcium: 8.1 mg/dL — ABNORMAL LOW (ref 8.9–10.3)
Chloride: 91 mmol/L — ABNORMAL LOW (ref 98–111)
Creatinine, Ser: 0.57 mg/dL (ref 0.44–1.00)
GFR, Estimated: >60 mL/min (ref >60)
Glucose, Bld: 336 mg/dL — ABNORMAL HIGH (ref 70–99)
Potassium: 3.6 mmol/L (ref 3.5–5.1)
Sodium: 127 mmol/L — ABNORMAL LOW (ref 135–145)

## 2020-11-02 LAB — MAGNESIUM: Magnesium: 2.4 mg/dL (ref 1.7–2.4)

## 2020-11-02 LAB — RETICULOCYTES
Immature Retic Fract: 3.6 % (ref 2.3–15.9)
RBC.: 3.1 MIL/uL — ABNORMAL LOW (ref 3.87–5.11)
Retic Count, Absolute: 7.8 10*3/uL — ABNORMAL LOW (ref 19.0–186.0)
Retic Ct Pct: <0.4 % — ABNORMAL LOW (ref 0.4–3.1)

## 2020-11-02 LAB — CBC
HCT: 23 % — ABNORMAL LOW (ref 36.0–46.0)
HCT: 24.4 % — ABNORMAL LOW (ref 36.0–46.0)
Hemoglobin: 7.9 g/dL — ABNORMAL LOW (ref 12.0–15.0)
Hemoglobin: 8.3 g/dL — ABNORMAL LOW (ref 12.0–15.0)
MCH: 25.9 pg — ABNORMAL LOW (ref 26.0–34.0)
MCH: 25.5 pg — ABNORMAL LOW (ref 26.0–34.0)
MCHC: 34.3 g/dL (ref 30.0–36.0)
MCHC: 34 g/dL (ref 30.0–36.0)
MCV: 75.4 fL — ABNORMAL LOW (ref 80.0–100.0)
MCV: 75.1 fL — ABNORMAL LOW (ref 80.0–100.0)
Platelets: 53 10*3/uL — ABNORMAL LOW (ref 150–400)
Platelets: 56 10*3/uL — ABNORMAL LOW (ref 150–400)
RBC: 3.05 MIL/uL — ABNORMAL LOW (ref 3.87–5.11)
RBC: 3.25 MIL/uL — ABNORMAL LOW (ref 3.87–5.11)
RDW: 13 % (ref 11.5–15.5)
RDW: 12.9 % (ref 11.5–15.5)
WBC: 18.3 10*3/uL — ABNORMAL HIGH (ref 4.0–10.5)
WBC: 20.6 10*3/uL — ABNORMAL HIGH (ref 4.0–10.5)
nRBC: 0 % (ref 0.0–0.2)
nRBC: 0 % (ref 0.0–0.2)

## 2020-11-02 LAB — ANA W/REFLEX IF POSITIVE: Anti Nuclear Antibody (ANA): NEGATIVE

## 2020-11-02 LAB — GLUCOSE, CAPILLARY
Glucose-Capillary: 224 mg/dL — ABNORMAL HIGH (ref 70–99)
Glucose-Capillary: 246 mg/dL — ABNORMAL HIGH (ref 70–99)
Glucose-Capillary: 252 mg/dL — ABNORMAL HIGH (ref 70–99)
Glucose-Capillary: 323 mg/dL — ABNORMAL HIGH (ref 70–99)
Glucose-Capillary: 326 mg/dL — ABNORMAL HIGH (ref 70–99)
Glucose-Capillary: 367 mg/dL — ABNORMAL HIGH (ref 70–99)

## 2020-11-02 LAB — ANCA TITERS
Atypical P-ANCA titer: <1:20 {titer}
C-ANCA: <1:20 {titer}
P-ANCA: <1:20 {titer}

## 2020-11-02 LAB — PHOSPHORUS: Phosphorus: 1.7 mg/dL — ABNORMAL LOW (ref 2.5–4.6)

## 2020-11-02 LAB — CK: Total CK: 177 U/L (ref 38–234)

## 2020-11-02 LAB — PROCALCITONIN: Procalcitonin: 3.69 ng/mL

## 2020-11-02 LAB — C-REACTIVE PROTEIN: CRP: 32.7 mg/dL — ABNORMAL HIGH (ref <1.0)

## 2020-11-02 LAB — ALDOLASE: Aldolase: 30 U/L — ABNORMAL HIGH (ref 3.3–10.3)

## 2020-11-02 LAB — HEPATITIS C ANTIBODY: HCV Ab: NONREACTIVE

## 2020-11-02 LAB — RHEUMATOID FACTOR: Rheumatoid fact SerPl-aCnc: 14.3 IU/mL — ABNORMAL HIGH (ref <14.0)

## 2020-11-02 IMAGING — DX DG CHEST 1V PORT
1 series · 1 of 1 positions shown · non-contrast
Comparison: [DATE]

CLINICAL DATA: Hypoxia

EXAM:
PORTABLE CHEST 1 VIEW

[chest]
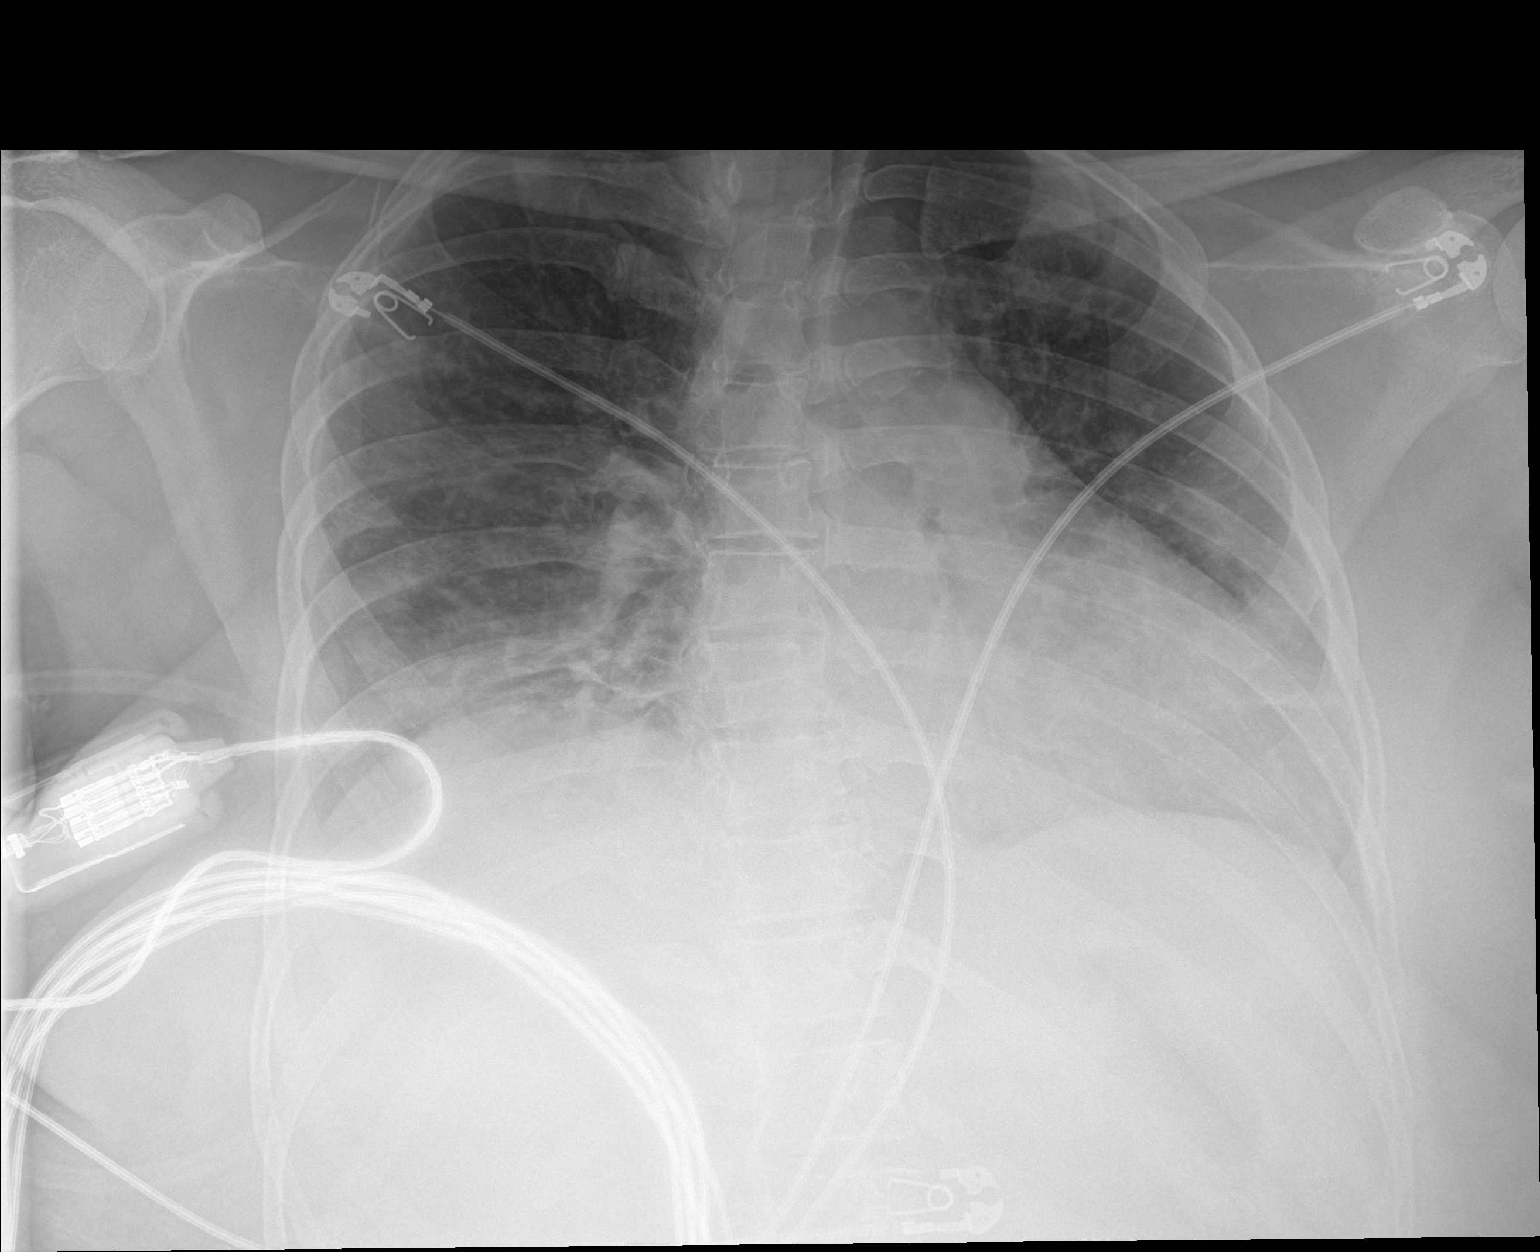

[1 of 1 positions shown; findings below may reference images not displayed]

FINDINGS: Borderline to mild cardiomegaly. Worsening airspace disease
bilaterally consistent with pneumonia. Possible small right
effusion. No pneumothorax
IMPRESSION: Worsening bilateral opacities concerning for pneumonia

## 2020-11-02 MED ORDER — INSULIN ASPART 100 UNIT/ML IJ SOLN
0.0000 [IU] | Freq: Three times a day (TID) | INTRAMUSCULAR | Status: DC
  Administered 2020-11-02: 5 [IU] via SUBCUTANEOUS
  Administered 2020-11-03: 8 [IU] via SUBCUTANEOUS
  Administered 2020-11-04 (×2): 11 [IU] via SUBCUTANEOUS
  Administered 2020-11-05: 3 [IU] via SUBCUTANEOUS
  Administered 2020-11-05: 2 [IU] via SUBCUTANEOUS
  Administered 2020-11-05: 3 [IU] via SUBCUTANEOUS
  Administered 2020-11-06: 2 [IU] via SUBCUTANEOUS
  Administered 2020-11-06: 3 [IU] via SUBCUTANEOUS
  Administered 2020-11-06: 2 [IU] via SUBCUTANEOUS
  Administered 2020-11-07 (×2): 3 [IU] via SUBCUTANEOUS
  Administered 2020-11-07: 5 [IU] via SUBCUTANEOUS
  Administered 2020-11-08: 2 [IU] via SUBCUTANEOUS
  Administered 2020-11-08: 3 [IU] via SUBCUTANEOUS
  Administered 2020-11-08 – 2020-11-11 (×5): 2 [IU] via SUBCUTANEOUS

## 2020-11-02 MED ORDER — VANCOMYCIN HCL 1.25 G IV SOLR
1250.0000 mg | Freq: Two times a day (BID) | INTRAVENOUS | Status: DC
  Administered 2020-11-02 (×2): 1250 mg via INTRAVENOUS
  Filled 2020-11-02 (×5): qty 1250

## 2020-11-02 MED ORDER — METOPROLOL TARTRATE 5 MG/5ML IV SOLN: 5.0000 mg | Freq: Once | INTRAVENOUS | Status: AC

## 2020-11-02 MED ORDER — METOPROLOL TARTRATE 5 MG/5ML IV SOLN
INTRAVENOUS | Status: AC
  Administered 2020-11-02: 5 mg via INTRAVENOUS
  Filled 2020-11-02: qty 5

## 2020-11-02 MED ORDER — MUPIROCIN 2 % EX OINT
TOPICAL_OINTMENT | Freq: Two times a day (BID) | CUTANEOUS | Status: DC
  Administered 2020-11-02: 1 via NASAL
  Filled 2020-11-02 (×4): qty 22

## 2020-11-02 MED ORDER — MAGNESIUM SULFATE 2 GM/50ML IV SOLN: 2.0000 g | Freq: Once | INTRAVENOUS | Status: DC

## 2020-11-02 MED ORDER — OXYCODONE-ACETAMINOPHEN 5-325 MG PO TABS
1.0000 | ORAL_TABLET | ORAL | Status: DC | PRN
  Administered 2020-11-02: 1 via ORAL
  Administered 2020-11-02: 2 via ORAL
  Administered 2020-11-02: 1 via ORAL
  Administered 2020-11-02: 2 via ORAL
  Administered 2020-11-03 (×2): 1 via ORAL
  Administered 2020-11-03: 2 via ORAL
  Administered 2020-11-03: 1 via ORAL
  Administered 2020-11-04: 2 via ORAL
  Administered 2020-11-04: 1 via ORAL
  Administered 2020-11-04 – 2020-11-11 (×31): 2 via ORAL
  Filled 2020-11-02 (×2): qty 1
  Filled 2020-11-02 (×5): qty 2
  Filled 2020-11-02: qty 1
  Filled 2020-11-02 (×8): qty 2
  Filled 2020-11-02: qty 1
  Filled 2020-11-02 (×9): qty 2
  Filled 2020-11-02: qty 1
  Filled 2020-11-02 (×3): qty 2
  Filled 2020-11-02: qty 1
  Filled 2020-11-02 (×10): qty 2

## 2020-11-02 MED ORDER — INSULIN ASPART 100 UNIT/ML IJ SOLN
10.0000 [IU] | Freq: Three times a day (TID) | INTRAMUSCULAR | Status: DC
  Administered 2020-11-02 – 2020-11-04 (×5): 10 [IU] via SUBCUTANEOUS

## 2020-11-02 MED ORDER — POTASSIUM & SODIUM PHOSPHATES 280-160-250 MG PO PACK
1.0000 | PACK | Freq: Three times a day (TID) | ORAL | Status: AC
  Administered 2020-11-02 (×3): 1 via ORAL
  Filled 2020-11-02 (×4): qty 1

## 2020-11-02 MED ORDER — SODIUM CHLORIDE 0.9 % IV SOLN: INTRAVENOUS | Status: DC

## 2020-11-02 MED ORDER — ACETAMINOPHEN 500 MG PO TABS
1000.0000 mg | ORAL_TABLET | Freq: Once | ORAL | Status: AC
  Administered 2020-11-02: 1000 mg via ORAL
  Filled 2020-11-02: qty 2

## 2020-11-02 MED ORDER — INSULIN GLARGINE-YFGN 100 UNIT/ML ~~LOC~~ SOLN
35.0000 [IU] | Freq: Every day | SUBCUTANEOUS | Status: DC
  Administered 2020-11-03 – 2020-11-10 (×8): 35 [IU] via SUBCUTANEOUS
  Filled 2020-11-02 (×8): qty 0.35

## 2020-11-02 NOTE — Progress Notes (Addendum)
Pharmacy Antibiotic Note  Chelsea Mathis is a 30 y.o. 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 Pharmacy has been consulted for vancomycin. Pt temp elevated at 100.9 and WBC increasing to 26.0. Scr stable at 0.54.   On 8/3, vancomycin dose increased to 1250mg  Q12H. Patient did not receive AM dose on 8/4, so only a trough level was drawn on 8/5. Level returned at 11, thus patient is not at a toxic level.   Plan: Continue vancomycin 1250mg  IV Q12H F/u renal fxn and peak/trough at SS.   Height: 5\' 4"  (162.6 cm) Weight: 98 kg (216 lb 0.8 oz) IBW/kg (Calculated) : 54.7  Temp (24hrs), Avg:98.2 F (36.8 C), Min:97.7 F (36.5 C), Max:98.5 F (36.9 C)  Recent Labs  Lab 10/31/20 1021 10/31/20 1041 10/31/20 1057 10/31/20 1507 10/31/20 1700 10/31/20 2056 11/01/20 0122 11/02/20 0248  WBC  --  24.9*  --   --   --   --  18.9* 18.3*  CREATININE 1.01*  --   --   --  0.93 0.59 0.57 0.57  LATICACIDVEN  --   --  4.3* 4.6* 5.6* 4.2*  --   --      Estimated Creatinine Clearance: 114.8 mL/min (by C-G formula based on SCr of 0.57 mg/dL).    No Known Allergies  Antimicrobials this admission: Vanc 8/01 >> Zosyn 8/01 > 8/02 Clinda x 1 8/01  Microbiology results: 8/02 blood: NGTD 8/01 urine cx: contaminated sample, needs redo 8/01 blood 2/2: MRSA 8/01 Resp panel: neg   10/01, PharmD Pharmacy Resident 11/04/2020, 8:08 AM

## 2020-11-02 NOTE — Progress Notes (Signed)
MD Nettey made aware. MD in room with Patient.

## 2020-11-02 NOTE — Plan of Care (Signed)
  Problem: Education: Goal: Ability to describe self-care measures that may prevent or decrease complications (Diabetes Survival Skills Education) will improve Outcome: Progressing Goal: Individualized Educational Video(s) Outcome: Progressing   

## 2020-11-02 NOTE — Progress Notes (Signed)
    [facility] has been requested to perform a transesophageal echocardiogram on Chelsea Mathis for bacteremia.  After careful review of history and examination, the risks and benefits of transesophageal echocardiogram have been explained including risks of esophageal damage, perforation (1:10,000 risk), bleeding, pharyngeal hematoma as well as other potential complications associated with conscious sedation including aspiration, arrhythmia, respiratory failure and death. Alternatives to treatment were discussed, questions were answered. Patient is willing to proceed.   Pt is scheduled 11/03/20 for TEE with Dr. Cristal Deer. NPO at [place] please.  Roe Rutherford Gabriana Wilmott, [place]  11/02/2020 4:10 PM

## 2020-11-02 NOTE — Progress Notes (Addendum)
PROGRESS NOTE    Chelsea Mathis  YWV:[id number] DOB: [birth year] DOA: 10/31/2020 PCP: Pcp, No   Brief Narrative: Chelsea Mathis is a 30 y.o. female with a history of diabetes mellitus type 2.  Patient presented secondary to elevated blood sugar, weakness, fatigue with evidence of sepsis.  She started on empiric biotics with blood cultures significant for Staph aureus.  She is also found to have a septic pulmonary emboli on CT imaging concerning for possible endocarditis.   Assessment & Plan:   Principal Problem:   Severe sepsis ([place]) Active Problems:   DKA (diabetic ketoacidosis) ([place])   Bacteremia due to Staphylococcus aureus   Septic pulmonary embolism ([facility])   Type 2 diabetes mellitus with hypoglycemia without coma ([place])   Severe sepsis Present on admission. Patient admitted to ICU, but no need for vasopressor support. Cavitary lung lesions noted on CTA chest. Empirically started on Vancomycin/Clindamycin/Zosyn. Blood culture significant for staphylococcus aureus infection and transitioned to Vancomycin IV monotherapy.  Staphylococcus bacteremia Cavitary lung lesions Infectious disease consulted. Empiric antibiotics as above and now transitioned to Vancomycin IV monotherapy. -ID recommendations: Vancomycin, Transesophageal Echocardiogram (cardiology consulted for exam on 8/3)  DKA In setting of infection and recent prednisone use. Patient presented with elevated glucose, anion gap beta-hydroxybutyric acid and low bicarbonate level. Patient started on insulin drip with rapid improvement in anion gap, CO2 and blood sugars. Transitioned off of insulin drip and to Lantus. Resolved.  Diabetes mellitus, type 2 History of diabetes; recently started on steroids. Patient's hemoglobin A1C is 12.3%. -Increase to Semglee 35 units daily and start Novolog 10 units TID with meals. Switch to TID with meals -Continue carb modified diet -Diabetes coordinator for education  Hyponatremia Severe.  Sodium of 103 on admission, although repeat sodium 20 min later of 120. Rapid increase with control of blood sugar. Most likely mainly secondary to pseudohyponatremia from hyperglycemia.  AKI In setting of DKA and likely accompanying dehydration. Resolved with IV fluids.  Atrial fibrillation Appears to be transient. Started on Amiodarone with bolus and drip. Currently in sinus rhythm. -Discontinue amiodarone  Myalgias Unclear etiology but likely related to overall infection. CK elevated on admission but repeat is normal. Initial imaging without any specific findings. -Percocet prn -ID ordered MRI left thigh; I will add right thigh MRI in setting of previous CT with non-specific findings   DVT prophylaxis: SCDs Code Status:   Code Status: Full Code Family Communication: Fiance at bedside Disposition Plan: Discharge likely home in 3-5 days pending continued workup/management of bacteremia, improvement of sepsis, myalgias   Consultants:  PCCM  Procedures:  TRANSTHORACIC ECHOCARDIOGRAM (11/01/2020) IMPRESSIONS     1. Left ventricular ejection fraction, by estimation, is 70 to 75%. The  left ventricle has hyperdynamic function. The left ventricle has no  regional wall motion abnormalities. Indeterminate diastolic filling due to  E-A fusion.   2. Right ventricular systolic function is normal. The right ventricular  size is normal.   3. The mitral valve is normal in structure. No evidence of mitral valve  regurgitation. No evidence of mitral stenosis.   4. The aortic valve is normal in structure. Aortic valve regurgitation is  not visualized. No aortic stenosis is present.   5. The inferior vena cava is normal in size with greater than 50%  respiratory variability, suggesting right atrial pressure of 3 mmHg.   Conclusion(s)/Recommendation(s): No evidence of valvular vegetations on  this transthoracic echocardiogram. Would recommend a transesophageal  echocardiogram to exclude  infective endocarditis if clinically indicated  Antimicrobials: Vancomycin IV Zosyn IV Clindamycin IV    Subjective: Continued thigh, leg, ankle pain. Also has bilateral arm pain.   Objective: Vitals:   11/01/20 1933 11/02/20 0009 11/02/20 0332 11/02/20 0418  BP: 114/68 132/75 96/64   Pulse: (!) 116 (!) 113 (!) 109   Resp: (!) 25 (!) 25 (!) 21   Temp: 98.1 F (36.7 C) 98.3 F (36.8 C) 98.3 F (36.8 C)   TempSrc: Oral Oral Oral   SpO2: 95% 99% 99%   Weight:    98 kg  Height:        Intake/Output Summary (Last 24 hours) at 11/02/2020 0603 Last data filed at 11/01/2020 1200 Gross per 24 hour  Intake 736.56 ml  Output --  Net 736.56 ml   Filed Weights   10/31/20 2000 11/01/20 0500 11/02/20 0418  Weight: 93.3 kg 93.8 kg 98 kg    Examination:  General exam: Appears calm and comfortable Respiratory system: Clear to auscultation. Respiratory effort normal. Cardiovascular system: S1 & S2 heard, tachycardia, normal rhythm. No murmurs, rubs, gallops or clicks. Gastrointestinal system: Abdomen is nondistended, soft and nontender. No organomegaly or masses felt. Normal bowel sounds heard. Central nervous system: Alert and oriented. No focal neurological deficits. Musculoskeletal: Some trace-1+ BLE edema. Diffuse arm/leg tenderness Skin: No cyanosis. No rashes Psychiatry: Judgement and insight appear normal. Mood & affect appropriate.     Data Reviewed: I have personally reviewed following labs and imaging studies  CBC Lab Results  Component Value Date   WBC 18.3 (H) 11/02/2020   RBC 3.05 (L) 11/02/2020   HGB 7.9 (L) 11/02/2020   HCT 23.0 (L) 11/02/2020   MCV 75.4 (L) 11/02/2020   MCH 25.9 (L) 11/02/2020   PLT 53 (L) 11/02/2020   MCHC 34.3 11/02/2020   RDW 13.0 11/02/2020   LYMPHSABS 1.7 10/31/2020   MONOABS 0.0 (L) 10/31/2020   EOSABS 0.0 10/31/2020   BASOSABS 0.0 90/21/1155     Last metabolic panel Lab Results  Component Value Date   NA 127 (L) 11/02/2020    K 3.6 11/02/2020   CL 91 (L) 11/02/2020   CO2 27 11/02/2020   BUN 19 11/02/2020   CREATININE 0.57 11/02/2020   GLUCOSE 336 (H) 11/02/2020   GFRNONAA >60 11/02/2020   GFRAA >60 11/10/2016   CALCIUM 8.1 (L) 11/02/2020   PHOS 1.7 (L) 11/02/2020   PROT 5.4 (L) 11/01/2020   ALBUMIN 1.5 (L) 11/01/2020   BILITOT 0.9 11/01/2020   ALKPHOS 106 11/01/2020   AST 55 (H) 11/01/2020   ALT 21 11/01/2020   ANIONGAP 9 11/02/2020    CBG (last 3)  Recent Labs    11/01/20 2014 11/02/20 0007 11/02/20 0331  GLUCAP 384* 367* 326*     GFR: Estimated Creatinine Clearance: 114.8 mL/min (by C-G formula based on SCr of 0.57 mg/dL).  Coagulation Profile: Recent Labs  Lab 10/31/20 1109 10/31/20 2056  INR 1.4* 1.3*    Recent Results (from the past 240 hour(s))  Resp Panel by RT-PCR (Flu A&B, Covid) Nasopharyngeal Swab     Status: None   Collection Time: 10/31/20 10:53 AM   Specimen: Nasopharyngeal Swab; Nasopharyngeal(NP) swabs in vial transport medium  Result Value Ref Range Status   SARS Coronavirus 2 by RT PCR NEGATIVE NEGATIVE Final    Comment: (NOTE) SARS-CoV-2 target nucleic acids are NOT DETECTED.  The SARS-CoV-2 RNA is generally detectable in upper respiratory specimens during the acute phase of infection. The lowest concentration of SARS-CoV-2 viral copies this assay can  detect is 138 copies/mL. A negative result does not preclude SARS-Cov-2 infection and should not be used as the sole basis for treatment or other patient management decisions. A negative result may occur with  improper specimen collection/handling, submission of specimen other than nasopharyngeal swab, presence of viral mutation(s) within the areas targeted by this assay, and inadequate number of viral copies(<138 copies/mL). A negative result must be combined with clinical observations, patient history, and epidemiological information. The expected result is Negative.  Fact Sheet for Patients:   EntrepreneurPulse.com.au  Fact Sheet for Healthcare Providers:  IncredibleEmployment.be  This test is no t yet approved or cleared by the [place] FDA and  has been authorized for detection and/or diagnosis of SARS-CoV-2 by FDA under an Emergency Use Authorization (EUA). This EUA will remain  in effect (meaning this test can be used) for the duration of the COVID-19 declaration under Section 564(b)(1) of the Act, 21 U.S.C.section 360bbb-3(b)(1), unless the authorization is terminated  or revoked sooner.       Influenza A by PCR NEGATIVE NEGATIVE Final   Influenza B by PCR NEGATIVE NEGATIVE Final    Comment: (NOTE) The Xpert Xpress SARS-CoV-2/FLU/RSV plus assay is intended as an aid in the diagnosis of influenza from Nasopharyngeal swab specimens and should not be used as a sole basis for treatment. Nasal washings and aspirates are unacceptable for Xpert Xpress SARS-CoV-2/FLU/RSV testing.  Fact Sheet for Patients: EntrepreneurPulse.com.au  Fact Sheet for Healthcare Providers: IncredibleEmployment.be  This test is not yet approved or cleared by the [place] FDA and has been authorized for detection and/or diagnosis of SARS-CoV-2 by FDA under an Emergency Use Authorization (EUA). This EUA will remain in effect (meaning this test can be used) for the duration of the COVID-19 declaration under Section 564(b)(1) of the Act, 21 U.S.C. section 360bbb-3(b)(1), unless the authorization is terminated or revoked.  Performed at [facility], [place] [address] [place],  [address]   Blood culture (routine single)     Status: Abnormal (Preliminary result)   Collection Time: 10/31/20 11:00 AM   Specimen: BLOOD LEFT FOREARM  Result Value Ref Range Status   Specimen Description BLOOD LEFT FOREARM  Final   Special Requests   Final    BOTTLES DRAWN AEROBIC AND ANAEROBIC Blood Culture adequate volume    Culture  Setup Time   Final    GRAM POSITIVE COCCI IN CLUSTERS IN BOTH AEROBIC AND ANAEROBIC BOTTLES Organism ID to follow CRITICAL RESULT CALLED TO, READ BACK BY AND VERIFIED WITH: J. LEDFORD PHARMD, AT 0221 11/01/20 D. VANHOOK    Culture (A)  Final    STAPHYLOCOCCUS AUREUS CULTURE REINCUBATED FOR BETTER GROWTH Performed at [facility], [place] [address] [place], [place] [address]    Report Status PENDING  Incomplete  Blood Culture ID Panel (Reflexed)     Status: Abnormal   Collection Time: 10/31/20 11:00 AM  Result Value Ref Range Status   Enterococcus faecalis NOT DETECTED NOT DETECTED Final   Enterococcus Faecium NOT DETECTED NOT DETECTED Final   Listeria monocytogenes NOT DETECTED NOT DETECTED Final   Staphylococcus species DETECTED (A) NOT DETECTED Final    Comment: CRITICAL RESULT CALLED TO, READ BACK BY AND VERIFIED WITH: J. LEDFORD PHARMD, AT 0221 11/01/20 D. VANHOOK    Staphylococcus aureus (BCID) DETECTED (A) NOT DETECTED Final    Comment: Methicillin (oxacillin)-resistant Staphylococcus aureus (MRSA). MRSA is predictably resistant to beta-lactam antibiotics (except ceftaroline). Preferred therapy is vancomycin unless clinically contraindicated. Patient requires  contact precautions if  hospitalized. CRITICAL RESULT CALLED TO, READ BACK BY AND VERIFIED WITH: J. LEDFORD PHARMD, AT 0221 11/01/20 D. VANHOOK    Staphylococcus epidermidis NOT DETECTED NOT DETECTED Final   Staphylococcus lugdunensis NOT DETECTED NOT DETECTED Final   Streptococcus species NOT DETECTED NOT DETECTED Final   Streptococcus agalactiae NOT DETECTED NOT DETECTED Final   Streptococcus pneumoniae NOT DETECTED NOT DETECTED Final   Streptococcus pyogenes NOT DETECTED NOT DETECTED Final   A.calcoaceticus-baumannii NOT DETECTED NOT DETECTED Final   Bacteroides fragilis NOT DETECTED NOT DETECTED Final   Enterobacterales NOT DETECTED NOT DETECTED Final   Enterobacter cloacae complex NOT DETECTED NOT  DETECTED Final   Escherichia coli NOT DETECTED NOT DETECTED Final   Klebsiella aerogenes NOT DETECTED NOT DETECTED Final   Klebsiella oxytoca NOT DETECTED NOT DETECTED Final   Klebsiella pneumoniae NOT DETECTED NOT DETECTED Final   Proteus species NOT DETECTED NOT DETECTED Final   Salmonella species NOT DETECTED NOT DETECTED Final   Serratia marcescens NOT DETECTED NOT DETECTED Final   Haemophilus influenzae NOT DETECTED NOT DETECTED Final   Neisseria meningitidis NOT DETECTED NOT DETECTED Final   Pseudomonas aeruginosa NOT DETECTED NOT DETECTED Final   Stenotrophomonas maltophilia NOT DETECTED NOT DETECTED Final   Candida albicans NOT DETECTED NOT DETECTED Final   Candida auris NOT DETECTED NOT DETECTED Final   Candida glabrata NOT DETECTED NOT DETECTED Final   Candida krusei NOT DETECTED NOT DETECTED Final   Candida parapsilosis NOT DETECTED NOT DETECTED Final   Candida tropicalis NOT DETECTED NOT DETECTED Final   Cryptococcus neoformans/gattii NOT DETECTED NOT DETECTED Final   Meth resistant mecA/C and MREJ DETECTED (A) NOT DETECTED Final    Comment: CRITICAL RESULT CALLED TO, READ BACK BY AND VERIFIED WITH: J. LEDFORD PHARMD, AT 0221 11/01/20 [place] Performed at [facility], [address], [place], [place] [address]   Urine Culture     Status: Abnormal   Collection Time: 10/31/20  1:29 PM   Specimen: In/Out Cath Urine  Result Value Ref Range Status   Specimen Description IN/OUT CATH URINE  Final   Special Requests   Final    NONE Performed at [facility], [place] [address] [place], [place] [address]    Culture MULTIPLE SPECIES PRESENT, SUGGEST RECOLLECTION (A)  Final   Report Status 11/01/2020 FINAL  Final  MRSA Next Gen by PCR, Nasal     Status: Abnormal   Collection Time: 10/31/20  7:50 PM   Specimen: Nasal Mucosa; Nasal Swab  Result Value Ref Range Status   MRSA by PCR Next Gen DETECTED (A) NOT DETECTED Final    Comment: RESULT CALLED TO, READ BACK BY AND  VERIFIED WITH: WARNER,B RN AT 2144 10/31/2020 MITCHELL,L (NOTE) The GeneXpert MRSA Assay (FDA approved for NASAL specimens only), is one component of a comprehensive MRSA colonization surveillance program. It is not intended to diagnose MRSA infection nor to guide or monitor treatment for MRSA infections. Test performance is not FDA approved in patients less than 72 years old. Performed at [facility], [place] [address], [place], [place] [address]         Radiology Studies: CT CHEST WO CONTRAST  Result Date: 10/31/2020 CLINICAL DATA:  Chest pain and shortness of breath. EXAM: CT CHEST WITHOUT CONTRAST TECHNIQUE: Multidetector CT imaging of the chest was performed following the standard protocol without IV contrast. COMPARISON:  10/31/2020 CT abdomen FINDINGS: Cardiovascular: Unremarkable Mediastinum/Nodes: Unremarkable Lungs/Pleura: Scattered cavitary lesions in both  lungs are mostly peripheral in location but otherwise random. Some of the scattered pulmonary nodules are solid and not showing signs of cavitation. The cavitary lesions are variable wall thickness, some with relatively thin walls and some with thick walls. Index right middle lobe nodule which is partially cavitary measures 1.3 by 1.4 cm on image 68 series 4. Bilobed cavitary posterior basal segment right lower lobe nodule measures 1.5 by 1.2 cm on image 87 series 4 and has thick walls. Upper Abdomen: Unremarkable Musculoskeletal: Unremarkable IMPRESSION: 1. Bilateral solid and cavitary nodules in the lungs. Appearance favors other septic emboli or atypical infectious process such as tuberculosis or fungal infection. Substantially less likely are noninfective granulomatous process such as Wegener granulomatosis, or cavitary metastatic lesions. Electronically Signed   By: Van Clines M.D.   On: 10/31/2020 14:42   CT ABDOMEN PELVIS W CONTRAST  Addendum Date: 10/31/2020   ADDENDUM REPORT: 10/31/2020 12:21 ADDENDUM: Multifocal  cavitary lesions within the right lower lobe, new since 09/23/2020. Consider dedicated CT chest for further evaluation. Findings conveyed to ordering clinician, Dr. Regan Lemming during the time of interpretation of the study. Electronically Signed   By: Michaelle Birks MD   On: 10/31/2020 12:21   Result Date: 10/31/2020 CLINICAL DATA:  30 year old female with right lower quadrant abdominal pain. EXAM: CT ABDOMEN AND PELVIS WITH CONTRAST TECHNIQUE: Multidetector CT imaging of the abdomen and pelvis was performed using the standard protocol following bolus administration of intravenous contrast. CONTRAST:  127m OMNIPAQUE IOHEXOL 300 MG/ML  SOLN COMPARISON:  CT and pelvis, 09/03/2020 and 11/10/2016. FINDINGS: The examination is mildly degraded secondary to motion artifact. Lower chest: Multifocal cavitary lesions within the right lower lobe, new since 09/23/2020 (see key image). Hepatobiliary: No focal liver abnormality is seen. No gallstones, gallbladder wall thickening, or biliary dilatation. Pancreas: Unremarkable. No pancreatic ductal dilatation or surrounding inflammatory changes. Spleen: Normal in size without focal abnormality. Adrenals/Urinary Tract: Adrenal glands are unremarkable. Moderate rotational right renal anomaly. Trace right renal collecting system distention. No discrete hydronephrosis. Moderate distention of urinary bladder. Stomach/Bowel: Stomach is within normal limits. Appendix appears normal. No evidence of bowel wall thickening, distention, or inflammatory changes. Vascular/Lymphatic: No significant vascular findings are present. No enlarged abdominal or pelvic lymph nodes. Other: IUD. Tampon. No abdominal wall hernia or abnormality. No abdominopelvic ascites. Musculoskeletal: No acute or significant osseous findings. IMPRESSION: No acute abdominopelvic process. Electronically Signed: By: JMichaelle BirksMD On: 10/31/2020 12:02   CT FEMUR RIGHT WO CONTRAST  Result Date: 10/31/2020 CLINICAL  DATA:  Soft tissue infection of the thigh EXAM: CT OF THE LOWER RIGHT EXTREMITY WITHOUT CONTRAST TECHNIQUE: Multidetector CT imaging of the right lower extremity was performed according to the standard protocol. COMPARISON:  10/23/2020 radiographs FINDINGS: Bones/Joint/Cartilage No bony destructive findings characteristic the right femur. Of osteomyelitis involving no large hip joint effusion or substantial knee joint effusion identified. Ligaments Suboptimally assessed by CT. Muscles and Tendons Subtle infiltrative stranding along deep fascia planes without a well-defined drainable fluid collection. No gas tracks in the muscular tissues. Soft tissues Subcutaneous edema noted lateral to the right hip. Low-grade infiltrative edema tracks along the superficial fascia margin of the thigh laterally. At the level of the knee this is more confluent and tracks down into the lateral and posterior calf region. There is a lesser degree of medial subcutaneous edema distally in the thigh although not substantially asymmetric from the contralateral medial thigh based on inclusion of the contralateral medial thigh. IMPRESSION: 1. Low-level edema tracks along  the superficial fascia of the thigh and also there is some mild infiltrative edema along deep fascia planes in the thigh. No drainable abscess, osteomyelitis, or substantial joint effusion. This could be a manifestation of inflammation or third spacing of fluid. No gas in the soft tissues is identified to indicate a aggressive necrotizing infection; careful surveillance in a low threshold for reimaging suggested. Electronically Signed   By: Van Clines M.D.   On: 10/31/2020 14:34   DG Chest Port 1 View  Result Date: 10/31/2020 CLINICAL DATA:  Questionable sepsis EXAM: PORTABLE CHEST 1 VIEW COMPARISON:  None. FINDINGS: Heart is normal size. Patchy bilateral airspace opacities. No effusions or acute bony abnormality. IMPRESSION: Patchy bilateral airspace opacities  concerning for pneumonia. Electronically Signed   By: Rolm Baptise M.D.   On: 10/31/2020 10:53   ECHOCARDIOGRAM COMPLETE  Result Date: 11/01/2020    ECHOCARDIOGRAM REPORT   Patient Name:   RUNA WHITTINGHAM Date of Exam: 11/01/2020 Medical Rec #:  [id number]      Height:       64.0 in Accession #:    [id number]     Weight:       206.8 lb Date of Birth:  1990/11/29       BSA:          1.984 m Patient Age:    30 years       BP:           115/69 mmHg Patient Gender: F              HR:           135 bpm. Exam Location:  Inpatient Procedure: 2D Echo, Color Doppler and Cardiac Doppler Indications:    Bacteremia  History:        Patient has no prior history of Echocardiogram examinations.                 Risk Factors:Diabetes.  Sonographer:    Raquel Sarna Senior RDCS Referring Phys: [id number] Donita Brooks  Sonographer Comments: Echo performed with high heart rate to rule out structural cause, has been elevated since admission. IMPRESSIONS  1. Left ventricular ejection fraction, by estimation, is 70 to 75%. The left ventricle has hyperdynamic function. The left ventricle has no regional wall motion abnormalities. Indeterminate diastolic filling due to E-A fusion.  2. Right ventricular systolic function is normal. The right ventricular size is normal.  3. The mitral valve is normal in structure. No evidence of mitral valve regurgitation. No evidence of mitral stenosis.  4. The aortic valve is normal in structure. Aortic valve regurgitation is not visualized. No aortic stenosis is present.  5. The inferior vena cava is normal in size with greater than 50% respiratory variability, suggesting right atrial pressure of 3 mmHg. Conclusion(s)/Recommendation(s): No evidence of valvular vegetations on this transthoracic echocardiogram. Would recommend a transesophageal echocardiogram to exclude infective endocarditis if clinically indicated. FINDINGS  Left Ventricle: Left ventricular ejection fraction, by estimation, is 70 to 75%. The left  ventricle has hyperdynamic function. The left ventricle has no regional wall motion abnormalities. The left ventricular internal cavity size was normal in size. There is no left ventricular hypertrophy. Indeterminate diastolic filling due to E-A fusion. Right Ventricle: The right ventricular size is normal. No increase in right ventricular wall thickness. Right ventricular systolic function is normal. Left Atrium: Left atrial size was normal in size. Right Atrium: Right atrial size was normal in size. Pericardium: There is no evidence of pericardial effusion.  Mitral Valve: The mitral valve is normal in structure. No evidence of mitral valve regurgitation. No evidence of mitral valve stenosis. Tricuspid Valve: The tricuspid valve is normal in structure. Tricuspid valve regurgitation is not demonstrated. No evidence of tricuspid stenosis. Aortic Valve: The aortic valve is normal in structure. Aortic valve regurgitation is not visualized. No aortic stenosis is present. Pulmonic Valve: The pulmonic valve was normal in structure. Pulmonic valve regurgitation is not visualized. No evidence of pulmonic stenosis. Aorta: The aortic root is normal in size and structure. Venous: The inferior vena cava is normal in size with greater than 50% respiratory variability, suggesting right atrial pressure of 3 mmHg. IAS/Shunts: No atrial level shunt detected by color flow Doppler.  LEFT VENTRICLE PLAX 2D LVIDd:         3.40 cm LVIDs:         2.00 cm LV PW:         1.00 cm LV IVS:        1.00 cm LVOT diam:     2.00 cm LV SV:         44 LV SV Index:   22 LVOT Area:     3.14 cm  RIGHT VENTRICLE RV S prime:     15.70 cm/s TAPSE (M-mode): 1.8 cm LEFT ATRIUM           Index LA diam:      2.60 cm 1.31 cm/m LA Vol (A4C): 32.1 ml 16.18 ml/m  AORTIC VALVE LVOT Vmax:   94.20 cm/s LVOT Vmean:  70.900 cm/s LVOT VTI:    0.140 m  AORTA Ao Root diam: 3.40 cm Ao Asc diam:  2.90 cm  SHUNTS Systemic VTI:  0.14 m Systemic Diam: 2.00 cm Mihai Croitoru  MD Electronically signed by Sanda Klein MD Signature Date/Time: 11/01/2020/10:22:04 AM    Final         Scheduled Meds:  Chlorhexidine Gluconate Cloth  6 each Topical Daily   heparin  5,000 Units Subcutaneous Q8H   insulin aspart  0-15 Units Subcutaneous Q4H   insulin glargine-yfgn  26 Units Subcutaneous Daily   insulin starter kit- pen needles  1 kit Other Once   living well with diabetes book   Does not apply Once   Continuous Infusions:  albumin human 25 g (11/02/20 0450)   amiodarone 30 mg/hr (11/01/20 2155)   lactated ringers 10 mL/hr at 11/01/20 1536   vancomycin HCl 1,750 mg (11/01/20 1313)     LOS: 2 days     Cordelia Poche, MD [facility] 11/02/2020, 6:03 AM  If 7PM-7AM, please contact night-coverage [email or web address]

## 2020-11-02 NOTE — Progress Notes (Addendum)
Inpatient Diabetes Program Recommendations  AACE/ADA: New Consensus Statement on Inpatient Glycemic Control (2015)  Target Ranges:  Prepandial:   less than 140 mg/dL      Peak postprandial:   less than 180 mg/dL (1-2 hours)      Critically ill patients:  140 - 180 mg/dL   Lab Results  Component Value Date   GLUCAP 246 (H) 11/02/2020   HGBA1C 12.3 (H) 10/31/2020    Review of Glycemic Control Results for Chelsea Mathis, Chelsea Mathis (MRN [id number]) as of 11/02/2020 11:33  Ref. Range 11/02/2020 07:57 11/02/2020 11:26  Glucose-Capillary Latest Ref Range: 70 - 99 mg/dL 323 (H) 246 (H)   Diabetes history: DM 2- New diagnosis-Patient has history of gestational DM Outpatient Diabetes medications: None Current orders for Inpatient glycemic control:  Novolog moderate q 4 hours Semglee 26 units daily Inpatient Diabetes Program Recommendations:    Please consider increasing Semglee to 34 units daily.  Once eating consistently would also likely benefit from Novolog meal coverage.    Called patient by phone to discuss new diagnosis of DM.  She states that she has a history of gestational DM however was never on insulin.  Briefly discussed new diagnosis.  Patient was sleepy.  Explained that she will need insulin at d/c and will need education prior to d/c.  She stated that she has a lot going on and wonders if someone can come tomorrow.  Patient has Living well with DM book and insulin starter kit.  Will ask RN to start insulin teaching and show patient DM videos.  Diabetes coordinator will f/u wit patient on 8/4 and also show her insulin pen.   Patient appreciative.   Thanks  Adah Perl, RN, BC-ADM Inpatient Diabetes Coordinator Pager [phone]  (8a-5p)

## 2020-11-02 NOTE — Progress Notes (Signed)
Called in by patient saying that the patient was cold. Patient tachycardic, tachypenic and low sats on assessment. Data processing manager notified. MD Jacquelin Hawking notified. One time order for IV Metoprolol received and administered. 12 lead EKG done and placed in the chart. Patient placed on non-rebreather mask. Will continue to follow.

## 2020-11-02 NOTE — Progress Notes (Signed)
[facility] Infectious Diseases Follow Up Note  Patient Identification: Patient Name: Chelsea RocherReginee Smoak MRN: [id number] Admit Date: 10/31/2020  9:34 AM Age: 30 y.o.Today's Date: 11/02/2020   Reason for Visit: MRSA bacteremia  Active Problems:   DKA (diabetic ketoacidosis) (HCC)  Antibiotics: Vancomycin-c                    clindamycin/zosyn 8/2    Lines/Tubes: PIVs    Interval Events: Continues to remain afebrile, WBC has been stable at 18.  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  Follow-up repeat blood cultures 8/2 TTE is negative for endocarditis.  Will need a TEE to rule out endocarditis Check for hep C antibody She is still tender everywhere I touch. Seems most of her pain is in her rt thigh/hip and left thigh hip. I will order MRI left femur including thigh  Might need to consider re-imaging right thigh/hip if continues to have severe pain and tenderness Discussed with primary and ID pharmacy Monitor CBC, BMP and Vancomycin trough  Following   Rest of the management as per the primary team. Thank you for the consult. Please page with pertinent questions or concerns.  ______________________________________________________________________ Subjective patient seen and examined at the bedside.  Her fianc is at the bedside.  He tells me it all started from the pain in her right groin which wrapped around the right hip.  She continues to feel  poorly and complains of pain all over her body.  Denies any nausea vomiting diarrhea rashes.    Vitals BP 96/64 (BP Location: Right Arm)   Pulse (!) 109   Temp 98.3 F (36.8 C) (Oral)   Resp (!) 21   Ht 5\' 4"  (1.626 m)   Wt 98 kg   LMP 10/31/2020   SpO2 100%   BMI 37.09 kg/m     Physical Exam Constitutional: Looks acutely sick and tired    Comments:   Cardiovascular:     Rate and Rhythm: Normal rate and regular rhythm.     Heart sounds:   Pulmonary:     Effort: Pulmonary effort is normal.     Comments:   Abdominal:     Palpations: Abdomen is soft.     Tenderness: Nontender and nondistended  Musculoskeletal:        General: Tenderness in the right thigh, no fluctuance or crepitus palpated.                    Left thigh and hip tenderness                   Limited ROM of both LE due to excessive pain  Skin:    Comments: No lesions or rashes   Neurological:     General: No focal deficit present.   Psychiatric:        Mood and Affect: Mood normal.    Pertinent Microbiology Results for orders placed or performed during the hospital encounter of 10/31/20  Resp Panel by RT-PCR (Flu A&B, Covid) Nasopharyngeal Swab  Status: None   Collection Time: 10/31/20 10:53 AM   Specimen: Nasopharyngeal Swab; Nasopharyngeal(NP) swabs in vial transport medium  Result Value Ref Range Status   SARS Coronavirus 2 by RT PCR NEGATIVE NEGATIVE Final    Comment: (NOTE) SARS-CoV-2 target nucleic acids are NOT DETECTED.  The SARS-CoV-2 RNA is generally detectable in upper respiratory specimens during the acute phase of infection. The lowest concentration of SARS-CoV-2 viral copies this assay can detect is 138 copies/mL. A negative result does not preclude SARS-Cov-2 infection and should not be used as the sole basis for treatment or other patient management decisions. A negative result may occur with  improper specimen collection/handling, submission of specimen other than  nasopharyngeal swab, presence of viral mutation(s) within the areas targeted by this assay, and inadequate number of viral copies(<138 copies/mL). A negative result must be combined with clinical observations, patient history, and epidemiological information. The expected result is Negative.  Fact Sheet for Patients:  BloggerCourse.com  Fact Sheet for Healthcare Providers:  SeriousBroker.it  This test is no t yet approved or cleared by the [place] FDA and  has been authorized for detection and/or diagnosis of SARS-CoV-2 by FDA under an Emergency Use Authorization (EUA). This EUA will remain  in effect (meaning this test can be used) for the duration of the COVID-19 declaration under Section 564(b)(1) of the Act, 21 U.S.C.section 360bbb-3(b)(1), unless the authorization is terminated  or revoked sooner.       Influenza A by PCR NEGATIVE NEGATIVE Final   Influenza B by PCR NEGATIVE NEGATIVE Final    Comment: (NOTE) The Xpert Xpress SARS-CoV-2/FLU/RSV plus assay is intended as an aid in the diagnosis of influenza from Nasopharyngeal swab specimens and should not be used as a sole basis for treatment. Nasal washings and aspirates are unacceptable for Xpert Xpress SARS-CoV-2/FLU/RSV testing.  Fact Sheet for Patients: BloggerCourse.com  Fact Sheet for Healthcare Providers: SeriousBroker.it  This test is not yet approved or cleared by the [place] FDA and has been authorized for detection and/or diagnosis of SARS-CoV-2 by FDA under an Emergency Use Authorization (EUA). This EUA will remain in effect (meaning this test can be used) for the duration of the COVID-19 declaration under Section 564(b)(1) of the Act, 21 U.S.C. section 360bbb-3(b)(1), unless the authorization is terminated or revoked.  Performed at [facility], [address] [place], [place] [address]    Blood culture (routine single)     Status: Abnormal (Preliminary result)   Collection Time: 10/31/20 11:00 AM   Specimen: BLOOD LEFT FOREARM  Result Value Ref Range Status   Specimen Description BLOOD LEFT FOREARM  Final   Special Requests   Final    BOTTLES DRAWN AEROBIC AND ANAEROBIC Blood Culture adequate volume   Culture  Setup Time   Final    GRAM POSITIVE COCCI IN CLUSTERS IN BOTH AEROBIC AND ANAEROBIC BOTTLES Organism ID to follow CRITICAL RESULT CALLED TO, READ BACK BY AND VERIFIED WITH: J. LEDFORD PHARMD, AT 0221 11/01/20 D. VANHOOK    Culture (A)  Final    STAPHYLOCOCCUS AUREUS CULTURE REINCUBATED FOR BETTER GROWTH Performed at [facility], [address], [place], [place] [address]    Report Status PENDING  Incomplete  Blood Culture ID Panel (Reflexed)     Status: Abnormal   Collection Time: 10/31/20 11:00 AM  Result Value Ref Range Status   Enterococcus faecalis NOT DETECTED NOT DETECTED Final   Enterococcus Faecium NOT DETECTED NOT DETECTED Final  Listeria monocytogenes NOT DETECTED NOT DETECTED Final   Staphylococcus species DETECTED (A) NOT DETECTED Final    Comment: CRITICAL RESULT CALLED TO, READ BACK BY AND VERIFIED WITH: J. LEDFORD PHARMD, AT 0221 11/01/20 D. VANHOOK    Staphylococcus aureus (BCID) DETECTED (A) NOT DETECTED Final    Comment: Methicillin (oxacillin)-resistant Staphylococcus aureus (MRSA). MRSA is predictably resistant to beta-lactam antibiotics (except ceftaroline). Preferred therapy is vancomycin unless clinically contraindicated. Patient requires contact precautions if  hospitalized. CRITICAL RESULT CALLED TO, READ BACK BY AND VERIFIED WITH: J. LEDFORD PHARMD, AT 0221 11/01/20 D. VANHOOK    Staphylococcus epidermidis NOT DETECTED NOT DETECTED Final   Staphylococcus lugdunensis NOT DETECTED NOT DETECTED Final   Streptococcus species NOT DETECTED NOT DETECTED Final   Streptococcus agalactiae NOT DETECTED NOT DETECTED Final   Streptococcus  pneumoniae NOT DETECTED NOT DETECTED Final   Streptococcus pyogenes NOT DETECTED NOT DETECTED Final   A.calcoaceticus-baumannii NOT DETECTED NOT DETECTED Final   Bacteroides fragilis NOT DETECTED NOT DETECTED Final   Enterobacterales NOT DETECTED NOT DETECTED Final   Enterobacter cloacae complex NOT DETECTED NOT DETECTED Final   Escherichia coli NOT DETECTED NOT DETECTED Final   Klebsiella aerogenes NOT DETECTED NOT DETECTED Final   Klebsiella oxytoca NOT DETECTED NOT DETECTED Final   Klebsiella pneumoniae NOT DETECTED NOT DETECTED Final   Proteus species NOT DETECTED NOT DETECTED Final   Salmonella species NOT DETECTED NOT DETECTED Final   Serratia marcescens NOT DETECTED NOT DETECTED Final   Haemophilus influenzae NOT DETECTED NOT DETECTED Final   Neisseria meningitidis NOT DETECTED NOT DETECTED Final   Pseudomonas aeruginosa NOT DETECTED NOT DETECTED Final   Stenotrophomonas maltophilia NOT DETECTED NOT DETECTED Final   Candida albicans NOT DETECTED NOT DETECTED Final   Candida auris NOT DETECTED NOT DETECTED Final   Candida glabrata NOT DETECTED NOT DETECTED Final   Candida krusei NOT DETECTED NOT DETECTED Final   Candida parapsilosis NOT DETECTED NOT DETECTED Final   Candida tropicalis NOT DETECTED NOT DETECTED Final   Cryptococcus neoformans/gattii NOT DETECTED NOT DETECTED Final   Meth resistant mecA/C and MREJ DETECTED (A) NOT DETECTED Final    Comment: CRITICAL RESULT CALLED TO, READ BACK BY AND VERIFIED WITH: J. LEDFORD PHARMD, AT 0221 11/01/20 Renato Shin Performed at [facility], [address] [place], [place] [address]   Urine Culture     Status: Abnormal   Collection Time: 10/31/20  1:29 PM   Specimen: In/Out Cath Urine  Result Value Ref Range Status   Specimen Description IN/OUT CATH URINE  Final   Special Requests   Final    NONE Performed at [facility], [address] [place], [place] [address]    Culture MULTIPLE SPECIES PRESENT, SUGGEST  RECOLLECTION (A)  Final   Report Status 11/01/2020 FINAL  Final  MRSA Next Gen by PCR, Nasal     Status: Abnormal   Collection Time: 10/31/20  7:50 PM   Specimen: Nasal Mucosa; Nasal Swab  Result Value Ref Range Status   MRSA by PCR Next Gen DETECTED (A) NOT DETECTED Final    Comment: RESULT CALLED TO, READ BACK BY AND VERIFIED WITH: WARNER,B RN AT 2144 10/31/2020 MITCHELL,L (NOTE) The GeneXpert MRSA Assay (FDA approved for NASAL specimens only), is one component of a comprehensive MRSA colonization surveillance program. It is not intended to diagnose MRSA infection nor to guide or monitor treatment for MRSA infections. Test performance is not FDA approved in patients less than 65 years old. Performed  at [facility], [address] [place], [place] [address]     Pertinent Lab. CBC Latest Ref Rng & Units 11/02/2020 11/01/2020 10/31/2020  WBC 4.0 - 10.5 K/uL 18.3(H) 18.9(H) -  Hemoglobin 12.0 - 15.0 g/dL 7.9(L) 10.8(L) -  Hematocrit 36.0 - 46.0 % 23.0(L) 30.3(L) -  Platelets 150 - 400 K/uL 53(L) 102(L) 120(L)   CMP Latest Ref Rng & Units 11/02/2020 11/01/2020 10/31/2020  Glucose 70 - 99 mg/dL 893(Y) 101(B) 510(C)  BUN 6 - 20 mg/dL 19 20 58(N)  Creatinine 0.44 - 1.00 mg/dL 2.77 8.24 2.35  Sodium 135 - 145 mmol/L 127(L) 132(L) 131(L)  Potassium 3.5 - 5.1 mmol/L 3.6 3.5 3.5  Chloride 98 - 111 mmol/L 91(L) 98 99  CO2 22 - 32 mmol/L 27 24 24   Calcium 8.9 - 10.3 mg/dL 8.1(L) 8.1(L) 7.8(L)  Total Protein 6.5 - 8.1 g/dL - 5.4(L) -  Total Bilirubin 0.3 - 1.2 mg/dL - 0.9 -  Alkaline Phos 38 - 126 U/L - 106 -  AST 15 - 41 U/L - 55(H) -  ALT 0 - 44 U/L - 21 -     Pertinent Imaging today Plain films and CT images have been personally visualized and interpreted; radiology reports have been reviewed. Decision making incorporated into the Impression / Recommendations.  I spent more than 35 minutes for this patient encounter including review of prior medical records, coordination of care  with  greater than 50% of time being face to face/counseling and discussing diagnostics/treatment plan with the patient/family.  Electronically signed by:   , MD Infectious Disease Physician [facility] for Infectious Disease Pager: [phone]

## 2020-11-02 NOTE — Progress Notes (Signed)
Pt having nose bleed. Added humidification to nasal cannula. Packed pts rt nostril with guaze on mutiple attempts to try and stop bleed. Reached out to on call MD asking for orders for Afrin. Waiting for response.

## 2020-11-03 ENCOUNTER — Inpatient Hospital Stay (HOSPITAL_COMMUNITY): Payer: Medicaid Other | Admitting: Anesthesiology

## 2020-11-03 ENCOUNTER — Encounter (HOSPITAL_COMMUNITY)
Admission: EM | Disposition: A | Discharge status: Other Acute Inpatient Hospital | Payer: Self-pay | Source: Home / Self Care | Attending: Family Medicine

## 2020-11-03 ENCOUNTER — Encounter (HOSPITAL_COMMUNITY): Payer: Self-pay | Admitting: Internal Medicine

## 2020-11-03 ENCOUNTER — Inpatient Hospital Stay (HOSPITAL_COMMUNITY): Payer: Medicaid Other

## 2020-11-03 DIAGNOSIS — R7881 Bacteremia: Secondary | ICD-10-CM

## 2020-11-03 DIAGNOSIS — A419 Sepsis, unspecified organism: Secondary | ICD-10-CM | POA: Diagnosis not present

## 2020-11-03 DIAGNOSIS — R6520 Severe sepsis without septic shock: Secondary | ICD-10-CM | POA: Diagnosis not present

## 2020-11-03 HISTORY — PX: TEE WITHOUT CARDIOVERSION: SHX5443

## 2020-11-03 LAB — GLUCOSE, CAPILLARY
Glucose-Capillary: 311 mg/dL — ABNORMAL HIGH (ref 70–99)
Glucose-Capillary: 298 mg/dL — ABNORMAL HIGH (ref 70–99)
Glucose-Capillary: 262 mg/dL — ABNORMAL HIGH (ref 70–99)
Glucose-Capillary: 194 mg/dL — ABNORMAL HIGH (ref 70–99)

## 2020-11-03 LAB — C-REACTIVE PROTEIN: CRP: 32.8 mg/dL — ABNORMAL HIGH (ref <1.0)

## 2020-11-03 LAB — CULTURE, BLOOD (SINGLE): Special Requests: ADEQUATE

## 2020-11-03 LAB — CBC
HCT: 22.7 % — ABNORMAL LOW (ref 36.0–46.0)
Hemoglobin: 7.5 g/dL — ABNORMAL LOW (ref 12.0–15.0)
MCH: 25.3 pg — ABNORMAL LOW (ref 26.0–34.0)
MCHC: 33 g/dL (ref 30.0–36.0)
MCV: 76.7 fL — ABNORMAL LOW (ref 80.0–100.0)
Platelets: 61 10*3/uL — ABNORMAL LOW (ref 150–400)
RBC: 2.96 MIL/uL — ABNORMAL LOW (ref 3.87–5.11)
RDW: 13.3 % (ref 11.5–15.5)
WBC: 23.2 10*3/uL — ABNORMAL HIGH (ref 4.0–10.5)
nRBC: 0 % (ref 0.0–0.2)

## 2020-11-03 LAB — BASIC METABOLIC PANEL
Anion gap: 13 (ref 5–15)
BUN: 13 mg/dL (ref 6–20)
CO2: 27 mmol/L (ref 22–32)
Calcium: 8.2 mg/dL — ABNORMAL LOW (ref 8.9–10.3)
Chloride: 89 mmol/L — ABNORMAL LOW (ref 98–111)
Creatinine, Ser: 0.6 mg/dL (ref 0.44–1.00)
GFR, Estimated: >60 mL/min (ref >60)
Glucose, Bld: 275 mg/dL — ABNORMAL HIGH (ref 70–99)
Potassium: 3.7 mmol/L (ref 3.5–5.1)
Sodium: 129 mmol/L — ABNORMAL LOW (ref 135–145)

## 2020-11-03 LAB — PHOSPHORUS: Phosphorus: 3.5 mg/dL (ref 2.5–4.6)

## 2020-11-03 LAB — POCT PREGNANCY, URINE: Preg Test, Ur: NEGATIVE

## 2020-11-03 LAB — PROCALCITONIN: Procalcitonin: 5.7 ng/mL

## 2020-11-03 IMAGING — MR MR FEMUR*R* WO/W CM
2 of 10 series · 4 of 40 positions shown · IV contrast (gadavist)
Comparison: CT right femur [DATE]

CLINICAL DATA: Soft tissue infection suspected, thigh, no prior
imaging Severe left thigh pain in the setting of MRSA bacteremia

EXAM:
MR OF THE LEFT LOWER EXTREMITY WITHOUT AND WITH CONTRAST; MRI OF THE
RIGHT FEMUR WITHOUT AND WITH CONTRAST
TECHNIQUE: Multiplanar, multisequence MR imaging of the left femur was
performed both before and after administration of intravenous
contrast.
Multiplanar, multisequence MR imaging of the right femur was
CONTRAST:  10mL GADAVIST GADOBUTROL 1 MMOL/ML IV SOLN

[Series 3: T1 · coronal · 6.0mm · 0.45mm/px · 2 of 28 slices shown (1 of 2)]
[im 1/28]
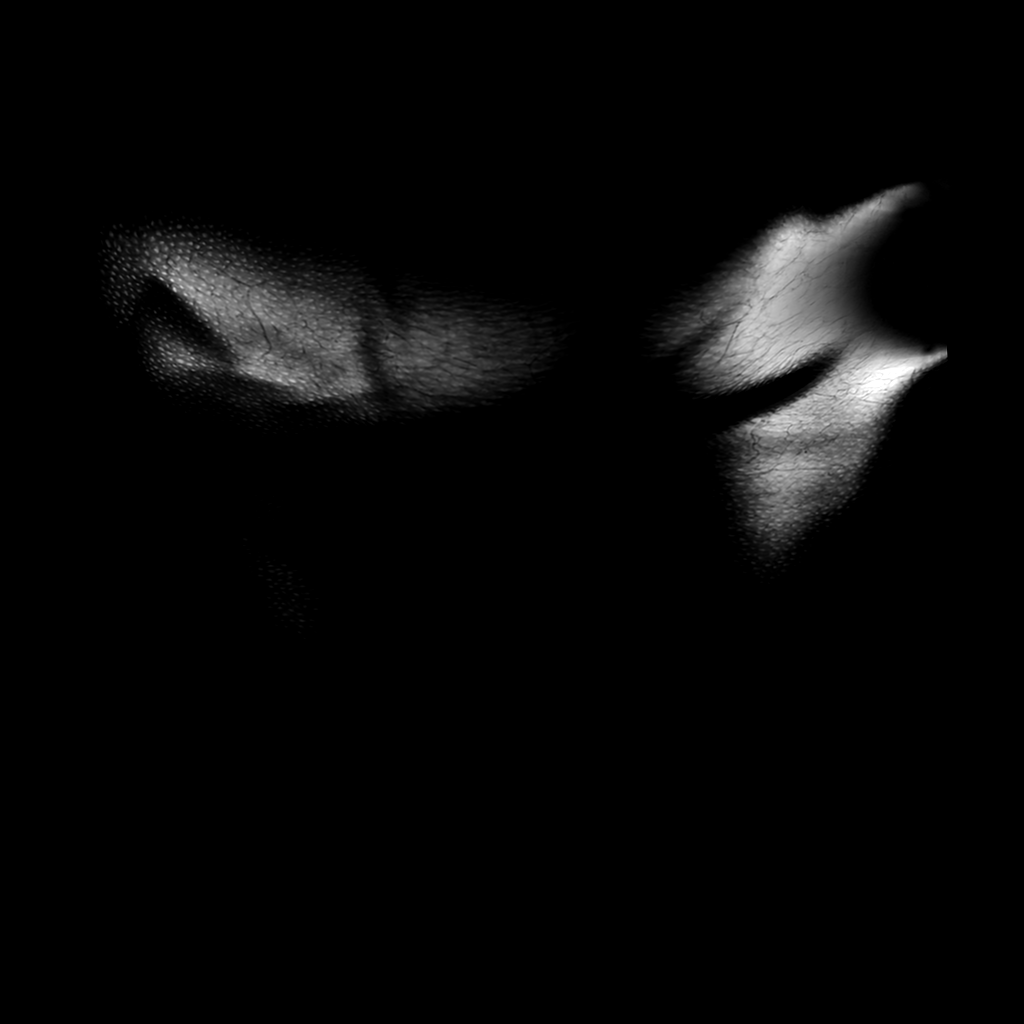
[im 28/28]
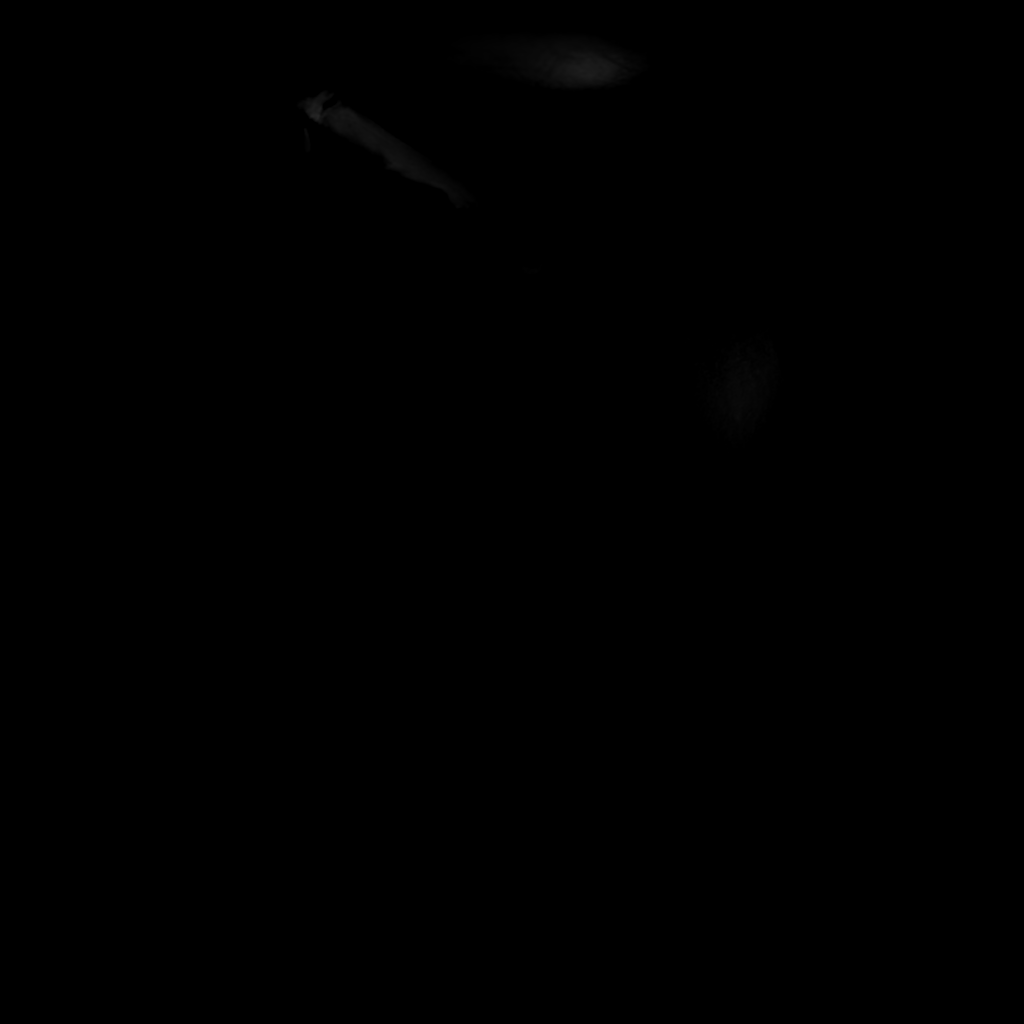

[Series 5: T1 · axial · 6.5mm · 0.43mm/px · z∈[-240,-44]mm · 2 of 58 slices shown (2 of 2)]
[im 12/58]
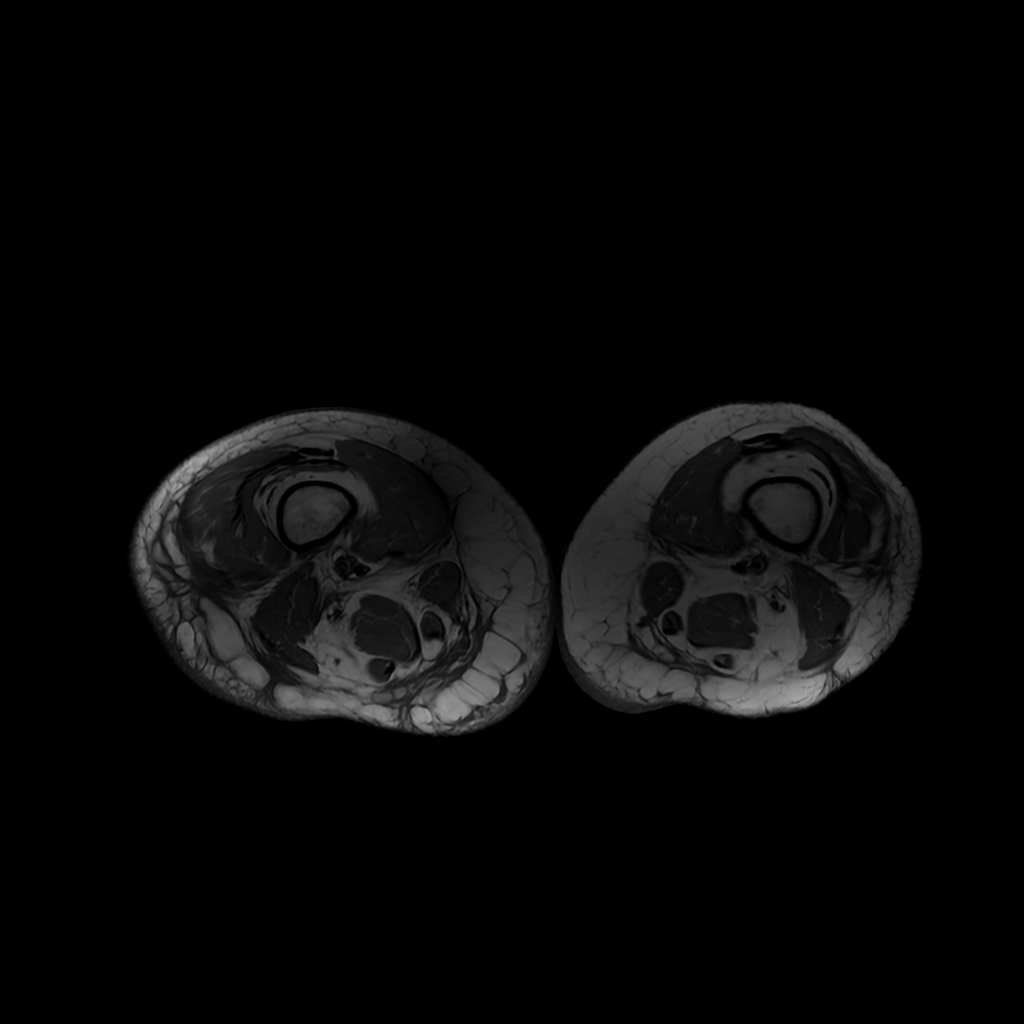
[im 35/58]
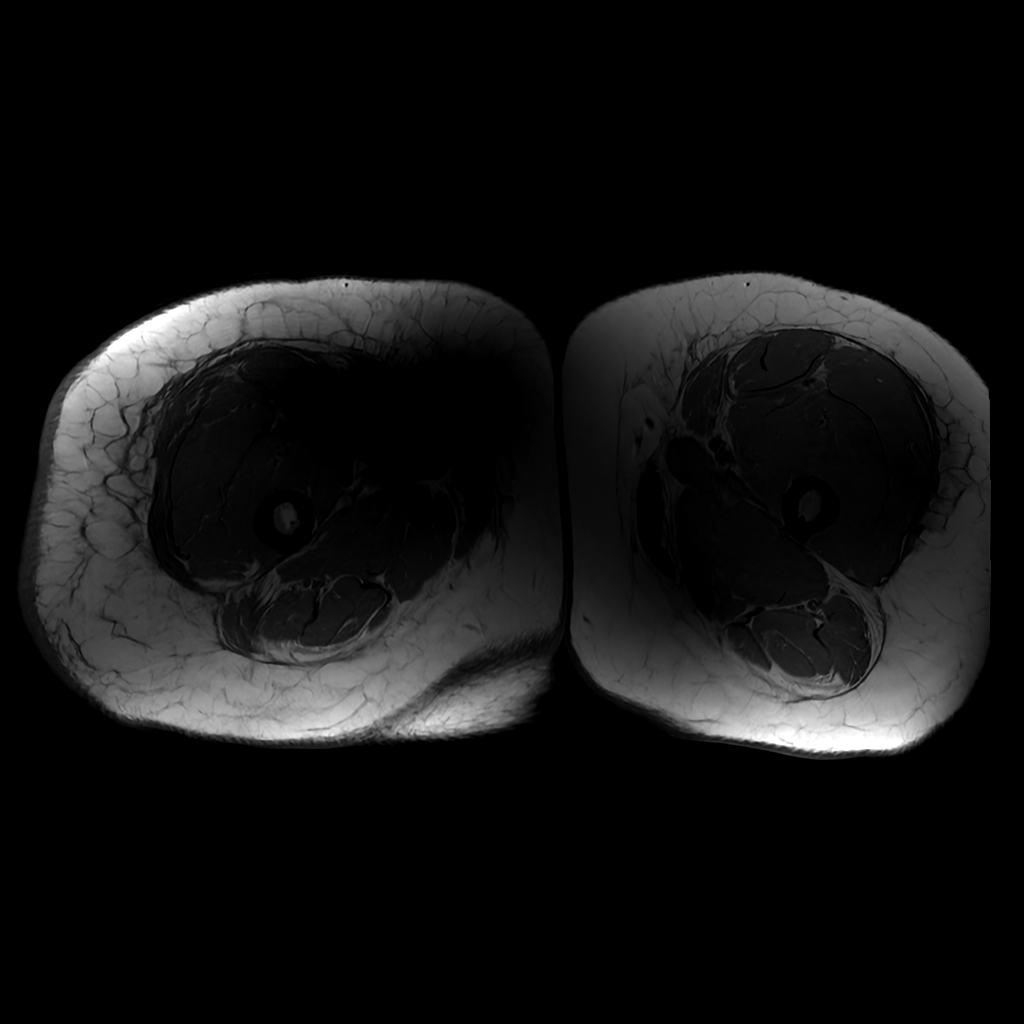

[4 of 40 positions shown; findings below may reference images not displayed]

FINDINGS: Bones/Joint/Cartilage

The cortex is intact. There is no significant marrow signal
alteration. No evidence of osteomyelitis. The hip and knee joints
are not well visualized due to artifact, but there are partially
visualized bilateral knee joint effusions.

Ligaments

Hip and knee joint ligamentous structures are not well visualized
due to artifact.

Muscles and Tendons

Extensive intramuscular edema throughout the muscles of the thighs,
predominantly in the anteromedial compartments and to a lesser
degree the posterior compartments. There is perifascial edema and
intermuscular fascial edematous thickening bilaterally, right
greater than left.

In the left thigh, there are multiple intermuscular collections with
adjacent fascial enhancement throughout all 3 muscular compartments.
There are multiple areas of intramuscular non enhancement with
surrounding rim enhancement in the adductors and in the anterior
compartment (specifically the vastus intermedius muscle).

In the right thigh, there are small areas of ill-defined enhancement
within the gracilis muscle, adductor magnus muscle, and biceps
femoris muscle (axial postcontrast images 27, 28, and 31). There is
no intermuscular fascial enhancement or drainable fluid collection.

Soft tissues

Subcutaneous soft tissue swelling, right greater than left.
IMPRESSION: Left thigh: Myositis and myofasciitis with appearance highly
concerning for aggressive soft tissue infectious process. This is
evidenced by intermuscular fascial collections and fascial
enhancement throughout the muscle compartments. Multiple areas of
intramuscular rim enhancement/central non enhancement in the
adductors and the vastus intermedius which could reflect small
intramuscular abscesses or possibly diabetic myonecrosis.

Right thigh: Myositis and myofasciitis, with overall severity of a
much lesser degree than the left side. There are small areas of
enhancement within the gracilis muscle, adductor magnus, and biceps
femoris, which could be tiny early developing abscesses. No fascial
enhancement or evidence of drainable fluid collection at this time.

Recommend surgical consultation.

These results were called by telephone at the time of interpretation
on [DATE] at [DATE] to provider NAIBY R , who verbally
acknowledged these results.

## 2020-11-03 IMAGING — MR MR FEMUR*L* WO/W CM
2 of 10 series · 4 of 40 positions shown · IV contrast (6.8 GAD)
Comparison: CT right femur [DATE]

CLINICAL DATA: Soft tissue infection suspected, thigh, no prior
imaging Severe left thigh pain in the setting of MRSA bacteremia

EXAM:
MR OF THE LEFT LOWER EXTREMITY WITHOUT AND WITH CONTRAST; MRI OF THE
RIGHT FEMUR WITHOUT AND WITH CONTRAST
TECHNIQUE: Multiplanar, multisequence MR imaging of the left femur was
performed both before and after administration of intravenous
contrast.
Multiplanar, multisequence MR imaging of the right femur was
CONTRAST:  10mL GADAVIST GADOBUTROL 1 MMOL/ML IV SOLN

[Series 3: T1 · coronal · 6.0mm · 0.45mm/px · 2 of 28 slices shown (1 of 2)]
[im 1/28]
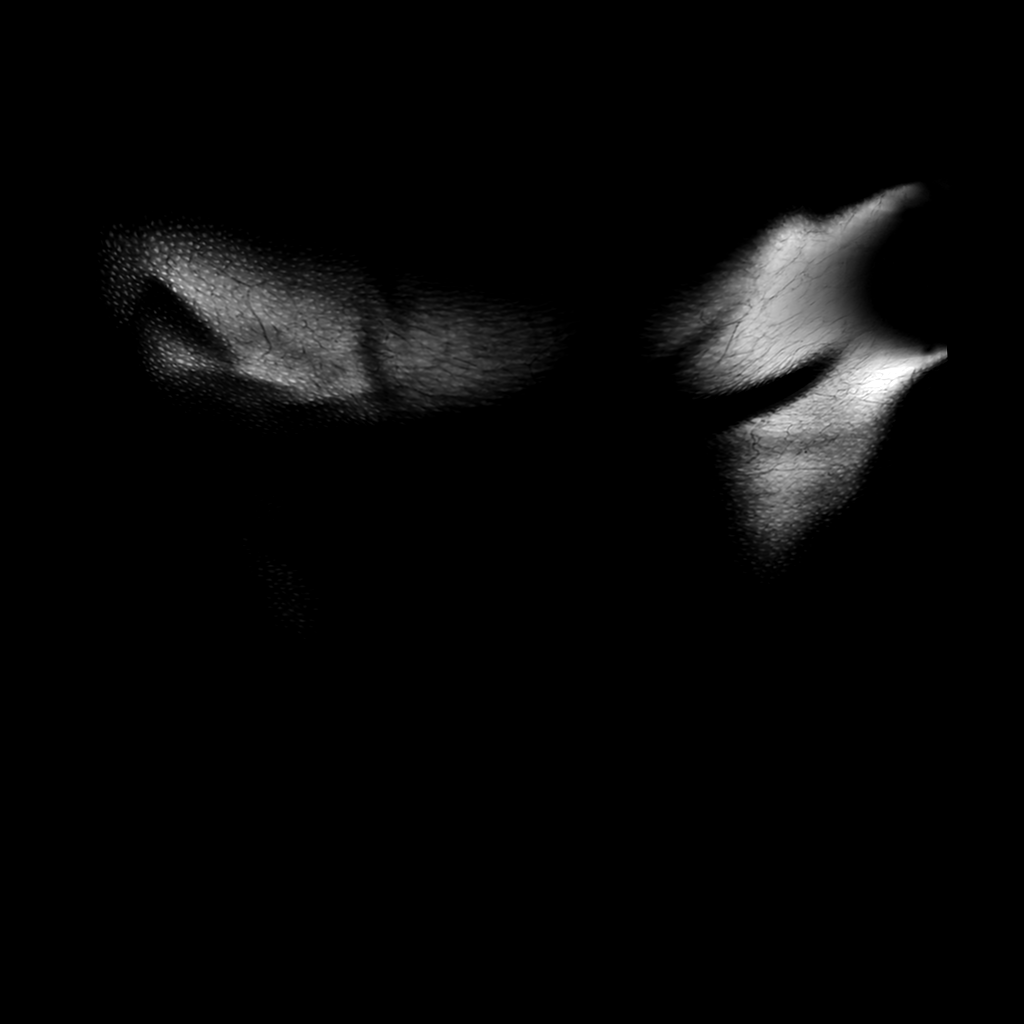
[im 28/28]
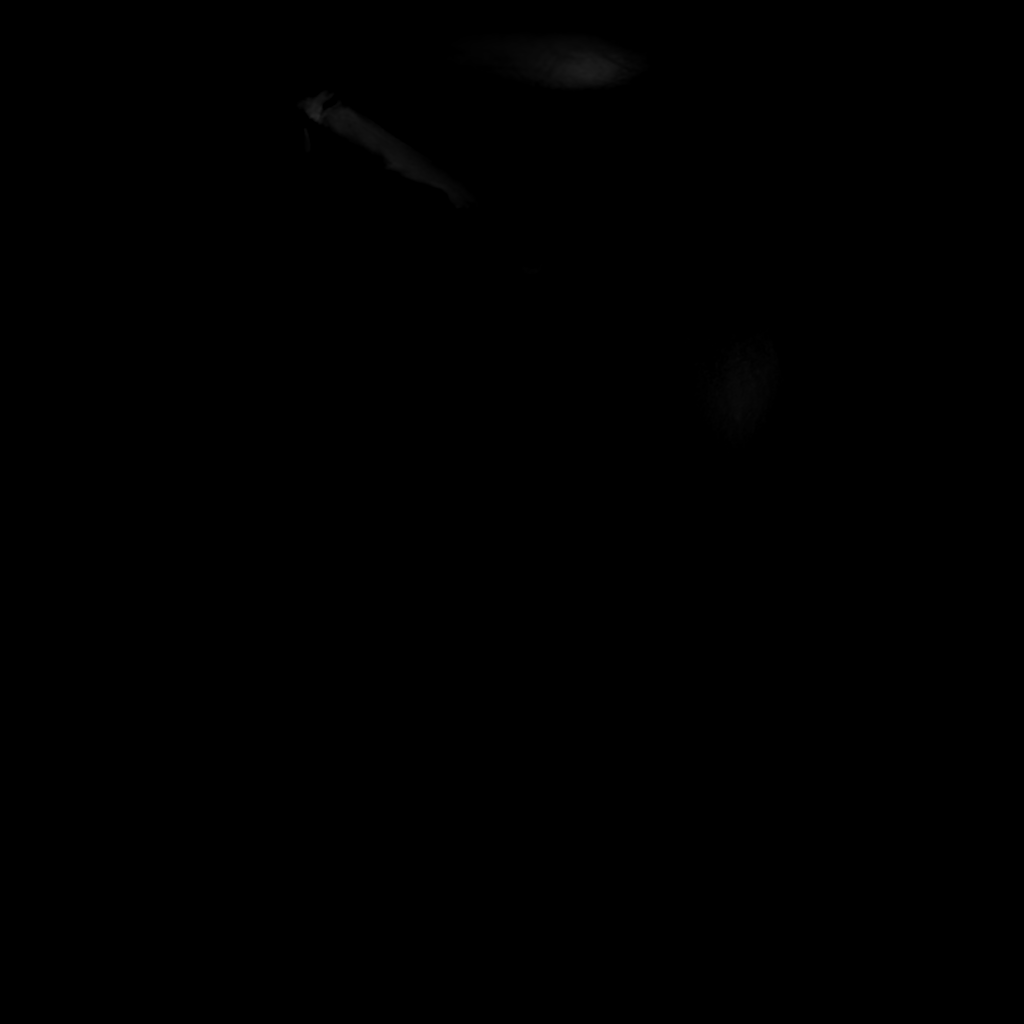

[Series 5: T1 · axial · 6.5mm · 0.43mm/px · z∈[-240,-44]mm · 2 of 58 slices shown (2 of 2)]
[im 12/58]
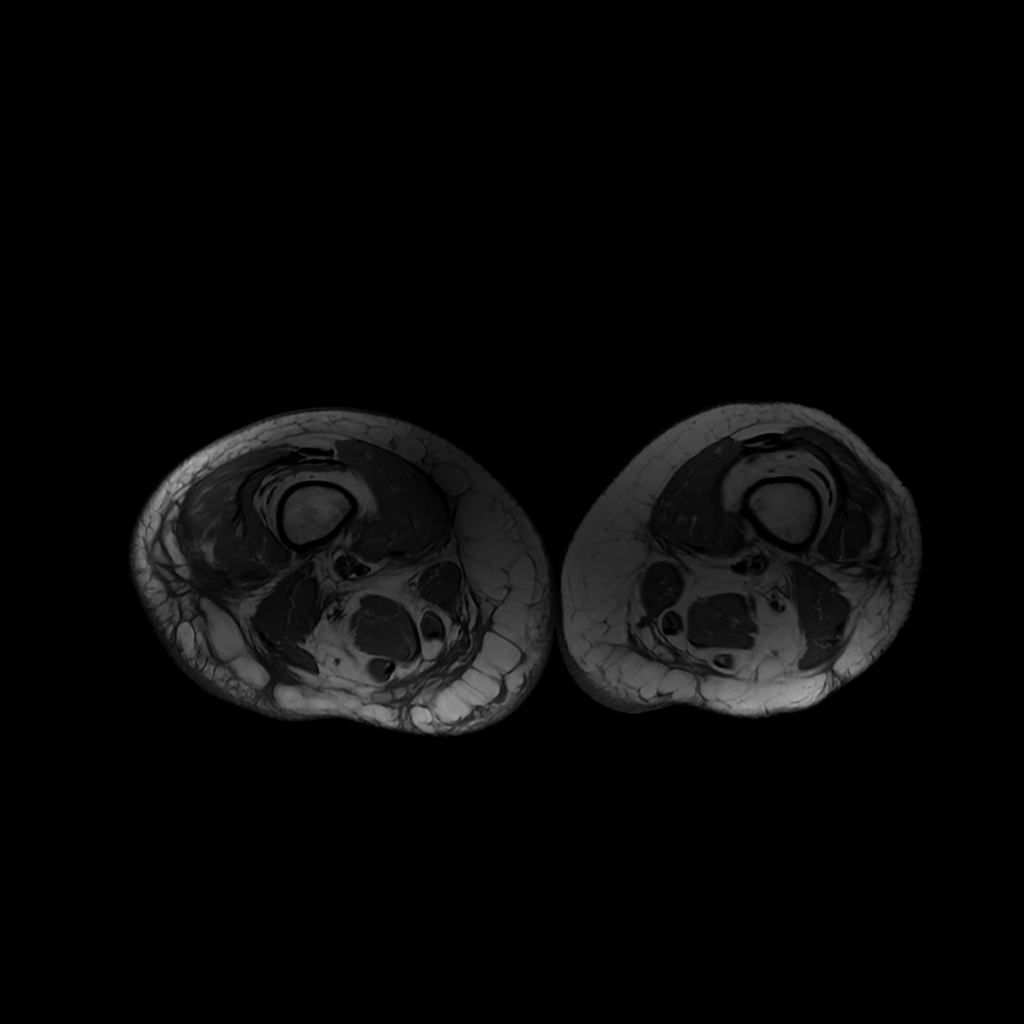
[im 35/58]
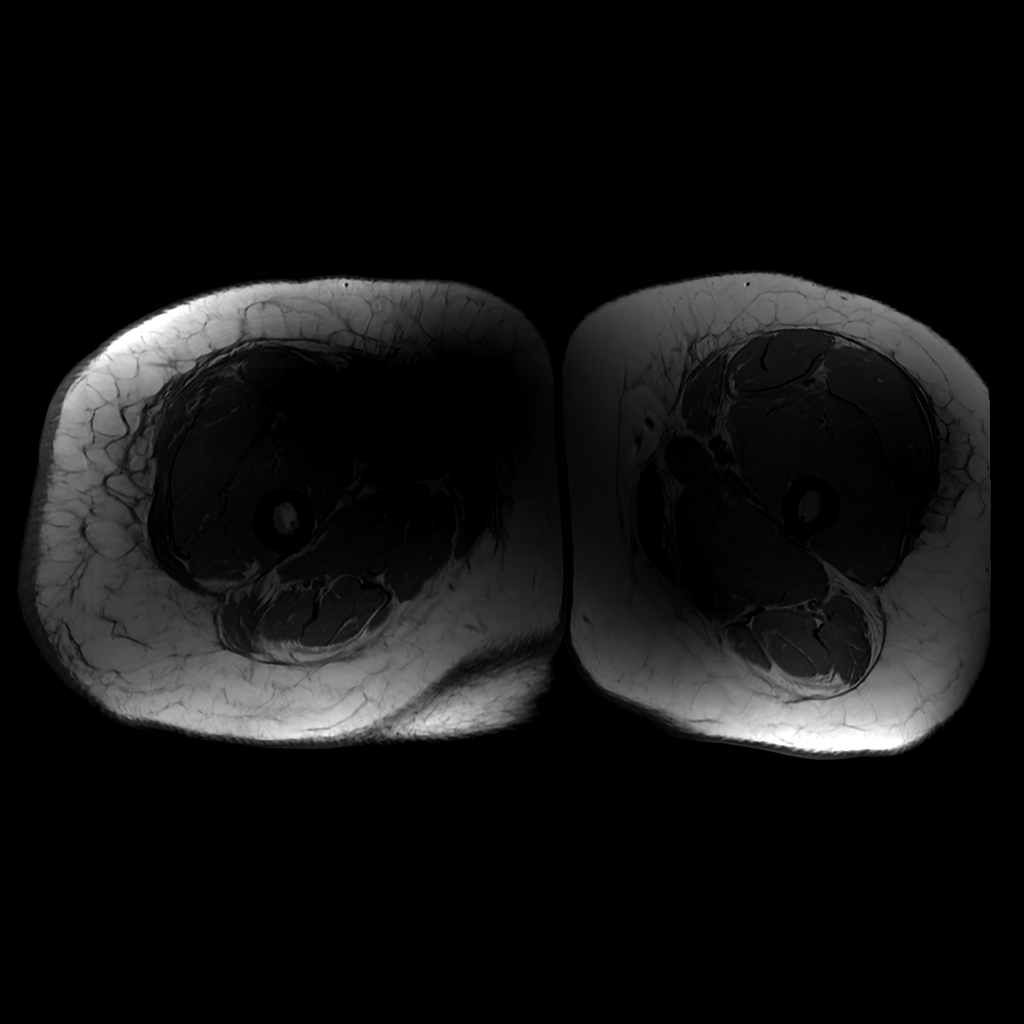

[4 of 40 positions shown; findings below may reference images not displayed]

FINDINGS: Bones/Joint/Cartilage

The cortex is intact. There is no significant marrow signal
alteration. No evidence of osteomyelitis. The hip and knee joints
are not well visualized due to artifact, but there are partially
visualized bilateral knee joint effusions.

Ligaments

Hip and knee joint ligamentous structures are not well visualized
due to artifact.

Muscles and Tendons

Extensive intramuscular edema throughout the muscles of the thighs,
predominantly in the anteromedial compartments and to a lesser
degree the posterior compartments. There is perifascial edema and
intermuscular fascial edematous thickening bilaterally, right
greater than left.

In the left thigh, there are multiple intermuscular collections with
adjacent fascial enhancement throughout all 3 muscular compartments.
There are multiple areas of intramuscular non enhancement with
surrounding rim enhancement in the adductors and in the anterior
compartment (specifically the vastus intermedius muscle).

In the right thigh, there are small areas of ill-defined enhancement
within the gracilis muscle, adductor magnus muscle, and biceps
femoris muscle (axial postcontrast images 27, 28, and 31). There is
no intermuscular fascial enhancement or drainable fluid collection.

Soft tissues

Subcutaneous soft tissue swelling, right greater than left.
IMPRESSION: Left thigh: Myositis and myofasciitis with appearance highly
concerning for aggressive soft tissue infectious process. This is
evidenced by intermuscular fascial collections and fascial
enhancement throughout the muscle compartments. Multiple areas of
intramuscular rim enhancement/central non enhancement in the
adductors and the vastus intermedius which could reflect small
intramuscular abscesses or possibly diabetic myonecrosis.

Right thigh: Myositis and myofasciitis, with overall severity of a
much lesser degree than the left side. There are small areas of
enhancement within the gracilis muscle, adductor magnus, and biceps
femoris, which could be tiny early developing abscesses. No fascial
enhancement or evidence of drainable fluid collection at this time.

Recommend surgical consultation.

These results were called by telephone at the time of interpretation
on [DATE] at [DATE] to provider NAIBY R , who verbally
acknowledged these results.

## 2020-11-03 SURGERY — ECHOCARDIOGRAM, TRANSESOPHAGEAL
Anesthesia: Monitor Anesthesia Care

## 2020-11-03 MED ORDER — ADULT MULTIVITAMIN W/MINERALS CH
1.0000 | ORAL_TABLET | Freq: Every day | ORAL | Status: DC
  Administered 2020-11-04 – 2020-11-11 (×8): 1 via ORAL
  Filled 2020-11-03 (×8): qty 1

## 2020-11-03 MED ORDER — LACTATED RINGERS IV SOLN: INTRAVENOUS | Status: DC

## 2020-11-03 MED ORDER — BUTAMBEN-TETRACAINE-BENZOCAINE 2-2-14 % EX AERO
INHALATION_SPRAY | CUTANEOUS | Status: DC | PRN
  Administered 2020-11-03: 2 via TOPICAL

## 2020-11-03 MED ORDER — VANCOMYCIN HCL 1250 MG/250ML IV SOLN
1250.0000 mg | Freq: Two times a day (BID) | INTRAVENOUS | Status: DC
  Administered 2020-11-03 – 2020-11-07 (×8): 1250 mg via INTRAVENOUS
  Filled 2020-11-03 (×9): qty 250

## 2020-11-03 MED ORDER — VANCOMYCIN HCL 1.25 G IV SOLR
1250.0000 mg | Freq: Two times a day (BID) | INTRAVENOUS | Status: DC
  Filled 2020-11-03 (×2): qty 1250

## 2020-11-03 MED ORDER — LACTATED RINGERS IV SOLN: INTRAVENOUS | Status: DC | PRN

## 2020-11-03 MED ORDER — LACTATED RINGERS IV BOLUS
1000.0000 mL | Freq: Once | INTRAVENOUS | Status: AC
  Administered 2020-11-04: 1000 mL via INTRAVENOUS

## 2020-11-03 MED ORDER — ALBUMIN HUMAN 5 % IV SOLN: INTRAVENOUS | Status: DC | PRN

## 2020-11-03 MED ORDER — PROPOFOL 500 MG/50ML IV EMUL
INTRAVENOUS | Status: DC | PRN
  Administered 2020-11-03: 125 ug/kg/min via INTRAVENOUS

## 2020-11-03 MED ORDER — GADOBUTROL 1 MMOL/ML IV SOLN
10.0000 mL | Freq: Once | INTRAVENOUS | Status: AC | PRN
  Administered 2020-11-03: 10 mL via INTRAVENOUS

## 2020-11-03 MED ORDER — LACTATED RINGERS IV BOLUS
1000.0000 mL | Freq: Once | INTRAVENOUS | Status: AC
  Administered 2020-11-03: 1000 mL via INTRAVENOUS

## 2020-11-03 MED ORDER — PROPOFOL 10 MG/ML IV BOLUS
INTRAVENOUS | Status: DC | PRN
  Administered 2020-11-03: 30 mg via INTRAVENOUS
  Administered 2020-11-03: 20 mg via INTRAVENOUS

## 2020-11-03 MED ORDER — GLUCERNA SHAKE PO LIQD
237.0000 mL | Freq: Three times a day (TID) | ORAL | Status: DC
  Administered 2020-11-03 – 2020-11-09 (×13): 237 mL via ORAL
  Filled 2020-11-03: qty 237

## 2020-11-03 NOTE — Progress Notes (Addendum)
PROGRESS NOTE    Chelsea Mathis  IWL:[id number] DOB: 1990-08-25 DOA: 10/31/2020 PCP: Pcp, No   Brief Narrative: Chelsea Mathis is a 30 y.o. female with a history of diabetes mellitus type 2.  Patient presented secondary to elevated blood sugar, weakness, fatigue with evidence of sepsis.  She started on empiric biotics with blood cultures significant for Staph aureus.  She is also found to have a septic pulmonary emboli on CT imaging concerning for possible endocarditis.   Assessment & Plan:   Principal Problem:   Severe sepsis ([place]) Active Problems:   DKA (diabetic ketoacidosis) ([place])   Bacteremia due to Staphylococcus aureus   Septic pulmonary embolism (HCC)   Type 2 diabetes mellitus with hypoglycemia without coma ([place])   Severe sepsis Present on admission. Patient admitted to ICU, but no need for vasopressor support. Cavitary lung lesions noted on CTA chest. Empirically started on Vancomycin/Clindamycin/Zosyn. Blood culture significant for staphylococcus aureus infection and transitioned to Vancomycin IV monotherapy.  Staphylococcus bacteremia Cavitary lung lesions Infectious disease consulted. Empiric antibiotics as above and now transitioned to Vancomycin IV monotherapy. Repeat chest x-ray slightly worsened, however, symptoms stable. -ID recommendations: Vancomycin, Transesophageal Echocardiogram (cardiology consulted for exam on 8/3)  DKA In setting of infection and recent prednisone use. Patient presented with elevated glucose, anion gap beta-hydroxybutyric acid and low bicarbonate level. Patient started on insulin drip with rapid improvement in anion gap, CO2 and blood sugars. Transitioned off of insulin drip and to Lantus. Resolved.  Tachycardia In setting of acute illness. Fever last night. Sinus rhythm. -Give LR bolus followed by MIVF for today  Diabetes mellitus, type 2 History of diabetes; recently started on steroids. Patient's hemoglobin A1C is 12.3%. -Increase  to Semglee 35 units daily and start Novolog 10 units TID with meals. Switch to TID with meals -Continue carb modified diet -Diabetes coordinator for education  Hyponatremia Severe. Sodium of 103 on admission, although repeat sodium 20 min later of 120. Rapid increase with control of blood sugar. Most likely mainly secondary to pseudohyponatremia from hyperglycemia.  AKI In setting of DKA and likely accompanying dehydration. Resolved with IV fluids.  Atrial fibrillation Appears to be transient. Started on Amiodarone with bolus and drip. Currently in sinus rhythm.  Myalgias Unclear etiology but likely related to overall infection. CK elevated on admission but repeat is normal. Aldolase is elevated. RF is mildly elevated and ANA is negative. Initial imaging without any specific findings.   Addendum: MRI significant for evidence of myositis/myofasciitis. Discussed with Dr. Doreatha Martin, orthopedic surgery who will see patient in AM. -Percocet prn   DVT prophylaxis: SCDs Code Status:   Code Status: Full Code Family Communication: None at bedside Disposition Plan: Discharge likely home in 3-5 days pending continued workup/management of bacteremia, improvement of sepsis, myalgias   Consultants:  PCCM  Procedures:  TRANSTHORACIC ECHOCARDIOGRAM (11/01/2020) IMPRESSIONS     1. Left ventricular ejection fraction, by estimation, is 70 to 75%. The  left ventricle has hyperdynamic function. The left ventricle has no  regional wall motion abnormalities. Indeterminate diastolic filling due to  E-A fusion.   2. Right ventricular systolic function is normal. The right ventricular  size is normal.   3. The mitral valve is normal in structure. No evidence of mitral valve  regurgitation. No evidence of mitral stenosis.   4. The aortic valve is normal in structure. Aortic valve regurgitation is  not visualized. No aortic stenosis is present.   5. The inferior vena cava is normal in size with greater  than 50%  respiratory variability, suggesting right atrial pressure of 3 mmHg.   Conclusion(s)/Recommendation(s): No evidence of valvular vegetations on  this transthoracic echocardiogram. Would recommend a transesophageal  echocardiogram to exclude infective endocarditis if clinically indicated  Antimicrobials: Vancomycin IV Zosyn IV Clindamycin IV    Subjective: Extremity pain has improved a little bit.  Objective: Vitals:   11/02/20 2200 11/03/20 0358 11/03/20 0737 11/03/20 0803  BP:    119/62  Pulse: (!) 105   (!) 113  Resp: 18   (!) 31  Temp:    99 F (37.2 C)  TempSrc:    Oral  SpO2: 98%  99% 97%  Weight:  98 kg    Height:        Intake/Output Summary (Last 24 hours) at 11/03/2020 1210 Last data filed at 11/03/2020 0300 Gross per 24 hour  Intake 621.69 ml  Output 450 ml  Net 171.69 ml    Filed Weights   11/01/20 0500 11/02/20 0418 11/03/20 0358  Weight: 93.8 kg 98 kg 98 kg    Examination:  General exam: Appears calm and comfortable Respiratory system: Clear to auscultation. Respiratory effort normal. Cardiovascular system: S1 & S2 heard, tachycardia, normal rhythm. No murmurs, rubs, gallops or clicks. Gastrointestinal system: Abdomen is nondistended, soft and nontender. No organomegaly or masses felt. Normal bowel sounds heard. Central nervous system: Alert and oriented. No focal neurological deficits. Musculoskeletal: No significant edema noted, although bilateral forearms do appear slightly swollen. Forearms are tender. No calf tenderness Skin: No cyanosis. No rashes Psychiatry: Judgement and insight appear normal. Mood & affect appropriate.     Data Reviewed: I have personally reviewed following labs and imaging studies  CBC Lab Results  Component Value Date   WBC 23.2 (H) 11/03/2020   RBC 2.96 (L) 11/03/2020   HGB 7.5 (L) 11/03/2020   HCT 22.7 (L) 11/03/2020   MCV 76.7 (L) 11/03/2020   MCH 25.3 (L) 11/03/2020   PLT 61 (L) 11/03/2020   MCHC  33.0 11/03/2020   RDW 13.3 11/03/2020   LYMPHSABS 1.7 10/31/2020   MONOABS 0.0 (L) 10/31/2020   EOSABS 0.0 10/31/2020   BASOSABS 0.0 14/43/1540     Last metabolic panel Lab Results  Component Value Date   NA 129 (L) 11/03/2020   K 3.7 11/03/2020   CL 89 (L) 11/03/2020   CO2 27 11/03/2020   BUN 13 11/03/2020   CREATININE 0.60 11/03/2020   GLUCOSE 275 (H) 11/03/2020   GFRNONAA >60 11/03/2020   GFRAA >60 11/10/2016   CALCIUM 8.2 (L) 11/03/2020   PHOS 3.5 11/03/2020   PROT 5.4 (L) 11/01/2020   ALBUMIN 1.5 (L) 11/01/2020   BILITOT 0.9 11/01/2020   ALKPHOS 106 11/01/2020   AST 55 (H) 11/01/2020   ALT 21 11/01/2020   ANIONGAP 13 11/03/2020    CBG (last 3)  Recent Labs    11/02/20 2354 11/03/20 0353 11/03/20 0800  GLUCAP 252* 311* 298*      GFR: Estimated Creatinine Clearance: 114.8 mL/min (by C-G formula based on SCr of 0.6 mg/dL).  Coagulation Profile: Recent Labs  Lab 10/31/20 1109 10/31/20 2056  INR 1.4* 1.3*     Recent Results (from the past 240 hour(s))  Resp Panel by RT-PCR (Flu A&B, Covid) Nasopharyngeal Swab     Status: None   Collection Time: 10/31/20 10:53 AM   Specimen: Nasopharyngeal Swab; Nasopharyngeal(NP) swabs in vial transport medium  Result Value Ref Range Status   SARS Coronavirus 2 by RT PCR NEGATIVE NEGATIVE Final  Comment: (NOTE) SARS-CoV-2 target nucleic acids are NOT DETECTED.  The SARS-CoV-2 RNA is generally detectable in upper respiratory specimens during the acute phase of infection. The lowest concentration of SARS-CoV-2 viral copies this assay can detect is 138 copies/mL. A negative result does not preclude SARS-Cov-2 infection and should not be used as the sole basis for treatment or other patient management decisions. A negative result may occur with  improper specimen collection/handling, submission of specimen other than nasopharyngeal swab, presence of viral mutation(s) within the areas targeted by this assay, and  inadequate number of viral copies(<138 copies/mL). A negative result must be combined with clinical observations, patient history, and epidemiological information. The expected result is Negative.  Fact Sheet for Patients:  EntrepreneurPulse.com.au  Fact Sheet for Healthcare Providers:  IncredibleEmployment.be  This test is no t yet approved or cleared by the [place] FDA and  has been authorized for detection and/or diagnosis of SARS-CoV-2 by FDA under an Emergency Use Authorization (EUA). This EUA will remain  in effect (meaning this test can be used) for the duration of the COVID-19 declaration under Section 564(b)(1) of the Act, 21 U.S.C.section 360bbb-3(b)(1), unless the authorization is terminated  or revoked sooner.       Influenza A by PCR NEGATIVE NEGATIVE Final   Influenza B by PCR NEGATIVE NEGATIVE Final    Comment: (NOTE) The Xpert Xpress SARS-CoV-2/FLU/RSV plus assay is intended as an aid in the diagnosis of influenza from Nasopharyngeal swab specimens and should not be used as a sole basis for treatment. Nasal washings and aspirates are unacceptable for Xpert Xpress SARS-CoV-2/FLU/RSV testing.  Fact Sheet for Patients: EntrepreneurPulse.com.au  Fact Sheet for Healthcare Providers: IncredibleEmployment.be  This test is not yet approved or cleared by the [place] FDA and has been authorized for detection and/or diagnosis of SARS-CoV-2 by FDA under an Emergency Use Authorization (EUA). This EUA will remain in effect (meaning this test can be used) for the duration of the COVID-19 declaration under Section 564(b)(1) of the Act, 21 U.S.C. section 360bbb-3(b)(1), unless the authorization is terminated or revoked.  Performed at [facility], [place] [address] [place], [place] [address]   Blood culture (routine single)     Status: Abnormal   Collection Time: 10/31/20 11:00 AM    Specimen: BLOOD LEFT FOREARM  Result Value Ref Range Status   Specimen Description BLOOD LEFT FOREARM  Final   Special Requests   Final    BOTTLES DRAWN AEROBIC AND ANAEROBIC Blood Culture adequate volume   Culture  Setup Time   Final    GRAM POSITIVE COCCI IN CLUSTERS IN BOTH AEROBIC AND ANAEROBIC BOTTLES Organism ID to follow CRITICAL RESULT CALLED TO, READ BACK BY AND VERIFIED WITH: JJerrilyn Cairo PHARMD, AT 0221 11/01/20 Rush Landmark Performed at [facility], [place] [address], [place], [place] [address]    Culture METHICILLIN RESISTANT STAPHYLOCOCCUS AUREUS (A)  Final   Report Status 11/03/2020 FINAL  Final   Organism ID, Bacteria METHICILLIN RESISTANT STAPHYLOCOCCUS AUREUS  Final      Susceptibility   Methicillin resistant staphylococcus aureus - MIC*    CIPROFLOXACIN >=8 RESISTANT Resistant     ERYTHROMYCIN >=8 RESISTANT Resistant     GENTAMICIN <=0.5 SENSITIVE Sensitive     OXACILLIN >=4 RESISTANT Resistant     TETRACYCLINE <=1 SENSITIVE Sensitive     VANCOMYCIN <=0.5 SENSITIVE Sensitive     TRIMETH/SULFA <=10 SENSITIVE Sensitive     CLINDAMYCIN <=0.25 SENSITIVE Sensitive     RIFAMPIN <=  0.5 SENSITIVE Sensitive     Inducible Clindamycin NEGATIVE Sensitive     * METHICILLIN RESISTANT STAPHYLOCOCCUS AUREUS  Blood Culture ID Panel (Reflexed)     Status: Abnormal   Collection Time: 10/31/20 11:00 AM  Result Value Ref Range Status   Enterococcus faecalis NOT DETECTED NOT DETECTED Final   Enterococcus Faecium NOT DETECTED NOT DETECTED Final   Listeria monocytogenes NOT DETECTED NOT DETECTED Final   Staphylococcus species DETECTED (A) NOT DETECTED Final    Comment: CRITICAL RESULT CALLED TO, READ BACK BY AND VERIFIED WITH: J. LEDFORD PHARMD, AT 0221 11/01/20 D. VANHOOK    Staphylococcus aureus (BCID) DETECTED (A) NOT DETECTED Final    Comment: Methicillin (oxacillin)-resistant Staphylococcus aureus (MRSA). MRSA is predictably resistant to beta-lactam antibiotics (except ceftaroline).  Preferred therapy is vancomycin unless clinically contraindicated. Patient requires contact precautions if  hospitalized. CRITICAL RESULT CALLED TO, READ BACK BY AND VERIFIED WITH: J. LEDFORD PHARMD, AT 0221 11/01/20 D. VANHOOK    Staphylococcus epidermidis NOT DETECTED NOT DETECTED Final   Staphylococcus lugdunensis NOT DETECTED NOT DETECTED Final   Streptococcus species NOT DETECTED NOT DETECTED Final   Streptococcus agalactiae NOT DETECTED NOT DETECTED Final   Streptococcus pneumoniae NOT DETECTED NOT DETECTED Final   Streptococcus pyogenes NOT DETECTED NOT DETECTED Final   A.calcoaceticus-baumannii NOT DETECTED NOT DETECTED Final   Bacteroides fragilis NOT DETECTED NOT DETECTED Final   Enterobacterales NOT DETECTED NOT DETECTED Final   Enterobacter cloacae complex NOT DETECTED NOT DETECTED Final   Escherichia coli NOT DETECTED NOT DETECTED Final   Klebsiella aerogenes NOT DETECTED NOT DETECTED Final   Klebsiella oxytoca NOT DETECTED NOT DETECTED Final   Klebsiella pneumoniae NOT DETECTED NOT DETECTED Final   Proteus species NOT DETECTED NOT DETECTED Final   Salmonella species NOT DETECTED NOT DETECTED Final   Serratia marcescens NOT DETECTED NOT DETECTED Final   Haemophilus influenzae NOT DETECTED NOT DETECTED Final   Neisseria meningitidis NOT DETECTED NOT DETECTED Final   Pseudomonas aeruginosa NOT DETECTED NOT DETECTED Final   Stenotrophomonas maltophilia NOT DETECTED NOT DETECTED Final   Candida albicans NOT DETECTED NOT DETECTED Final   Candida auris NOT DETECTED NOT DETECTED Final   Candida glabrata NOT DETECTED NOT DETECTED Final   Candida krusei NOT DETECTED NOT DETECTED Final   Candida parapsilosis NOT DETECTED NOT DETECTED Final   Candida tropicalis NOT DETECTED NOT DETECTED Final   Cryptococcus neoformans/gattii NOT DETECTED NOT DETECTED Final   Meth resistant mecA/C and MREJ DETECTED (A) NOT DETECTED Final    Comment: CRITICAL RESULT CALLED TO, READ BACK BY AND  VERIFIED WITH: J. LEDFORD PHARMD, AT 0221 11/01/20 Rush Landmark Performed at [facility], [address], [place], [place] [address]   Urine Culture     Status: Abnormal   Collection Time: 10/31/20  1:29 PM   Specimen: In/Out Cath Urine  Result Value Ref Range Status   Specimen Description IN/OUT CATH URINE  Final   Special Requests   Final    NONE Performed at [facility], [place] [address] [place],  [address]    Culture MULTIPLE SPECIES PRESENT, SUGGEST RECOLLECTION (A)  Final   Report Status 11/01/2020 FINAL  Final  MRSA Next Gen by PCR, Nasal     Status: Abnormal   Collection Time: 10/31/20  7:50 PM   Specimen: Nasal Mucosa; Nasal Swab  Result Value Ref Range Status   MRSA by PCR Next Gen DETECTED (A) NOT DETECTED Final    Comment: RESULT CALLED  TO, READ BACK BY AND VERIFIED WITH: WARNER,B RN AT 2144 10/31/2020 MITCHELL,L (NOTE) The GeneXpert MRSA Assay (FDA approved for NASAL specimens only), is one component of a comprehensive MRSA colonization surveillance program. It is not intended to diagnose MRSA infection nor to guide or monitor treatment for MRSA infections. Test performance is not FDA approved in patients less than 65 years old. Performed at [facility], [place] [address] [place], [place] [address]   Culture, blood (routine x 2)     Status: None (Preliminary result)   Collection Time: 11/01/20 10:22 AM   Specimen: BLOOD RIGHT FOREARM  Result Value Ref Range Status   Specimen Description BLOOD RIGHT FOREARM  Final   Special Requests   Final    BOTTLES DRAWN AEROBIC AND ANAEROBIC Blood Culture adequate volume   Culture   Final    NO GROWTH 2 DAYS Performed at [facility], [place] [address] [place], [place] [address]    Report Status PENDING  Incomplete  Culture, blood (routine x 2)     Status: None (Preliminary result)   Collection Time: 11/01/20 10:29 AM   Specimen: BLOOD RIGHT HAND  Result Value Ref Range Status   Specimen  Description BLOOD RIGHT HAND  Final   Special Requests   Final    BOTTLES DRAWN AEROBIC AND ANAEROBIC Blood Culture adequate volume   Culture   Final    NO GROWTH 2 DAYS Performed at [facility], [place] [address] [place], [place] [address]    Report Status PENDING  Incomplete         Radiology Studies: DG CHEST PORT 1 VIEW  Result Date: 11/02/2020 CLINICAL DATA:  Hypoxia EXAM: PORTABLE CHEST 1 VIEW COMPARISON:  10/31/2020 FINDINGS: Borderline to mild cardiomegaly. Worsening airspace disease bilaterally consistent with pneumonia. Possible small right effusion. No pneumothorax IMPRESSION: Worsening bilateral opacities concerning for pneumonia Electronically Signed   By: Donavan Foil M.D.   On: 11/02/2020 20:21        Scheduled Meds:  Chlorhexidine Gluconate Cloth  6 each Topical Daily   feeding supplement (GLUCERNA SHAKE)  237 mL Oral TID BM   insulin aspart  0-15 Units Subcutaneous TID WC   insulin aspart  10 Units Subcutaneous TID WC   insulin glargine-yfgn  35 Units Subcutaneous Daily   insulin starter kit- pen needles  1 kit Other Once   living well with diabetes book   Does not apply Once   multivitamin with minerals  1 tablet Oral Daily   mupirocin ointment   Nasal BID   Continuous Infusions:  sodium chloride 20 mL/hr at 11/03/20 3716   lactated ringers     Followed by   lactated ringers     vancomycin 1,250 mg (11/02/20 2116)     LOS: 3 days     Cordelia Poche, MD [facility] 11/03/2020, 12:10 PM  If 7PM-7AM, please contact night-coverage [email or web address]

## 2020-11-03 NOTE — Plan of Care (Signed)

## 2020-11-03 NOTE — Anesthesia Postprocedure Evaluation (Signed)
Anesthesia Post Note  Patient: Chelsea Mathis  Procedure(s) Performed: TRANSESOPHAGEAL ECHOCARDIOGRAM (TEE)     Patient location during evaluation: Endoscopy Anesthesia Type: MAC Level of consciousness: awake and alert Pain management: pain level controlled Vital Signs Assessment: post-procedure vital signs reviewed and stable Respiratory status: spontaneous breathing, nonlabored ventilation, respiratory function stable and patient connected to nasal cannula oxygen Cardiovascular status: blood pressure returned to baseline and stable Postop Assessment: no apparent nausea or vomiting Anesthetic complications: no   No notable events documented.  Last Vitals:  Vitals:   11/03/20 1623 11/03/20 1654  BP:    Pulse: (!) 120 (!) 112  Resp: (!) 24 18  Temp:    SpO2:      Last Pain:  Vitals:   11/03/20 1552  TempSrc: Oral  PainSc: 9                  Ahamed Hofland L Gerardo Territo

## 2020-11-03 NOTE — Progress Notes (Signed)
OT Cancellation Note  Patient Details Name: Sadiya Durand MRN: [id number] DOB: 02/18/1991   Cancelled Treatment:    Reason Eval/Treat Not Completed: Patient at procedure or test/ unavailable. Patient off the floor in MRI and then for TEE. Will f/u as able.  Detric Scalisi L Jarae Panas 11/03/2020, 1:51 PM

## 2020-11-03 NOTE — Progress Notes (Signed)
  Echocardiogram TEE has been performed.  Roosvelt Maser F 11/03/2020, 3:22 PM

## 2020-11-03 NOTE — Progress Notes (Signed)
[facility] Infectious Diseases Follow Up Note  Patient Identification: Patient Name: Chelsea Mathis MRN: [id number] Admit Date: 10/31/2020  9:34 AM Age: 30 y.o.Today's Date: 11/03/2020   Reason for Visit: MRSA bacteremia  Principal Problem:   Severe sepsis (HCC) Active Problems:   DKA (diabetic ketoacidosis) (HCC)   Bacteremia due to Staphylococcus aureus   Septic pulmonary embolism (HCC)   Type 2 diabetes mellitus with hypoglycemia without coma (HCC)  Antibiotics: Vancomycin-c                    clindamycin/zosyn 8/2    Lines/Tubes: PIVs    Interval Events:   1 episode of fever at 101.3. WBC uptrending to 23.2.  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  2.   Follow-up repeat blood cultures 8/2 3.   TTE is negative for endocarditis.  TEE is planned for today.  4.   Follow-up MRI left femur and right femur 5.   Discussed with primary and ID pharmacy 6.   Monitor CBC, BMP and Vancomycin trough  7.   Plan discussed with patient/ID pharmacy and primary   Following   Rest of the management as per the primary team. Thank you for the consult. Please page with pertinent questions or concerns.  ______________________________________________________________________ Subjective patient seen and examined at the bedside. She looks clinically better than yesterday and pain in the left thigh/rt  thigh and rt hip appears to be stable. Still feells malaise and generalised body pain. Fiance at bedside.    Vitals BP 112/66   Pulse (!) 105   Temp 98 F (36.7 C)   Resp 18   Ht 5\' 4"  (1.626 m)   Wt 98 kg   LMP 10/31/2020   SpO2 99%   BMI 37.09 kg/m     Physical Exam Constitutional: not in acute distress, clinically looks non toxic today     Comments:   Cardiovascular:     Rate and Rhythm: Normal rate and regular rhythm.     Heart sounds:   Pulmonary:     Effort: Pulmonary effort is normal.     Comments:   Abdominal:     Palpations: Abdomen is soft.     Tenderness: Nontender and nondistended  Musculoskeletal:        General: Tenderness in the right thigh, no fluctuance or crepitus palpated.                    Left thigh tenderness                   ROM of rt and left hip better than yesterday, able to ben her bilateral knees and hips Skin:    Comments: No lesions or rashes   Neurological:     General: No focal deficit present.   Psychiatric:        Mood and Affect: Mood normal.    Pertinent Microbiology Results for orders placed or performed during the hospital encounter of 10/31/20  Resp Panel by  RT-PCR (Flu A&B, Covid) Nasopharyngeal Swab     Status: None   Collection Time: 10/31/20 10:53 AM   Specimen: Nasopharyngeal Swab; Nasopharyngeal(NP) swabs in vial transport medium  Result Value Ref Range Status   SARS Coronavirus 2 by RT PCR NEGATIVE NEGATIVE Final    Comment: (NOTE) SARS-CoV-2 target nucleic acids are NOT DETECTED.  The SARS-CoV-2 RNA is generally detectable in upper respiratory specimens during the acute phase of infection. The lowest concentration of SARS-CoV-2 viral copies this assay can detect is 138 copies/mL. A negative result does not preclude SARS-Cov-2 infection and should not be used as the sole basis for treatment or other patient management decisions. A negative result may occur with  improper specimen collection/handling,  submission of specimen other than nasopharyngeal swab, presence of viral mutation(s) within the areas targeted by this assay, and inadequate number of viral copies(<138 copies/mL). A negative result must be combined with clinical observations, patient history, and epidemiological information. The expected result is Negative.  Fact Sheet for Patients:  BloggerCourse.com  Fact Sheet for Healthcare Providers:  SeriousBroker.it  This test is no t yet approved or cleared by the [place] FDA and  has been authorized for detection and/or diagnosis of SARS-CoV-2 by FDA under an Emergency Use Authorization (EUA). This EUA will remain  in effect (meaning this test can be used) for the duration of the COVID-19 declaration under Section 564(b)(1) of the Act, 21 U.S.C.section 360bbb-3(b)(1), unless the authorization is terminated  or revoked sooner.       Influenza A by PCR NEGATIVE NEGATIVE Final   Influenza B by PCR NEGATIVE NEGATIVE Final    Comment: (NOTE) The Xpert Xpress SARS-CoV-2/FLU/RSV plus assay is intended as an aid in the diagnosis of influenza from Nasopharyngeal swab specimens and should not be used as a sole basis for treatment. Nasal washings and aspirates are unacceptable for Xpert Xpress SARS-CoV-2/FLU/RSV testing.  Fact Sheet for Patients: BloggerCourse.com  Fact Sheet for Healthcare Providers: SeriousBroker.it  This test is not yet approved or cleared by the [place] FDA and has been authorized for detection and/or diagnosis of SARS-CoV-2 by FDA under an Emergency Use Authorization (EUA). This EUA will remain in effect (meaning this test can be used) for the duration of the COVID-19 declaration under Section 564(b)(1) of the Act, 21 U.S.C. section 360bbb-3(b)(1), unless the authorization is terminated or revoked.  Performed at [facility], [address], [place], [place] [address]   Blood culture (routine single)     Status: Abnormal (Preliminary result)   Collection Time: 10/31/20 11:00 AM   Specimen: BLOOD LEFT FOREARM  Result Value Ref Range Status   Specimen Description BLOOD LEFT FOREARM  Final   Special Requests   Final    BOTTLES DRAWN AEROBIC AND ANAEROBIC Blood Culture adequate volume   Culture  Setup Time   Final    GRAM POSITIVE COCCI IN CLUSTERS IN BOTH AEROBIC AND ANAEROBIC BOTTLES Organism ID to follow CRITICAL RESULT CALLED TO, READ BACK BY AND VERIFIED WITH: J. LEDFORD PHARMD, AT 0221 11/01/20 D. VANHOOK    Culture (A)  Final    STAPHYLOCOCCUS AUREUS SUSCEPTIBILITIES TO FOLLOW Performed at [facility], [address], [place], [place] [address]    Report Status PENDING  Incomplete  Blood Culture ID Panel (Reflexed)     Status: Abnormal   Collection Time: 10/31/20 11:00 AM  Result Value Ref Range Status   Enterococcus faecalis NOT DETECTED NOT DETECTED Final   Enterococcus  Faecium NOT DETECTED NOT DETECTED Final   Listeria monocytogenes NOT DETECTED NOT DETECTED Final   Staphylococcus species DETECTED (A) NOT DETECTED Final    Comment: CRITICAL RESULT CALLED TO, READ BACK BY AND VERIFIED WITH: J. LEDFORD PHARMD, AT 0221 11/01/20 D. VANHOOK    Staphylococcus aureus (BCID) DETECTED (A) NOT DETECTED Final    Comment: Methicillin (oxacillin)-resistant Staphylococcus aureus (MRSA). MRSA is predictably resistant to beta-lactam antibiotics (except ceftaroline). Preferred therapy is vancomycin unless clinically contraindicated. Patient requires contact precautions if  hospitalized. CRITICAL RESULT CALLED TO, READ BACK BY AND VERIFIED WITH: J. LEDFORD PHARMD, AT 0221 11/01/20 D. VANHOOK    Staphylococcus epidermidis NOT DETECTED NOT DETECTED Final   Staphylococcus lugdunensis NOT DETECTED NOT DETECTED Final   Streptococcus species NOT DETECTED NOT DETECTED Final   Streptococcus agalactiae NOT DETECTED NOT  DETECTED Final   Streptococcus pneumoniae NOT DETECTED NOT DETECTED Final   Streptococcus pyogenes NOT DETECTED NOT DETECTED Final   A.calcoaceticus-baumannii NOT DETECTED NOT DETECTED Final   Bacteroides fragilis NOT DETECTED NOT DETECTED Final   Enterobacterales NOT DETECTED NOT DETECTED Final   Enterobacter cloacae complex NOT DETECTED NOT DETECTED Final   Escherichia coli NOT DETECTED NOT DETECTED Final   Klebsiella aerogenes NOT DETECTED NOT DETECTED Final   Klebsiella oxytoca NOT DETECTED NOT DETECTED Final   Klebsiella pneumoniae NOT DETECTED NOT DETECTED Final   Proteus species NOT DETECTED NOT DETECTED Final   Salmonella species NOT DETECTED NOT DETECTED Final   Serratia marcescens NOT DETECTED NOT DETECTED Final   Haemophilus influenzae NOT DETECTED NOT DETECTED Final   Neisseria meningitidis NOT DETECTED NOT DETECTED Final   Pseudomonas aeruginosa NOT DETECTED NOT DETECTED Final   Stenotrophomonas maltophilia NOT DETECTED NOT DETECTED Final   Candida albicans NOT DETECTED NOT DETECTED Final   Candida auris NOT DETECTED NOT DETECTED Final   Candida glabrata NOT DETECTED NOT DETECTED Final   Candida krusei NOT DETECTED NOT DETECTED Final   Candida parapsilosis NOT DETECTED NOT DETECTED Final   Candida tropicalis NOT DETECTED NOT DETECTED Final   Cryptococcus neoformans/gattii NOT DETECTED NOT DETECTED Final   Meth resistant mecA/C and MREJ DETECTED (A) NOT DETECTED Final    Comment: CRITICAL RESULT CALLED TO, READ BACK BY AND VERIFIED WITH: J. LEDFORD PHARMD, AT 0221 11/01/20 Renato Shin Performed at [facility], [address], [place], [place] [address]   Urine Culture     Status: Abnormal   Collection Time: 10/31/20  1:29 PM   Specimen: In/Out Cath Urine  Result Value Ref Range Status   Specimen Description IN/OUT CATH URINE  Final   Special Requests   Final    NONE Performed at [facility], [address] [place], [place] [address]    Culture MULTIPLE  SPECIES PRESENT, SUGGEST RECOLLECTION (A)  Final   Report Status 11/01/2020 FINAL  Final  MRSA Next Gen by PCR, Nasal     Status: Abnormal   Collection Time: 10/31/20  7:50 PM   Specimen: Nasal Mucosa; Nasal Swab  Result Value Ref Range Status   MRSA by PCR Next Gen DETECTED (A) NOT DETECTED Final    Comment: RESULT CALLED TO, READ BACK BY AND VERIFIED WITH: WARNER,B RN AT 2144 10/31/2020 MITCHELL,L (NOTE) The GeneXpert MRSA Assay (FDA approved for NASAL specimens only), is one component of a comprehensive MRSA colonization surveillance program. It is not intended to diagnose MRSA infection nor to guide or monitor treatment for MRSA infections. Test performance is not FDA approved  in patients less than 52 years old. Performed at [facility], [address] [place], [place] [address]   Culture, blood (routine x 2)     Status: None (Preliminary result)   Collection Time: 11/01/20 10:22 AM   Specimen: BLOOD RIGHT FOREARM  Result Value Ref Range Status   Specimen Description BLOOD RIGHT FOREARM  Final   Special Requests   Final    BOTTLES DRAWN AEROBIC AND ANAEROBIC Blood Culture adequate volume   Culture   Final    NO GROWTH < 24 HOURS Performed at [facility], [address], [place], [place] [address]    Report Status PENDING  Incomplete  Culture, blood (routine x 2)     Status: None (Preliminary result)   Collection Time: 11/01/20 10:29 AM   Specimen: BLOOD RIGHT HAND  Result Value Ref Range Status   Specimen Description BLOOD RIGHT HAND  Final   Special Requests   Final    BOTTLES DRAWN AEROBIC AND ANAEROBIC Blood Culture adequate volume   Culture   Final    NO GROWTH < 24 HOURS Performed at [facility], [address], [place], [place] [address]    Report Status PENDING  Incomplete    Pertinent Lab. CBC Latest Ref Rng & Units 11/03/2020 11/02/2020 11/02/2020  WBC 4.0 - 10.5 K/uL 23.2(H) 20.6(H) 18.3(H)  Hemoglobin 12.0 - 15.0 g/dL 7.5(L) 8.3(L) 7.9(L)   Hematocrit 36.0 - 46.0 % 22.7(L) 24.4(L) 23.0(L)  Platelets 150 - 400 K/uL 61(L) 56(L) 53(L)   CMP Latest Ref Rng & Units 11/03/2020 11/02/2020 11/01/2020  Glucose 70 - 99 mg/dL 938(B) 017(P) 102(H)  BUN 6 - 20 mg/dL 13 19 20   Creatinine 0.44 - 1.00 mg/dL 8.52 7.78  Sodium 135 - 145 mmol/L 129(L) 127(L) 132(L)  Potassium 3.5 - 5.1 mmol/L 3.7 3.6 3.5  Chloride 98 - 111 mmol/L 89(L) 91(L) 98  CO2 22 - 32 mmol/L 27 27 24   Calcium 8.9 - 10.3 mg/dL 8.2(L) 8.1(L) 8.1(L)  Total Protein 6.5 - 8.1 g/dL - - 5.4(L)  Total Bilirubin 0.3 - 1.2 mg/dL - - 0.9  Alkaline Phos 38 - 126 U/L - - 106  AST 15 - 41 U/L - - 55(H)  ALT 0 - 44 U/L - - 21     Pertinent Imaging today Plain films and CT images have been personally visualized and interpreted; radiology reports have been reviewed. Decision making incorporated into the Impression / Recommendations.  I spent more than 35 minutes for this patient encounter including review of prior medical records, coordination of care  with greater than 50% of time being face to face/counseling and discussing diagnostics/treatment plan with the patient/family.  Electronically signed by:   2.42, MD Infectious Disease Physician [facility] Pager: [phone]

## 2020-11-03 NOTE — CV Procedure (Signed)
    TRANSESOPHAGEAL ECHOCARDIOGRAM   NAME:  Bennett Vanscyoc   MRN: [id number] DOB:  1990/08/01   ADMIT DATE: 10/31/2020  INDICATIONS: Staph bacteremia  PROCEDURE:   Informed consent was obtained prior to the procedure. The risks, benefits and alternatives for the procedure were discussed and the patient comprehended these risks.  Risks include, but are not limited to, cough, sore throat, vomiting, nausea, somnolence, esophageal and stomach trauma or perforation, bleeding, low blood pressure, aspiration, pneumonia, infection, trauma to the teeth and death.    Procedural time out performed. The oropharynx was anesthetized with topical 1% cetacaine.    Patient received monitored anesthesia care under the supervision of Dr. Armond Hang. Patient received a total of 356 mg propofol during the procedure.  The transesophageal probe was inserted in the esophagus and stomach without difficulty and multiple views were obtained.    COMPLICATIONS:    There were no immediate complications.  FINDINGS:  LEFT VENTRICLE: EF is hyperdynamic. No regional wall motion abnormalities.  RIGHT VENTRICLE: Normal size and function.   LEFT ATRIUM: No thrombus/mass.  LEFT ATRIAL APPENDAGE: No thrombus/mass.   RIGHT ATRIUM: No thrombus/mass.  AORTIC VALVE:  Trileaflet. No regurgitation. No vegetation.  MITRAL VALVE:    Normal structure. No significant regurgitation. No vegetation.  TRICUSPID VALVE: Abnormal valve suggestive of endocarditis. There is no discrete mass seen attached to the valve, but the valve is overall thickened, and there are thickened/echogenic areas of the subvalvular apparatus. Trivial TR.  PULMONIC VALVE: Grossly normal structure. Trivial regurgitation. No apparent vegetation.  INTERATRIAL SEPTUM: No PFO or ASD seen by color Doppler.  PERICARDIUM: Trivialeffusion noted.  DESCENDING AORTA: No plaque seen   CONCLUSION: Suggestive of tricuspid valve endocarditis. While no discrete  echodensity is seen on the atrial surface of the valve, the overall thickening and appearance of the valve and subvalvular apparatus supports infection. Cannot exclude that tricuspid valve had vegetation at one time resulting in septic emboli.    Jodelle Red, MD, PhD [facility]  [address], [address] [place], [place] [address] [phone]   2:54 PM

## 2020-11-03 NOTE — Progress Notes (Addendum)
Initial Nutrition Assessment  DOCUMENTATION CODES:   Obesity unspecified  INTERVENTION:  -Glucerna Shake po TID, each supplement provides 220 kcal and 10 grams of protein -MVI with minerals daily -Provided education on diabetes diet   NUTRITION DIAGNOSIS:   Inadequate oral intake related to decreased appetite as evidenced by meal completion < 50%.  GOAL:   Patient will meet greater than or equal to 90% of their needs  MONITOR:   PO intake, Supplement acceptance, Labs, Weight trends, I & O's  REASON FOR ASSESSMENT:   Consult Diet education  ASSESSMENT:   Pt with a PMH significant for type 2 DM admitted with DKA (now resolved) and severe sepsis 2/2 staphylococcus bacteremia and cavitary lung lesions  Infectious Disease recommending IV vancomycin and TEE.  Pt not in room at time of RD visit. Discussed pt with RN. Per RN, pt has not had the best intake since admit. 2 meals have been recorded since admit with 25-50% meal completion indicated. Will provide pt with educational materials on lowering blood glucose levels and outpatient education consult so pt has continued support after discharge. Will also provide pt with oral nutrition supplements to improve kcal/protein intake.   No weight changes noted per available weight readings (see below). Pt actually with 8% weight gain x1 month, though this is likely inaccurate as pt currently noted to have generalized moderate pitting edema (BUE and BLE). Recommend daily weights.   PO intake: 25-50% x 2 recorded meals   Medications: SSI, novolog, semglee Labs: Na 129 (L) CBGs 252-311-298 Hemoglobin A1c (taken 10/31/20): 12.3  UOP: x24 hours I/O: +3.8L since admit  NUTRITION - FOCUSED PHYSICAL EXAM: Unable to perform at this time as pt is unavailable. Will attempt at follow-up.   Diet Order:   Diet Order             Diet NPO time specified  Diet effective midnight                   EDUCATION NEEDS:   Education  needs have been addressed  Skin:  Skin Assessment: Reviewed RN Assessment  Last BM:  PTA  Height:   Ht Readings from Last 1 Encounters:  10/31/20 5\' 4"  (1.626 m)    Weight:   Wt Readings from Last 1 Encounters:  11/03/20 98 kg    Ideal Body Weight:  54.55 kg  BMI:  Body mass index is 37.09 kg/m.  Estimated Nutritional Needs:   Kcal:  1700-1900  Protein:  100-110 grams  Fluid:  >1.7L    01/03/21, MS, RD, LDN (she/her/hers) RD pager number and weekend/on-call pager number located in Amion.

## 2020-11-03 NOTE — Anesthesia Procedure Notes (Signed)
Procedure Name: MAC Date/Time: 11/03/2020 2:27 PM Performed by: Kathryne Hitch, CRNA Pre-anesthesia Checklist: Patient identified, Suction available, Emergency Drugs available and Patient being monitored Patient Re-evaluated:Patient Re-evaluated prior to induction Oxygen Delivery Method: Nasal cannula Preoxygenation: Pre-oxygenation with 100% oxygen Induction Type: IV induction Dental Injury: Teeth and Oropharynx as per pre-operative assessment

## 2020-11-03 NOTE — Progress Notes (Signed)
Got informed from the Endo staff that patient went for TEE from MRI. Patient has not been back to room after she left for MRI this morning.

## 2020-11-03 NOTE — Progress Notes (Signed)
Inpatient Diabetes Program Recommendations  AACE/ADA: New Consensus Statement on Inpatient Glycemic Control (2015)  Target Ranges:  Prepandial:   less than 140 mg/dL      Peak postprandial:   less than 180 mg/dL (1-2 hours)      Critically ill patients:  140 - 180 mg/dL   Lab Results  Component Value Date   GLUCAP 262 (H) 11/03/2020   HGBA1C 12.3 (H) 10/31/2020    Review of Glycemic Control  CBGs 311, 298, 262  Orders: Semglee increased to 35 units QD Novolog 0-15 units TID + 10 units TID with meals.   Spoke with pt and SO at bedside. Pt had long day with testing. Educated patient on insulin pen use at home. Reviewed contents of insulin flexpen starter kit. Reviewed all steps if insulin pen including attachment of needle, 2-unit air shot, dialing up dose, giving injection, removing needle, disposal of sharps, storage of unused insulin, disposal of insulin etc. Patient able to provide successful return demonstration. Also reviewed troubleshooting with insulin pen. MD to give patient Rxs for insulin pens and insulin pen needles.   Answered questions. Appreciative of visit.  Continue to follow.   Thank you. Lorenda Peck, RD, LDN, CDE Inpatient Diabetes Coordinator [phone]

## 2020-11-03 NOTE — Interval H&P Note (Signed)
History and Physical Interval Note:  11/03/2020 1:50 PM  Chelsea Mathis  has presented today for surgery, with the diagnosis of BACTERIMIA.  The various methods of treatment have been discussed with the patient and family. After consideration of risks, benefits and other options for treatment, the patient has consented to  Procedure(s): TRANSESOPHAGEAL ECHOCARDIOGRAM (TEE) (N/A) as a surgical intervention.  The patient's history has been reviewed, patient examined, no change in status, stable for surgery.  I have reviewed the patient's chart and labs.  Questions were answered to the patient's satisfaction.     Giovany Cosby Cristal Deer

## 2020-11-03 NOTE — Anesthesia Preprocedure Evaluation (Addendum)
Anesthesia Evaluation  Patient identified by MRN, date of birth, ID band Patient awake    Reviewed: Allergy & Precautions, NPO status , Patient's Chart, lab work & pertinent test results  Airway Mallampati: III  TM Distance: >3 FB Neck ROM: Full  Mouth opening: Limited Mouth Opening  Dental no notable dental hx. (+) Teeth Intact, Dental Advisory Given   Pulmonary neg pulmonary ROS,    Pulmonary exam normal breath sounds clear to auscultation       Cardiovascular negative cardio ROS Normal cardiovascular exam Rhythm:Regular Rate:Normal  TTE 2022 1. Left ventricular ejection fraction, by estimation, is 70 to 75%. The  left ventricle has hyperdynamic function. The left ventricle has no  regional wall motion abnormalities. Indeterminate diastolic filling due to  E-A fusion.  2. Right ventricular systolic function is normal. The right ventricular  size is normal.  3. The mitral valve is normal in structure. No evidence of mitral valve  regurgitation. No evidence of mitral stenosis.  4. The aortic valve is normal in structure. Aortic valve regurgitation is  not visualized. No aortic stenosis is present.  5. The inferior vena cava is normal in size with greater than 50%  respiratory variability, suggesting right atrial pressure of 3 mmHg.    Neuro/Psych negative neurological ROS  negative psych ROS   GI/Hepatic negative GI ROS, Neg liver ROS,   Endo/Other  diabetes, Poorly Controlled, Type 2Hyponatremia Na 129  Renal/GU negative Renal ROS  negative genitourinary   Musculoskeletal negative musculoskeletal ROS (+)   Abdominal   Peds  Hematology  (+) Blood dyscrasia (Hgb 7.5, plt 61), anemia ,   Anesthesia Other Findings TEE for bacteremia, sepsis;  Constellation of findings (cavitary lung lesions, sepsis features, diffuse myalgias/ MAHA / lactic acidemia/ hyponatermia) most c/w bacteremia.    Reproductive/Obstetrics                            Anesthesia Physical Anesthesia Plan  ASA: 3  Anesthesia Plan: MAC   Post-op Pain Management:    Induction: Intravenous  PONV Risk Score and Plan: Propofol infusion and Treatment may vary due to age or medical condition  Airway Management Planned: Natural Airway  Additional Equipment:   Intra-op Plan:   Post-operative Plan:   Informed Consent: I have reviewed the patients History and Physical, chart, labs and discussed the procedure including the risks, benefits and alternatives for the proposed anesthesia with the patient or authorized representative who has indicated his/her understanding and acceptance.     Dental advisory given  Plan Discussed with: CRNA  Anesthesia Plan Comments:         Anesthesia Quick Evaluation

## 2020-11-03 NOTE — Transfer of Care (Signed)
Immediate Anesthesia Transfer of Care Note  Patient: Chelsea Mathis  Procedure(s) Performed: TRANSESOPHAGEAL ECHOCARDIOGRAM (TEE)  Patient Location: Endoscopy Unit  Anesthesia Type:MAC  Level of Consciousness: drowsy and patient cooperative  Airway & Oxygen Therapy: Patient Spontanous Breathing and Patient connected to nasal cannula oxygen  Post-op Assessment: Report given to RN and Post -op Vital signs reviewed and stable  Post vital signs: Reviewed and stable  Last Vitals:  Vitals Value Taken Time  BP 115/62   Temp    Pulse 115 11/03/20 1458  Resp 55 11/03/20 1458  SpO2 93 % 11/03/20 1458  Vitals shown include unvalidated device data.  Last Pain:  Vitals:   11/03/20 1336  TempSrc: Oral  PainSc: 7       Patients Stated Pain Goal: 3 (16/60/60 0459)  Complications: No notable events documented.

## 2020-11-03 NOTE — Progress Notes (Signed)
Patient back to her room from MRI and TEE. Patient is A&O*3. CO pain of right flank. Oxygen at 3L [place]. Cardiac monitor applied. Bed kept in low position and locked. All needs met at this time.

## 2020-11-03 NOTE — Discharge Instructions (Signed)
Outpatient Diabetes Education  You have been referred to [facility] for outpatient diabetes education. A referral has been sent and the office will contact you for an appointment. For additional questions or to schedule an appointment, call [phone].    Carbohydrate Counting For People With Diabetes  Foods with carbohydrates make your blood glucose level go up. Learning how to count carbohydrates can help you control your blood glucose levels. First, identify the foods you eat that contain carbohydrates. Then, using the Foods with Carbohydrates chart, determine about how much carbohydrates are in your meals and snacks. Make sure you are eating foods with fiber, protein, and healthy fat along with your carbohydrate foods. Foods with Carbohydrates The following table shows carbohydrate foods that have about 15 grams of carbohydrate each. Using measuring cups, spoons, or a food scale when you first begin learning about carbohydrate counting can help you learn about the portion sizes you typically eat. The following foods have 15 grams carbohydrate each:  Grains . 1 slice bread (1 ounce)  . 1 small tortilla (6-inch size)  .  large bagel (1 ounce)  . 1/3 cup pasta or rice (cooked)  .  hamburger or hot dog bun ( ounce)  .  cup cooked cereal  .  to  cup ready-to-eat cereal  . 2 taco shells (5-inch size) Fruit . 1 small fresh fruit ( to 1 cup)  .  medium banana  . 17 small grapes (3 ounces)  . 1 cup melon or berries  .  cup canned or frozen fruit  . 2 tablespoons dried fruit (blueberries, cherries, cranberries, raisins)  .  cup unsweetened fruit juice  Starchy Vegetables .  cup cooked beans, peas, corn, potatoes/sweet potatoes  .  large baked potato (3 ounces)  . 1 cup acorn or butternut squash  Snack Foods . 3 to 6 crackers  . 8 potato chips or 13 tortilla chips ( ounce to 1 ounce)  . 3 cups popped popcorn  Dairy . 3/4 cup (6  ounces) nonfat plain yogurt, or yogurt with sugar-free sweetener  . 1 cup milk  . 1 cup plain rice, soy, coconut or flavored almond milk Sweets and Desserts .  cup ice cream or frozen yogurt  . 1 tablespoon jam, jelly, pancake syrup, table sugar, or honey  . 2 tablespoons light pancake syrup  . 1 inch square of frosted cake or 2 inch square of unfrosted cake  . 2 small cookies (2/3 ounce each) or  large cookie  Sometimes you'll have to estimate carbohydrate amounts if you don't know the exact recipe. One cup of mixed foods like soups can have 1 to 2 carbohydrate servings, while some casseroles might have 2 or more servings of carbohydrate. Foods that have less than 20 calories in each serving can be counted as "free" foods. Count 1 cup raw vegetables, or  cup cooked non-starchy vegetables as "free" foods. If you eat 3 or more servings at one meal, then count them as 1 carbohydrate serving.  Foods without Carbohydrates  Not all foods contain carbohydrates. Meat, some dairy, fats, non-starchy vegetables, and many beverages don't contain carbohydrate. So when you count carbohydrates, you can generally exclude chicken, pork, beef, fish, seafood, eggs, tofu, cheese, butter, sour cream, avocado, nuts, seeds, olives, mayonnaise, water, black coffee, unsweetened tea, and zero-calorie drinks. Vegetables with no or low carbohydrate include green beans, cauliflower, tomatoes, and onions. How much carbohydrate should I eat at each meal?  Carbohydrate   counting can help you plan your meals and manage your weight. Following are some starting points for carbohydrate intake at each meal. Work with your registered dietitian nutritionist to find the best range that works for your blood glucose and weight.   To Lose Weight To Maintain Weight  Women 2 - 3 carb servings 3 - 4 carb servings  Men 3 - 4 carb servings 4 - 5 carb servings  Checking your blood glucose after meals will help you know if you need to adjust the  timing, type, or number of carbohydrate servings in your meal plan. Achieve and keep a healthy body weight by balancing your food intake and physical activity.  Tips How should I plan my meals?  Plan for half the food on your plate to include non-starchy vegetables, like salad greens, broccoli, or carrots. Try to eat 3 to 5 servings of non-starchy vegetables every day. Have a protein food at each meal. Protein foods include chicken, fish, meat, eggs, or beans (note that beans contain carbohydrate). These two food groups (non-starchy vegetables and proteins) are low in carbohydrate. If you fill up your plate with these foods, you will eat less carbohydrate but still fill up your stomach. Try to limit your carbohydrate portion to  of the plate.  What fats are healthiest to eat?  Diabetes increases risk for heart disease. To help protect your heart, eat more healthy fats, such as olive oil, nuts, and avocado. Eat less saturated fats like butter, cream, and high-fat meats, like bacon and sausage. Avoid trans fats, which are in all foods that list "partially hydrogenated oil" as an ingredient. What should I drink?  Choose drinks that are not sweetened with sugar. The healthiest choices are water, carbonated or seltzer waters, and tea and coffee without added sugars.  Sweet drinks will make your blood glucose go up very quickly. One serving of soda or energy drink is  cup. It is best to drink these beverages only if your blood glucose is low.  Artificially sweetened, or diet drinks, typically do not increase your blood glucose if they have zero calories in them. Read labels of beverages, as some diet drinks do have carbohydrate and will raise your blood glucose. Label Reading Tips Read Nutrition Facts labels to find out how many grams of carbohydrate are in a food you want to eat. Don't forget: sometimes serving sizes on the label aren't the same as how much food you are going to eat, so you may need to  calculate how much carbohydrate is in the food you are serving yourself.   Carbohydrate Counting for People with Diabetes Sample 1-Day Menu  Breakfast  cup yogurt, low fat, low sugar (1 carbohydrate serving)   cup cereal, ready-to-eat, unsweetened (1 carbohydrate serving)  1 cup strawberries (1 carbohydrate serving)   cup almonds ( carbohydrate serving)  Lunch 1, 5 ounce can chunk light tuna  2 ounces cheese, low fat cheddar  6 whole wheat crackers (1 carbohydrate serving)  1 small apple (1 carbohydrate servings)   cup carrots ( carbohydrate serving)   cup snap peas  1 cup 1% milk (1 carbohydrate serving)   Evening Meal Stir fry made with: 3 ounces chicken  1 cup brown rice (3 carbohydrate servings)   cup broccoli ( carbohydrate serving)   cup green beans   cup onions  1 tablespoon olive oil  2 tablespoons teriyaki sauce ( carbohydrate serving)  Evening Snack 1 extra small banana (1 carbohydrate serving)  1   tablespoon peanut butter   Carbohydrate Counting for People with Diabetes Vegan Sample 1-Day Menu  Breakfast 1 cup cooked oatmeal (2 carbohydrate servings)   cup blueberries (1 carbohydrate serving)  2 tablespoons flaxseeds  1 cup soymilk fortified with calcium and vitamin D  1 cup coffee  Lunch 2 slices whole wheat bread (2 carbohydrate servings)   cup baked tofu   cup lettuce  2 slices tomato  2 slices avocado   cup baby carrots ( carbohydrate serving)  1 orange (1 carbohydrate serving)  1 cup soymilk fortified with calcium and vitamin D   Evening Meal Burrito made with: 1 6-inch corn tortilla (1 carbohydrate serving)  1 cup refried vegetarian beans (2 carbohydrate servings)   cup chopped tomatoes   cup lettuce   cup salsa  1/3 cup brown rice (1 carbohydrate serving)  1 tablespoon olive oil for rice   cup zucchini   Evening Snack 6 small whole grain crackers (1 carbohydrate serving)  2 apricots ( carbohydrate serving)   cup unsalted peanuts  ( carbohydrate serving)    Carbohydrate Counting for People with Diabetes Vegetarian (Lacto-Ovo) Sample 1-Day Menu  Breakfast 1 cup cooked oatmeal (2 carbohydrate servings)   cup blueberries (1 carbohydrate serving)  2 tablespoons flaxseeds  1 egg  1 cup 1% milk (1 carbohydrate serving)  1 cup coffee  Lunch 2 slices whole wheat bread (2 carbohydrate servings)  2 ounces low-fat cheese   cup lettuce  2 slices tomato  2 slices avocado   cup baby carrots ( carbohydrate serving)  1 orange (1 carbohydrate serving)  1 cup unsweetened tea  Evening Meal Burrito made with: 1 6-inch corn tortilla (1 carbohydrate serving)   cup refried vegetarian beans (1 carbohydrate serving)   cup tomatoes   cup lettuce   cup salsa  1/3 cup brown rice (1 carbohydrate serving)  1 tablespoon olive oil for rice   cup zucchini  1 cup 1% milk (1 carbohydrate serving)  Evening Snack 6 small whole grain crackers (1 carbohydrate serving)  2 apricots ( carbohydrate serving)   cup unsalted peanuts ( carbohydrate serving)    Copyright 2020  [employer]y of [employer]. All rights reserved.  Using Nutrition Labels: Carbohydrate  . Serving Size  . Look at the serving size. All the information on the label is based on this portion. . Servings Per Container  . The number of servings contained in the package. . Guidelines for Carbohydrate  . Look at the total grams of carbohydrate in the serving size.  . 1 carbohydrate choice = 15 grams of carbohydrate. Range of Carbohydrate Grams Per Choice  Carbohydrate Grams/Choice Carbohydrate Choices  6-10   11-20 1  21-25 1  26-35 2  36-40 2  41-50 3  51-55 3  56-65 4  66-70 4  71-80 5    Copyright 2020  Academy of [employer] and Dietetics. All rights reserved.   

## 2020-11-03 NOTE — Progress Notes (Signed)
Pts glucose is 311, MD notified

## 2020-11-04 ENCOUNTER — Encounter (HOSPITAL_COMMUNITY): Payer: Self-pay | Admitting: Cardiology

## 2020-11-04 DIAGNOSIS — R6520 Severe sepsis without septic shock: Secondary | ICD-10-CM | POA: Diagnosis not present

## 2020-11-04 DIAGNOSIS — A419 Sepsis, unspecified organism: Secondary | ICD-10-CM | POA: Diagnosis not present

## 2020-11-04 DIAGNOSIS — M609 Myositis, unspecified: Secondary | ICD-10-CM

## 2020-11-04 LAB — IRON AND TIBC
Iron: 48 ug/dL (ref 28–170)
Saturation Ratios: 40 % — ABNORMAL HIGH (ref 10.4–31.8)
TIBC: 119 ug/dL — ABNORMAL LOW (ref 250–450)
UIBC: 71 ug/dL

## 2020-11-04 LAB — BASIC METABOLIC PANEL
Anion gap: 11 (ref 5–15)
BUN: 8 mg/dL (ref 6–20)
CO2: 28 mmol/L (ref 22–32)
Calcium: 7.6 mg/dL — ABNORMAL LOW (ref 8.9–10.3)
Chloride: 92 mmol/L — ABNORMAL LOW (ref 98–111)
Creatinine, Ser: 0.54 mg/dL (ref 0.44–1.00)
GFR, Estimated: >60 mL/min (ref >60)
Glucose, Bld: 271 mg/dL — ABNORMAL HIGH (ref 70–99)
Potassium: 3.5 mmol/L (ref 3.5–5.1)
Sodium: 131 mmol/L — ABNORMAL LOW (ref 135–145)

## 2020-11-04 LAB — GLUCOSE, CAPILLARY
Glucose-Capillary: 103 mg/dL — ABNORMAL HIGH (ref 70–99)
Glucose-Capillary: 115 mg/dL — ABNORMAL HIGH (ref 70–99)
Glucose-Capillary: 249 mg/dL — ABNORMAL HIGH (ref 70–99)
Glucose-Capillary: 266 mg/dL — ABNORMAL HIGH (ref 70–99)
Glucose-Capillary: 301 mg/dL — ABNORMAL HIGH (ref 70–99)
Glucose-Capillary: 304 mg/dL — ABNORMAL HIGH (ref 70–99)
Glucose-Capillary: 80 mg/dL (ref 70–99)

## 2020-11-04 LAB — CBC
HCT: 21.5 % — ABNORMAL LOW (ref 36.0–46.0)
Hemoglobin: 7.4 g/dL — ABNORMAL LOW (ref 12.0–15.0)
MCH: 25.9 pg — ABNORMAL LOW (ref 26.0–34.0)
MCHC: 34.4 g/dL (ref 30.0–36.0)
MCV: 75.2 fL — ABNORMAL LOW (ref 80.0–100.0)
Platelets: 126 10*3/uL — ABNORMAL LOW (ref 150–400)
RBC: 2.86 MIL/uL — ABNORMAL LOW (ref 3.87–5.11)
RDW: 13.1 % (ref 11.5–15.5)
WBC: 26 10*3/uL — ABNORMAL HIGH (ref 4.0–10.5)
nRBC: 0 % (ref 0.0–0.2)

## 2020-11-04 LAB — PROCALCITONIN: Procalcitonin: 3.9 ng/mL

## 2020-11-04 LAB — QUANTIFERON-TB GOLD PLUS

## 2020-11-04 LAB — FERRITIN: Ferritin: 1495 ng/mL — ABNORMAL HIGH (ref 11–307)

## 2020-11-04 LAB — VANCOMYCIN, TROUGH: Vancomycin Tr: 11 ug/mL — ABNORMAL LOW (ref 15–20)

## 2020-11-04 LAB — C-REACTIVE PROTEIN: CRP: 32.4 mg/dL — ABNORMAL HIGH (ref <1.0)

## 2020-11-04 NOTE — Evaluation (Signed)
Occupational Therapy Evaluation Patient Details Name: Chelsea Mathis MRN: [id number] DOB: [birth year] Today's Date: 11/04/2020    History of Present Illness 30 y.o. female admitted with DKA, multiple electrolyte disturbances and severe sepsis secondary to bacteremia. CTA (+) cavitary lung lesions. 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 CT femur suggestive of myositis on R thigh. PMHx significant for poorly controlled DMII and recent UTI.   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 Patient currently functioning below baseline demonstrating observed ADLs including LB dressing with Mod to Max A. Patient greatly limited by pain, generalized weakness and difficulty ambulating and would benefit from continued acute OT services in prep for safe d/c home with 24hr supervision/assist and HHOT. Patient likely to progress well with treatment of diagnoses listed above. OT will continue to follow acutely.      Follow Up Recommendations  Home health OT;Supervision/Assistance - 24 hour    Equipment Recommendations  3 in 1 bedside commode    Recommendations for Other Services       Precautions / Restrictions Precautions Precautions: Fall Restrictions Weight Bearing Restrictions: No      Mobility Bed Mobility Overal bed mobility: Needs Assistance Bed Mobility: Supine to Sit     Supine to sit: Mod assist     General bed mobility comments: Heavy Mod A at BLE and trunk with HOB elevated; requires increased time/effort and cues for pursed lip breathing. Limited by pain.    Transfers Overall transfer level: Needs assistance Equipment used: Rolling walker (2 wheeled) Transfers: Sit to/from [employer] Sit to Stand: Mod assist;+2 physical assistance;+2 safety/equipment Stand pivot transfers: Min assist;+2 physical assistance;+2  safety/equipment       General transfer comment: Mod A +2 for sit to stand from elevated EOB to RW; cues for pursed lip breathing, hand placement and technique. Min A +2 for stand-pivot to recliner with cues for technique; requires increased time/effort. Greatly limited by pain.    Balance Overall balance assessment: Needs assistance Sitting-balance support: Feet supported Sitting balance-Leahy Scale: Good     Standing balance support: Bilateral upper extremity supported;During functional activity Standing balance-Leahy Scale: Poor Standing balance comment: Reliant on BUE and external assist.                           ADL either performed or assessed with clinical judgement   ADL Overall ADL's : Needs assistance/impaired Eating/Feeding: Independent   Grooming: Set up;Sitting               Lower Body Dressing: Maximal assistance;Sit to/from stand   Toilet Transfer: Minimal assistance;+2 for physical assistance;+2 for safety/equipment Toilet Transfer Details (indicate cue type and reason): Simulated with stand-pivot to recliner with use of RW and Min A +2.                 Vision   Vision Assessment?: No apparent visual deficits     Perception     Praxis      Pertinent Vitals/Pain Pain Assessment: 0-10 Pain Score: 7  Pain Location: R flank with breathing and R groin/leg Pain Descriptors / Indicators: Aching;Sore Pain Intervention(s): Limited activity within patient's tolerance;Monitored during session;Repositioned     Hand Dominance Right   Extremity/Trunk Assessment Upper Extremity Assessment Upper Extremity Assessment: Generalized weakness   Lower Extremity Assessment Lower Extremity Assessment: Defer to PT evaluation  Cervical / Trunk Assessment Cervical / Trunk Assessment: Other exceptions Cervical / Trunk Exceptions: large body habitus   Communication Communication Communication: No difficulties   Cognition Arousal/Alertness:  Awake/alert Behavior During Therapy: WFL for tasks assessed/performed Overall Cognitive Status: Within Functional Limits for tasks assessed                                     General Comments  Significant other present at bedside throughout session. HR 126bpm at rest and in 130s with mild activity. BP soft at conclusion of session. SpO2 95% on 4L at rest. Down to high 80's with light activity but quickly rebounded.    Exercises     Shoulder Instructions      Home Living Family/patient expects to be discharged to:: Private residence Living Arrangements: Spouse/significant other;Children Available Help at Discharge: Family;Available 24 hours/day Type of Home: House Home Access: Stairs to enter [employer] of Steps: 1-2 Entrance Stairs-Rails: None Home Layout: Two level Alternate Level Stairs-Number of Steps: 16 Alternate Level Stairs-Rails: Right Bathroom Shower/Tub: Chief Strategy Officer: Standard     Home Equipment: Crutches          Prior Functioning/Environment Level of Independence: Independent        Comments: Drivers for door dash        OT Problem List: Decreased strength;Decreased activity tolerance;Impaired balance (sitting and/or standing);Increased edema      OT Treatment/Interventions: Self-care/ADL training;Therapeutic exercise;Energy conservation;DME and/or AE instruction;Therapeutic activities;Patient/family education    OT Goals(Current goals can be found in the care plan section) Acute Rehab OT Goals Patient Stated Goal: To return home. OT Goal Formulation: With patient Time For Goal Achievement: 11/18/20 Potential to Achieve Goals: Good ADL Goals Pt Will Perform Grooming: with modified independence;standing Pt Will Perform Upper Body Dressing: Independently;sitting Pt Will Perform Lower Body Dressing: with modified independence;sitting/lateral leans Pt Will Transfer to Toilet: with modified  independence;ambulating Pt Will Perform Toileting - Clothing Manipulation and hygiene: with modified independence;sit to/from stand Pt Will Perform Tub/Shower Transfer: Tub transfer;3 in 1;rolling walker Additional ADL Goal #1: Patient will tolerate 15 minutes of therapeutic activity with self-reported pain rating <3/10 in prep for ADLs/IADLs.  OT Frequency: Min 2X/week   Barriers to D/C: Inaccessible home environment  Steps to bedroom/bathroom on 2nd floor       Co-evaluation              AM-PAC OT "6 Clicks" Daily Activity     Outcome Measure Help from another person eating meals?: None Help from another person taking care of personal grooming?: A Little Help from another person toileting, which includes using toliet, bedpan, or urinal?: A Lot Help from another person bathing (including washing, rinsing, drying)?: A Lot Help from another person to put on and taking off regular upper body clothing?: A Little Help from another person to put on and taking off regular lower body clothing?: A Lot 6 Click Score: 16   End of Session Equipment Utilized During Treatment: Gait belt;Rolling walker;Oxygen (4L) Nurse Communication: Mobility status;Other (comment);Need for lift equipment (Stedy +2)  Activity Tolerance: Patient tolerated treatment well;No increased pain Patient left: in chair;with call bell/phone within reach;with family/visitor present  OT Visit Diagnosis: Unsteadiness on feet (R26.81);Muscle weakness (generalized) (M62.81);Pain Pain - Right/Left: Right Pain - part of body: Leg;Hip (Flank)                Time: 8341-9622  OT Time Calculation (min): 26 min Charges:  OT General Charges $OT Visit: 1 Visit OT Evaluation $OT Eval Moderate Complexity: 1 Mod OT Treatments $Therapeutic Activity: 8-22 mins  Sari Cogan H. OTR/L Supplemental OT, Department of rehab services [phone]  Lavoy Bernards R H. 11/04/2020, 10:59 AM

## 2020-11-04 NOTE — Progress Notes (Addendum)
Inpatient Diabetes Program Recommendations  AACE/ADA: New Consensus Statement on Inpatient Glycemic Control (2015)  Target Ranges:  Prepandial:   less than 140 mg/dL      Peak postprandial:   less than 180 mg/dL (1-2 hours)      Critically ill patients:  140 - 180 mg/dL   Results for Chelsea Mathis, Chelsea Mathis (MRN [id number]) as of 11/04/2020 08:08  Ref. Range 11/03/2020 08:00 11/03/2020 16:21 11/03/2020 20:22  Glucose-Capillary Latest Ref Range: 70 - 99 mg/dL 387 (H)  35 units Semglee 0839 262 (H)  18 units NOVOLOG  194 (H)  Results for Chelsea Mathis, Chelsea Mathis (MRN [id number]) as of 11/04/2020 13:10  Ref. Range 11/04/2020 08:01 11/04/2020 12:02  Glucose-Capillary Latest Ref Range: 70 - 99 mg/dL 884 (H)  21 units NOVOLOG  35 units Semglee  304 (H)  21 units NOVOLOG       Current Orders: Semglee 35 units Daily     Novolog Moderate Correction Scale/ SSI (0-15 units) TID AC     Novolog 10 units TID with meals     MD- Note CBG 301 this AM.    1.  Please consider increasing Semglee to 45 units Daily (30% increase)  Since 35 unit dose already given this AM, please also order an extra 10 units Semglee to be this AM as well  2. Increase Novolog Meal Coverage to 15 units TID     --Will follow patient during hospitalization--  Ambrose Finland RN, MSN, CDE Diabetes Coordinator Inpatient Glycemic Control Team Team Pager: [phone] (8a-5p)

## 2020-11-04 NOTE — Progress Notes (Signed)
[facility] Infectious Diseases Follow Up Note  Patient Identification: Patient Name: Chelsea Mathis MRN: [id number] Admit Date: 10/31/2020  9:34 AM Age: 30 y.o.Today's Date: 11/04/2020   Reason for Visit: MRSA bacteremia  Principal Problem:   Severe sepsis (HCC) Active Problems:   DKA (diabetic ketoacidosis) (HCC)   Bacteremia due to Staphylococcus aureus   Septic pulmonary embolism (HCC)   Type 2 diabetes mellitus with hypoglycemia without coma (HCC)  Antibiotics: Vancomycin-c                    clindamycin/zosyn 8/2    Lines/Tubes: PIVs    Interval Events: Low-grade fever 100.9 yesterday, WBC trending up to 26.  TEE suggestive of tricuspid valve endocarditis.  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  2.   Repeat blood cultures 8/2 no growth in 3 day. Will need at least 6 weeks of IV antibiotics  3.   Surgical consult given abnormal MRI findings of bilateral thigh  6.   Monitor CBC, BMP and Vancomycin trough  7.   Plan discussed with patient/Fiance/ID pharmacy and primary   Following peripherally over the weekend.   Rest of the management as per the primary team. Thank you for the consult. Please page with pertinent questions or concerns.  ______________________________________________________________________ Subjective patient seen and examined at the bedside.  She was sitting up in  the chair with the help of physical therapy.  Denies any pain in the right hip and left hip.  Pain in the right and left thigh are stable.  She feels the same as yesterday.  Denies any nausea, vomiting, abdominal pain, diarrhea and rashes.  She has chest pain when she takes a deep breath.  Discussed TEE findings with the patient and her fianc.    Vitals BP 137/74 (BP Location: Left Arm)   Pulse (!) 127   Temp 98.7 F (37.1 C) (Oral)   Resp 11   Ht  (1.626 m)   Wt 101.5 kg   LMP 10/31/2020   SpO2 (!) 89%   BMI 38.41 kg/m     Physical Exam Constitutional: not in acute distress, on nasal cannula  Cardiovascular:     Rate and Rhythm: Normal rate and regular rhythm.     Heart sounds:   Pulmonary:     Effort: on nasal cannula     Comments:   Abdominal:     Palpations: Abdomen is soft.     Tenderness: Nontender and nondistended  Musculoskeletal:        General: Tenderness in the right thigh/left thigh, no fluctuance or crepitus palpated.                    Skin              Comments: No lesions or rashes   Neurological:     General: No focal deficit present.   Psychiatric:        Mood and Affect: Mood normal.    Pertinent Microbiology Results for orders placed or performed  during the hospital encounter of 10/31/20  Resp Panel by RT-PCR (Flu A&B, Covid) Nasopharyngeal Swab     Status: None   Collection Time: 10/31/20 10:53 AM   Specimen: Nasopharyngeal Swab; Nasopharyngeal(NP) swabs in vial transport medium  Result Value Ref Range Status   SARS Coronavirus 2 by RT PCR NEGATIVE NEGATIVE Final    Comment: (NOTE) SARS-CoV-2 target nucleic acids are NOT DETECTED.  The SARS-CoV-2 RNA is generally detectable in upper respiratory specimens during the acute phase of infection. The lowest concentration of SARS-CoV-2 viral copies this assay can detect is 138 copies/mL. A negative result does not preclude SARS-Cov-2 infection and should not be used as the sole basis for  treatment or other patient management decisions. A negative result may occur with  improper specimen collection/handling, submission of specimen other than nasopharyngeal swab, presence of viral mutation(s) within the areas targeted by this assay, and inadequate number of viral copies(<138 copies/mL). A negative result must be combined with clinical observations, patient history, and epidemiological information. The expected result is Negative.  Fact Sheet for Patients:  BloggerCourse.com  Fact Sheet for Healthcare Providers:  SeriousBroker.it  This test is no t yet approved or cleared by the [place] FDA and  has been authorized for detection and/or diagnosis of SARS-CoV-2 by FDA under an Emergency Use Authorization (EUA). This EUA will remain  in effect (meaning this test can be used) for the duration of the COVID-19 declaration under Section 564(b)(1) of the Act, 21 U.S.C.section 360bbb-3(b)(1), unless the authorization is terminated  or revoked sooner.       Influenza A by PCR NEGATIVE NEGATIVE Final   Influenza B by PCR NEGATIVE NEGATIVE Final    Comment: (NOTE) The Xpert Xpress SARS-CoV-2/FLU/RSV plus assay is intended as an aid in the diagnosis of influenza from Nasopharyngeal swab specimens and should not be used as a sole basis for treatment. Nasal washings and aspirates are unacceptable for Xpert Xpress SARS-CoV-2/FLU/RSV testing.  Fact Sheet for Patients: BloggerCourse.com  Fact Sheet for Healthcare Providers: SeriousBroker.it  This test is not yet approved or cleared by the [place] FDA and has been authorized for detection and/or diagnosis of SARS-CoV-2 by FDA under an Emergency Use Authorization (EUA). This EUA will remain in effect (meaning this test can be used) for the duration of the COVID-19 declaration under Section 564(b)(1) of the Act, 21  U.S.C. section 360bbb-3(b)(1), unless the authorization is terminated or revoked.  Performed at [facility], [address], [place], [place] [address]   Blood culture (routine single)     Status: Abnormal   Collection Time: 10/31/20 11:00 AM   Specimen: BLOOD LEFT FOREARM  Result Value Ref Range Status   Specimen Description BLOOD LEFT FOREARM  Final   Special Requests   Final    BOTTLES DRAWN AEROBIC AND ANAEROBIC Blood Culture adequate volume   Culture  Setup Time   Final    GRAM POSITIVE COCCI IN CLUSTERS IN BOTH AEROBIC AND ANAEROBIC BOTTLES Organism ID to follow CRITICAL RESULT CALLED TO, READ BACK BY AND VERIFIED WITH: JCyndia Bent PHARMD, AT 0221 11/01/20 Renato Shin Performed at [facility], [address] [place], [place] [address]    Culture METHICILLIN RESISTANT STAPHYLOCOCCUS AUREUS (A)  Final   Report Status 11/03/2020 FINAL  Final   Organism ID, Bacteria METHICILLIN RESISTANT STAPHYLOCOCCUS AUREUS  Final      Susceptibility   Methicillin resistant staphylococcus aureus - MIC*    CIPROFLOXACIN >=8 RESISTANT Resistant  ERYTHROMYCIN >=8 RESISTANT Resistant     GENTAMICIN <=0.5 SENSITIVE Sensitive     OXACILLIN >=4 RESISTANT Resistant     TETRACYCLINE <=1 SENSITIVE Sensitive     VANCOMYCIN <=0.5 SENSITIVE Sensitive     TRIMETH/SULFA <=10 SENSITIVE Sensitive     CLINDAMYCIN <=0.25 SENSITIVE Sensitive     RIFAMPIN <=0.5 SENSITIVE Sensitive     Inducible Clindamycin NEGATIVE Sensitive     * METHICILLIN RESISTANT STAPHYLOCOCCUS AUREUS  Blood Culture ID Panel (Reflexed)     Status: Abnormal   Collection Time: 10/31/20 11:00 AM  Result Value Ref Range Status   Enterococcus faecalis NOT DETECTED NOT DETECTED Final   Enterococcus Faecium NOT DETECTED NOT DETECTED Final   Listeria monocytogenes NOT DETECTED NOT DETECTED Final   Staphylococcus species DETECTED (A) NOT DETECTED Final    Comment: CRITICAL RESULT CALLED TO, READ BACK BY AND VERIFIED WITH: J.  LEDFORD PHARMD, AT 0221 11/01/20 D. VANHOOK    Staphylococcus aureus (BCID) DETECTED (A) NOT DETECTED Final    Comment: Methicillin (oxacillin)-resistant Staphylococcus aureus (MRSA). MRSA is predictably resistant to beta-lactam antibiotics (except ceftaroline). Preferred therapy is vancomycin unless clinically contraindicated. Patient requires contact precautions if  hospitalized. CRITICAL RESULT CALLED TO, READ BACK BY AND VERIFIED WITH: J. LEDFORD PHARMD, AT 0221 11/01/20 D. VANHOOK    Staphylococcus epidermidis NOT DETECTED NOT DETECTED Final   Staphylococcus lugdunensis NOT DETECTED NOT DETECTED Final   Streptococcus species NOT DETECTED NOT DETECTED Final   Streptococcus agalactiae NOT DETECTED NOT DETECTED Final   Streptococcus pneumoniae NOT DETECTED NOT DETECTED Final   Streptococcus pyogenes NOT DETECTED NOT DETECTED Final   A.calcoaceticus-baumannii NOT DETECTED NOT DETECTED Final   Bacteroides fragilis NOT DETECTED NOT DETECTED Final   Enterobacterales NOT DETECTED NOT DETECTED Final   Enterobacter cloacae complex NOT DETECTED NOT DETECTED Final   Escherichia coli NOT DETECTED NOT DETECTED Final   Klebsiella aerogenes NOT DETECTED NOT DETECTED Final   Klebsiella oxytoca NOT DETECTED NOT DETECTED Final   Klebsiella pneumoniae NOT DETECTED NOT DETECTED Final   Proteus species NOT DETECTED NOT DETECTED Final   Salmonella species NOT DETECTED NOT DETECTED Final   Serratia marcescens NOT DETECTED NOT DETECTED Final   Haemophilus influenzae NOT DETECTED NOT DETECTED Final   Neisseria meningitidis NOT DETECTED NOT DETECTED Final   Pseudomonas aeruginosa NOT DETECTED NOT DETECTED Final   Stenotrophomonas maltophilia NOT DETECTED NOT DETECTED Final   Candida albicans NOT DETECTED NOT DETECTED Final   Candida auris NOT DETECTED NOT DETECTED Final   Candida glabrata NOT DETECTED NOT DETECTED Final   Candida krusei NOT DETECTED NOT DETECTED Final   Candida parapsilosis NOT DETECTED NOT  DETECTED Final   Candida tropicalis NOT DETECTED NOT DETECTED Final   Cryptococcus neoformans/gattii NOT DETECTED NOT DETECTED Final   Meth resistant mecA/C and MREJ DETECTED (A) NOT DETECTED Final    Comment: CRITICAL RESULT CALLED TO, READ BACK BY AND VERIFIED WITH: J. LEDFORD PHARMD, AT 0221 11/01/20 Renato Shin. VANHOOK Performed at [facility], [address] [place], [place] [address]   Urine Culture     Status: Abnormal   Collection Time: 10/31/20  1:29 PM   Specimen: In/Out Cath Urine  Result Value Ref Range Status   Specimen Description IN/OUT CATH URINE  Final   Special Requests   Final    NONE Performed at [facility], [address] [place], [place] [address]    Culture MULTIPLE SPECIES PRESENT, SUGGEST RECOLLECTION (A)  Final   Report  Status 11/01/2020 FINAL  Final  MRSA Next Gen by PCR, Nasal     Status: Abnormal   Collection Time: 10/31/20  7:50 PM   Specimen: Nasal Mucosa; Nasal Swab  Result Value Ref Range Status   MRSA by PCR Next Gen DETECTED (A) NOT DETECTED Final    Comment: RESULT CALLED TO, READ BACK BY AND VERIFIED WITH: WARNER,B RN AT 2144 10/31/2020 MITCHELL,L (NOTE) The GeneXpert MRSA Assay (FDA approved for NASAL specimens only), is one component of a comprehensive MRSA colonization surveillance program. It is not intended to diagnose MRSA infection nor to guide or monitor treatment for MRSA infections. Test performance is not FDA approved in patients less than 52 years old. Performed at [facility], [address] [place], [place] [address]   Culture, blood (routine x 2)     Status: None (Preliminary result)   Collection Time: 11/01/20 10:22 AM   Specimen: BLOOD RIGHT FOREARM  Result Value Ref Range Status   Specimen Description BLOOD RIGHT FOREARM  Final   Special Requests   Final    BOTTLES DRAWN AEROBIC AND ANAEROBIC Blood Culture adequate volume   Culture   Final    NO GROWTH 3 DAYS Performed at [facility], [address], [place], [place] [address]    Report Status PENDING  Incomplete  Culture, blood (routine x 2)     Status: None (Preliminary result)   Collection Time: 11/01/20 10:29 AM   Specimen: BLOOD RIGHT HAND  Result Value Ref Range Status   Specimen Description BLOOD RIGHT HAND  Final   Special Requests   Final    BOTTLES DRAWN AEROBIC AND ANAEROBIC Blood Culture adequate volume   Culture   Final    NO GROWTH 3 DAYS Performed at [facility], [address] [place], [place] [address]    Report Status PENDING  Incomplete    Pertinent Lab. CBC Latest Ref Rng & Units 11/04/2020 11/03/2020 11/02/2020  WBC 4.0 - 10.5 K/uL 26.0(H) 23.2(H) 20.6(H)  Hemoglobin 12.0 - 15.0 g/dL 7.4(L) 7.5(L) 8.3(L)  Hematocrit 36.0 - 46.0 % 21.5(L) 22.7(L) 24.4(L)  Platelets 150 - 400 K/uL 126(L) 61(L) 56(L)   CMP Latest Ref Rng & Units 11/04/2020 11/03/2020 11/02/2020  Glucose 70 - 99 mg/dL 825(K) 539(J) 673(A)  BUN 6 - 20 mg/dL 8 13 19   Creatinine 0.44 - 1.00 mg/dL 1.93 7.90  Sodium 135 - 145 mmol/L 131(L) 129(L) 127(L)  Potassium 3.5 - 5.1 mmol/L 3.5 3.7 3.6  Chloride 98 - 111 mmol/L 92(L) 89(L) 91(L)  CO2 22 - 32 mmol/L 28 27 27   Calcium 8.9 - 10.3 mg/dL 7.6(L) 8.2(L) 8.1(L)  Total Protein 6.5 - 8.1 g/dL - - -  Total Bilirubin 0.3 - 1.2 mg/dL - - -  Alkaline Phos 38 - 126 U/L - - -  AST 15 - 41 U/L - - -  ALT 0 - 44 U/L - - -     Pertinent Imaging today Plain films and CT images have been personally visualized and interpreted; radiology reports have been reviewed. Decision making incorporated into the Impression / Recommendations.  TEE prelim 11/03/20 FINDINGS:   LEFT VENTRICLE: EF is hyperdynamic. No regional wall motion abnormalities.  RIGHT VENTRICLE: Normal size and function.   LEFT ATRIUM: No thrombus/mass.   LEFT ATRIAL APPENDAGE: No thrombus/mass.   RIGHT ATRIUM: No thrombus/mass.   AORTIC VALVE:  Trileaflet. No regurgitation. No vegetation.   MITRAL VALVE:    Normal structure.  No  significant regurgitation. No vegetation.   TRICUSPID VALVE: Abnormal valve suggestive of endocarditis. There is no discrete mass seen attached to the valve, but the valve is overall thickened, and there are thickened/echogenic areas of the subvalvular apparatus. Trivial TR.   PULMONIC VALVE: Grossly normal structure. Trivial regurgitation. No apparent vegetation.   INTERATRIAL SEPTUM: No PFO or ASD seen by color Doppler.   PERICARDIUM: Trivialeffusion noted.   DESCENDING AORTA: No plaque seen     CONCLUSION: Suggestive of tricuspid valve endocarditis. While no discrete echodensity is seen on the atrial surface of the valve, the overall thickening and appearance of the valve and subvalvular apparatus supports infection. Cannot exclude that tricuspid valve had vegetation at one time resulting in septic emboli.   I spent more than 35 minutes for this patient encounter including review of prior medical records, coordination of care  with greater than 50% of time being face to face/counseling and discussing diagnostics/treatment plan with the patient/family.  Electronically signed by:   Odette Fraction, MD Infectious Disease Physician [facility] for Infectious Disease Pager: [phone]

## 2020-11-04 NOTE — Progress Notes (Addendum)
Physical Therapy Evaluation Patient Details Name: Chelsea Mathis MRN: [id number] DOB: 1990/09/09 Today's Date: 11/04/2020   History of Present Illness  30 y.o. female admitted with DKA, multiple electrolyte disturbances and severe sepsis secondary to bacteremia. CTA (+) cavitary lung lesions. 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 CT femur suggestive of myositis on R thigh. PMHx significant for poorly controlled DMII and recent UTI.  Clinical Impression  Pt admitted with above diagnosis. Pt was able to transfer to chair but needed mod assist of 2 to stand and  min assist of 2 to pivot due to pts pain.  Should progress well once pain under better control.  Will follow acutely. Pt currently with functional limitations due to the deficits listed below (see PT Problem List). Pt will benefit from skilled PT to increase their independence and safety with mobility to allow discharge to the venue listed below.       Follow Up Recommendations Home health PT;Supervision/Assistance - 24 hour    Equipment Recommendations  Rolling walker with 5" wheels    Recommendations for Other Services       Precautions / Restrictions Precautions Precautions: Fall Restrictions Weight Bearing Restrictions: No      Mobility  Bed Mobility Overal bed mobility: Needs Assistance Bed Mobility: Supine to Sit     Supine to sit: Mod assist     General bed mobility comments: Heavy Mod A at BLE and trunk with HOB elevated; requires increased time/effort and cues for pursed lip breathing. Limited by pain.    Transfers Overall transfer level: Needs assistance Equipment used: Rolling walker (2 wheeled) Transfers: Sit to/from [employer] Sit to Stand: Mod assist;+2 physical assistance;+2 safety/equipment Stand pivot transfers: Min assist;+2 physical assistance;+2 safety/equipment       General transfer comment: Mod A +2 for sit to stand from elevated EOB to RW; cues for pursed lip  breathing, hand placement and technique. Min A +2 for stand-pivot to recliner with cues for technique; requires increased time/effort. Greatly limited by pain.  Ambulation/Gait                Stairs            Wheelchair Mobility    Modified Rankin (Stroke Patients Only)       Balance Overall balance assessment: Needs assistance Sitting-balance support: Feet supported Sitting balance-Leahy Scale: Good     Standing balance support: Bilateral upper extremity supported;During functional activity Standing balance-Leahy Scale: Poor Standing balance comment: Reliant on BUE and external assist.                             Pertinent Vitals/Pain Pain Assessment: Faces Faces Pain Scale: Hurts whole lot Pain Location: R flank with breathing and R groin/leg Pain Descriptors / Indicators: Aching;Sore Pain Intervention(s): Limited activity within patient's tolerance;Monitored during session;Repositioned    Home Living Family/patient expects to be discharged to:: Private residence Living Arrangements: Spouse/significant other;Children Available Help at Discharge: Family;Available 24 hours/day Type of Home: House Home Access: Stairs to enter Entrance Stairs-Rails: None Entrance Stairs-Number of Steps: 1-2 Home Layout: Two level Home Equipment: Crutches      Prior Function Level of Independence: Independent         Comments: Drivers for door dash     Hand Dominance   Dominant Hand: Right    Extremity/Trunk Assessment   Upper Extremity Assessment Upper Extremity Assessment: Defer to OT evaluation    Lower Extremity Assessment Lower  Extremity Assessment: Generalized weakness    Cervical / Trunk Assessment Cervical / Trunk Assessment: Other exceptions Cervical / Trunk Exceptions: large body habitus  Communication   Communication: No difficulties  Cognition Arousal/Alertness: Awake/alert Behavior During Therapy: WFL for tasks  assessed/performed Overall Cognitive Status: Within Functional Limits for tasks assessed                                        General Comments General comments (skin integrity, edema, etc.): Mod A +2 for sit to stand from elevated EOB to RW; cues for pursed lip breathing, hand placement and technique. Min A +2 for stand-pivot to recliner with cues for technique; requires increased time/effort. Greatly limited by pain.    Exercises     Assessment/Plan    PT Assessment Patient needs continued PT services  PT Problem List Decreased activity tolerance;Decreased balance;Decreased mobility;Decreased knowledge of use of DME;Decreased safety awareness;Decreased knowledge of precautions;Cardiopulmonary status limiting activity;Pain       PT Treatment Interventions DME instruction;Gait training;Functional mobility training;Therapeutic activities;Therapeutic exercise;Balance training;Patient/family education;Stair training    PT Goals (Current goals can be found in the Care Plan section)  Acute Rehab PT Goals Patient Stated Goal: To return home. PT Goal Formulation: With patient Time For Goal Achievement: 11/18/20 Potential to Achieve Goals: Good    Frequency Min 3X/week   Barriers to discharge        Co-evaluation               AM-PAC PT "6 Clicks" Mobility  Outcome Measure Help needed turning from your back to your side while in a flat bed without using bedrails?: A Lot Help needed moving from lying on your back to sitting on the side of a flat bed without using bedrails?: A Lot Help needed moving to and from a bed to a chair (including a wheelchair)?: Total Help needed standing up from a chair using your arms (e.g., wheelchair or bedside chair)?: Total Help needed to walk in hospital room?: Total Help needed climbing 3-5 steps with a railing? : Total 6 Click Score: 8    End of Session Equipment Utilized During Treatment: Gait belt;Oxygen Activity  Tolerance: Patient limited by fatigue Patient left: in chair;with call bell/phone within reach;with chair alarm set;with family/visitor present Nurse Communication: Mobility status PT Visit Diagnosis: Unsteadiness on feet (R26.81);Muscle weakness (generalized) (M62.81)    Time: 7681-1572 PT Time Calculation (min) (ACUTE ONLY): 33 min   Charges:   PT Evaluation $PT Eval Moderate Complexity: 1 Mod          Davia Smyre M,PT Acute Rehab Services [phone] (pager)   Bevelyn Buckles 11/04/2020, 2:16 PM

## 2020-11-04 NOTE — Plan of Care (Signed)
  Problem: Education: Goal: Ability to describe self-care measures that may prevent or decrease complications (Diabetes Survival Skills Education) will improve Outcome: Progressing Goal: Individualized Educational Video(s) Outcome: Progressing   Problem: Metabolic: Goal: Ability to maintain appropriate glucose levels will improve Outcome: Progressing   Problem: Education: Goal: Knowledge of General Education information will improve Description: Including pain rating scale, medication(s)/side effects and non-pharmacologic comfort measures Outcome: Progressing   Problem: Health Behavior/Discharge Planning: Goal: Ability to manage health-related needs will improve Outcome: Progressing   Problem: Clinical Measurements: Goal: Ability to maintain clinical measurements within normal limits will improve Outcome: Progressing Goal: Will remain free from infection Outcome: Progressing Goal: Diagnostic test results will improve Outcome: Progressing Goal: Respiratory complications will improve Outcome: Progressing Goal: Cardiovascular complication will be avoided Outcome: Progressing   Problem: Activity: Goal: Risk for activity intolerance will decrease Outcome: Progressing   Problem: Nutrition: Goal: Adequate nutrition will be maintained Outcome: Progressing   Problem: Coping: Goal: Level of anxiety will decrease Outcome: Progressing   Problem: Elimination: Goal: Will not experience complications related to bowel motility Outcome: Progressing Goal: Will not experience complications related to urinary retention Outcome: Progressing   Problem: Pain Managment: Goal: General experience of comfort will improve Outcome: Progressing   Problem: Safety: Goal: Ability to remain free from injury will improve Outcome: Progressing   Problem: Skin Integrity: Goal: Risk for impaired skin integrity will decrease Outcome: Progressing

## 2020-11-04 NOTE — Progress Notes (Signed)
PROGRESS NOTE    Chelsea Mathis  IWP:[id number] DOB: 1990-09-22 DOA: 10/31/2020 PCP: Pcp, No   Brief Narrative: Virga Haltiwanger is a 30 y.o. female with a history of diabetes mellitus type 2.  Patient presented secondary to elevated blood sugar, weakness, fatigue with evidence of sepsis.  She started on empiric biotics with blood cultures significant for Staph aureus.  She is also found to have a septic pulmonary emboli on CT imaging concerning for possible endocarditis.   Assessment & Plan:   Principal Problem:   Severe sepsis ([place]) Active Problems:   DKA (diabetic ketoacidosis) ([place])   Bacteremia due to Staphylococcus aureus   Septic pulmonary embolism (HCC)   Type 2 diabetes mellitus with hypoglycemia without coma ([place])   Severe sepsis Present on admission. Patient admitted to ICU, but no need for vasopressor support. Cavitary lung lesions noted on CTA chest. Empirically started on Vancomycin/Clindamycin/Zosyn. Blood culture significant for staphylococcus aureus infection and transitioned to Vancomycin IV monotherapy.  Staphylococcus bacteremia Cavitary lung lesions Infectious disease consulted. Empiric antibiotics as above and now transitioned to Vancomycin IV monotherapy. Repeat chest x-ray slightly worsened, however, symptoms stable. 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 Patient presented with elevated glucose, anion gap beta-hydroxybutyric acid and low bicarbonate level. Patient started on insulin drip with rapid improvement in anion gap, CO2 and blood sugars. Transitioned off of insulin drip and to Lantus. Resolved.  Tachycardia In setting of acute illness. Fever last night. Sinus rhythm. Given IV fluids. Continues to be slightly tachycardic but stable.  Anemia In setting of thrombocytopenia while on  her menstrual cycle. Seems stable -Repeat CBC  Thrombocytopenia Likely secondary to acute infection. Has started to improve.  Diabetes mellitus, type 2 History of diabetes; recently started on steroids. Patient's hemoglobin A1C is 12.3%. -Increase to Semglee 35 units daily and start Novolog 10 units TID with meals. Switch to TID with meals -Continue carb modified diet -Diabetes coordinator for education  Hyponatremia Severe. Sodium of 103 on admission, although repeat sodium 20 min later of 120. Rapid increase with control of blood sugar. Most likely mainly secondary to pseudohyponatremia from hyperglycemia.  AKI In setting of DKA and likely accompanying dehydration. Resolved with IV fluids.  Atrial fibrillation Appears to be transient. Started on Amiodarone with bolus and drip. Currently in sinus rhythm.  Myositis/myofasciitis Secondary to infection. CK elevated on admission but repeat is normal. Aldolase is elevated. RF is mildly elevated and ANA is negative. Initial imaging without any specific findings. MRI of bilateral thighs significant for evidence of myositis/myofasciitis. Orthopedic surgery consulted and recommend no surgical intervention. Overall, improving -Percocet prn   DVT prophylaxis: SCDs Code Status:   Code Status: Full Code Family Communication: None at bedside Disposition Plan: Discharge likely home in 3-5 days pending continued workup/management of bacteremia, improvement of sepsis, myalgias   Consultants:  PCCM Infectious disease Orthopedic surgery  Procedures:  TRANSTHORACIC ECHOCARDIOGRAM (11/01/2020) IMPRESSIONS     1. Left ventricular ejection fraction, by estimation, is 70 to 75%. The  left ventricle has hyperdynamic function. The left ventricle has no  regional wall motion abnormalities. Indeterminate diastolic filling due to  E-A fusion.   2. Right ventricular systolic function is normal. The right ventricular  size is normal.   3. The mitral  valve is normal in structure. No evidence of mitral valve  regurgitation. No evidence of mitral stenosis.  4. The aortic valve is normal in structure. Aortic valve regurgitation is  not visualized. No aortic stenosis is present.   5. The inferior vena cava is normal in size with greater than 50%  respiratory variability, suggesting right atrial pressure of 3 mmHg.   Conclusion(s)/Recommendation(s): No evidence of valvular vegetations on  this transthoracic echocardiogram. Would recommend a transesophageal  echocardiogram to exclude infective endocarditis if clinically indicated  Antimicrobials: Vancomycin IV Zosyn IV Clindamycin IV    Subjective: Improved today but still with extremity pain  Objective: Vitals:   11/04/20 0600 11/04/20 0804 11/04/20 0922 11/04/20 1204  BP: (!) 144/64 137/74  113/70  Pulse:  (!) 127  (!) 119  Resp:  11  18  Temp:  98.7 F (37.1 C)  98.3 F (36.8 C)  TempSrc:  Oral  Oral  SpO2:  (!) 89% 90% 97%  Weight:      Height:        Intake/Output Summary (Last 24 hours) at 11/04/2020 1422 Last data filed at 11/04/2020 0645 Gross per 24 hour  Intake 1861.37 ml  Output 700 ml  Net 1161.37 ml    Filed Weights   11/03/20 0358 11/03/20 1336 11/04/20 0410  Weight: 98 kg 98 kg 101.5 kg    Examination:  General exam: Appears calm and comfortable and in no acute distress. Conversant. In chair. Respiratory: Rales on auscultation. Respiratory effort normal with no intercostal retractions or use of accessory muscles Cardiovascular: S1 & S2 heard, RRR. No murmurs, rubs, gallops or clicks. BLE/BUE edema is improved Gastrointestinal: Abdomen is nondistended, soft and nontender. No masses felt. Decreased bowel sounds heard Neurologic: No focal neurological deficits Musculoskeletal: No calf tenderness Skin: No cyanosis. Small bullae noted on left thigh Psychiatry: Alert and oriented. Memory intact. Mood & affect appropriate    Data Reviewed: I have  personally reviewed following labs and imaging studies  CBC Lab Results  Component Value Date   WBC 26.0 (H) 11/04/2020   RBC 2.86 (L) 11/04/2020   HGB 7.4 (L) 11/04/2020   HCT 21.5 (L) 11/04/2020   MCV 75.2 (L) 11/04/2020   MCH 25.9 (L) 11/04/2020   PLT 126 (L) 11/04/2020   MCHC 34.4 11/04/2020   RDW 13.1 11/04/2020   LYMPHSABS 1.7 10/31/2020   MONOABS 0.0 (L) 10/31/2020   EOSABS 0.0 10/31/2020   BASOSABS 0.0 33/61/2244     Last metabolic panel Lab Results  Component Value Date   NA 131 (L) 11/04/2020   K 3.5 11/04/2020   CL 92 (L) 11/04/2020   CO2 28 11/04/2020   BUN 8 11/04/2020   CREATININE 0.54 11/04/2020   GLUCOSE 271 (H) 11/04/2020   GFRNONAA >60 11/04/2020   GFRAA >60 11/10/2016   CALCIUM 7.6 (L) 11/04/2020   PHOS 3.5 11/03/2020   PROT 5.4 (L) 11/01/2020   ALBUMIN 1.5 (L) 11/01/2020   BILITOT 0.9 11/01/2020   ALKPHOS 106 11/01/2020   AST 55 (H) 11/01/2020   ALT 21 11/01/2020   ANIONGAP 11 11/04/2020    CBG (last 3)  Recent Labs    11/04/20 0406 11/04/20 0801 11/04/20 1202  GLUCAP 266* 301* 304*      GFR: Estimated Creatinine Clearance: 117 mL/min (by C-G formula based on SCr of 0.54 mg/dL).  Coagulation Profile: Recent Labs  Lab 10/31/20 1109 10/31/20 2056  INR 1.4* 1.3*     Recent Results (from the past 240 hour(s))  Resp Panel by RT-PCR (Flu A&B, Covid) Nasopharyngeal Swab     Status: None  Collection Time: 10/31/20 10:53 AM   Specimen: Nasopharyngeal Swab; Nasopharyngeal(NP) swabs in vial transport medium  Result Value Ref Range Status   SARS Coronavirus 2 by RT PCR NEGATIVE NEGATIVE Final    Comment: (NOTE) SARS-CoV-2 target nucleic acids are NOT DETECTED.  The SARS-CoV-2 RNA is generally detectable in upper respiratory specimens during the acute phase of infection. The lowest concentration of SARS-CoV-2 viral copies this assay can detect is 138 copies/mL. A negative result does not preclude SARS-Cov-2 infection and should  not be used as the sole basis for treatment or other patient management decisions. A negative result may occur with  improper specimen collection/handling, submission of specimen other than nasopharyngeal swab, presence of viral mutation(s) within the areas targeted by this assay, and inadequate number of viral copies(<138 copies/mL). A negative result must be combined with clinical observations, patient history, and epidemiological information. The expected result is Negative.  Fact Sheet for Patients:  EntrepreneurPulse.com.au  Fact Sheet for Healthcare Providers:  IncredibleEmployment.be  This test is no t yet approved or cleared by the [place] FDA and  has been authorized for detection and/or diagnosis of SARS-CoV-2 by FDA under an Emergency Use Authorization (EUA). This EUA will remain  in effect (meaning this test can be used) for the duration of the COVID-19 declaration under Section 564(b)(1) of the Act, 21 U.S.C.section 360bbb-3(b)(1), unless the authorization is terminated  or revoked sooner.       Influenza A by PCR NEGATIVE NEGATIVE Final   Influenza B by PCR NEGATIVE NEGATIVE Final    Comment: (NOTE) The Xpert Xpress SARS-CoV-2/FLU/RSV plus assay is intended as an aid in the diagnosis of influenza from Nasopharyngeal swab specimens and should not be used as a sole basis for treatment. Nasal washings and aspirates are unacceptable for Xpert Xpress SARS-CoV-2/FLU/RSV testing.  Fact Sheet for Patients: EntrepreneurPulse.com.au  Fact Sheet for Healthcare Providers: IncredibleEmployment.be  This test is not yet approved or cleared by the [place] FDA and has been authorized for detection and/or diagnosis of SARS-CoV-2 by FDA under an Emergency Use Authorization (EUA). This EUA will remain in effect (meaning this test can be used) for the duration of the COVID-19 declaration under  Section 564(b)(1) of the Act, 21 U.S.C. section 360bbb-3(b)(1), unless the authorization is terminated or revoked.  Performed at [facility], [place] [address], [place], [place] [address]   Blood culture (routine single)     Status: Abnormal   Collection Time: 10/31/20 11:00 AM   Specimen: BLOOD LEFT FOREARM  Result Value Ref Range Status   Specimen Description BLOOD LEFT FOREARM  Final   Special Requests   Final    BOTTLES DRAWN AEROBIC AND ANAEROBIC Blood Culture adequate volume   Culture  Setup Time   Final    GRAM POSITIVE COCCI IN CLUSTERS IN BOTH AEROBIC AND ANAEROBIC BOTTLES Organism ID to follow CRITICAL RESULT CALLED TO, READ BACK BY AND VERIFIED WITH: JJerrilyn Cairo PHARMD, AT 0221 11/01/20 Rush Landmark Performed at [facility], [place] [address] [place], [place] [address]    Culture METHICILLIN RESISTANT STAPHYLOCOCCUS AUREUS (A)  Final   Report Status 11/03/2020 FINAL  Final   Organism ID, Bacteria METHICILLIN RESISTANT STAPHYLOCOCCUS AUREUS  Final      Susceptibility   Methicillin resistant staphylococcus aureus - MIC*    CIPROFLOXACIN >=8 RESISTANT Resistant     ERYTHROMYCIN >=8 RESISTANT Resistant     GENTAMICIN <=0.5 SENSITIVE Sensitive     OXACILLIN >=4 RESISTANT Resistant  TETRACYCLINE <=1 SENSITIVE Sensitive     VANCOMYCIN <=0.5 SENSITIVE Sensitive     TRIMETH/SULFA <=10 SENSITIVE Sensitive     CLINDAMYCIN <=0.25 SENSITIVE Sensitive     RIFAMPIN <=0.5 SENSITIVE Sensitive     Inducible Clindamycin NEGATIVE Sensitive     * METHICILLIN RESISTANT STAPHYLOCOCCUS AUREUS  Blood Culture ID Panel (Reflexed)     Status: Abnormal   Collection Time: 10/31/20 11:00 AM  Result Value Ref Range Status   Enterococcus faecalis NOT DETECTED NOT DETECTED Final   Enterococcus Faecium NOT DETECTED NOT DETECTED Final   Listeria monocytogenes NOT DETECTED NOT DETECTED Final   Staphylococcus species DETECTED (A) NOT DETECTED Final    Comment: CRITICAL RESULT CALLED TO, READ  BACK BY AND VERIFIED WITH: J. LEDFORD PHARMD, AT 0221 11/01/20 D. VANHOOK    Staphylococcus aureus (BCID) DETECTED (A) NOT DETECTED Final    Comment: Methicillin (oxacillin)-resistant Staphylococcus aureus (MRSA). MRSA is predictably resistant to beta-lactam antibiotics (except ceftaroline). Preferred therapy is vancomycin unless clinically contraindicated. Patient requires contact precautions if  hospitalized. CRITICAL RESULT CALLED TO, READ BACK BY AND VERIFIED WITH: J. LEDFORD PHARMD, AT 0221 11/01/20 D. VANHOOK    Staphylococcus epidermidis NOT DETECTED NOT DETECTED Final   Staphylococcus lugdunensis NOT DETECTED NOT DETECTED Final   Streptococcus species NOT DETECTED NOT DETECTED Final   Streptococcus agalactiae NOT DETECTED NOT DETECTED Final   Streptococcus pneumoniae NOT DETECTED NOT DETECTED Final   Streptococcus pyogenes NOT DETECTED NOT DETECTED Final   A.calcoaceticus-baumannii NOT DETECTED NOT DETECTED Final   Bacteroides fragilis NOT DETECTED NOT DETECTED Final   Enterobacterales NOT DETECTED NOT DETECTED Final   Enterobacter cloacae complex NOT DETECTED NOT DETECTED Final   Escherichia coli NOT DETECTED NOT DETECTED Final   Klebsiella aerogenes NOT DETECTED NOT DETECTED Final   Klebsiella oxytoca NOT DETECTED NOT DETECTED Final   Klebsiella pneumoniae NOT DETECTED NOT DETECTED Final   Proteus species NOT DETECTED NOT DETECTED Final   Salmonella species NOT DETECTED NOT DETECTED Final   Serratia marcescens NOT DETECTED NOT DETECTED Final   Haemophilus influenzae NOT DETECTED NOT DETECTED Final   Neisseria meningitidis NOT DETECTED NOT DETECTED Final   Pseudomonas aeruginosa NOT DETECTED NOT DETECTED Final   Stenotrophomonas maltophilia NOT DETECTED NOT DETECTED Final   Candida albicans NOT DETECTED NOT DETECTED Final   Candida auris NOT DETECTED NOT DETECTED Final   Candida glabrata NOT DETECTED NOT DETECTED Final   Candida krusei NOT DETECTED NOT DETECTED Final   Candida  parapsilosis NOT DETECTED NOT DETECTED Final   Candida tropicalis NOT DETECTED NOT DETECTED Final   Cryptococcus neoformans/gattii NOT DETECTED NOT DETECTED Final   Meth resistant mecA/C and MREJ DETECTED (A) NOT DETECTED Final    Comment: CRITICAL RESULT CALLED TO, READ BACK BY AND VERIFIED WITH: J. LEDFORD PHARMD, AT 0221 11/01/20 Rush Landmark Performed at [facility], [address], [place], [place] [address]   Urine Culture     Status: Abnormal   Collection Time: 10/31/20  1:29 PM   Specimen: In/Out Cath Urine  Result Value Ref Range Status   Specimen Description IN/OUT CATH URINE  Final   Special Requests   Final    NONE Performed at [facility], [place] [address] [place], [place] [address]    Culture MULTIPLE SPECIES PRESENT, SUGGEST RECOLLECTION (A)  Final   Report Status 11/01/2020 FINAL  Final  MRSA Next Gen by PCR, Nasal     Status: Abnormal   Collection Time: 10/31/20  7:50 PM   Specimen: Nasal Mucosa; Nasal Swab  Result Value Ref Range Status   MRSA by PCR Next Gen DETECTED (A) NOT DETECTED Final    Comment: RESULT CALLED TO, READ BACK BY AND VERIFIED WITH: WARNER,B RN AT 2144 10/31/2020 MITCHELL,L (NOTE) The GeneXpert MRSA Assay (FDA approved for NASAL specimens only), is one component of a comprehensive MRSA colonization surveillance program. It is not intended to diagnose MRSA infection nor to guide or monitor treatment for MRSA infections. Test performance is not FDA approved in patients less than 27 years old. Performed at [facility], [place] [address] [place], [place] [address]   Culture, blood (routine x 2)     Status: None (Preliminary result)   Collection Time: 11/01/20 10:22 AM   Specimen: BLOOD RIGHT FOREARM  Result Value Ref Range Status   Specimen Description BLOOD RIGHT FOREARM  Final   Special Requests   Final    BOTTLES DRAWN AEROBIC AND ANAEROBIC Blood Culture adequate volume   Culture   Final    NO GROWTH 3 DAYS Performed at [facility], [place] [address] [place], [place] [address]    Report Status PENDING  Incomplete  Culture, blood (routine x 2)     Status: None (Preliminary result)   Collection Time: 11/01/20 10:29 AM   Specimen: BLOOD RIGHT HAND  Result Value Ref Range Status   Specimen Description BLOOD RIGHT HAND  Final   Special Requests   Final    BOTTLES DRAWN AEROBIC AND ANAEROBIC Blood Culture adequate volume   Culture   Final    NO GROWTH 3 DAYS Performed at [facility], [place] [address] [place], [place] [address]    Report Status PENDING  Incomplete         Radiology Studies: MR FEMUR RIGHT W WO CONTRAST  Result Date: 11/03/2020 CLINICAL DATA:  Soft tissue infection suspected, thigh, no prior imaging Severe left thigh pain in the setting of MRSA bacteremia EXAM: MR OF THE LEFT LOWER EXTREMITY WITHOUT AND WITH CONTRAST; MRI OF THE RIGHT FEMUR WITHOUT AND WITH CONTRAST TECHNIQUE: Multiplanar, multisequence MR imaging of the left femur was performed both before and after administration of intravenous contrast. Multiplanar, multisequence MR imaging of the right femur was performed both before and after administration of intravenous contrast. CONTRAST:  52m GADAVIST GADOBUTROL 1 MMOL/ML IV SOLN COMPARISON:  CT right femur 10/31/2020 FINDINGS: Bones/Joint/Cartilage The cortex is intact. There is no significant marrow signal alteration. No evidence of osteomyelitis. The hip and knee joints are not well visualized due to artifact, but there are partially visualized bilateral knee joint effusions. Ligaments Hip and knee joint ligamentous structures are not well visualized due to artifact. Muscles and Tendons Extensive intramuscular edema throughout the muscles of the thighs, predominantly in the anteromedial compartments and to a lesser degree the posterior compartments. There is perifascial edema and intermuscular fascial edematous thickening bilaterally, right greater than left. In the left thigh,  there are multiple intermuscular collections with adjacent fascial enhancement throughout all 3 muscular compartments. There are multiple areas of intramuscular non enhancement with surrounding rim enhancement in the adductors and in the anterior compartment (specifically the vastus intermedius muscle). In the right thigh, there are small areas of ill-defined enhancement within the gracilis muscle, adductor magnus muscle, and biceps femoris muscle (axial postcontrast images 27, 28, and 31). There is no intermuscular fascial enhancement or drainable fluid collection. Soft tissues Subcutaneous soft tissue swelling, right greater than left. IMPRESSION:  Left thigh: Myositis and myofasciitis with appearance highly concerning for aggressive soft tissue infectious process. This is evidenced by intermuscular fascial collections and fascial enhancement throughout the muscle compartments. Multiple areas of intramuscular rim enhancement/central non enhancement in the adductors and the vastus intermedius which could reflect small intramuscular abscesses or possibly diabetic myonecrosis. Right thigh: Myositis and myofasciitis, with overall severity of a much lesser degree than the left side. There are small areas of enhancement within the gracilis muscle, adductor magnus, and biceps femoris, which could be tiny early developing abscesses. No fascial enhancement or evidence of drainable fluid collection at this time. Recommend surgical consultation. These results were called by telephone at the time of interpretation on 11/03/2020 at 4:43 pm to provider Umaima Scholten , who verbally acknowledged these results. Electronically Signed   By: Maurine Simmering   On: 11/03/2020 16:49   MR FEMUR LEFT W WO CONTRAST  Result Date: 11/03/2020 CLINICAL DATA:  Soft tissue infection suspected, thigh, no prior imaging Severe left thigh pain in the setting of MRSA bacteremia EXAM: MR OF THE LEFT LOWER EXTREMITY WITHOUT AND WITH CONTRAST; MRI OF THE  RIGHT FEMUR WITHOUT AND WITH CONTRAST TECHNIQUE: Multiplanar, multisequence MR imaging of the left femur was performed both before and after administration of intravenous contrast. Multiplanar, multisequence MR imaging of the right femur was performed both before and after administration of intravenous contrast. CONTRAST:  6m GADAVIST GADOBUTROL 1 MMOL/ML IV SOLN COMPARISON:  CT right femur 10/31/2020 FINDINGS: Bones/Joint/Cartilage The cortex is intact. There is no significant marrow signal alteration. No evidence of osteomyelitis. The hip and knee joints are not well visualized due to artifact, but there are partially visualized bilateral knee joint effusions. Ligaments Hip and knee joint ligamentous structures are not well visualized due to artifact. Muscles and Tendons Extensive intramuscular edema throughout the muscles of the thighs, predominantly in the anteromedial compartments and to a lesser degree the posterior compartments. There is perifascial edema and intermuscular fascial edematous thickening bilaterally, right greater than left. In the left thigh, there are multiple intermuscular collections with adjacent fascial enhancement throughout all 3 muscular compartments. There are multiple areas of intramuscular non enhancement with surrounding rim enhancement in the adductors and in the anterior compartment (specifically the vastus intermedius muscle). In the right thigh, there are small areas of ill-defined enhancement within the gracilis muscle, adductor magnus muscle, and biceps femoris muscle (axial postcontrast images 27, 28, and 31). There is no intermuscular fascial enhancement or drainable fluid collection. Soft tissues Subcutaneous soft tissue swelling, right greater than left. IMPRESSION: Left thigh: Myositis and myofasciitis with appearance highly concerning for aggressive soft tissue infectious process. This is evidenced by intermuscular fascial collections and fascial enhancement throughout  the muscle compartments. Multiple areas of intramuscular rim enhancement/central non enhancement in the adductors and the vastus intermedius which could reflect small intramuscular abscesses or possibly diabetic myonecrosis. Right thigh: Myositis and myofasciitis, with overall severity of a much lesser degree than the left side. There are small areas of enhancement within the gracilis muscle, adductor magnus, and biceps femoris, which could be tiny early developing abscesses. No fascial enhancement or evidence of drainable fluid collection at this time. Recommend surgical consultation. These results were called by telephone at the time of interpretation on 11/03/2020 at 4:43 pm to provider Analycia Khokhar , who verbally acknowledged these results. Electronically Signed   By: JMaurine Simmering  On: 11/03/2020 16:49   DG CHEST PORT 1 VIEW  Result Date: 11/02/2020 CLINICAL DATA:  Hypoxia  EXAM: PORTABLE CHEST 1 VIEW COMPARISON:  10/31/2020 FINDINGS: Borderline to mild cardiomegaly. Worsening airspace disease bilaterally consistent with pneumonia. Possible small right effusion. No pneumothorax IMPRESSION: Worsening bilateral opacities concerning for pneumonia Electronically Signed   By: Donavan Foil M.D.   On: 11/02/2020 20:21        Scheduled Meds:  Chlorhexidine Gluconate Cloth  6 each Topical Daily   feeding supplement (GLUCERNA SHAKE)  237 mL Oral TID BM   insulin aspart  0-15 Units Subcutaneous TID WC   insulin aspart  10 Units Subcutaneous TID WC   insulin glargine-yfgn  35 Units Subcutaneous Daily   insulin starter kit- pen needles  1 kit Other Once   living well with diabetes book   Does not apply Once   multivitamin with minerals  1 tablet Oral Daily   mupirocin ointment   Nasal BID   Continuous Infusions:  vancomycin 1,250 mg (11/04/20 0542)     LOS: 4 days     Cordelia Poche, MD [facility] 11/04/2020, 2:22 PM  If 7PM-7AM, please contact night-coverage [email or web address]

## 2020-11-04 NOTE — Consult Note (Signed)
Reason for Consult:Right thigh myositis Referring Physician: Jacquelin Hawking Time called: [id number] Time at bedside: 0928   Chelsea Mathis is an 30 y.o. female.  HPI: Candita was admitted 4d ago with sepsis. She has been struggling with this since 6/24. As part of her constellation of symptoms she c/o right thigh pain that was intermittent. She described lateral pain that would occur especially if she left her leg extended for too long. She had some foot N/T early on but this has resolved. She denies any prior hx/o similar. The leg pain is getting better and she is only minimally bothered by it at this point.  Past Medical History:  Diagnosis Date   Diabetes mellitus (HCC)    Gestational diabetes     Past Surgical History:  Procedure Laterality Date   LAPAROSCOPIC APPENDECTOMY N/A 11/11/2016   Procedure: APPENDECTOMY LAPAROSCOPIC;  Surgeon: Violeta Gelinas, MD;  Location: [facility] OR;  Service: General;  Laterality: N/A;   TEE WITHOUT CARDIOVERSION N/A 11/03/2020   Procedure: TRANSESOPHAGEAL ECHOCARDIOGRAM (TEE);  Surgeon: Jodelle Red, MD;  Location: [facility] ENDOSCOPY;  Service: Cardiovascular;  Laterality: N/A;    Family History  Problem Relation Age of Onset   Diabetes Mother    Hypertension Mother    Diabetes Maternal Uncle    Diabetes Brother    Stroke Maternal Grandmother    Leukemia Paternal Grandmother    Leukemia Paternal Grandfather     Social History:  reports that she has never smoked. She has never used smokeless tobacco. She reports that she does not drink alcohol and does not use drugs.  Allergies: No Known Allergies  Medications: I have reviewed the patient's current medications.  Results for orders placed or performed during the hospital encounter of 10/31/20 (from the past 48 hour(s))  CBC     Status: Abnormal   Collection Time: 11/02/20 10:47 AM  Result Value Ref Range   WBC 20.6 (H) 4.0 - 10.5 K/uL   RBC 3.25 (L) 3.87 - 5.11 MIL/uL   Hemoglobin 8.3 (L) 12.0 -  15.0 g/dL    Comment: Reticulocyte Hemoglobin testing may be clinically indicated, consider ordering this additional test [id number]    HCT 24.4 (L) 36.0 - 46.0 %   MCV 75.1 (L) 80.0 - 100.0 fL   MCH 25.5 (L) 26.0 - 34.0 pg   MCHC 34.0 30.0 - 36.0 g/dL   RDW 23.5 57.3 - 22.0 %   Platelets 56 (L) 150 - 400 K/uL    Comment: Immature Platelet Fraction may be clinically indicated, consider ordering this additional test [id number] CONSISTENT WITH PREVIOUS RESULT REPEATED TO VERIFY    nRBC 0.0 0.0 - 0.2 %    Comment: Performed at [facility], [address], [place], [place] [address]  Glucose, capillary     Status: Abnormal   Collection Time: 11/02/20 11:26 AM  Result Value Ref Range   Glucose-Capillary 246 (H) 70 - 99 mg/dL    Comment: Glucose reference range applies only to samples taken after fasting for at least 8 hours.  Glucose, capillary     Status: Abnormal   Collection Time: 11/02/20  4:07 PM  Result Value Ref Range   Glucose-Capillary 224 (H) 70 - 99 mg/dL    Comment: Glucose reference range applies only to samples taken after fasting for at least 8 hours.  Glucose, capillary     Status: Abnormal   Collection Time: 11/02/20 11:54 PM  Result Value Ref Range   Glucose-Capillary 252 (H) 70 - 99 mg/dL  Comment: Glucose reference range applies only to samples taken after fasting for at least 8 hours.  Basic metabolic panel     Status: Abnormal   Collection Time: 11/03/20  3:02 AM  Result Value Ref Range   Sodium 129 (L) 135 - 145 mmol/L   Potassium 3.7 3.5 - 5.1 mmol/L   Chloride 89 (L) 98 - 111 mmol/L   CO2 27 22 - 32 mmol/L   Glucose, Bld 275 (H) 70 - 99 mg/dL    Comment: Glucose reference range applies only to samples taken after fasting for at least 8 hours.   BUN 13 6 - 20 mg/dL   Creatinine, Ser 1.19 0.44 - 1.00 mg/dL   Calcium 8.2 (L) 8.9 - 10.3 mg/dL   GFR, Estimated >14 >78 mL/min    Comment: (NOTE) Calculated using the CKD-EPI Creatinine Equation  (2021)    Anion gap 13 5 - 15    Comment: Performed at [facility], [address], [place], [place] [address]  Phosphorus     Status: None   Collection Time: 11/03/20  3:02 AM  Result Value Ref Range   Phosphorus 3.5 2.5 - 4.6 mg/dL    Comment: Performed at [facility], [address] [place], [place] [address]  CBC     Status: Abnormal   Collection Time: 11/03/20  3:02 AM  Result Value Ref Range   WBC 23.2 (H) 4.0 - 10.5 K/uL   RBC 2.96 (L) 3.87 - 5.11 MIL/uL   Hemoglobin 7.5 (L) 12.0 - 15.0 g/dL    Comment: Reticulocyte Hemoglobin testing may be clinically indicated, consider ordering this additional test [id number]    HCT 22.7 (L) 36.0 - 46.0 %   MCV 76.7 (L) 80.0 - 100.0 fL   MCH 25.3 (L) 26.0 - 34.0 pg   MCHC 33.0 30.0 - 36.0 g/dL   RDW 95.2 84.1 - 32.4 %   Platelets 61 (L) 150 - 400 K/uL    Comment: Immature Platelet Fraction may be clinically indicated, consider ordering this additional test [id number] CONSISTENT WITH PREVIOUS RESULT REPEATED TO VERIFY    nRBC 0.0 0.0 - 0.2 %    Comment: Performed at [facility], [address], [place], [place] [address]  Glucose, capillary     Status: Abnormal   Collection Time: 11/03/20  3:53 AM  Result Value Ref Range   Glucose-Capillary 311 (H) 70 - 99 mg/dL    Comment: Glucose reference range applies only to samples taken after fasting for at least 8 hours.  Glucose, capillary     Status: Abnormal   Collection Time: 11/03/20  8:00 AM  Result Value Ref Range   Glucose-Capillary 298 (H) 70 - 99 mg/dL    Comment: Glucose reference range applies only to samples taken after fasting for at least 8 hours.  C-reactive protein     Status: Abnormal   Collection Time: 11/03/20  9:13 AM  Result Value Ref Range   CRP 32.8 (H) <1.0 mg/dL    Comment: Performed at [facility], [address] [place], [place] [address]  Procalcitonin     Status: None   Collection Time: 11/03/20  9:13 AM  Result Value Ref  Range   Procalcitonin 5.70 ng/mL    Comment:        Interpretation: PCT > 2 ng/mL: Systemic infection (sepsis) is likely, unless other causes are known. (NOTE)       Sepsis PCT Algorithm  Lower Respiratory Tract                                      Infection PCT Algorithm    ----------------------------     ----------------------------         PCT < 0.25 ng/mL                PCT < 0.10 ng/mL          Strongly encourage             Strongly discourage   discontinuation of antibiotics    initiation of antibiotics    ----------------------------     -----------------------------       PCT 0.25 - 0.50 ng/mL            PCT 0.10 - 0.25 ng/mL               OR       >80% decrease in PCT            Discourage initiation of                                            antibiotics      Encourage discontinuation           of antibiotics    ----------------------------     -----------------------------         PCT >= 0.50 ng/mL              PCT 0.26 - 0.50 ng/mL               AND       <80% decrease in PCT              Encourage initiation of                                             antibiotics       Encourage continuation           of antibiotics    ----------------------------     -----------------------------        PCT >= 0.50 ng/mL                  PCT > 0.50 ng/mL               AND         increase in PCT                  Strongly encourage                                      initiation of antibiotics    Strongly encourage escalation           of antibiotics                                     -----------------------------  PCT <= 0.25 ng/mL                                                 OR                                        > 80% decrease in PCT                                      Discontinue / Do not initiate                                             antibiotics  Performed at [facility], [address]  [place], [place] [address]   Pregnancy, urine POC     Status: None   Collection Time: 11/03/20  2:07 PM  Result Value Ref Range   Preg Test, Ur NEGATIVE NEGATIVE    Comment:        THE SENSITIVITY OF THIS METHODOLOGY IS >24 mIU/mL   Glucose, capillary     Status: Abnormal   Collection Time: 11/03/20  4:21 PM  Result Value Ref Range   Glucose-Capillary 262 (H) 70 - 99 mg/dL    Comment: Glucose reference range applies only to samples taken after fasting for at least 8 hours.  Glucose, capillary     Status: Abnormal   Collection Time: 11/03/20  8:22 PM  Result Value Ref Range   Glucose-Capillary 194 (H) 70 - 99 mg/dL    Comment: Glucose reference range applies only to samples taken after fasting for at least 8 hours.  Glucose, capillary     Status: Abnormal   Collection Time: 11/04/20 12:26 AM  Result Value Ref Range   Glucose-Capillary 249 (H) 70 - 99 mg/dL    Comment: Glucose reference range applies only to samples taken after fasting for at least 8 hours.  Basic metabolic panel     Status: Abnormal   Collection Time: 11/04/20  2:31 AM  Result Value Ref Range   Sodium 131 (L) 135 - 145 mmol/L   Potassium 3.5 3.5 - 5.1 mmol/L   Chloride 92 (L) 98 - 111 mmol/L   CO2 28 22 - 32 mmol/L   Glucose, Bld 271 (H) 70 - 99 mg/dL    Comment: Glucose reference range applies only to samples taken after fasting for at least 8 hours.   BUN 8 6 - 20 mg/dL   Creatinine, Ser 3.29 0.44 - 1.00 mg/dL   Calcium 7.6 (L) 8.9 - 10.3 mg/dL   GFR, Estimated >51 >88 mL/min    Comment: (NOTE) Calculated using the CKD-EPI Creatinine Equation (2021)    Anion gap 11 5 - 15    Comment: Performed at [facility], [address], [place], [place] [address]  CBC     Status: Abnormal   Collection Time: 11/04/20  2:31 AM  Result Value Ref Range   WBC 26.0 (H) 4.0 - 10.5 K/uL   RBC 2.86 (L) 3.87 - 5.11 MIL/uL   Hemoglobin 7.4 (L)  12.0 - 15.0 g/dL    Comment: Reticulocyte Hemoglobin testing may be  clinically indicated, consider ordering this additional test [id number]    HCT 21.5 (L) 36.0 - 46.0 %   MCV 75.2 (L) 80.0 - 100.0 fL   MCH 25.9 (L) 26.0 - 34.0 pg   MCHC 34.4 30.0 - 36.0 g/dL   RDW 54.013.1 98.111.5 - 19.115.5 %   Platelets 126 (L) 150 - 400 K/uL    Comment: REPEATED TO VERIFY DELTA CHECK NOTED    nRBC 0.0 0.0 - 0.2 %    Comment: Performed at [facility], [address] [place], [place] [address]  C-reactive protein     Status: Abnormal   Collection Time: 11/04/20  2:31 AM  Result Value Ref Range   CRP 32.4 (H) <1.0 mg/dL    Comment: Performed at [facility], [address] [place], [place] [address]  Ferritin     Status: Abnormal   Collection Time: 11/04/20  2:31 AM  Result Value Ref Range   Ferritin 1,495 (H) 11 - 307 ng/mL    Comment: Performed at [facility], [address] [place], [place] [address]  Iron and TIBC     Status: Abnormal   Collection Time: 11/04/20  2:31 AM  Result Value Ref Range   Iron 48 28 - 170 ug/dL   TIBC 846119 (L) 962250 - 952450 ug/dL   Saturation Ratios 40 (H) 10.4 - 31.8 %   UIBC 71 ug/dL    Comment: Performed at [facility], [address] [place], [place] [address]  Procalcitonin     Status: None   Collection Time: 11/04/20  2:31 AM  Result Value Ref Range   Procalcitonin 3.90 ng/mL    Comment:        Interpretation: PCT > 2 ng/mL: Systemic infection (sepsis) is likely, unless other causes are known. (NOTE)       Sepsis PCT Algorithm           Lower Respiratory Tract                                      Infection PCT Algorithm    ----------------------------     ----------------------------         PCT < 0.25 ng/mL                PCT < 0.10 ng/mL          Strongly encourage             Strongly discourage   discontinuation of antibiotics    initiation of antibiotics    ----------------------------     -----------------------------       PCT 0.25 - 0.50 ng/mL            PCT 0.10 - 0.25 ng/mL                OR       >80% decrease in PCT            Discourage initiation of                                            antibiotics      Encourage discontinuation  of antibiotics    ----------------------------     -----------------------------         PCT >= 0.50 ng/mL              PCT 0.26 - 0.50 ng/mL               AND       <80% decrease in PCT              Encourage initiation of                                             antibiotics       Encourage continuation           of antibiotics    ----------------------------     -----------------------------        PCT >= 0.50 ng/mL                  PCT > 0.50 ng/mL               AND         increase in PCT                  Strongly encourage                                      initiation of antibiotics    Strongly encourage escalation           of antibiotics                                     -----------------------------                                           PCT <= 0.25 ng/mL                                                 OR                                        > 80% decrease in PCT                                      Discontinue / Do not initiate                                             antibiotics  Performed at [facility], [address], [place], [place] [address]   Glucose, capillary     Status: Abnormal   Collection Time: 11/04/20  4:06 AM  Result Value Ref Range   Glucose-Capillary 266 (H) 70 - 99 mg/dL  Comment: Glucose reference range applies only to samples taken after fasting for at least 8 hours.  Vancomycin, trough     Status: Abnormal   Collection Time: 11/04/20  5:57 AM  Result Value Ref Range   Vancomycin Tr 11 (L) 15 - 20 ug/mL    Comment: Performed at [facility], [address] [place], [place] [address]  Glucose, capillary     Status: Abnormal   Collection Time: 11/04/20  8:01 AM  Result Value Ref Range   Glucose-Capillary 301 (H) 70 - 99 mg/dL    Comment: Glucose reference  range applies only to samples taken after fasting for at least 8 hours.    MR FEMUR RIGHT W WO CONTRAST  Result Date: 11/03/2020 CLINICAL DATA:  Soft tissue infection suspected, thigh, no prior imaging Severe left thigh pain in the setting of MRSA bacteremia EXAM: MR OF THE LEFT LOWER EXTREMITY WITHOUT AND WITH CONTRAST; MRI OF THE RIGHT FEMUR WITHOUT AND WITH CONTRAST TECHNIQUE: Multiplanar, multisequence MR imaging of the left femur was performed both before and after administration of intravenous contrast. Multiplanar, multisequence MR imaging of the right femur was performed both before and after administration of intravenous contrast. CONTRAST:  10mL GADAVIST GADOBUTROL 1 MMOL/ML IV SOLN COMPARISON:  CT right femur 10/31/2020 FINDINGS: Bones/Joint/Cartilage The cortex is intact. There is no significant marrow signal alteration. No evidence of osteomyelitis. The hip and knee joints are not well visualized due to artifact, but there are partially visualized bilateral knee joint effusions. Ligaments Hip and knee joint ligamentous structures are not well visualized due to artifact. Muscles and Tendons Extensive intramuscular edema throughout the muscles of the thighs, predominantly in the anteromedial compartments and to a lesser degree the posterior compartments. There is perifascial edema and intermuscular fascial edematous thickening bilaterally, right greater than left. In the left thigh, there are multiple intermuscular collections with adjacent fascial enhancement throughout all 3 muscular compartments. There are multiple areas of intramuscular non enhancement with surrounding rim enhancement in the adductors and in the anterior compartment (specifically the vastus intermedius muscle). In the right thigh, there are small areas of ill-defined enhancement within the gracilis muscle, adductor magnus muscle, and biceps femoris muscle (axial postcontrast images 27, 28, and 31). There is no intermuscular  fascial enhancement or drainable fluid collection. Soft tissues Subcutaneous soft tissue swelling, right greater than left. IMPRESSION: Left thigh: Myositis and myofasciitis with appearance highly concerning for aggressive soft tissue infectious process. This is evidenced by intermuscular fascial collections and fascial enhancement throughout the muscle compartments. Multiple areas of intramuscular rim enhancement/central non enhancement in the adductors and the vastus intermedius which could reflect small intramuscular abscesses or possibly diabetic myonecrosis. Right thigh: Myositis and myofasciitis, with overall severity of a much lesser degree than the left side. There are small areas of enhancement within the gracilis muscle, adductor magnus, and biceps femoris, which could be tiny early developing abscesses. No fascial enhancement or evidence of drainable fluid collection at this time. Recommend surgical consultation. These results were called by telephone at the time of interpretation on 11/03/2020 at 4:43 pm to provider RALPH NETTEY , who verbally acknowledged these results. Electronically Signed   By: Caprice Renshaw   On: 11/03/2020 16:49   MR FEMUR LEFT W WO CONTRAST  Result Date: 11/03/2020 CLINICAL DATA:  Soft tissue infection suspected, thigh, no prior imaging Severe left thigh pain in the setting of MRSA bacteremia EXAM: MR OF THE LEFT LOWER EXTREMITY WITHOUT AND WITH CONTRAST; MRI OF  THE RIGHT FEMUR WITHOUT AND WITH CONTRAST TECHNIQUE: Multiplanar, multisequence MR imaging of the left femur was performed both before and after administration of intravenous contrast. Multiplanar, multisequence MR imaging of the right femur was performed both before and after administration of intravenous contrast. CONTRAST:  10mL GADAVIST GADOBUTROL 1 MMOL/ML IV SOLN COMPARISON:  CT right femur 10/31/2020 FINDINGS: Bones/Joint/Cartilage The cortex is intact. There is no significant marrow signal alteration. No evidence of  osteomyelitis. The hip and knee joints are not well visualized due to artifact, but there are partially visualized bilateral knee joint effusions. Ligaments Hip and knee joint ligamentous structures are not well visualized due to artifact. Muscles and Tendons Extensive intramuscular edema throughout the muscles of the thighs, predominantly in the anteromedial compartments and to a lesser degree the posterior compartments. There is perifascial edema and intermuscular fascial edematous thickening bilaterally, right greater than left. In the left thigh, there are multiple intermuscular collections with adjacent fascial enhancement throughout all 3 muscular compartments. There are multiple areas of intramuscular non enhancement with surrounding rim enhancement in the adductors and in the anterior compartment (specifically the vastus intermedius muscle). In the right thigh, there are small areas of ill-defined enhancement within the gracilis muscle, adductor magnus muscle, and biceps femoris muscle (axial postcontrast images 27, 28, and 31). There is no intermuscular fascial enhancement or drainable fluid collection. Soft tissues Subcutaneous soft tissue swelling, right greater than left. IMPRESSION: Left thigh: Myositis and myofasciitis with appearance highly concerning for aggressive soft tissue infectious process. This is evidenced by intermuscular fascial collections and fascial enhancement throughout the muscle compartments. Multiple areas of intramuscular rim enhancement/central non enhancement in the adductors and the vastus intermedius which could reflect small intramuscular abscesses or possibly diabetic myonecrosis. Right thigh: Myositis and myofasciitis, with overall severity of a much lesser degree than the left side. There are small areas of enhancement within the gracilis muscle, adductor magnus, and biceps femoris, which could be tiny early developing abscesses. No fascial enhancement or evidence of  drainable fluid collection at this time. Recommend surgical consultation. These results were called by telephone at the time of interpretation on 11/03/2020 at 4:43 pm to provider RALPH NETTEY , who verbally acknowledged these results. Electronically Signed   By: Caprice Renshaw   On: 11/03/2020 16:49   DG CHEST PORT 1 VIEW  Result Date: 11/02/2020 CLINICAL DATA:  Hypoxia EXAM: PORTABLE CHEST 1 VIEW COMPARISON:  10/31/2020 FINDINGS: Borderline to mild cardiomegaly. Worsening airspace disease bilaterally consistent with pneumonia. Possible small right effusion. No pneumothorax IMPRESSION: Worsening bilateral opacities concerning for pneumonia Electronically Signed   By: Jasmine Pang M.D.   On: 11/02/2020 20:21    Review of Systems  HENT:  Negative for ear discharge, ear pain, hearing loss and tinnitus.   Eyes:  Negative for photophobia and pain.  Respiratory:  Negative for cough and shortness of breath.   Cardiovascular:  Positive for chest pain.  Gastrointestinal:  Negative for abdominal pain, nausea and vomiting.  Genitourinary:  Negative for dysuria, flank pain, frequency and urgency.  Musculoskeletal:  Positive for arthralgias (Right thigh). Negative for back pain, myalgias and neck pain.  Neurological:  Negative for dizziness and headaches.  Hematological:  Does not bruise/bleed easily.  Psychiatric/Behavioral:  The patient is not nervous/anxious.   Blood pressure 137/74, pulse (!) 127, temperature 98.7 F (37.1 C), temperature source Oral, resp. rate 11, height  (1.626 m), weight 101.5 kg, last menstrual period 10/31/2020, SpO2 90 %, unknown if currently breastfeeding. Physical Exam  Constitutional:      General: She is not in acute distress.    Appearance: She is well-developed. She is not diaphoretic.  HENT:     Head: Normocephalic and atraumatic.  Eyes:     General: No scleral icterus.       Right eye: No discharge.        Left eye: No discharge.     Conjunctiva/sclera:  Conjunctivae normal.  Cardiovascular:     Rate and Rhythm: Normal rate and regular rhythm.  Pulmonary:     Effort: Accessory muscle usage present. No respiratory distress.  Musculoskeletal:     Cervical back: Normal range of motion.     Comments: RLE No traumatic wounds, ecchymosis, or rash  Mild TTP lateral thigh  No knee or ankle effusion  Knee stable to varus/ valgus and anterior/posterior stress  Sens DPN, SPN, TN intact  Motor EHL, ext, flex, evers 5/5  DP 2+, PT 2+, No significant edema  Skin:    General: Skin is warm and dry.  Neurological:     Mental Status: She is alert.  Psychiatric:        Mood and Affect: Mood normal.        Behavior: Behavior normal.    Assessment/Plan: Right thigh myositis -- Seems to be improving on medical therapy. Certainly would not consider I&D at this point given exam and CT findings. Would not favor any motion or WB restrictions either. Please reconsult if symptoms worsen.     Freeman Caldron, PA-C Orthopedic Surgery [phone] 11/04/2020, 9:37 AM

## 2020-11-04 NOTE — Progress Notes (Signed)
Pharmacy Antibiotic Note  Chelsea Mathis is a 30 y.o. 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 Pharmacy has been consulted for vancomycin. Pt temp elevated at 100.9 and WBC increasing to 26.0. Scr stable at 0.54.   On 8/3, vancomycin dose increased to 1250mg  Q12H. Patient did not receive AM dose on 8/4, so only a trough level was drawn on 8/5. Level returned at 11, thus patient is not at a toxic level.   Plan: Continue vancomycin 1250mg  IV Q12H F/u renal fxn and peak/trough at SS.   Height: 5\' 4"  (162.6 cm) Weight: 101.5 kg (223 lb 12.3 oz) IBW/kg (Calculated) : 54.7  Temp (24hrs), Avg:98.8 F (37.1 C), Min:97.6 F (36.4 C), Max:100.9 F (38.3 C)  Recent Labs  Lab 10/31/20 1057 10/31/20 1507 10/31/20 1700 10/31/20 2056 11/01/20 0122 11/02/20 0248 11/02/20 1047 11/03/20 0302 11/04/20 0231 11/04/20 0557  WBC  --   --   --   --  18.9* 18.3* 20.6* 23.2* 26.0*  --   CREATININE  --   --  0.93 0.59 0.57 0.57  --  0.60 0.54  --   LATICACIDVEN 4.3* 4.6* 5.6* 4.2*  --   --   --   --   --   --   VANCOTROUGH  --   --   --   --   --   --   --   --   --  11*     Estimated Creatinine Clearance: 117 mL/min (by C-G formula based on SCr of 0.54 mg/dL).    No Known Allergies  Antimicrobials this admission: Vanc 8/01 >> Zosyn 8/01 > 8/02 Clinda x 1 8/01  Microbiology results: 8/02 blood: NGTD 8/01 urine cx: contaminated sample, needs redo 8/01 blood 2/2: MRSA 8/01 Resp panel: neg   10/01, PharmD Pharmacy Resident 11/04/2020, 1:47 PM

## 2020-11-04 NOTE — Plan of Care (Signed)
  Problem: Education: Goal: Ability to describe self-care measures that may prevent or decrease complications (Diabetes Survival Skills Education) will improve Outcome: Progressing Goal: Individualized Educational Video(s) Outcome: Progressing   Problem: Metabolic: Goal: Ability to maintain appropriate glucose levels will improve Outcome: Progressing   Problem: Education: Goal: Knowledge of General Education information will improve Description: Including pain rating scale, medication(s)/side effects and non-pharmacologic comfort measures Outcome: Progressing   Problem: Health Behavior/Discharge Planning: Goal: Ability to manage health-related needs will improve Outcome: Progressing   Problem: Clinical Measurements: Goal: Ability to maintain clinical measurements within normal limits will improve Outcome: Progressing Goal: Will remain free from infection Outcome: Progressing Goal: Diagnostic test results will improve Outcome: Progressing Goal: Respiratory complications will improve Outcome: Progressing Goal: Cardiovascular complication will be avoided Outcome: Progressing   Problem: Activity: Goal: Risk for activity intolerance will decrease Outcome: Progressing   Problem: Nutrition: Goal: Adequate nutrition will be maintained Outcome: Progressing   Problem: Coping: Goal: Level of anxiety will decrease Outcome: Progressing   Problem: Elimination: Goal: Will not experience complications related to bowel motility Outcome: Progressing Goal: Will not experience complications related to urinary retention Outcome: Progressing   Problem: Pain Managment: Goal: General experience of comfort will improve Outcome: Progressing   Problem: Safety: Goal: Ability to remain free from injury will improve Outcome: Progressing   Problem: Skin Integrity: Goal: Risk for impaired skin integrity will decrease Outcome: Progressing   

## 2020-11-05 ENCOUNTER — Inpatient Hospital Stay (HOSPITAL_COMMUNITY): Payer: Medicaid Other

## 2020-11-05 LAB — BASIC METABOLIC PANEL
Anion gap: 7 (ref 5–15)
BUN: 6 mg/dL (ref 6–20)
CO2: 30 mmol/L (ref 22–32)
Calcium: 7.4 mg/dL — ABNORMAL LOW (ref 8.9–10.3)
Chloride: 91 mmol/L — ABNORMAL LOW (ref 98–111)
Creatinine, Ser: 0.42 mg/dL — ABNORMAL LOW (ref 0.44–1.00)
GFR, Estimated: >60 mL/min (ref >60)
Glucose, Bld: 124 mg/dL — ABNORMAL HIGH (ref 70–99)
Potassium: 3.3 mmol/L — ABNORMAL LOW (ref 3.5–5.1)
Sodium: 128 mmol/L — ABNORMAL LOW (ref 135–145)

## 2020-11-05 LAB — VANCOMYCIN, PEAK: Vancomycin Pk: 30 ug/mL (ref 30–40)

## 2020-11-05 LAB — CBC
HCT: 21 % — ABNORMAL LOW (ref 36.0–46.0)
Hemoglobin: 7.2 g/dL — ABNORMAL LOW (ref 12.0–15.0)
MCH: 25.6 pg — ABNORMAL LOW (ref 26.0–34.0)
MCHC: 34.3 g/dL (ref 30.0–36.0)
MCV: 74.7 fL — ABNORMAL LOW (ref 80.0–100.0)
Platelets: 225 10*3/uL (ref 150–400)
RBC: 2.81 MIL/uL — ABNORMAL LOW (ref 3.87–5.11)
RDW: 13.2 % (ref 11.5–15.5)
WBC: 28.6 10*3/uL — ABNORMAL HIGH (ref 4.0–10.5)
nRBC: 0.1 % (ref 0.0–0.2)

## 2020-11-05 LAB — OSMOLALITY, URINE: Osmolality, Ur: 271 mOsm/kg — ABNORMAL LOW (ref 300–900)

## 2020-11-05 LAB — SODIUM, URINE, RANDOM: Sodium, Ur: 20 mmol/L

## 2020-11-05 LAB — GLUCOSE, CAPILLARY
Glucose-Capillary: 139 mg/dL — ABNORMAL HIGH (ref 70–99)
Glucose-Capillary: 157 mg/dL — ABNORMAL HIGH (ref 70–99)
Glucose-Capillary: 153 mg/dL — ABNORMAL HIGH (ref 70–99)
Glucose-Capillary: 147 mg/dL — ABNORMAL HIGH (ref 70–99)
Glucose-Capillary: 147 mg/dL — ABNORMAL HIGH (ref 70–99)
Glucose-Capillary: 220 mg/dL — ABNORMAL HIGH (ref 70–99)

## 2020-11-05 LAB — PROCALCITONIN: Procalcitonin: 4.38 ng/mL

## 2020-11-05 LAB — CREATININE, URINE, RANDOM: Creatinine, Urine: 38.8 mg/dL

## 2020-11-05 IMAGING — CT CT ANGIO CHEST
2 of 6 series · 15 of 36 positions shown · IV contrast (omnipaque)
Comparison: CT [DATE]
COMPARISON: CT [DATE]

Addendum:
CLINICAL DATA: Chest pain, shortness of breath

EXAM:
CT ANGIOGRAPHY CHEST WITH CONTRAST
TECHNIQUE: Multidetector CT imaging of the chest was performed using the
standard protocol during bolus administration of intravenous
contrast. Multiplanar CT image reconstructions and MIPs were
obtained to evaluate the vascular anatomy.
CONTRAST:  55mL OMNIPAQUE IOHEXOL 350 MG/ML SOLN

[Series 7: pe thins · axial · 0.69mm/px · z∈[-225,+6]mm · 14 of 369 slices shown]
[im 19/369  lung]
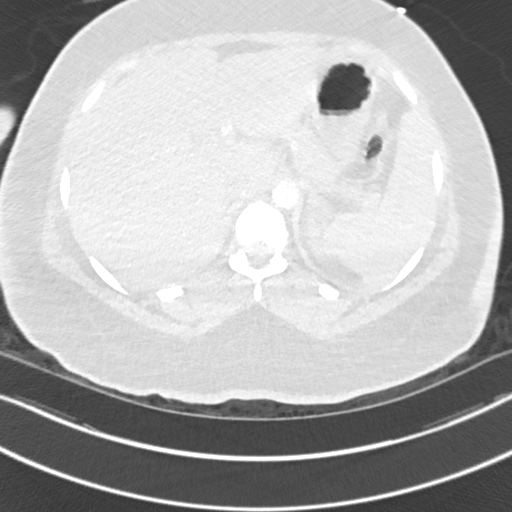
[im 56/369  mediastinal]
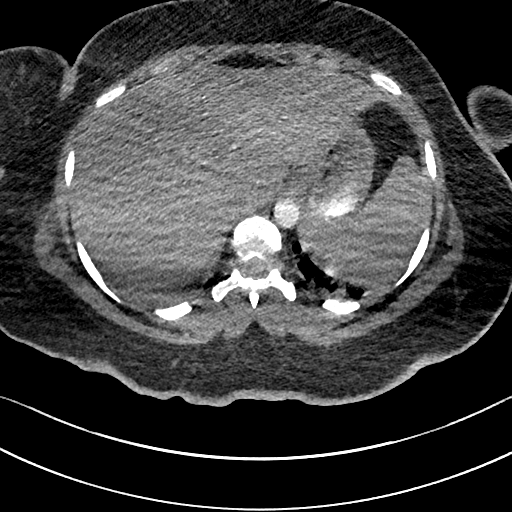
[im 74/369  lung]
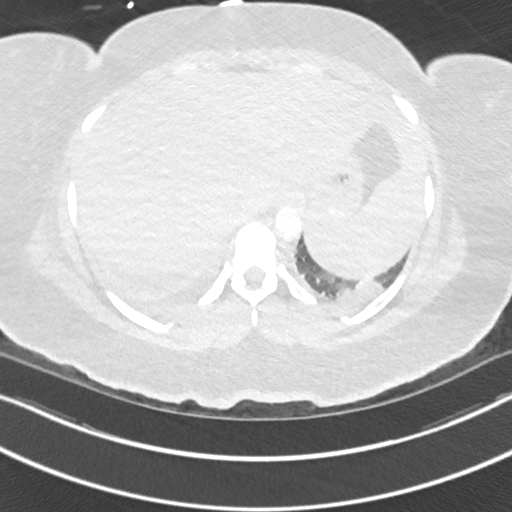
[im 93/369  mediastinal]
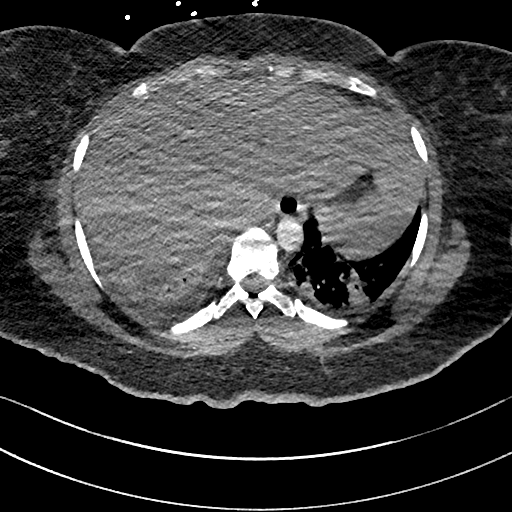
[im 129/369  lung]
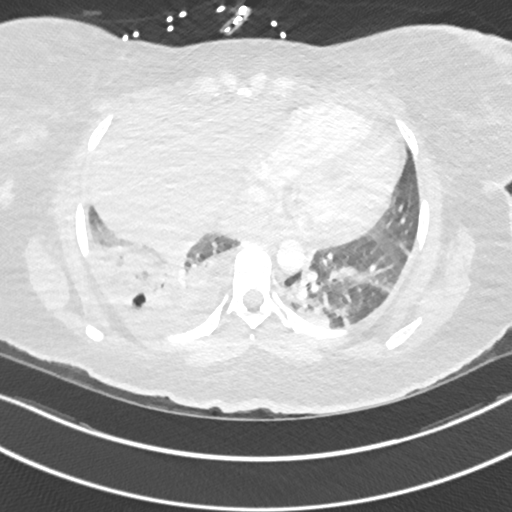
[im 148/369  mediastinal]
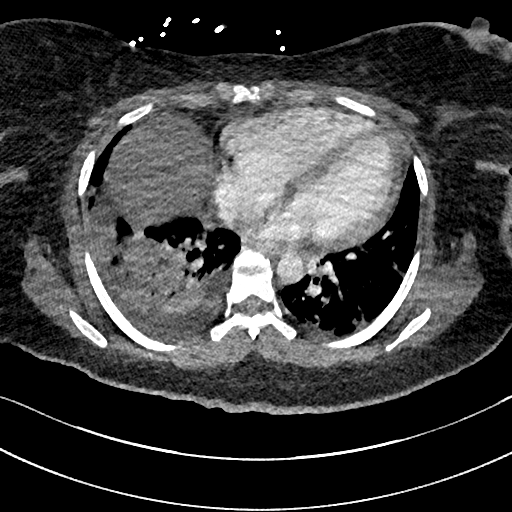
[im 166/369  lung]
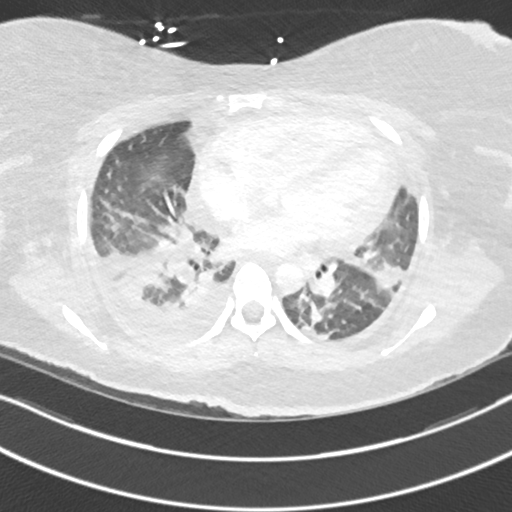
[im 203/369  mediastinal]
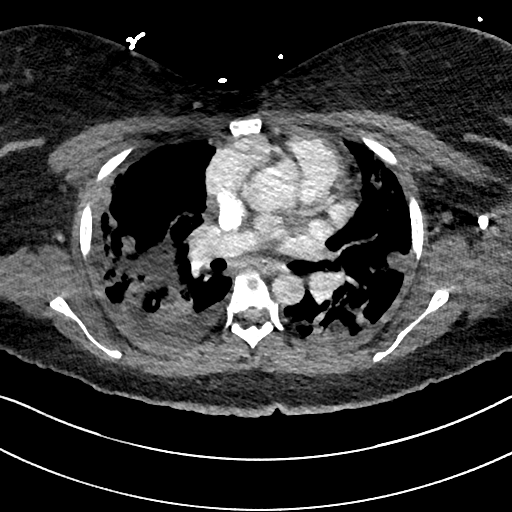
[im 221/369  lung]
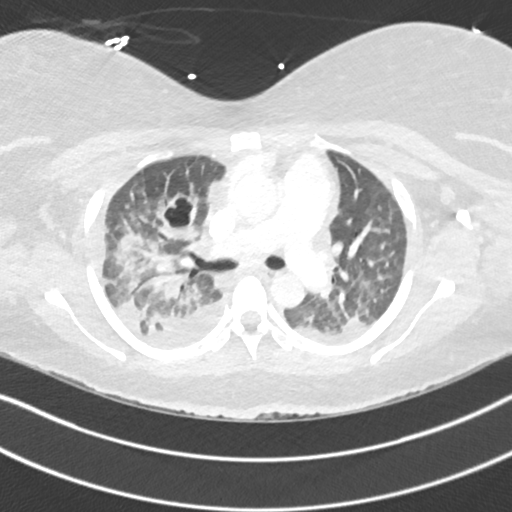
[im 240/369  mediastinal]
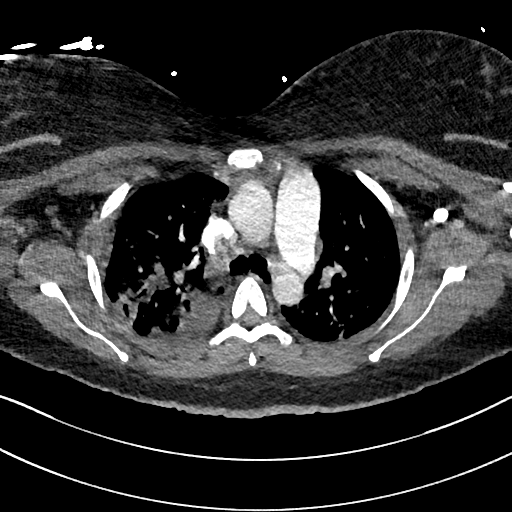
[im 277/369  lung]
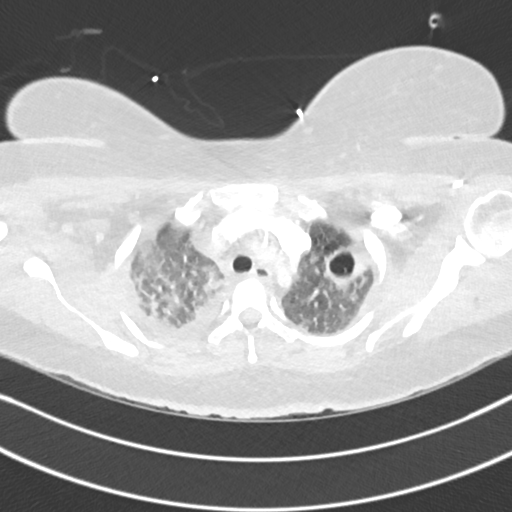
[im 295/369  mediastinal]
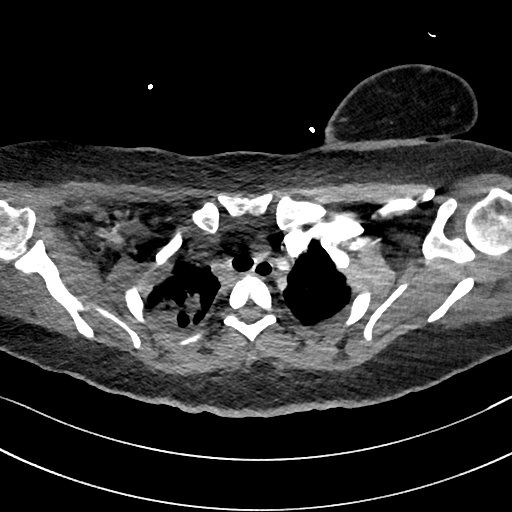
[im 313/369  lung]
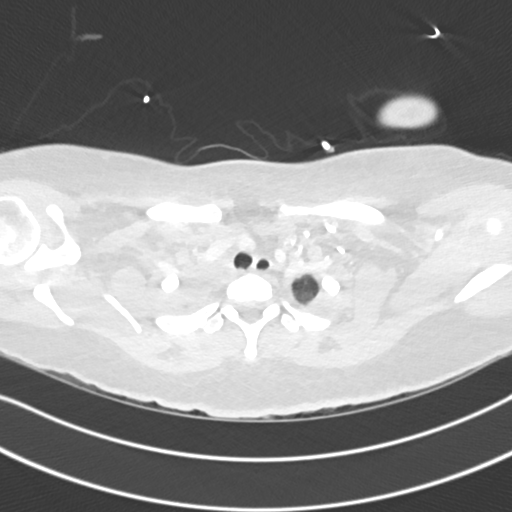
[im 350/369  mediastinal]
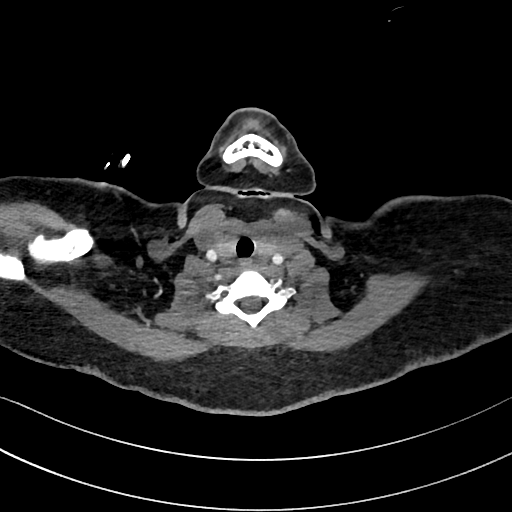

[Series 8: pe 2mm cor · coronal · 0.53mm/px · 1 of 150 slices shown]
[im 75/150  mediastinal]
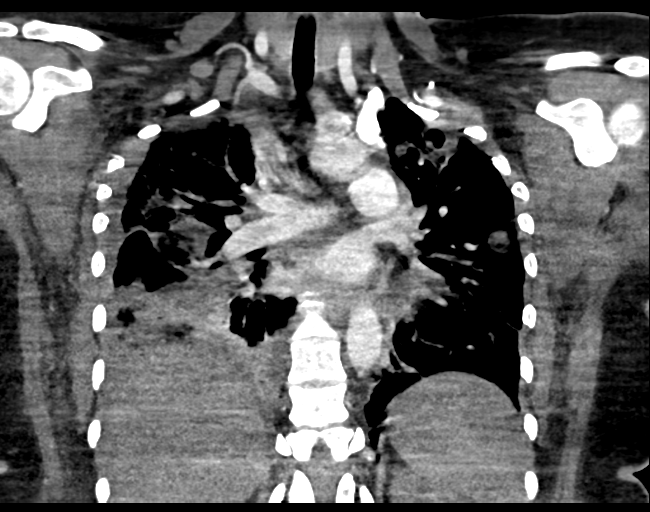

[15 of 36 positions shown; findings below may reference images not displayed]

FINDINGS: Cardiovascular: Suboptimal contrast opacification of the pulmonary
arteries with a central pulmonary artery contrast bolus measuring
186 mean Hounsfield units. Despite the suboptimal opacification,
there is demonstrable pulmonary artery embolism seen in the right
lower lobar and segmental branches (5/74). No other large central or
lobar filling defects are identified within the limitations of this
exam. Central pulmonary arteries are top-normal caliber. Elevation
of the RV/LV ratio to approximately 1.5. No pericardial effusion.
Cardiac size top normal. The aortic root is suboptimally assessed
given cardiac pulsation artifact. The aorta is normal caliber. No
acute luminal abnormality of the imaged aorta. No periaortic
stranding or hemorrhage. Shared origin of the brachiocephalic and
left common carotid arteries. Proximal great vessels are otherwise
unremarkable.

Mediastinum/Nodes: No mediastinal fluid or gas. Normal thyroid gland
and thoracic inlet. No acute abnormality of the trachea or
esophagus. No worrisome mediastinal, hilar or axillary adenopathy.

Lungs/Pleura: Redemonstration and increasing number of numerous
regions of masslike consolidation with some increasing central
cavitation throughout both lungs as well as more confluent
coalescent consolidation and interspersed regions of ground-glass
opacity in the right base. Focal volume loss with heterogeneous
enhancement of the underlying parenchyma in the right lower lobe as
well. Small right pleural effusion. Trace left pleural fluid. No
pneumothorax.

Upper Abdomen: No acute abnormalities present in the visualized
portions of the upper abdomen.

Musculoskeletal: No acute osseous abnormality or suspicious osseous
lesion.

Review of the MIP images confirms the above findings.
IMPRESSION: 1. Technically limited exam due to respiratory motion artifact and
suboptimal contrast opacification.
2. Demonstrable lobar and segmental filling defects seen in the
right lower lobe. Elevation of the RV/LV ratio and central pulmonary
artery enlargement compatible with some degree of right heart
strain.
3. Additional multifocal solid and cavitary nodular opacities
throughout the lungs with an appearance and distribution suggestive
of septic pulmonary emboli versus atypical infection.
4. Increasing consolidative opacity in the right lung base as well
as heterogeneously enhancing atelectatic lung in the right lower
lobe. Could reflect a combination of atelectasis, infectious
consolidation and pulmonary infarct in the distribution of filling
defect described above. Small right pleural effusion and trace left
effusion as well.

Currently attempting to contact the ordering provider with a
critical value result. Addendum will be submitted upon case
discussion.

ADDENDUM:
Critical Value/emergent results were called by telephone at the time
of physician contact on [DATE] at [DATE] to provider Dr STEPHANE,
who verbally acknowledged these results.

*** End of Addendum ***
FINDINGS: Cardiovascular: Suboptimal contrast opacification of the pulmonary
arteries with a central pulmonary artery contrast bolus measuring
186 mean Hounsfield units. Despite the suboptimal opacification,
there is demonstrable pulmonary artery embolism seen in the right
lower lobar and segmental branches (5/74). No other large central or
lobar filling defects are identified within the limitations of this
exam. Central pulmonary arteries are top-normal caliber. Elevation
of the RV/LV ratio to approximately 1.5. No pericardial effusion.
Cardiac size top normal. The aortic root is suboptimally assessed
given cardiac pulsation artifact. The aorta is normal caliber. No
acute luminal abnormality of the imaged aorta. No periaortic
stranding or hemorrhage. Shared origin of the brachiocephalic and
left common carotid arteries. Proximal great vessels are otherwise
unremarkable.

Mediastinum/Nodes: No mediastinal fluid or gas. Normal thyroid gland
and thoracic inlet. No acute abnormality of the trachea or
esophagus. No worrisome mediastinal, hilar or axillary adenopathy.

Lungs/Pleura: Redemonstration and increasing number of numerous
regions of masslike consolidation with some increasing central
cavitation throughout both lungs as well as more confluent
coalescent consolidation and interspersed regions of ground-glass
opacity in the right base. Focal volume loss with heterogeneous
enhancement of the underlying parenchyma in the right lower lobe as
well. Small right pleural effusion. Trace left pleural fluid. No
pneumothorax.

Upper Abdomen: No acute abnormalities present in the visualized
portions of the upper abdomen.

Musculoskeletal: No acute osseous abnormality or suspicious osseous
lesion.

Review of the MIP images confirms the above findings.
IMPRESSION: 1. Technically limited exam due to respiratory motion artifact and
suboptimal contrast opacification.
2. Demonstrable lobar and segmental filling defects seen in the
right lower lobe. Elevation of the RV/LV ratio and central pulmonary
artery enlargement compatible with some degree of right heart
strain.
3. Additional multifocal solid and cavitary nodular opacities
throughout the lungs with an appearance and distribution suggestive
of septic pulmonary emboli versus atypical infection.
4. Increasing consolidative opacity in the right lung base as well
as heterogeneously enhancing atelectatic lung in the right lower
lobe. Could reflect a combination of atelectasis, infectious
consolidation and pulmonary infarct in the distribution of filling
defect described above. Small right pleural effusion and trace left
effusion as well.

Currently attempting to contact the ordering provider with a
critical value result. Addendum will be submitted upon case
discussion.

## 2020-11-05 MED ORDER — SODIUM CHLORIDE 0.9 % IV SOLN
2.0000 g | Freq: Three times a day (TID) | INTRAVENOUS | Status: DC
  Administered 2020-11-05 – 2020-11-08 (×7): 2 g via INTRAVENOUS
  Filled 2020-11-05 (×9): qty 2

## 2020-11-05 MED ORDER — HEPARIN (PORCINE) 25000 UT/250ML-% IV SOLN
1800.0000 [IU]/h | INTRAVENOUS | Status: DC
  Administered 2020-11-05: 1500 [IU]/h via INTRAVENOUS
  Filled 2020-11-05: qty 250

## 2020-11-05 MED ORDER — HEPARIN BOLUS VIA INFUSION
5000.0000 [IU] | Freq: Once | INTRAVENOUS | Status: AC
Start: 2020-11-06 — End: 2020-11-05
  Administered 2020-11-05: 5000 [IU] via INTRAVENOUS
  Filled 2020-11-05: qty 5000

## 2020-11-05 MED ORDER — POTASSIUM CHLORIDE CRYS ER 20 MEQ PO TBCR
40.0000 meq | EXTENDED_RELEASE_TABLET | Freq: Once | ORAL | Status: AC
  Administered 2020-11-05: 40 meq via ORAL
  Filled 2020-11-05: qty 2

## 2020-11-05 MED ORDER — IOHEXOL 350 MG/ML SOLN
100.0000 mL | Freq: Once | INTRAVENOUS | Status: AC | PRN
  Administered 2020-11-05: 55 mL via INTRAVENOUS

## 2020-11-05 NOTE — Plan of Care (Signed)
  Problem: Education: Goal: Ability to describe self-care measures that may prevent or decrease complications (Diabetes Survival Skills Education) will improve Outcome: Progressing Goal: Individualized Educational Video(s) Outcome: Progressing   Problem: Metabolic: Goal: Ability to maintain appropriate glucose levels will improve Outcome: Progressing   Problem: Education: Goal: Knowledge of General Education information will improve Description: Including pain rating scale, medication(s)/side effects and non-pharmacologic comfort measures Outcome: Progressing   Problem: Health Behavior/Discharge Planning: Goal: Ability to manage health-related needs will improve Outcome: Progressing   Problem: Clinical Measurements: Goal: Ability to maintain clinical measurements within normal limits will improve Outcome: Progressing Goal: Will remain free from infection Outcome: Progressing Goal: Diagnostic test results will improve Outcome: Progressing Goal: Respiratory complications will improve Outcome: Progressing Goal: Cardiovascular complication will be avoided Outcome: Progressing   Problem: Activity: Goal: Risk for activity intolerance will decrease Outcome: Progressing   Problem: Nutrition: Goal: Adequate nutrition will be maintained Outcome: Progressing   Problem: Coping: Goal: Level of anxiety will decrease Outcome: Progressing   Problem: Elimination: Goal: Will not experience complications related to bowel motility Outcome: Progressing Goal: Will not experience complications related to urinary retention Outcome: Progressing   Problem: Pain Managment: Goal: General experience of comfort will improve Outcome: Progressing   Problem: Safety: Goal: Ability to remain free from injury will improve Outcome: Progressing   Problem: Skin Integrity: Goal: Risk for impaired skin integrity will decrease Outcome: Progressing   

## 2020-11-05 NOTE — Progress Notes (Addendum)
ANTICOAGULATION/ANTIMICROBIAL CONSULT NOTE - Initial Consult  Pharmacy Consult for heparin and cefepime Indication: pulmonary embolus and septic pulmonary emboli vs atypical infection  No Known Allergies  Patient Measurements: Height: '5\' 4"'  (162.6 cm) Weight: 101.5 kg (223 lb 12.3 oz) IBW/kg (Calculated) : 54.7 Heparin Dosing Weight: 80kg  Vital Signs: Temp: 99.2 F (37.3 C) (08/06 2047) Temp Source: Oral (08/06 2047) BP: 113/54 (08/06 2047) Pulse Rate: 128 (08/06 2047)  Labs: Recent Labs    11/03/20 0302 11/04/20 0231 11/05/20 0157  HGB 7.5* 7.4* 7.2*  HCT 22.7* 21.5* 21.0*  PLT 61* 126* 225  CREATININE 0.60 0.54 0.42*    Estimated Creatinine Clearance: 117 mL/min (A) (by C-G formula based on SCr of 0.42 mg/dL (L)).   Medical History: Past Medical History:  Diagnosis Date   Diabetes mellitus ([place])    Gestational diabetes     Medications:  Medications Prior to Admission  Medication Sig Dispense Refill Last Dose   acetaminophen (TYLENOL) 500 MG tablet Take 1,000 mg by mouth every 6 (six) hours as needed for moderate pain or headache.   Past Week   aspirin 325 MG tablet Take 1,300 mg by mouth every 6 (six) hours as needed for headache.   Past Month   ibuprofen (ADVIL) 200 MG tablet Take 800 mg by mouth every 6 (six) hours as needed for headache or mild pain.   Past Week   predniSONE (STERAPRED UNI-PAK 48 TAB) 10 MG (48) TBPK tablet Take 20-60 mg by mouth as directed. 63m four times a day for 4 days, 436mfour times a day for 4 days, 2049mwice a day for 4 days.   Past Week   amoxicillin-clavulanate (AUGMENTIN) 500-125 MG tablet Take 1 tablet by mouth 2 (two) times daily. For 5 days      HYDROcodone-acetaminophen (NORCO/VICODIN) 5-325 MG tablet Take 1 tablet by mouth every 6 (six) hours as needed. (Patient not taking: No sig reported) 6 tablet 0 Not Taking   metroNIDAZOLE (FLAGYL) 500 MG tablet Take 500 mg by mouth 2 (two) times daily. For 7 days      Scheduled:    Chlorhexidine Gluconate Cloth  6 each Topical Daily   feeding supplement (GLUCERNA SHAKE)  237 mL Oral TID BM   insulin aspart  0-15 Units Subcutaneous TID WC   insulin aspart  10 Units Subcutaneous TID WC   insulin glargine-yfgn  35 Units Subcutaneous Daily   insulin starter kit- pen needles  1 kit Other Once   living well with diabetes book   Does not apply Once   multivitamin with minerals  1 tablet Oral Daily   mupirocin ointment   Nasal BID   Infusions:   vancomycin 1,250 mg (11/05/20 1802)    Assessment: [id number] admitted 8/1 with a constellation of findings, now w/ PE and worsening septic pulmonary emboli vs atypical infection seen on CT, to begin heparin and broaden ABX with cefepime (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  Goal of Therapy:  Heparin level 0.3-0.7 units/ml Monitor platelets by anticoagulation protocol: Yes   Plan:  Heparin 5000 units IV bolus x1 followed by infusion at 1500 units/hr and monitor heparin levels and CBC. Cefepime 2g IV Q8H.  VerWynona NeatharmD, BCPS  11/05/2020,11:12 PM   Addendum: Initial heparin level undetectable; no signs of bleeding or infusion issues per RN; noted that Hgb down 7.2 > 6.2, MD aware.  Will rebolus with heparin 3000 units and increase heparin gtt by 4 units/kgABW/hr to 1800 units/hr and  check level in 6 hours.   VB 11/06/2020 6:39 AM

## 2020-11-05 NOTE — Progress Notes (Signed)
PROGRESS NOTE    Chelsea Mathis  AST:[id number] DOB: 28-Dec-1990 DOA: 10/31/2020 PCP: Pcp, No   Brief Narrative: Chelsea Mathis is a 30 y.o. female with a history of diabetes mellitus type 2.  Patient presented secondary to elevated blood sugar, weakness, fatigue with evidence of sepsis.  She started on empiric biotics with blood cultures significant for Staph aureus.  She is also found to have a septic pulmonary emboli on CT imaging concerning for possible endocarditis.   Assessment & Plan:   Principal Problem:   Severe sepsis ([place]) Active Problems:   DKA (diabetic ketoacidosis) ([place])   Bacteremia due to Staphylococcus aureus   Septic pulmonary embolism (H[facility])   Type 2 diabetes mellitus with hypoglycemia without coma ([place])   Myositis   Myofasciitis   Severe sepsis Present on admission. Patient admitted to ICU, but no need for vasopressor support. Cavitary lung lesions noted on CTA chest. Empirically started on Vancomycin/Clindamycin/Zosyn. Blood culture significant for staphylococcus aureus infection and transitioned to Vancomycin IV monotherapy.  Staphylococcus bacteremia Cavitary lung lesions Infectious disease consulted. Empiric antibiotics as above and now transitioned to Vancomycin IV monotherapy. Repeat chest x-ray slightly worsened, however, symptoms stable. 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 Repeat BCx (8/2) no growth however BCx (8/5) positive for GPCs in clusters -ID recommendations: Vancomycin IV, overall 6 week plan for antibiotics -Follow-up Blood cultures (8/5 and 8/6)  DKA In setting of infection and recent prednisone use. Patient presented with elevated glucose, anion gap beta-hydroxybutyric acid and low bicarbonate level. Patient started on insulin drip with rapid improvement in anion gap, CO2 and blood sugars. Transitioned off of insulin drip and to Lantus. Resolved.  Tachycardia In setting of acute illness. Fever  last night. Sinus rhythm. Given IV fluids. Continues to be slightly tachycardic but stable. In light of worsened right sided chest pain and continued hypoxia (in setting of septic emboli), may need to consider PE -CTA chest  Anemia In setting of thrombocytopenia while on her menstrual cycle. Seems to be drifting slightly -Repeat CBC  Thrombocytopenia Likely secondary to acute infection. Resolved.  Diabetes mellitus, type 2 History of diabetes; recently started on steroids. Patient's hemoglobin A1C is 12.3%. -Continue Semglee 35 units daily and Novolog 10 units TID with meals. Continue SSI with meals -Continue carb modified diet  Hyponatremia Severe. Sodium of 103 on admission, although repeat sodium 20 min later of 120. Rapid increase with control of blood sugar. Most likely mainly secondary to pseudohyponatremia from hyperglycemia. Slightly decreased again. -Repeat BMP in AM -Urine sodium/osmolality/creatinine  AKI In setting of DKA and likely accompanying dehydration. Resolved with IV fluids.  Atrial fibrillation Appears to be transient. Started on Amiodarone with bolus and drip. Currently in sinus rhythm.  Myositis/myofasciitis Secondary to infection. CK elevated on admission but repeat is normal. Aldolase is elevated. RF is mildly elevated and ANA is negative. Initial imaging without any specific findings. MRI of bilateral thighs significant for evidence of myositis/myofasciitis. Orthopedic surgery consulted and recommend no surgical intervention. Overall, improving -Percocet prn   DVT prophylaxis: SCDs Code Status:   Code Status: Full Code Family Communication: None at bedside Disposition Plan: Discharge likely home in 3-5 days pending continued workup/management of bacteremia, improvement of sepsis, myalgias   Consultants:  PCCM Infectious disease Orthopedic surgery  Procedures:  TRANSTHORACIC ECHOCARDIOGRAM (11/01/2020) IMPRESSIONS     1. Left ventricular ejection  fraction, by estimation, is 70 to 75%. The  left ventricle has hyperdynamic function. The left ventricle has no  regional wall  motion abnormalities. Indeterminate diastolic filling due to  E-A fusion.   2. Right ventricular systolic function is normal. The right ventricular  size is normal.   3. The mitral valve is normal in structure. No evidence of mitral valve  regurgitation. No evidence of mitral stenosis.   4. The aortic valve is normal in structure. Aortic valve regurgitation is  not visualized. No aortic stenosis is present.   5. The inferior vena cava is normal in size with greater than 50%  respiratory variability, suggesting right atrial pressure of 3 mmHg.   Conclusion(s)/Recommendation(s): No evidence of valvular vegetations on  this transthoracic echocardiogram. Would recommend a transesophageal  echocardiogram to exclude infective endocarditis if clinically indicated  Antimicrobials: Vancomycin IV Zosyn IV Clindamycin IV    Subjective: Continues to improved but feels she goes up and down. Right sided chest pain that is worse with inspiration.  Objective: Vitals:   11/05/20 0415 11/05/20 0600 11/05/20 0739 11/05/20 1136  BP: (!) 113/55  122/62 110/71  Pulse: (!) 122  (!) 116 (!) 115  Resp: (!) 31  (!) 28 (!) 22  Temp: 100.3 F (37.9 C) 98 F (36.7 C) 98.4 F (36.9 C) (!) 97.4 F (36.3 C)  TempSrc: Oral Oral Oral Oral  SpO2: 95%  96% 97%  Weight:      Height:        Intake/Output Summary (Last 24 hours) at 11/05/2020 1514 Last data filed at 11/05/2020 7322 Gross per 24 hour  Intake --  Output 1450 ml  Net -1450 ml    Filed Weights   11/03/20 0358 11/03/20 1336 11/04/20 0410  Weight: 98 kg 98 kg 101.5 kg    Examination:  General exam: Appears calm and comfortable and in no acute distress. Conversant Respiratory: Diminished to auscultation. Respiratory effort normal with no intercostal retractions or use of accessory muscles Cardiovascular: S1 & S2  heard, RRR. No murmurs, rubs, gallops or clicks. No pitting edema noted. Gastrointestinal: Abdomen is nondistended, soft and nontender. No masses felt. Normal bowel sounds heard Neurologic: No focal neurological deficits Musculoskeletal: No calf tenderness. Bilateral forearms are much softer with mild tenderness Skin: No cyanosis. No new rashes Psychiatry: Alert and oriented. Memory intact. Mood & affect appropriate   Data Reviewed: I have personally reviewed following labs and imaging studies  CBC Lab Results  Component Value Date   WBC 28.6 (H) 11/05/2020   RBC 2.81 (L) 11/05/2020   HGB 7.2 (L) 11/05/2020   HCT 21.0 (L) 11/05/2020   MCV 74.7 (L) 11/05/2020   MCH 25.6 (L) 11/05/2020   PLT 225 11/05/2020   MCHC 34.3 11/05/2020   RDW 13.2 11/05/2020   LYMPHSABS 1.7 10/31/2020   MONOABS 0.0 (L) 10/31/2020   EOSABS 0.0 10/31/2020   BASOSABS 0.0 02/54/2706     Last metabolic panel Lab Results  Component Value Date   NA 128 (L) 11/05/2020   K 3.3 (L) 11/05/2020   CL 91 (L) 11/05/2020   CO2 30 11/05/2020   BUN 6 11/05/2020   CREATININE 0.42 (L) 11/05/2020   GLUCOSE 124 (H) 11/05/2020   GFRNONAA >60 11/05/2020   GFRAA >60 11/10/2016   CALCIUM 7.4 (L) 11/05/2020   PHOS 3.5 11/03/2020   PROT 5.4 (L) 11/01/2020   ALBUMIN 1.5 (L) 11/01/2020   BILITOT 0.9 11/01/2020   ALKPHOS 106 11/01/2020   AST 55 (H) 11/01/2020   ALT 21 11/01/2020   ANIONGAP 7 11/05/2020    CBG (last 3)  Recent Labs  11/05/20 0411 11/05/20 0741 11/05/20 1137  GLUCAP 139* 157* 153*      GFR: Estimated Creatinine Clearance: 117 mL/min (A) (by C-G formula based on SCr of 0.42 mg/dL (L)).  Coagulation Profile: Recent Labs  Lab 10/31/20 1109 10/31/20 2056  INR 1.4* 1.3*     Recent Results (from the past 240 hour(s))  Resp Panel by RT-PCR (Flu A&B, Covid) Nasopharyngeal Swab     Status: None   Collection Time: 10/31/20 10:53 AM   Specimen: Nasopharyngeal Swab; Nasopharyngeal(NP) swabs  in vial transport medium  Result Value Ref Range Status   SARS Coronavirus 2 by RT PCR NEGATIVE NEGATIVE Final    Comment: (NOTE) SARS-CoV-2 target nucleic acids are NOT DETECTED.  The SARS-CoV-2 RNA is generally detectable in upper respiratory specimens during the acute phase of infection. The lowest concentration of SARS-CoV-2 viral copies this assay can detect is 138 copies/mL. A negative result does not preclude SARS-Cov-2 infection and should not be used as the sole basis for treatment or other patient management decisions. A negative result may occur with  improper specimen collection/handling, submission of specimen other than nasopharyngeal swab, presence of viral mutation(s) within the areas targeted by this assay, and inadequate number of viral copies(<138 copies/mL). A negative result must be combined with clinical observations, patient history, and epidemiological information. The expected result is Negative.  Fact Sheet for Patients:  EntrepreneurPulse.com.au  Fact Sheet for Healthcare Providers:  IncredibleEmployment.be  This test is no t yet approved or cleared by the [place] FDA and  has been authorized for detection and/or diagnosis of SARS-CoV-2 by FDA under an Emergency Use Authorization (EUA). This EUA will remain  in effect (meaning this test can be used) for the duration of the COVID-19 declaration under Section 564(b)(1) of the Act, 21 U.S.C.section 360bbb-3(b)(1), unless the authorization is terminated  or revoked sooner.       Influenza A by PCR NEGATIVE NEGATIVE Final   Influenza B by PCR NEGATIVE NEGATIVE Final    Comment: (NOTE) The Xpert Xpress SARS-CoV-2/FLU/RSV plus assay is intended as an aid in the diagnosis of influenza from Nasopharyngeal swab specimens and should not be used as a sole basis for treatment. Nasal washings and aspirates are unacceptable for Xpert Xpress  SARS-CoV-2/FLU/RSV testing.  Fact Sheet for Patients: EntrepreneurPulse.com.au  Fact Sheet for Healthcare Providers: IncredibleEmployment.be  This test is not yet approved or cleared by the [place] FDA and has been authorized for detection and/or diagnosis of SARS-CoV-2 by FDA under an Emergency Use Authorization (EUA). This EUA will remain in effect (meaning this test can be used) for the duration of the COVID-19 declaration under Section 564(b)(1) of the Act, 21 U.S.C. section 360bbb-3(b)(1), unless the authorization is terminated or revoked.  Performed at [facility], [place] [address], [place],  [address]   Blood culture (routine single)     Status: Abnormal   Collection Time: 10/31/20 11:00 AM   Specimen: BLOOD LEFT FOREARM  Result Value Ref Range Status   Specimen Description BLOOD LEFT FOREARM  Final   Special Requests   Final    BOTTLES DRAWN AEROBIC AND ANAEROBIC Blood Culture adequate volume   Culture  Setup Time   Final    GRAM POSITIVE COCCI IN CLUSTERS IN BOTH AEROBIC AND ANAEROBIC BOTTLES Organism ID to follow CRITICAL RESULT CALLED TO, READ BACK BY AND VERIFIED WITH: JJerrilyn Cairo PHARMD, AT 0221 11/01/20 Rush Landmark Performed at [facility], [place] [address] [place], [place]  [address]    Culture METHICILLIN RESISTANT STAPHYLOCOCCUS AUREUS (A)  Final   Report Status 11/03/2020 FINAL  Final   Organism ID, Bacteria METHICILLIN RESISTANT STAPHYLOCOCCUS AUREUS  Final      Susceptibility   Methicillin resistant staphylococcus aureus - MIC*    CIPROFLOXACIN >=8 RESISTANT Resistant     ERYTHROMYCIN >=8 RESISTANT Resistant     GENTAMICIN <=0.5 SENSITIVE Sensitive     OXACILLIN >=4 RESISTANT Resistant     TETRACYCLINE <=1 SENSITIVE Sensitive     VANCOMYCIN <=0.5 SENSITIVE Sensitive     TRIMETH/SULFA <=10 SENSITIVE Sensitive     CLINDAMYCIN <=0.25 SENSITIVE Sensitive     RIFAMPIN <=0.5 SENSITIVE Sensitive      Inducible Clindamycin NEGATIVE Sensitive     * METHICILLIN RESISTANT STAPHYLOCOCCUS AUREUS  Blood Culture ID Panel (Reflexed)     Status: Abnormal   Collection Time: 10/31/20 11:00 AM  Result Value Ref Range Status   Enterococcus faecalis NOT DETECTED NOT DETECTED Final   Enterococcus Faecium NOT DETECTED NOT DETECTED Final   Listeria monocytogenes NOT DETECTED NOT DETECTED Final   Staphylococcus species DETECTED (A) NOT DETECTED Final    Comment: CRITICAL RESULT CALLED TO, READ BACK BY AND VERIFIED WITH: J. LEDFORD PHARMD, AT 0221 11/01/20 D. VANHOOK    Staphylococcus aureus (BCID) DETECTED (A) NOT DETECTED Final    Comment: Methicillin (oxacillin)-resistant Staphylococcus aureus (MRSA). MRSA is predictably resistant to beta-lactam antibiotics (except ceftaroline). Preferred therapy is vancomycin unless clinically contraindicated. Patient requires contact precautions if  hospitalized. CRITICAL RESULT CALLED TO, READ BACK BY AND VERIFIED WITH: J. LEDFORD PHARMD, AT 0221 11/01/20 D. VANHOOK    Staphylococcus epidermidis NOT DETECTED NOT DETECTED Final   Staphylococcus lugdunensis NOT DETECTED NOT DETECTED Final   Streptococcus species NOT DETECTED NOT DETECTED Final   Streptococcus agalactiae NOT DETECTED NOT DETECTED Final   Streptococcus pneumoniae NOT DETECTED NOT DETECTED Final   Streptococcus pyogenes NOT DETECTED NOT DETECTED Final   A.calcoaceticus-baumannii NOT DETECTED NOT DETECTED Final   Bacteroides fragilis NOT DETECTED NOT DETECTED Final   Enterobacterales NOT DETECTED NOT DETECTED Final   Enterobacter cloacae complex NOT DETECTED NOT DETECTED Final   Escherichia coli NOT DETECTED NOT DETECTED Final   Klebsiella aerogenes NOT DETECTED NOT DETECTED Final   Klebsiella oxytoca NOT DETECTED NOT DETECTED Final   Klebsiella pneumoniae NOT DETECTED NOT DETECTED Final   Proteus species NOT DETECTED NOT DETECTED Final   Salmonella species NOT DETECTED NOT DETECTED Final   Serratia  marcescens NOT DETECTED NOT DETECTED Final   Haemophilus influenzae NOT DETECTED NOT DETECTED Final   Neisseria meningitidis NOT DETECTED NOT DETECTED Final   Pseudomonas aeruginosa NOT DETECTED NOT DETECTED Final   Stenotrophomonas maltophilia NOT DETECTED NOT DETECTED Final   Candida albicans NOT DETECTED NOT DETECTED Final   Candida auris NOT DETECTED NOT DETECTED Final   Candida glabrata NOT DETECTED NOT DETECTED Final   Candida krusei NOT DETECTED NOT DETECTED Final   Candida parapsilosis NOT DETECTED NOT DETECTED Final   Candida tropicalis NOT DETECTED NOT DETECTED Final   Cryptococcus neoformans/gattii NOT DETECTED NOT DETECTED Final   Meth resistant mecA/C and MREJ DETECTED (A) NOT DETECTED Final    Comment: CRITICAL RESULT CALLED TO, READ BACK BY AND VERIFIED WITH: J. LEDFORD PHARMD, AT 0221 11/01/20 Rush Landmark Performed at [facility], [address] [place], [place] [address]   Urine Culture     Status: Abnormal   Collection Time: 10/31/20  1:29 PM   Specimen: In/Out  Cath Urine  Result Value Ref Range Status   Specimen Description IN/OUT CATH URINE  Final   Special Requests   Final    NONE Performed at [facility], [address], [place], [place] [address]    Culture MULTIPLE SPECIES PRESENT, SUGGEST RECOLLECTION (A)  Final   Report Status 11/01/2020 FINAL  Final  MRSA Next Gen by PCR, Nasal     Status: Abnormal   Collection Time: 10/31/20  7:50 PM   Specimen: Nasal Mucosa; Nasal Swab  Result Value Ref Range Status   MRSA by PCR Next Gen DETECTED (A) NOT DETECTED Final    Comment: RESULT CALLED TO, READ BACK BY AND VERIFIED WITH: WARNER,B RN AT 2144 10/31/2020 MITCHELL,L (NOTE) The GeneXpert MRSA Assay (FDA approved for NASAL specimens only), is one component of a comprehensive MRSA colonization surveillance program. It is not intended to diagnose MRSA infection nor to guide or monitor treatment for MRSA infections. Test performance is not FDA approved  in patients less than 46 years old. Performed at [facility], [place] [address] [place], [place] [address]   Culture, blood (routine x 2)     Status: None (Preliminary result)   Collection Time: 11/01/20 10:22 AM   Specimen: BLOOD RIGHT FOREARM  Result Value Ref Range Status   Specimen Description BLOOD RIGHT FOREARM  Final   Special Requests   Final    BOTTLES DRAWN AEROBIC AND ANAEROBIC Blood Culture adequate volume   Culture   Final    NO GROWTH 4 DAYS Performed at [facility], [place] [address] [place], [place] [address]    Report Status PENDING  Incomplete  Culture, blood (routine x 2)     Status: None (Preliminary result)   Collection Time: 11/01/20 10:29 AM   Specimen: BLOOD RIGHT HAND  Result Value Ref Range Status   Specimen Description BLOOD RIGHT HAND  Final   Special Requests   Final    BOTTLES DRAWN AEROBIC AND ANAEROBIC Blood Culture adequate volume   Culture   Final    NO GROWTH 4 DAYS Performed at [facility], [place] [address] [place], [place] [address]    Report Status PENDING  Incomplete  Culture, blood (routine x 2)     Status: None (Preliminary result)   Collection Time: 11/04/20  5:50 AM   Specimen: BLOOD RIGHT HAND  Result Value Ref Range Status   Specimen Description BLOOD RIGHT HAND  Final   Special Requests   Final    BOTTLES DRAWN AEROBIC ONLY Blood Culture results may not be optimal due to an inadequate volume of blood received in culture bottles   Culture  Setup Time   Final    GRAM POSITIVE COCCI IN CLUSTERS AEROBIC BOTTLE ONLY CRITICAL RESULT CALLED TO, READ BACK BY AND VERIFIED WITH: L. SEAY,PHARMD 0211 11/05/2020 Mena Goes Performed at [facility], [place] [address] [place], [place] [address]    Culture GRAM POSITIVE COCCI  Final   Report Status PENDING  Incomplete  Culture, blood (routine x 2)     Status: None (Preliminary result)   Collection Time: 11/04/20  6:00 AM   Specimen: BLOOD RIGHT HAND  Result Value Ref Range  Status   Specimen Description BLOOD RIGHT HAND  Final   Special Requests   Final    BOTTLES DRAWN AEROBIC ONLY Blood Culture adequate volume   Culture   Final    NO GROWTH 1 DAY Performed at [facility], [place] [address], [place], [place] [address]    Report Status PENDING  Incomplete         Radiology Studies: No results found.      Scheduled Meds:  Chlorhexidine Gluconate Cloth  6 each Topical Daily   feeding supplement (GLUCERNA SHAKE)  237 mL Oral TID BM   insulin aspart  0-15 Units Subcutaneous TID WC   insulin aspart  10 Units Subcutaneous TID WC   insulin glargine-yfgn  35 Units Subcutaneous Daily   insulin starter kit- pen needles  1 kit Other Once   living well with diabetes book   Does not apply Once   multivitamin with minerals  1 tablet Oral Daily   mupirocin ointment   Nasal BID   Continuous Infusions:  vancomycin 1,250 mg (11/05/20 0507)     LOS: 5 days     Cordelia Poche, MD [facility] 11/05/2020, 3:14 PM  If 7PM-7AM, please contact night-coverage [email or web address]

## 2020-11-06 ENCOUNTER — Inpatient Hospital Stay (HOSPITAL_COMMUNITY): Payer: Medicaid Other

## 2020-11-06 DIAGNOSIS — I2690 Septic pulmonary embolism without acute cor pulmonale: Secondary | ICD-10-CM

## 2020-11-06 DIAGNOSIS — M7989 Other specified soft tissue disorders: Secondary | ICD-10-CM | POA: Diagnosis not present

## 2020-11-06 DIAGNOSIS — M79629 Pain in unspecified upper arm: Secondary | ICD-10-CM | POA: Diagnosis not present

## 2020-11-06 DIAGNOSIS — I2699 Other pulmonary embolism without acute cor pulmonale: Secondary | ICD-10-CM

## 2020-11-06 LAB — CBC
HCT: 18.5 % — ABNORMAL LOW (ref 36.0–46.0)
HCT: 19.4 % — ABNORMAL LOW (ref 36.0–46.0)
HCT: 21.4 % — ABNORMAL LOW (ref 36.0–46.0)
Hemoglobin: 6.2 g/dL — CL (ref 12.0–15.0)
Hemoglobin: 6.6 g/dL — CL (ref 12.0–15.0)
Hemoglobin: 7.4 g/dL — ABNORMAL LOW (ref 12.0–15.0)
MCH: 25.6 pg — ABNORMAL LOW (ref 26.0–34.0)
MCH: 26 pg (ref 26.0–34.0)
MCH: 27 pg (ref 26.0–34.0)
MCHC: 33.5 g/dL (ref 30.0–36.0)
MCHC: 34 g/dL (ref 30.0–36.0)
MCHC: 34.6 g/dL (ref 30.0–36.0)
MCV: 76.4 fL — ABNORMAL LOW (ref 80.0–100.0)
MCV: 76.4 fL — ABNORMAL LOW (ref 80.0–100.0)
MCV: 78.1 fL — ABNORMAL LOW (ref 80.0–100.0)
Platelets: 340 10*3/uL (ref 150–400)
Platelets: 368 10*3/uL (ref 150–400)
Platelets: 406 10*3/uL — ABNORMAL HIGH (ref 150–400)
RBC: 2.42 MIL/uL — ABNORMAL LOW (ref 3.87–5.11)
RBC: 2.54 MIL/uL — ABNORMAL LOW (ref 3.87–5.11)
RBC: 2.74 MIL/uL — ABNORMAL LOW (ref 3.87–5.11)
RDW: 13.3 % (ref 11.5–15.5)
RDW: 13.5 % (ref 11.5–15.5)
RDW: 15.1 % (ref 11.5–15.5)
WBC: 34.2 10*3/uL — ABNORMAL HIGH (ref 4.0–10.5)
WBC: 38.1 10*3/uL — ABNORMAL HIGH (ref 4.0–10.5)
WBC: 40.2 10*3/uL — ABNORMAL HIGH (ref 4.0–10.5)
nRBC: 0.1 % (ref 0.0–0.2)
nRBC: 0.2 % (ref 0.0–0.2)
nRBC: 0.3 % — ABNORMAL HIGH (ref 0.0–0.2)

## 2020-11-06 LAB — CULTURE, BLOOD (ROUTINE X 2)
Culture: NO GROWTH
Culture: NO GROWTH
Special Requests: ADEQUATE
Special Requests: ADEQUATE

## 2020-11-06 LAB — DIFFERENTIAL
Abs Immature Granulocytes: 0 10*3/uL (ref 0.00–0.07)
Basophils Absolute: 0 10*3/uL (ref 0.0–0.1)
Basophils Relative: 0 %
Eosinophils Absolute: 0.4 10*3/uL (ref 0.0–0.5)
Eosinophils Relative: 1 %
Lymphocytes Relative: 8 %
Lymphs Abs: 3 10*3/uL (ref 0.7–4.0)
Monocytes Absolute: 1.5 10*3/uL — ABNORMAL HIGH (ref 0.1–1.0)
Monocytes Relative: 4 %
Neutro Abs: 33.1 10*3/uL — ABNORMAL HIGH (ref 1.7–7.7)
Neutrophils Relative %: 87 %
nRBC: 0 /100 WBC

## 2020-11-06 LAB — BASIC METABOLIC PANEL
Anion gap: 6 (ref 5–15)
BUN: <5 mg/dL — ABNORMAL LOW (ref 6–20)
CO2: 30 mmol/L (ref 22–32)
Calcium: 7.3 mg/dL — ABNORMAL LOW (ref 8.9–10.3)
Chloride: 94 mmol/L — ABNORMAL LOW (ref 98–111)
Creatinine, Ser: 0.41 mg/dL — ABNORMAL LOW (ref 0.44–1.00)
GFR, Estimated: >60 mL/min (ref >60)
Glucose, Bld: 139 mg/dL — ABNORMAL HIGH (ref 70–99)
Potassium: 4.2 mmol/L (ref 3.5–5.1)
Sodium: 130 mmol/L — ABNORMAL LOW (ref 135–145)

## 2020-11-06 LAB — GLUCOSE, CAPILLARY
Glucose-Capillary: 174 mg/dL — ABNORMAL HIGH (ref 70–99)
Glucose-Capillary: 144 mg/dL — ABNORMAL HIGH (ref 70–99)
Glucose-Capillary: 139 mg/dL — ABNORMAL HIGH (ref 70–99)
Glucose-Capillary: 95 mg/dL (ref 70–99)

## 2020-11-06 LAB — PREPARE RBC (CROSSMATCH)

## 2020-11-06 LAB — VANCOMYCIN, TROUGH: Vancomycin Tr: 24 ug/mL (ref 15–20)

## 2020-11-06 LAB — VANCOMYCIN, PEAK: Vancomycin Pk: 38 ug/mL (ref 30–40)

## 2020-11-06 LAB — HEPARIN LEVEL (UNFRACTIONATED): Heparin Unfractionated: <0.1 IU/mL — ABNORMAL LOW (ref 0.30–0.70)

## 2020-11-06 IMAGING — DX DG ABD PORTABLE 1V
1 series · 1 of 1 positions shown · non-contrast
Comparison: [DATE] CT

CLINICAL DATA: Abdominal pain and distension.

EXAM:
PORTABLE ABDOMEN - 1 VIEW

[abdomen supine]
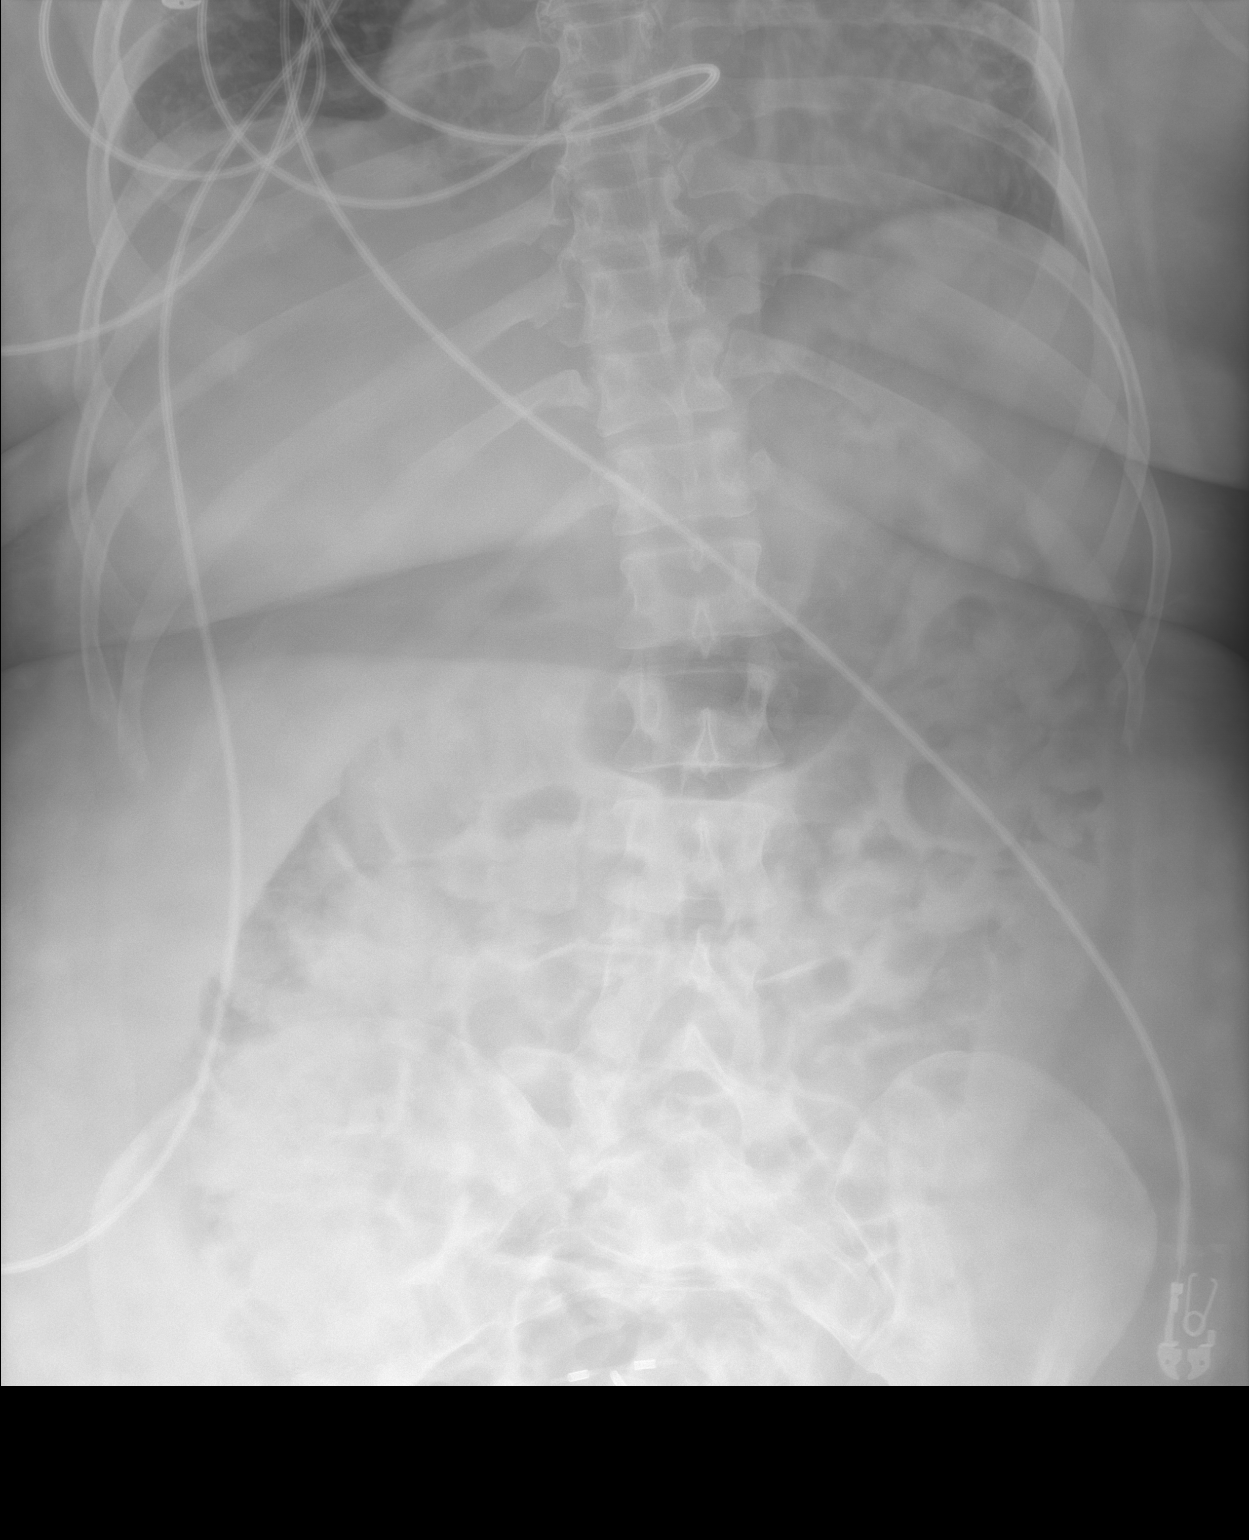

[1 of 1 positions shown; findings below may reference images not displayed]

FINDINGS: Nondistended gas-filled loops of small bowel and colon are noted. No
dilated bowel loops are present.

No suspicious calcifications are identified.

An IUD is noted.

No acute bony abnormalities are present.
IMPRESSION: Nonspecific nonobstructive bowel gas pattern.

## 2020-11-06 MED ORDER — HYDROMORPHONE HCL 1 MG/ML IJ SOLN
0.5000 mg | INTRAMUSCULAR | Status: DC | PRN
  Administered 2020-11-06 – 2020-11-11 (×14): 0.5 mg via INTRAVENOUS
  Filled 2020-11-06 (×14): qty 1

## 2020-11-06 MED ORDER — SODIUM CHLORIDE 0.9% IV SOLUTION: Freq: Once | INTRAVENOUS | Status: AC

## 2020-11-06 MED ORDER — HEPARIN BOLUS VIA INFUSION
3000.0000 [IU] | Freq: Once | INTRAVENOUS | Status: AC
  Administered 2020-11-06: 3000 [IU] via INTRAVENOUS
  Filled 2020-11-06: qty 3000

## 2020-11-06 MED ORDER — POLYETHYLENE GLYCOL 3350 17 G PO PACK
17.0000 g | PACK | Freq: Every day | ORAL | Status: DC
  Administered 2020-11-07: 17 g via ORAL
  Filled 2020-11-06: qty 1

## 2020-11-06 MED ORDER — METHOCARBAMOL 500 MG PO TABS
500.0000 mg | ORAL_TABLET | Freq: Three times a day (TID) | ORAL | Status: DC | PRN
  Administered 2020-11-08 – 2020-11-11 (×5): 500 mg via ORAL
  Filled 2020-11-06 (×5): qty 1

## 2020-11-06 MED ORDER — DOCUSATE SODIUM 100 MG PO CAPS
100.0000 mg | ORAL_CAPSULE | Freq: Two times a day (BID) | ORAL | Status: DC
  Administered 2020-11-06 – 2020-11-08 (×5): 100 mg via ORAL
  Filled 2020-11-06 (×5): qty 1

## 2020-11-06 NOTE — Progress Notes (Signed)
OT Cancellation Note  Patient Details Name: Chelsea Mathis MRN: [id number] DOB: 12-07-1990   Cancelled Treatment:    Reason Eval/Treat Not Completed: Medical issues which prohibited therapy. Pt hemoglobin at 6.2 this morning, when checked later in the day, it has only improved to 6.6. Acute OT will hold today and follow up as time allows.   Natanya Holecek H., OTR/L Acute Rehabilitation  Kobe Jansma Elane Bing Plume 11/06/2020, 3:30 PM

## 2020-11-06 NOTE — Progress Notes (Signed)
ID Brief Note  8/5 blood cx 1/2 sets GPC in clusters Blood cultures *2 ordered on 8/6 Seen by Orthopedics, no plans for surgical intervention  Continue to monitor closely for worsening pain in the bilateral thighs/need to re-image Monitor CBC, BMP, Vancomycin trough and blood cultures   Lorenzo Pereyra, MD Infectious Disease Physician [place]  [facility] [address] [place], [place] [address] Phone: [phone]  Fax: [phone]  

## 2020-11-06 NOTE — Progress Notes (Signed)
VASCULAR LAB    Bilateral upper and lower extremity venous duplex performed.  See CV proc for preliminary results.   Edita Weyenberg, RVT 11/06/2020, 11:40 AM

## 2020-11-06 NOTE — Progress Notes (Signed)
PROGRESS NOTE    Soundra Mathis  OJJ:[id number] DOB: [birth year] DOA: 10/31/2020 PCP: Pcp, No   Brief Narrative: Chelsea Mathis is a 30 y.o. female with a history of diabetes mellitus type 2.  Patient presented secondary to elevated blood sugar, weakness, fatigue with evidence of sepsis.  She started on empiric biotics with blood cultures significant for Staph aureus.  She is also found to have a septic pulmonary emboli on CT imaging concerning for possible endocarditis.   Assessment & Plan:   Principal Problem:   Severe sepsis ([place]) Active Problems:   DKA (diabetic ketoacidosis) ([place])   Bacteremia due to Staphylococcus aureus   Septic pulmonary embolism (H[facility])   Type 2 diabetes mellitus with hypoglycemia without coma ([place])   Myositis   Myofasciitis   Severe sepsis Present on admission. Patient admitted to ICU, but no need for vasopressor support. Cavitary lung lesions noted on CTA chest. Empirically started on Vancomycin/Clindamycin/Zosyn. Blood culture significant for staphylococcus aureus infection and transitioned to Vancomycin IV monotherapy. Leukocytosis continues to climb.  Staphylococcus bacteremia Cavitary lung lesions Infectious disease consulted. Empiric antibiotics as above and now transitioned to Vancomycin IV monotherapy. Repeat chest x-ray slightly worsened, however, symptoms stable. 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 Repeat BCx (8/2) no growth however BCx (8/5) positive for GPCs in clusters.  -ID recommendations: Vancomycin IV, overall 6 week plan for antibiotics -Follow-up Blood cultures (8/5 and 8/6)  Acute pulmonary embolism Diagnosed 8/6. Likely related to infective source rather than blood clot in setting of endocarditis. Started on heparin IV with bolus overnight on 8/7 with resultant acute on chronic anemia. -Check venous duplex of upper and lower extremities -Stop heparin IV in setting of likely infective  emboli and acute anemia  DKA In setting of infection and recent prednisone use. Patient presented with elevated glucose, anion gap beta-hydroxybutyric acid and low bicarbonate level. Patient started on insulin drip with rapid improvement in anion gap, CO2 and blood sugars. Transitioned off of insulin drip and to Lantus. Resolved.  Tachycardia In setting of acute illness. Fever last night. Sinus rhythm. Given IV fluids. Continues to be slightly tachycardic but stable. In light of worsened right sided chest pain and continued hypoxia (in setting of septic emboli), may need to consider PE. CTA chest significant for filling defect in lobar/segmental aspects of right lower lobe suggesting PE.  Acute on chronic anemia In setting of thrombocytopenia while on her menstrual cycle. Had been stable around 7.3-7.4 but now acutely dropped after heparin. No source of bleeding identified at this time. Patient's menstrual cycle has ceased. 1 unit of PRBC ordered morning of 8/7. -Post transfusion H&H  Thrombocytopenia Likely secondary to acute infection. Resolved.  Diabetes mellitus, type 2 History of diabetes; recently started on steroids. Patient's hemoglobin A1C is 12.3%. -Continue Semglee 35 units daily and Novolog 10 units TID with meals. Continue SSI with meals -Continue carb modified diet  Hyponatremia Severe. Sodium of 103 on admission, although repeat sodium 20 min later of 120. Rapid increase with control of blood sugar. Most likely mainly secondary to pseudohyponatremia from hyperglycemia. Improved slightly and seems stable  AKI In setting of DKA and likely accompanying dehydration. Resolved with IV fluids.  Atrial fibrillation Appears to be transient. Started on Amiodarone with bolus and drip. Currently in sinus rhythm.  Myositis/myofasciitis Secondary to infection. CK elevated on admission but repeat is normal. Aldolase is elevated. RF is mildly elevated and ANA is negative. Initial imaging  without any specific findings. MRI of bilateral  thighs significant for evidence of myositis/myofasciitis. Orthopedic surgery consulted and recommend no surgical intervention. Overall, symptoms are improving -Percocet prn  Abdominal pain Patient states pain has always been there but has worsened. Seems out of proportion. No associated bowel movements/hematochezia/melena. In setting of known septic emboli, however. No bowel movement since admission. -Abdominal x-ray to rule out constipation   DVT prophylaxis: SCDs Code Status:   Code Status: Full Code Family Communication: None at bedside Disposition Plan: Discharge likely home in 3-5 days pending continued workup/management of bacteremia, improvement of sepsis, myalgias   Consultants:  PCCM Infectious disease Orthopedic surgery  Procedures:  TRANSTHORACIC ECHOCARDIOGRAM (11/01/2020) IMPRESSIONS     1. Left ventricular ejection fraction, by estimation, is 70 to 75%. The  left ventricle has hyperdynamic function. The left ventricle has no  regional wall motion abnormalities. Indeterminate diastolic filling due to  E-A fusion.   2. Right ventricular systolic function is normal. The right ventricular  size is normal.   3. The mitral valve is normal in structure. No evidence of mitral valve  regurgitation. No evidence of mitral stenosis.   4. The aortic valve is normal in structure. Aortic valve regurgitation is  not visualized. No aortic stenosis is present.   5. The inferior vena cava is normal in size with greater than 50%  respiratory variability, suggesting right atrial pressure of 3 mmHg.   Conclusion(s)/Recommendation(s): No evidence of valvular vegetations on  this transthoracic echocardiogram. Would recommend a transesophageal  echocardiogram to exclude infective endocarditis if clinically indicated  Antimicrobials: Vancomycin IV Zosyn IV Clindamycin IV    Subjective: Some right sided abdominal pain which has  worsened.   Objective: Vitals:   11/05/20 1853 11/05/20 2047 11/06/20 0200 11/06/20 0506  BP:  (!) 113/54 (!) 98/59 (!) 94/46  Pulse: (!) 121 (!) 128 (!) 110 (!) 114  Resp:  16 (!) 21 (!) 48  Temp:  99.2 F (37.3 C) 98.3 F (36.8 C) 98.2 F (36.8 C)  TempSrc:  Oral Oral Oral  SpO2: 98%  98% 97%  Weight:      Height:        Intake/Output Summary (Last 24 hours) at 11/06/2020 0734 Last data filed at 11/06/2020 0200 Gross per 24 hour  Intake 240 ml  Output 1800 ml  Net -1560 ml    Filed Weights   11/03/20 0358 11/03/20 1336 11/04/20 0410  Weight: 98 kg 98 kg 101.5 kg    Examination:  General exam: Appears slightly distressed and in significant pain. Conversant Respiratory: Diminished but mostly clear to auscultation. Respiratory effort increased with tachypnea but no significant intercostal retractions and slight use of accessory muscles Cardiovascular: S1 & S2 heard, RRR. No murmurs, rubs, gallops or clicks. No pitting edema Gastrointestinal: Abdomen is distended, soft and generally tender with light touch. No masses felt. Normal bowel sounds heard Neurologic: No focal neurological deficits Musculoskeletal: Bilateral forearms are swollen with soft compartments but are tender. Skin: No cyanosis. No new rashes Psychiatry: Alert and oriented. Memory intact. Mood & affect appropriate   Data Reviewed: I have personally reviewed following labs and imaging studies  CBC Lab Results  Component Value Date   WBC 34.2 (H) 11/06/2020   RBC 2.42 (L) 11/06/2020   HGB 6.2 (LL) 11/06/2020   HCT 18.5 (L) 11/06/2020   MCV 76.4 (L) 11/06/2020   MCH 25.6 (L) 11/06/2020   PLT 340 11/06/2020   MCHC 33.5 11/06/2020   RDW 13.3 11/06/2020   LYMPHSABS 1.7 10/31/2020   MONOABS 0.0 (  L) 10/31/2020   EOSABS 0.0 10/31/2020   BASOSABS 0.0 51/76/1607     Last metabolic panel Lab Results  Component Value Date   NA 130 (L) 11/06/2020   K 4.2 11/06/2020   CL 94 (L) 11/06/2020   CO2 30  11/06/2020   BUN <5 (L) 11/06/2020   CREATININE 0.41 (L) 11/06/2020   GLUCOSE 139 (H) 11/06/2020   GFRNONAA >60 11/06/2020   GFRAA >60 11/10/2016   CALCIUM 7.3 (L) 11/06/2020   PHOS 3.5 11/03/2020   PROT 5.4 (L) 11/01/2020   ALBUMIN 1.5 (L) 11/01/2020   BILITOT 0.9 11/01/2020   ALKPHOS 106 11/01/2020   AST 55 (H) 11/01/2020   ALT 21 11/01/2020   ANIONGAP 6 11/06/2020    CBG (last 3)  Recent Labs    11/05/20 1516 11/05/20 1750 11/05/20 2126  GLUCAP 147* 147* 220*      GFR: Estimated Creatinine Clearance: 117 mL/min (A) (by C-G formula based on SCr of 0.41 mg/dL (L)).  Coagulation Profile: Recent Labs  Lab 10/31/20 1109 10/31/20 2056  INR 1.4* 1.3*     Recent Results (from the past 240 hour(s))  Resp Panel by RT-PCR (Flu A&B, Covid) Nasopharyngeal Swab     Status: None   Collection Time: 10/31/20 10:53 AM   Specimen: Nasopharyngeal Swab; Nasopharyngeal(NP) swabs in vial transport medium  Result Value Ref Range Status   SARS Coronavirus 2 by RT PCR NEGATIVE NEGATIVE Final    Comment: (NOTE) SARS-CoV-2 target nucleic acids are NOT DETECTED.  The SARS-CoV-2 RNA is generally detectable in upper respiratory specimens during the acute phase of infection. The lowest concentration of SARS-CoV-2 viral copies this assay can detect is 138 copies/mL. A negative result does not preclude SARS-Cov-2 infection and should not be used as the sole basis for treatment or other patient management decisions. A negative result may occur with  improper specimen collection/handling, submission of specimen other than nasopharyngeal swab, presence of viral mutation(s) within the areas targeted by this assay, and inadequate number of viral copies(<138 copies/mL). A negative result must be combined with clinical observations, patient history, and epidemiological information. The expected result is Negative.  Fact Sheet for Patients:  EntrepreneurPulse.com.au  Fact  Sheet for Healthcare Providers:  IncredibleEmployment.be  This test is no t yet approved or cleared by the [place] FDA and  has been authorized for detection and/or diagnosis of SARS-CoV-2 by FDA under an Emergency Use Authorization (EUA). This EUA will remain  in effect (meaning this test can be used) for the duration of the COVID-19 declaration under Section 564(b)(1) of the Act, 21 U.S.C.section 360bbb-3(b)(1), unless the authorization is terminated  or revoked sooner.       Influenza A by PCR NEGATIVE NEGATIVE Final   Influenza B by PCR NEGATIVE NEGATIVE Final    Comment: (NOTE) The Xpert Xpress SARS-CoV-2/FLU/RSV plus assay is intended as an aid in the diagnosis of influenza from Nasopharyngeal swab specimens and should not be used as a sole basis for treatment. Nasal washings and aspirates are unacceptable for Xpert Xpress SARS-CoV-2/FLU/RSV testing.  Fact Sheet for Patients: EntrepreneurPulse.com.au  Fact Sheet for Healthcare Providers: IncredibleEmployment.be  This test is not yet approved or cleared by the [place] FDA and has been authorized for detection and/or diagnosis of SARS-CoV-2 by FDA under an Emergency Use Authorization (EUA). This EUA will remain in effect (meaning this test can be used) for the duration of the COVID-19 declaration under Section 564(b)(1) of the Act, 21 U.S.C. section 360bbb-3(b)(1), unless  the authorization is terminated or revoked.  Performed at [facility], [place] [address], [place], [place] [address]   Blood culture (routine single)     Status: Abnormal   Collection Time: 10/31/20 11:00 AM   Specimen: BLOOD LEFT FOREARM  Result Value Ref Range Status   Specimen Description BLOOD LEFT FOREARM  Final   Special Requests   Final    BOTTLES DRAWN AEROBIC AND ANAEROBIC Blood Culture adequate volume   Culture  Setup Time   Final    GRAM POSITIVE COCCI IN CLUSTERS IN  BOTH AEROBIC AND ANAEROBIC BOTTLES Organism ID to follow CRITICAL RESULT CALLED TO, READ BACK BY AND VERIFIED WITH: JJerrilyn Cairo PHARMD, AT 0221 11/01/20 Rush Landmark Performed at [facility], [place] [address], [place], [place] [address]    Culture METHICILLIN RESISTANT STAPHYLOCOCCUS AUREUS (A)  Final   Report Status 11/03/2020 FINAL  Final   Organism ID, Bacteria METHICILLIN RESISTANT STAPHYLOCOCCUS AUREUS  Final      Susceptibility   Methicillin resistant staphylococcus aureus - MIC*    CIPROFLOXACIN >=8 RESISTANT Resistant     ERYTHROMYCIN >=8 RESISTANT Resistant     GENTAMICIN <=0.5 SENSITIVE Sensitive     OXACILLIN >=4 RESISTANT Resistant     TETRACYCLINE <=1 SENSITIVE Sensitive     VANCOMYCIN <=0.5 SENSITIVE Sensitive     TRIMETH/SULFA <=10 SENSITIVE Sensitive     CLINDAMYCIN <=0.25 SENSITIVE Sensitive     RIFAMPIN <=0.5 SENSITIVE Sensitive     Inducible Clindamycin NEGATIVE Sensitive     * METHICILLIN RESISTANT STAPHYLOCOCCUS AUREUS  Blood Culture ID Panel (Reflexed)     Status: Abnormal   Collection Time: 10/31/20 11:00 AM  Result Value Ref Range Status   Enterococcus faecalis NOT DETECTED NOT DETECTED Final   Enterococcus Faecium NOT DETECTED NOT DETECTED Final   Listeria monocytogenes NOT DETECTED NOT DETECTED Final   Staphylococcus species DETECTED (A) NOT DETECTED Final    Comment: CRITICAL RESULT CALLED TO, READ BACK BY AND VERIFIED WITH: J. LEDFORD PHARMD, AT 0221 11/01/20 D. VANHOOK    Staphylococcus aureus (BCID) DETECTED (A) NOT DETECTED Final    Comment: Methicillin (oxacillin)-resistant Staphylococcus aureus (MRSA). MRSA is predictably resistant to beta-lactam antibiotics (except ceftaroline). Preferred therapy is vancomycin unless clinically contraindicated. Patient requires contact precautions if  hospitalized. CRITICAL RESULT CALLED TO, READ BACK BY AND VERIFIED WITH: J. LEDFORD PHARMD, AT 0221 11/01/20 D. VANHOOK    Staphylococcus epidermidis NOT DETECTED NOT  DETECTED Final   Staphylococcus lugdunensis NOT DETECTED NOT DETECTED Final   Streptococcus species NOT DETECTED NOT DETECTED Final   Streptococcus agalactiae NOT DETECTED NOT DETECTED Final   Streptococcus pneumoniae NOT DETECTED NOT DETECTED Final   Streptococcus pyogenes NOT DETECTED NOT DETECTED Final   A.calcoaceticus-baumannii NOT DETECTED NOT DETECTED Final   Bacteroides fragilis NOT DETECTED NOT DETECTED Final   Enterobacterales NOT DETECTED NOT DETECTED Final   Enterobacter cloacae complex NOT DETECTED NOT DETECTED Final   Escherichia coli NOT DETECTED NOT DETECTED Final   Klebsiella aerogenes NOT DETECTED NOT DETECTED Final   Klebsiella oxytoca NOT DETECTED NOT DETECTED Final   Klebsiella pneumoniae NOT DETECTED NOT DETECTED Final   Proteus species NOT DETECTED NOT DETECTED Final   Salmonella species NOT DETECTED NOT DETECTED Final   Serratia marcescens NOT DETECTED NOT DETECTED Final   Haemophilus influenzae NOT DETECTED NOT DETECTED Final   Neisseria meningitidis NOT DETECTED NOT DETECTED Final   Pseudomonas aeruginosa NOT DETECTED NOT DETECTED Final   Stenotrophomonas maltophilia NOT  DETECTED NOT DETECTED Final   Candida albicans NOT DETECTED NOT DETECTED Final   Candida auris NOT DETECTED NOT DETECTED Final   Candida glabrata NOT DETECTED NOT DETECTED Final   Candida krusei NOT DETECTED NOT DETECTED Final   Candida parapsilosis NOT DETECTED NOT DETECTED Final   Candida tropicalis NOT DETECTED NOT DETECTED Final   Cryptococcus neoformans/gattii NOT DETECTED NOT DETECTED Final   Meth resistant mecA/C and MREJ DETECTED (A) NOT DETECTED Final    Comment: CRITICAL RESULT CALLED TO, READ BACK BY AND VERIFIED WITH: J. LEDFORD PHARMD, AT 0221 11/01/20 Rush Landmark Performed at [place] [address], [place], [place] [address]   Urine Culture     Status: Abnormal   Collection Time: 10/31/20  1:29 PM   Specimen: In/Out Cath Urine  Result Value Ref Range Status    Specimen Description IN/OUT CATH URINE  Final   Special Requests   Final    NONE Performed at [facility], [place] [address] [place], [place] [address]    Culture MULTIPLE SPECIES PRESENT, SUGGEST RECOLLECTION (A)  Final   Report Status 11/01/2020 FINAL  Final  MRSA Next Gen by PCR, Nasal     Status: Abnormal   Collection Time: 10/31/20  7:50 PM   Specimen: Nasal Mucosa; Nasal Swab  Result Value Ref Range Status   MRSA by PCR Next Gen DETECTED (A) NOT DETECTED Final    Comment: RESULT CALLED TO, READ BACK BY AND VERIFIED WITH: WARNER,B RN AT 2144 10/31/2020 MITCHELL,L (NOTE) The GeneXpert MRSA Assay (FDA approved for NASAL specimens only), is one component of a comprehensive MRSA colonization surveillance program. It is not intended to diagnose MRSA infection nor to guide or monitor treatment for MRSA infections. Test performance is not FDA approved in patients less than 23 years old. Performed at [facility], [place] [address], [place], [place] [address]   Culture, blood (routine x 2)     Status: None (Preliminary result)   Collection Time: 11/01/20 10:22 AM   Specimen: BLOOD RIGHT FOREARM  Result Value Ref Range Status   Specimen Description BLOOD RIGHT FOREARM  Final   Special Requests   Final    BOTTLES DRAWN AEROBIC AND ANAEROBIC Blood Culture adequate volume   Culture   Final    NO GROWTH 4 DAYS Performed at [facility], [place] [address] [place], [place] [address]    Report Status PENDING  Incomplete  Culture, blood (routine x 2)     Status: None (Preliminary result)   Collection Time: 11/01/20 10:29 AM   Specimen: BLOOD RIGHT HAND  Result Value Ref Range Status   Specimen Description BLOOD RIGHT HAND  Final   Special Requests   Final    BOTTLES DRAWN AEROBIC AND ANAEROBIC Blood Culture adequate volume   Culture   Final    NO GROWTH 4 DAYS Performed at [facility], [place] [address], [place],  [address]    Report Status PENDING  Incomplete   Culture, blood (routine x 2)     Status: None (Preliminary result)   Collection Time: 11/04/20  5:50 AM   Specimen: BLOOD RIGHT HAND  Result Value Ref Range Status   Specimen Description BLOOD RIGHT HAND  Final   Special Requests   Final    BOTTLES DRAWN AEROBIC ONLY Blood Culture results may not be optimal due to an inadequate volume of blood received in culture bottles   Culture  Setup Time  Final    GRAM POSITIVE COCCI IN CLUSTERS AEROBIC BOTTLE ONLY CRITICAL RESULT CALLED TO, READ BACK BY AND VERIFIED WITH: L. SEAY,PHARMD 0211 11/05/2020 Mena Goes Performed at [facility], [place] [address], [place], [place] [address]    Culture GRAM POSITIVE COCCI  Final   Report Status PENDING  Incomplete  Culture, blood (routine x 2)     Status: None (Preliminary result)   Collection Time: 11/04/20  6:00 AM   Specimen: BLOOD RIGHT HAND  Result Value Ref Range Status   Specimen Description BLOOD RIGHT HAND  Final   Special Requests   Final    BOTTLES DRAWN AEROBIC ONLY Blood Culture adequate volume   Culture   Final    NO GROWTH 1 DAY Performed at [facility], [place] [address], [place], [place] [address]    Report Status PENDING  Incomplete         Radiology Studies: CT Angio Chest Pulmonary Embolism (PE) W or WO Contrast  Addendum Date: 11/05/2020   ADDENDUM REPORT: 11/05/2020 23:03 ADDENDUM: Critical Value/emergent results were called by telephone at the time of physician contact on 11/05/2020 at 11:03 pm to provider Dr Orlin Hilding, who verbally acknowledged these results. Electronically Signed   By: Lovena Le M.D.   On: 11/05/2020 23:03   Result Date: 11/05/2020 CLINICAL DATA:  Chest pain, shortness of breath EXAM: CT ANGIOGRAPHY CHEST WITH CONTRAST TECHNIQUE: Multidetector CT imaging of the chest was performed using the standard protocol during bolus administration of intravenous contrast. Multiplanar CT image reconstructions and MIPs were obtained to evaluate the vascular anatomy.  CONTRAST:  52m OMNIPAQUE IOHEXOL 350 MG/ML SOLN COMPARISON:  CT 10/31/2020 FINDINGS: Cardiovascular: Suboptimal contrast opacification of the pulmonary arteries with a central pulmonary artery contrast bolus measuring 186 mean Hounsfield units. Despite the suboptimal opacification, there is demonstrable pulmonary artery embolism seen in the right lower lobar and segmental branches (5/74). No other large central or lobar filling defects are identified within the limitations of this exam. Central pulmonary arteries are top-normal caliber. Elevation of the RV/LV ratio to approximately 1.5. No pericardial effusion. Cardiac size top normal. The aortic root is suboptimally assessed given cardiac pulsation artifact. The aorta is normal caliber. No acute luminal abnormality of the imaged aorta. No periaortic stranding or hemorrhage. Shared origin of the brachiocephalic and left common carotid arteries. Proximal great vessels are otherwise unremarkable. Mediastinum/Nodes: No mediastinal fluid or gas. Normal thyroid gland and thoracic inlet. No acute abnormality of the trachea or esophagus. No worrisome mediastinal, hilar or axillary adenopathy. Lungs/Pleura: Redemonstration and increasing number of numerous regions of masslike consolidation with some increasing central cavitation throughout both lungs as well as more confluent coalescent consolidation and interspersed regions of ground-glass opacity in the right base. Focal volume loss with heterogeneous enhancement of the underlying parenchyma in the right lower lobe as well. Small right pleural effusion. Trace left pleural fluid. No pneumothorax. Upper Abdomen: No acute abnormalities present in the visualized portions of the upper abdomen. Musculoskeletal: No acute osseous abnormality or suspicious osseous lesion. Review of the MIP images confirms the above findings. IMPRESSION: 1. Technically limited exam due to respiratory motion artifact and suboptimal contrast  opacification. 2. Demonstrable lobar and segmental filling defects seen in the right lower lobe. Elevation of the RV/LV ratio and central pulmonary artery enlargement compatible with some degree of right heart strain. 3. Additional multifocal solid and cavitary nodular opacities throughout the lungs with an appearance and distribution suggestive of septic pulmonary emboli versus  atypical infection. 4. Increasing consolidative opacity in the right lung base as well as heterogeneously enhancing atelectatic lung in the right lower lobe. Could reflect a combination of atelectasis, infectious consolidation and pulmonary infarct in the distribution of filling defect described above. Small right pleural effusion and trace left effusion as well. Currently attempting to contact the ordering provider with a critical value result. Addendum will be submitted upon case discussion. Electronically Signed: By: Lovena Le M.D. On: 11/05/2020 22:53        Scheduled Meds:  sodium chloride   Intravenous Once   Chlorhexidine Gluconate Cloth  6 each Topical Daily   feeding supplement (GLUCERNA SHAKE)  237 mL Oral TID BM   insulin aspart  0-15 Units Subcutaneous TID WC   insulin aspart  10 Units Subcutaneous TID WC   insulin glargine-yfgn  35 Units Subcutaneous Daily   insulin starter kit- pen needles  1 kit Other Once   living well with diabetes book   Does not apply Once   multivitamin with minerals  1 tablet Oral Daily   mupirocin ointment   Nasal BID   Continuous Infusions:  ceFEPime (MAXIPIME) IV 2 g (11/05/20 2345)   vancomycin 1,250 mg (11/06/20 0509)     LOS: 6 days     Cordelia Poche, MD [facility] 11/06/2020, 7:34 AM  If 7PM-7AM, please contact night-coverage [email or web address]

## 2020-11-06 NOTE — Plan of Care (Signed)
  Problem: Education: Goal: Ability to describe self-care measures that may prevent or decrease complications (Diabetes Survival Skills Education) will improve Outcome: Progressing Goal: Individualized Educational Video(s) Outcome: Progressing   Problem: Metabolic: Goal: Ability to maintain appropriate glucose levels will improve Outcome: Progressing   Problem: Education: Goal: Knowledge of General Education information will improve Description: Including pain rating scale, medication(s)/side effects and non-pharmacologic comfort measures Outcome: Progressing   Problem: Health Behavior/Discharge Planning: Goal: Ability to manage health-related needs will improve Outcome: Progressing   Problem: Clinical Measurements: Goal: Ability to maintain clinical measurements within normal limits will improve Outcome: Progressing Goal: Will remain free from infection Outcome: Progressing Goal: Diagnostic test results will improve Outcome: Progressing Goal: Respiratory complications will improve Outcome: Progressing Goal: Cardiovascular complication will be avoided Outcome: Progressing   Problem: Activity: Goal: Risk for activity intolerance will decrease Outcome: Progressing   Problem: Nutrition: Goal: Adequate nutrition will be maintained Outcome: Progressing   Problem: Coping: Goal: Level of anxiety will decrease Outcome: Progressing   Problem: Elimination: Goal: Will not experience complications related to bowel motility Outcome: Progressing Goal: Will not experience complications related to urinary retention Outcome: Progressing   Problem: Pain Managment: Goal: General experience of comfort will improve Outcome: Progressing   Problem: Safety: Goal: Ability to remain free from injury will improve Outcome: Progressing   Problem: Skin Integrity: Goal: Risk for impaired skin integrity will decrease Outcome: Progressing   

## 2020-11-06 NOTE — Progress Notes (Signed)
Critical hemoglobin of 6.2.  MD on call notified

## 2020-11-07 ENCOUNTER — Inpatient Hospital Stay (HOSPITAL_COMMUNITY): Payer: Medicaid Other

## 2020-11-07 DIAGNOSIS — A419 Sepsis, unspecified organism: Secondary | ICD-10-CM | POA: Diagnosis not present

## 2020-11-07 DIAGNOSIS — I2602 Saddle embolus of pulmonary artery with acute cor pulmonale: Secondary | ICD-10-CM | POA: Diagnosis not present

## 2020-11-07 DIAGNOSIS — R6520 Severe sepsis without septic shock: Secondary | ICD-10-CM | POA: Diagnosis not present

## 2020-11-07 LAB — CULTURE, BLOOD (ROUTINE X 2)

## 2020-11-07 LAB — CBC
HCT: 21.1 % — ABNORMAL LOW (ref 36.0–46.0)
Hemoglobin: 7 g/dL — ABNORMAL LOW (ref 12.0–15.0)
MCH: 26.7 pg (ref 26.0–34.0)
MCHC: 33.2 g/dL (ref 30.0–36.0)
MCV: 80.5 fL (ref 80.0–100.0)
Platelets: 415 10*3/uL — ABNORMAL HIGH (ref 150–400)
RBC: 2.62 MIL/uL — ABNORMAL LOW (ref 3.87–5.11)
RDW: 15.2 % (ref 11.5–15.5)
WBC: 38.5 10*3/uL — ABNORMAL HIGH (ref 4.0–10.5)
nRBC: 0.1 % (ref 0.0–0.2)

## 2020-11-07 LAB — HEMOGLOBIN AND HEMATOCRIT, BLOOD
HCT: 20.9 % — ABNORMAL LOW (ref 36.0–46.0)
Hemoglobin: 7 g/dL — ABNORMAL LOW (ref 12.0–15.0)

## 2020-11-07 LAB — GLUCOSE, CAPILLARY
Glucose-Capillary: 133 mg/dL — ABNORMAL HIGH (ref 70–99)
Glucose-Capillary: 219 mg/dL — ABNORMAL HIGH (ref 70–99)
Glucose-Capillary: 173 mg/dL — ABNORMAL HIGH (ref 70–99)

## 2020-11-07 LAB — ECHOCARDIOGRAM LIMITED
Height: 64 in
Weight: 3587.33 oz

## 2020-11-07 LAB — VANCOMYCIN, TROUGH: Vancomycin Tr: 8 ug/mL — ABNORMAL LOW (ref 15–20)

## 2020-11-07 LAB — VANCOMYCIN, RANDOM: Vancomycin Rm: 16

## 2020-11-07 MED ORDER — VANCOMYCIN HCL 10 G IV SOLR
1750.0000 mg | Freq: Two times a day (BID) | INTRAVENOUS | Status: DC
  Administered 2020-11-07 – 2020-11-08 (×2): 1750 mg via INTRAVENOUS
  Filled 2020-11-07 (×3): qty 1750

## 2020-11-07 MED ORDER — VANCOMYCIN HCL 1250 MG/250ML IV SOLN
1750.0000 mg | Freq: Two times a day (BID) | INTRAVENOUS | Status: DC
  Filled 2020-11-07: qty 350

## 2020-11-07 NOTE — Progress Notes (Signed)
ID Brief Note  8/5 blood cx 1/2 sets GPC in clusters Blood cultures *2 ordered on 8/6 Seen by Orthopedics, no plans for surgical intervention  Continue to monitor closely for worsening pain in the bilateral thighs/need to re-image Monitor CBC, BMP, Vancomycin trough and blood cultures   Odette Fraction, MD Infectious Disease Physician [facility] [address] [place], [place] [address] Phone: [phone]  Fax: [phone]

## 2020-11-07 NOTE — Progress Notes (Signed)
OT Cancellation Note  Patient Details Name: Chelsea Mathis MRN: [id number] DOB: [birth year]   Cancelled Treatment:    Reason Eval/Treat Not Completed: Patient declined. Lunch recently delivered and patient eating at this time. OT to check back as time allows.  Kallie Edward OTR/L Supplemental OT, Department of rehab services [phone]  Lehi Phifer R H. 11/07/2020, 1:46 PM

## 2020-11-07 NOTE — Progress Notes (Signed)
Pharmacy Antibiotic Note  Chelsea Mathis is a 30 y.o. 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 Pharmacy has been consulted for vancomycin. Afebrile with WBC increasing to 38.5 and Scr stable at 0.41.   Patient has been receiving vancomycin 1250mg  IV q12h. Levels drawn after 8/7 PM dose.  -Peak drawn during infusion and returned at 38 -Trough drawn appropriately and returned at 8 -Additional random level drawn due to inaccurate peak after 8/8 AM dose, returned at 16   Current dose found to be subtherapeutic with AUC of 341.8 and Cmin of 7.    Vancomycin 1750 mg IV q12h results in estimated AUC of 476.6 and Cmin of 10.1.   Plan: Increase dose to vancomycin 1750mg  IV Q12H F/u renal fxn and peak/trough at SS.   Height: 5\' 4"  (162.6 cm) Weight: 101.7 kg (224 lb 3.3 oz) IBW/kg (Calculated) : 54.7  Temp (24hrs), Avg:99.3 F (37.4 C), Min:98.3 F (36.8 C), Max:100.9 F (38.3 C)  Recent Labs  Lab 10/31/20 1507 10/31/20 1700 10/31/20 1700 10/31/20 2056 11/01/20 0122 11/02/20 0248 11/02/20 1047 11/03/20 0302 11/04/20 0231 11/04/20 0557 11/05/20 0157 11/05/20 2015 11/06/20 0523 11/06/20 0748 11/06/20 1634 11/06/20 1907 11/07/20 0455 11/07/20 1137  WBC  --   --   --   --    < > 18.3*   < > 23.2* 26.0*  --  28.6*  --  34.2* 38.1* 40.2*  --  38.5*  --   CREATININE  --  0.93   < > 0.59   < > 0.57  --  0.60 0.54  --  0.42*  --  0.41*  --   --   --   --   --   LATICACIDVEN 4.6* 5.6*  --  4.2*  --   --   --   --   --   --   --   --   --   --   --   --   --   --   VANCOTROUGH  --   --   --   --   --   --   --   --   --    < >  --   --  24*  --   --   --  8*  --   VANCOPEAK  --   --   --   --   --   --   --   --   --   --   --  30  --   --   --  38  --   --   VANCORANDOM  --   --   --   --   --   --   --   --   --   --   --   --   --   --   --   --   --  16   < > = values in this interval not displayed.     Estimated Creatinine Clearance:  117.1 mL/min (A) (by C-G formula based on SCr of 0.41 mg/dL (L)).    No Known Allergies  Antimicrobials this admission: Vanc 8/01 >> Zosyn 8/01 > 8/02 Clinda x 1 8/01  Microbiology results: 8/02 blood: NGTD 8/01 urine cx: contaminated sample, needs redo 8/01 blood 2/2: MRSA 8/01 Resp panel: neg  10/01, PharmD Pharmacy Resident 11/07/2020, 1:35 PM

## 2020-11-07 NOTE — Progress Notes (Signed)
[facility] Infectious Diseases Follow Up Note  Patient Identification: Patient Name: Chelsea Mathis MRN: [id number] Admit Date: 10/31/2020  9:34 AM Age: 30 y.o.Today's Date: 11/07/2020   Reason for Visit: MRSA bacteremia  Principal Problem:   Severe sepsis (HCC) Active Problems:   DKA (diabetic ketoacidosis) (HCC)   Bacteremia due to Staphylococcus aureus   Septic pulmonary embolism (HCC)   Type 2 diabetes mellitus with hypoglycemia without coma (HCC)   Myositis   Myofasciitis  Antibiotics: Vancomycin-c                    clindamycin/zosyn 8/2    Lines/Tubes: PIVs    Interval Events: Febrile 102.6 yesterday, WBC trending up to 40 but started downtrending this morning at 38.5.  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  2.   Repeat blood cultures 2 sets ordered 3.   Closely monitor swelling and pain in bilateral thighs for worsening pain swelling, in which case low threshold to reimage 4.   Monitor fever and WBC trend 5.   Anticoagulation per primary 6.   Monitor CBC, BMP and Vancomycin trough  7.   Plan discussed with patient/Fiance/ID pharmacy and primary   Rest of the management as per the primary team. Thank you for the consult. Please page with pertinent questions or  concerns.  ______________________________________________________________________ Subjective patient seen and examined at the bedside.  Fianc at bedside.  Patient tells me she feels the same.  The pain in her bilateral thighs are about the same intensity as when seen in Friday.  Pain gets worse when she moves around.  She has chest pain when she takes a deep breath.  Denies any nausea vomiting abdominal pain or diarrhea.    Vitals BP 128/82 (BP Location: Right Arm)   Pulse (!) 125   Temp 98.9 F (37.2 C) (Oral)   Resp 15   Ht 5\' 4"  (1.626 m)   Wt 101.7 kg   LMP 10/31/2020   SpO2 96%   BMI 38.49 kg/m     Physical Exam Constitutional: not in acute distress, on nasal cannula  Cardiovascular:     Rate and Rhythm: Normal rate and regular rhythm.     Heart sounds:   Pulmonary:     Effort: on nasal cannula     Comments:   Abdominal:     Palpations: Abdomen is soft.     Tenderness: Nontender and nondistended  Musculoskeletal:        General: Tenderness in the right thigh/left thigh, no fluctuance or crepitus palpated ( seems to be stable)                   Skin              Comments: No lesions or rashes   Neurological:     General: No focal deficit present.   Psychiatric:        Mood and Affect: Mood normal.  Pertinent Microbiology Results for orders placed or performed during the hospital encounter of 10/31/20  Resp Panel by RT-PCR (Flu A&B, Covid) Nasopharyngeal Swab     Status: None   Collection Time: 10/31/20 10:53 AM   Specimen: Nasopharyngeal Swab; Nasopharyngeal(NP) swabs in vial transport medium  Result Value Ref Range Status   SARS Coronavirus 2 by RT PCR NEGATIVE NEGATIVE Final    Comment: (NOTE) SARS-CoV-2 target nucleic acids are NOT DETECTED.  The SARS-CoV-2 RNA is generally detectable in upper respiratory specimens during the acute phase of infection. The lowest concentration of SARS-CoV-2 viral copies this assay can detect is 138 copies/mL. A  negative result does not preclude SARS-Cov-2 infection and should not be used as the sole basis for treatment or other patient management decisions. A negative result may occur with  improper specimen collection/handling, submission of specimen other than nasopharyngeal swab, presence of viral mutation(s) within the areas targeted by this assay, and inadequate number of viral copies(<138 copies/mL). A negative result must be combined with clinical observations, patient history, and epidemiological information. The expected result is Negative.  Fact Sheet for Patients:  BloggerCourse.com  Fact Sheet for Healthcare Providers:  SeriousBroker.it  This test is no t yet approved or cleared by the [place] FDA and  has been authorized for detection and/or diagnosis of SARS-CoV-2 by FDA under an Emergency Use Authorization (EUA). This EUA will remain  in effect (meaning this test can be used) for the duration of the COVID-19 declaration under Section 564(b)(1) of the Act, 21 U.S.C.section 360bbb-3(b)(1), unless the authorization is terminated  or revoked sooner.       Influenza A by PCR NEGATIVE NEGATIVE Final   Influenza B by PCR NEGATIVE NEGATIVE Final    Comment: (NOTE) The Xpert Xpress SARS-CoV-2/FLU/RSV plus assay is intended as an aid in the diagnosis of influenza from Nasopharyngeal swab specimens and should not be used as a sole basis for treatment. Nasal washings and aspirates are unacceptable for Xpert Xpress SARS-CoV-2/FLU/RSV testing.  Fact Sheet for Patients: BloggerCourse.com  Fact Sheet for Healthcare Providers: SeriousBroker.it  This test is not yet approved or cleared by the [place] FDA and has been authorized for detection and/or diagnosis of SARS-CoV-2 by FDA under an Emergency Use Authorization (EUA). This EUA will remain in effect (meaning this test can  be used) for the duration of the COVID-19 declaration under Section 564(b)(1) of the Act, 21 U.S.C. section 360bbb-3(b)(1), unless the authorization is terminated or revoked.  Performed at [facility], [address] [place], [place] [address]   Blood culture (routine single)     Status: Abnormal   Collection Time: 10/31/20 11:00 AM   Specimen: BLOOD LEFT FOREARM  Result Value Ref Range Status   Specimen Description BLOOD LEFT FOREARM  Final   Special Requests   Final    BOTTLES DRAWN AEROBIC AND ANAEROBIC Blood Culture adequate volume   Culture  Setup Time   Final    GRAM POSITIVE COCCI IN CLUSTERS IN BOTH AEROBIC AND ANAEROBIC BOTTLES Organism ID to follow CRITICAL RESULT CALLED TO, READ BACK BY AND VERIFIED WITH: JCyndia Bent PHARMD, AT 0221 11/01/20 Renato Shin Performed at [facility], [address], [place], [place] [address]    Culture METHICILLIN RESISTANT STAPHYLOCOCCUS AUREUS (A)  Final   Report Status 11/03/2020 FINAL  Final   Organism ID, Bacteria METHICILLIN RESISTANT STAPHYLOCOCCUS AUREUS  Final      Susceptibility   Methicillin resistant staphylococcus aureus - MIC*  CIPROFLOXACIN >=8 RESISTANT Resistant     ERYTHROMYCIN >=8 RESISTANT Resistant     GENTAMICIN <=0.5 SENSITIVE Sensitive     OXACILLIN >=4 RESISTANT Resistant     TETRACYCLINE <=1 SENSITIVE Sensitive     VANCOMYCIN <=0.5 SENSITIVE Sensitive     TRIMETH/SULFA <=10 SENSITIVE Sensitive     CLINDAMYCIN <=0.25 SENSITIVE Sensitive     RIFAMPIN <=0.5 SENSITIVE Sensitive     Inducible Clindamycin NEGATIVE Sensitive     * METHICILLIN RESISTANT STAPHYLOCOCCUS AUREUS  Blood Culture ID Panel (Reflexed)     Status: Abnormal   Collection Time: 10/31/20 11:00 AM  Result Value Ref Range Status   Enterococcus faecalis NOT DETECTED NOT DETECTED Final   Enterococcus Faecium NOT DETECTED NOT DETECTED Final   Listeria monocytogenes NOT DETECTED NOT DETECTED Final   Staphylococcus species DETECTED (A) NOT  DETECTED Final    Comment: CRITICAL RESULT CALLED TO, READ BACK BY AND VERIFIED WITH: J. LEDFORD PHARMD, AT 0221 11/01/20 D. VANHOOK    Staphylococcus aureus (BCID) DETECTED (A) NOT DETECTED Final    Comment: Methicillin (oxacillin)-resistant Staphylococcus aureus (MRSA). MRSA is predictably resistant to beta-lactam antibiotics (except ceftaroline). Preferred therapy is vancomycin unless clinically contraindicated. Patient requires contact precautions if  hospitalized. CRITICAL RESULT CALLED TO, READ BACK BY AND VERIFIED WITH: J. LEDFORD PHARMD, AT 0221 11/01/20 D. VANHOOK    Staphylococcus epidermidis NOT DETECTED NOT DETECTED Final   Staphylococcus lugdunensis NOT DETECTED NOT DETECTED Final   Streptococcus species NOT DETECTED NOT DETECTED Final   Streptococcus agalactiae NOT DETECTED NOT DETECTED Final   Streptococcus pneumoniae NOT DETECTED NOT DETECTED Final   Streptococcus pyogenes NOT DETECTED NOT DETECTED Final   A.calcoaceticus-baumannii NOT DETECTED NOT DETECTED Final   Bacteroides fragilis NOT DETECTED NOT DETECTED Final   Enterobacterales NOT DETECTED NOT DETECTED Final   Enterobacter cloacae complex NOT DETECTED NOT DETECTED Final   Escherichia coli NOT DETECTED NOT DETECTED Final   Klebsiella aerogenes NOT DETECTED NOT DETECTED Final   Klebsiella oxytoca NOT DETECTED NOT DETECTED Final   Klebsiella pneumoniae NOT DETECTED NOT DETECTED Final   Proteus species NOT DETECTED NOT DETECTED Final   Salmonella species NOT DETECTED NOT DETECTED Final   Serratia marcescens NOT DETECTED NOT DETECTED Final   Haemophilus influenzae NOT DETECTED NOT DETECTED Final   Neisseria meningitidis NOT DETECTED NOT DETECTED Final   Pseudomonas aeruginosa NOT DETECTED NOT DETECTED Final   Stenotrophomonas maltophilia NOT DETECTED NOT DETECTED Final   Candida albicans NOT DETECTED NOT DETECTED Final   Candida auris NOT DETECTED NOT DETECTED Final   Candida glabrata NOT DETECTED NOT DETECTED Final    Candida krusei NOT DETECTED NOT DETECTED Final   Candida parapsilosis NOT DETECTED NOT DETECTED Final   Candida tropicalis NOT DETECTED NOT DETECTED Final   Cryptococcus neoformans/gattii NOT DETECTED NOT DETECTED Final   Meth resistant mecA/C and MREJ DETECTED (A) NOT DETECTED Final    Comment: CRITICAL RESULT CALLED TO, READ BACK BY AND VERIFIED WITH: J. LEDFORD PHARMD, AT 0221 11/01/20 Renato Shin Performed at [facility], [address], [place], [place] [address]   Urine Culture     Status: Abnormal   Collection Time: 10/31/20  1:29 PM   Specimen: In/Out Cath Urine  Result Value Ref Range Status   Specimen Description IN/OUT CATH URINE  Final   Special Requests   Final    NONE Performed at [facility], [address], [place], [place] [address]    Culture MULTIPLE SPECIES PRESENT,  SUGGEST RECOLLECTION (A)  Final   Report Status 11/01/2020 FINAL  Final  MRSA Next Gen by PCR, Nasal     Status: Abnormal   Collection Time: 10/31/20  7:50 PM   Specimen: Nasal Mucosa; Nasal Swab  Result Value Ref Range Status   MRSA by PCR Next Gen DETECTED (A) NOT DETECTED Final    Comment: RESULT CALLED TO, READ BACK BY AND VERIFIED WITH: WARNER,B RN AT 2144 10/31/2020 MITCHELL,L (NOTE) The GeneXpert MRSA Assay (FDA approved for NASAL specimens only), is one component of a comprehensive MRSA colonization surveillance program. It is not intended to diagnose MRSA infection nor to guide or monitor treatment for MRSA infections. Test performance is not FDA approved in patients less than 32 years old. Performed at [facility], [address] [place], [place] [address]   Culture, blood (routine x 2)     Status: None   Collection Time: 11/01/20 10:22 AM   Specimen: BLOOD RIGHT FOREARM  Result Value Ref Range Status   Specimen Description BLOOD RIGHT FOREARM  Final   Special Requests   Final    BOTTLES DRAWN AEROBIC AND ANAEROBIC Blood Culture adequate volume   Culture   Final     NO GROWTH 5 DAYS Performed at [facility], [address] [place], [place] [address]    Report Status 11/06/2020 FINAL  Final  Culture, blood (routine x 2)     Status: None   Collection Time: 11/01/20 10:29 AM   Specimen: BLOOD RIGHT HAND  Result Value Ref Range Status   Specimen Description BLOOD RIGHT HAND  Final   Special Requests   Final    BOTTLES DRAWN AEROBIC AND ANAEROBIC Blood Culture adequate volume   Culture   Final    NO GROWTH 5 DAYS Performed at [facility], [address] [place], [place] [address]    Report Status 11/06/2020 FINAL  Final  Culture, blood (routine x 2)     Status: Abnormal   Collection Time: 11/04/20  5:50 AM   Specimen: BLOOD RIGHT HAND  Result Value Ref Range Status   Specimen Description BLOOD RIGHT HAND  Final   Special Requests   Final    BOTTLES DRAWN AEROBIC ONLY Blood Culture results may not be optimal due to an inadequate volume of blood received in culture bottles   Culture  Setup Time   Final    GRAM POSITIVE COCCI IN CLUSTERS AEROBIC BOTTLE ONLY CRITICAL RESULT CALLED TO, READ BACK BY AND VERIFIED WITH: L. SEAY,PHARMD 0211 11/05/2020 T. TYSOR    Culture (A)  Final    STAPHYLOCOCCUS AUREUS SUSCEPTIBILITIES PERFORMED ON PREVIOUS CULTURE WITHIN THE LAST 5 DAYS. Performed at [facility], [address] [place], [place] [address]    Report Status 11/07/2020 FINAL  Final  Culture, blood (routine x 2)     Status: None (Preliminary result)   Collection Time: 11/04/20  6:00 AM   Specimen: BLOOD RIGHT HAND  Result Value Ref Range Status   Specimen Description BLOOD RIGHT HAND  Final   Special Requests   Final    BOTTLES DRAWN AEROBIC ONLY Blood Culture adequate volume   Culture   Final    NO GROWTH 2 DAYS Performed at [facility], [address] [place], [place] [address]    Report Status PENDING  Incomplete  Culture, blood (routine x 2)     Status: Abnormal (Preliminary result)   Collection Time:  11/05/20  8:17 AM   Specimen: BLOOD  Result Value Ref Range Status   Specimen Description BLOOD RIGHT ANTECUBITAL  Final   Special Requests   Final    BOTTLES DRAWN AEROBIC AND ANAEROBIC Blood Culture results may not be optimal due to an inadequate volume of blood received in culture bottles   Culture  Setup Time   Final    GRAM POSITIVE COCCI IN CLUSTERS ANAEROBIC BOTTLE ONLY CRITICAL VALUE NOTED.  VALUE IS CONSISTENT WITH PREVIOUSLY REPORTED AND CALLED VALUE. Performed at [facility], [address] [place], [place] [address]    Culture STAPHYLOCOCCUS AUREUS (A)  Final   Report Status PENDING  Incomplete  Culture, blood (routine x 2)     Status: None (Preliminary result)   Collection Time: 11/05/20 10:06 AM   Specimen: BLOOD RIGHT HAND  Result Value Ref Range Status   Specimen Description BLOOD RIGHT HAND  Final   Special Requests   Final    BOTTLES DRAWN AEROBIC AND ANAEROBIC Blood Culture adequate volume   Culture   Final    NO GROWTH < 24 HOURS Performed at [facility], [address] [place], [place] [address]    Report Status PENDING  Incomplete    Pertinent Lab. CBC Latest Ref Rng & Units 11/07/2020 11/06/2020 11/06/2020  WBC 4.0 - 10.5 K/uL 38.5(H) 40.2(H) 38.1(H)  Hemoglobin 12.0 - 15.0 g/dL 7.0(L) 7.4(L) 6.6(LL)  Hematocrit 36.0 - 46.0 % 21.1(L) 21.4(L) 19.4(L)  Platelets 150 - 400 K/uL 415(H) 406(H) 368   CMP Latest Ref Rng & Units 11/06/2020 11/05/2020 11/04/2020  Glucose 70 - 99 mg/dL 774(J) 287(O) 676(H)  BUN 6 - 20 mg/dL <2(C) 6 8  Creatinine 9.47 - 1.00 mg/dL 0.96(G) 8.36(O) 2.94  Sodium 135 - 145 mmol/L 130(L) 128(L) 131(L)  Potassium 3.5 - 5.1 mmol/L 4.2 3.3(L) 3.5  Chloride 98 - 111 mmol/L 94(L) 91(L) 92(L)  CO2 22 - 32 mmol/L 30 30 28   Calcium 8.9 - 10.3 mg/dL 7.3(L) 7.4(L) 7.6(L)  Total Protein 6.5 - 8.1 g/dL - - -  Total Bilirubin 0.3 - 1.2 mg/dL - - -  Alkaline Phos 38 - 126 U/L - - -  AST 15 - 41 U/L - - -  ALT 0 - 44 U/L - - -      Pertinent Imaging today Plain films and CT images have been personally visualized and interpreted; radiology reports have been reviewed. Decision making incorporated into the Impression / Recommendations.  TEE prelim 11/03/20 FINDINGS:   LEFT VENTRICLE: EF is hyperdynamic. No regional wall motion abnormalities.  RIGHT VENTRICLE: Normal size and function.   LEFT ATRIUM: No thrombus/mass.   LEFT ATRIAL APPENDAGE: No thrombus/mass.   RIGHT ATRIUM: No thrombus/mass.   AORTIC VALVE:  Trileaflet. No regurgitation. No vegetation.   MITRAL VALVE:    Normal structure. No significant regurgitation. No vegetation.   TRICUSPID VALVE: Abnormal valve suggestive of endocarditis. There is no discrete mass seen attached to the valve, but the valve is overall thickened, and there are thickened/echogenic areas of the subvalvular apparatus. Trivial TR.   PULMONIC VALVE: Grossly normal structure. Trivial regurgitation. No apparent vegetation.   INTERATRIAL SEPTUM: No PFO or ASD seen by color Doppler.   PERICARDIUM: Trivialeffusion noted.   DESCENDING AORTA: No plaque seen     CONCLUSION: Suggestive of tricuspid valve endocarditis. While no discrete echodensity is seen on the atrial surface of the valve, the overall thickening and appearance of the valve and subvalvular apparatus supports infection.  Cannot exclude that tricuspid valve had vegetation at one time resulting in septic emboli.   I spent more than 35 minutes for this patient encounter including review of prior medical records, coordination of care  with greater than 50% of time being face to face/counseling and discussing diagnostics/treatment plan with the patient/family.  Electronically signed by:   Odette Fraction, MD Infectious Disease Physician [facility] for Infectious Disease Pager: [phone]

## 2020-11-07 NOTE — Progress Notes (Signed)
Physical Therapy Treatment Patient Details Name: Chelsea Mathis MRN: [id number] DOB: Aug 02, 1990 Today's Date: 11/07/2020    History of Present Illness Pt is 30 y.o. female admitted with DKA, multiple electrolyte disturbances and severe sepsis secondary to bacteremia on 10/31/20. CTA (+) cavitary lung lesions. 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 8/6 acute PE likely related to infective source rather than blood clot in setting of endocarditis started on IV heparin. CT femur suggestive of myositis on R thigh. PMHx significant for poorly controlled DMII and recent UTI.    PT Comments    Pt making progress but not as quickly as expected for recommendation of HH.  Updated recommendation to CIR.  She is motivated and participating but requiring min-mod A of 2 for transfers and to ambulate 2'x2.  Requiring O2 and is limited by pain in bil groins and chest from pulmonary emboli.  Pt has family support and was completely independent prior to admission.  Continue to progress as able.     Follow Up Recommendations  CIR     Equipment Recommendations  Rolling walker with 5" wheels;Wheelchair cushion (measurements PT);Wheelchair (measurements PT);3in1 (PT)    Recommendations for Other Services       Precautions / Restrictions Precautions Precautions: Fall    Mobility  Bed Mobility Overal bed mobility: Needs Assistance Bed Mobility: Supine to Sit;Sit to Supine     Supine to sit: Mod assist;+2 for safety/equipment Sit to supine: Mod assist;+2 for safety/equipment   General bed mobility comments: Increased time due to pain with assist for legs and trunks    Transfers Overall transfer level: Needs assistance Equipment used: Rolling walker (2 wheeled) Transfers: Sit to/from [employer] Sit to Stand: Mod assist;+2 physical assistance;+2 safety/equipment Stand pivot transfers: Min assist;+2 physical assistance;+2 safety/equipment       General transfer comment:  Sit to stand from elevated bed and from recliner.  Required assist of 2 using gait belt and bed pad to facilitate and cues for hand placement.  Increased effort to stand from chair. Stand pivot to chair and back to bed  Ambulation/Gait Ambulation/Gait assistance: Min assist;+2 physical assistance;+2 safety/equipment Gait Distance (Feet): 2 Feet (2'x2) Assistive device: Rolling walker (2 wheeled) Gait Pattern/deviations: Decreased stride length;Shuffle Gait velocity: decreased   General Gait Details: 2'x2 to chair and back to bed; assist with RW and to maintain balance; cues for full turn before sitting   Stairs             Wheelchair Mobility    Modified Rankin (Stroke Patients Only)       Balance Overall balance assessment: Needs assistance Sitting-balance support: Feet supported Sitting balance-Leahy Scale: Good     Standing balance support: Bilateral upper extremity supported;During functional activity Standing balance-Leahy Scale: Poor Standing balance comment: Reliant on BUE and external assist.                            Cognition Arousal/Alertness: Awake/alert Behavior During Therapy: WFL for tasks assessed/performed Overall Cognitive Status: Within Functional Limits for tasks assessed                                        Exercises General Exercises - Lower Extremity Ankle Circles/Pumps: AROM;Both;5 reps;Supine Quad Sets: AROM;Both;5 reps;Supine Long Arc Quad: AROM;Both;5 reps;Seated Heel Slides: AAROM;Both;5 reps;Supine (limited range due to groin pain)    General  Comments General comments (skin integrity, edema, etc.): VSS.  All transfers with pursed lip breathing, increased time/effort, limited by pain      Pertinent Vitals/Pain Pain Assessment: Faces Faces Pain Scale: Hurts whole lot Pain Location: Bil Groin (R worse than L) and L chest (reports has been hurting from PEs) Pain Descriptors / Indicators:  Aching;Sore;Sharp;Shooting Pain Intervention(s): Limited activity within patient's tolerance;Monitored during session;Patient requesting pain meds-RN notified;Premedicated before session    Home Living                      Prior Function            PT Goals (current goals can now be found in the care plan section) Acute Rehab PT Goals Patient Stated Goal: To return home. PT Goal Formulation: With patient Time For Goal Achievement: 11/18/20 Potential to Achieve Goals: Good Progress towards PT goals: Progressing toward goals    Frequency    Min 3X/week      PT Plan Discharge plan needs to be updated    Co-evaluation              AM-PAC PT "6 Clicks" Mobility   Outcome Measure  Help needed turning from your back to your side while in a flat bed without using bedrails?: A Lot Help needed moving from lying on your back to sitting on the side of a flat bed without using bedrails?: A Lot Help needed moving to and from a bed to a chair (including a wheelchair)?: Total Help needed standing up from a chair using your arms (e.g., wheelchair or bedside chair)?: Total Help needed to walk in hospital room?: Total Help needed climbing 3-5 steps with a railing? : Total 6 Click Score: 8    End of Session Equipment Utilized During Treatment: Gait belt;Oxygen Activity Tolerance: Patient limited by pain Patient left: in bed;with call bell/phone within reach;with family/visitor present Nurse Communication: Mobility status;Patient requests pain meds PT Visit Diagnosis: Unsteadiness on feet (R26.81);Muscle weakness (generalized) (M62.81)     Time: 1638-4665 PT Time Calculation (min) (ACUTE ONLY): 35 min  Charges:  $Therapeutic Activity: 23-37 mins                     Anise Salvo, PT Acute Rehab Services Pager [phone] [facility] [phone]    Rayetta Humphrey 11/07/2020, 5:06 PM

## 2020-11-07 NOTE — Progress Notes (Signed)
  Echocardiogram 2D Echocardiogram has been performed.  Chelsea Mathis F 11/07/2020, 1:27 PM

## 2020-11-07 NOTE — TOC Initial Note (Signed)
Transition of Care [facility]) - Initial/Assessment Note    Patient Details  Name: Chelsea Mathis MRN: [id number] Date of Birth: 1990/09/29  Transition of Care [facility]) CM/SW Contact:    Carley Hammed, LCSWA Phone Number: 11/07/2020, 4:47 PM  Clinical Narrative:                 CSW spoke with pt to discuss PT recommendation of HH. CSW explained the barriers with her insurance. CSW spoke with [facility] who noted they do accept her insurance, but have no availability at this time. They requested TOC follow up when closer to DC. Pt will need a rolling walker as well when closer to DC. TOC will continue to follow for DC needs.  Expected Discharge Plan: Home w Home Health Services Barriers to Discharge: Continued Medical Work up, No Home Care Agency will accept this patient   Patient Goals and CMS Choice Patient states their goals for this hospitalization and ongoing recovery are:: Pt states her goal is to get home. CMS Medicare.gov Compare Post Acute Care list provided to:: Patient Choice offered to / list presented to : Patient  Expected Discharge Plan and Services Expected Discharge Plan: Home w Home Health Services     Post Acute Care Choice: Home Health Living arrangements for the past 2 months: Single Family Home                                      Prior Living Arrangements/Services Living arrangements for the past 2 months: Single Family Home Lives with:: Significant Other Patient language and need for interpreter reviewed:: Yes Do you feel safe going back to the place where you live?: Yes      Need for Family Participation in Patient Care: No (Comment) Care giver support system in place?: Yes (comment)   Criminal Activity/Legal Involvement Pertinent to Current Situation/Hospitalization: No - Comment as needed  Activities of Daily Living      Permission Sought/Granted                  Emotional Assessment Appearance:: Appears stated  age Attitude/Demeanor/Rapport: Engaged Affect (typically observed): Appropriate Orientation: : Oriented to Self, Oriented to Place, Oriented to  Time, Oriented to Situation Alcohol / Substance Use: Not Applicable Psych Involvement: No (comment)  Admission diagnosis:  Hyponatremia [E87.1] DKA (diabetic ketoacidosis) (HCC) [E11.10] Diabetic ketoacidosis without coma associated with type 2 diabetes mellitus (HCC) [E11.10] Sepsis with acute renal failure without septic shock, due to unspecified organism, unspecified acute renal failure type (HCC) [A41.9, R65.20, N17.9] Patient Active Problem List   Diagnosis Date Noted   Myositis 11/04/2020   Myofasciitis 11/04/2020   Bacteremia due to Staphylococcus aureus 11/02/2020   Septic pulmonary embolism (HCC) 11/02/2020   Type 2 diabetes mellitus with hypoglycemia without coma (HCC) 11/02/2020   Severe sepsis (HCC) 11/02/2020   DKA (diabetic ketoacidosis) (HCC) 10/31/2020   Acute appendicitis 11/10/2016   Postpartum care following vaginal delivery 05/13/2015   Gestational diabetes mellitus (GDM) affecting pregnancy 05/13/2015   PCP:  Oneita Hurt, No Pharmacy:   [id number] [place], [place] - [address] AT [facility] OF [address] [place] [address] Phone: [phone] Fax: [phone]     Social Determinants of Health (SDOH) Interventions    Readmission Risk Interventions No flowsheet data found.

## 2020-11-07 NOTE — Plan of Care (Signed)
  Problem: Education: Goal: Ability to describe self-care measures that may prevent or decrease complications (Diabetes Survival Skills Education) will improve Outcome: Progressing Goal: Individualized Educational Video(s) Outcome: Progressing   Problem: Metabolic: Goal: Ability to maintain appropriate glucose levels will improve Outcome: Progressing   Problem: Education: Goal: Knowledge of General Education information will improve Description: Including pain rating scale, medication(s)/side effects and non-pharmacologic comfort measures Outcome: Progressing   Problem: Health Behavior/Discharge Planning: Goal: Ability to manage health-related needs will improve Outcome: Progressing   Problem: Clinical Measurements: Goal: Ability to maintain clinical measurements within normal limits will improve Outcome: Progressing Goal: Will remain free from infection Outcome: Progressing Goal: Diagnostic test results will improve Outcome: Progressing Goal: Respiratory complications will improve Outcome: Progressing Goal: Cardiovascular complication will be avoided Outcome: Progressing   Problem: Activity: Goal: Risk for activity intolerance will decrease Outcome: Progressing   Problem: Nutrition: Goal: Adequate nutrition will be maintained Outcome: Progressing   Problem: Coping: Goal: Level of anxiety will decrease Outcome: Progressing   Problem: Elimination: Goal: Will not experience complications related to bowel motility Outcome: Progressing Goal: Will not experience complications related to urinary retention Outcome: Progressing   Problem: Pain Managment: Goal: General experience of comfort will improve Outcome: Progressing   Problem: Safety: Goal: Ability to remain free from injury will improve Outcome: Progressing   Problem: Skin Integrity: Goal: Risk for impaired skin integrity will decrease Outcome: Progressing   

## 2020-11-07 NOTE — Progress Notes (Signed)
PROGRESS NOTE    Chelsea Mathis  MVE:[id number] DOB: 11-30-1990 DOA: 10/31/2020 PCP: Pcp, No   Brief Narrative: Chelsea Mathis is a 30 y.o. female with a history of diabetes mellitus type 2.  Patient presented secondary to elevated blood sugar, weakness, fatigue with evidence of sepsis.  She started on empiric biotics with blood cultures significant for Staph aureus.  She is also found to have a septic pulmonary emboli on CT imaging concerning for possible endocarditis.   Assessment & Plan:   Principal Problem:   Severe sepsis ([place]) Active Problems:   DKA (diabetic ketoacidosis) ([place])   Bacteremia due to Staphylococcus aureus   Septic pulmonary embolism (H[facility])   Type 2 diabetes mellitus with hypoglycemia without coma ([place])   Myositis   Myofasciitis   Severe sepsis Present on admission. Patient admitted to ICU, but no need for vasopressor support. Cavitary lung lesions noted on CTA chest. Empirically started on Vancomycin/Clindamycin/Zosyn. Blood culture significant for staphylococcus aureus infection and transitioned to Vancomycin IV monotherapy. Leukocytosis seems to have reached a peak.  Persistent staphylococcus bacteremia Cavitary lung lesions Infectious disease consulted. Empiric antibiotics as above and now transitioned to Vancomycin IV monotherapy. Repeat chest x-ray slightly worsened, however, symptoms stable. 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 Repeat BCx (8/2) no growth however BCx (8/5 AND 8/6) positive for staphylococcus aureus.  -ID recommendations: Vancomycin IV, overall 6 week plan for antibiotics, repeat blood cultures today -Follow-up Blood cultures (8/8)  Acute pulmonary embolism Diagnosed 8/6. Likely related to infective source rather than blood clot in setting of endocarditis. Started on heparin IV with bolus overnight on 8/7 with resultant acute on chronic anemia. Heparin discontinued. Venous duplexes of all 4  extremities was negative for DVT.   DKA In setting of infection and recent prednisone use. Patient presented with elevated glucose, anion gap beta-hydroxybutyric acid and low bicarbonate level. Patient started on insulin drip with rapid improvement in anion gap, CO2 and blood sugars. Transitioned off of insulin drip and to Lantus. Resolved.  Tachycardia In setting of acute illness. Fever last night. Sinus rhythm. Given IV fluids. Continues to be slightly tachycardic but stable. In light of worsened right sided chest pain and continued hypoxia (in setting of septic emboli), may need to consider PE. CTA chest significant for filling defect in lobar/segmental aspects of right lower lobe suggesting PE.  Acute on chronic anemia In setting of thrombocytopenia while on her menstrual cycle. Had been stable around 7.3-7.4 but now acutely dropped after heparin. No source of bleeding identified at this time. Patient's menstrual cycle has ceased. 1 unit of PRBC ordered morning of 8/7. Hemoglobin down to 7 this morning. No evidence of bleeding -H&H this evening and CBC in AM  Thrombocytopenia Likely secondary to acute infection. Resolved.  Thrombocytosis Mild. Likely reactive in setting of infection.  Diabetes mellitus, type 2 History of diabetes; recently started on steroids. Patient's hemoglobin A1C is 12.3%. -Continue Semglee 35 units daily and Novolog 10 units TID with meals. Continue SSI with meals -Continue carb modified diet  Hyponatremia Severe. Sodium of 103 on admission, although repeat sodium 20 min later of 120. Rapid increase with control of blood sugar. Most likely mainly secondary to pseudohyponatremia from hyperglycemia. Improved slightly and seems stable  AKI In setting of DKA and likely accompanying dehydration. Resolved with IV fluids.  Atrial fibrillation Appears to be transient. Started on Amiodarone with bolus and drip. Currently in sinus  rhythm.  Myositis/myofasciitis Secondary to infection. CK elevated on admission but  repeat is normal. Aldolase is elevated. RF is mildly elevated and ANA is negative. Initial imaging without any specific findings. MRI of bilateral thighs significant for evidence of myositis/myofasciitis. Orthopedic surgery consulted and recommend no surgical intervention. Overall, symptoms are improving -Percocet prn  Abdominal pain Patient states pain has always been there but has worsened. Seems out of proportion. No associated bowel movements/hematochezia/melena. In setting of known septic emboli, however. No bowel movement since admission. No obstruction or other abnormalities seen on abdominal x-ray. Improved today. Patient is trying to have a bowel movement.   DVT prophylaxis: SCDs Code Status:   Code Status: Full Code Family Communication: None at bedside. Called fiance, no response Disposition Plan: Discharge likely home in 3-5 days pending continued workup/management of bacteremia, improvement of sepsis, myalgias   Consultants:  PCCM Infectious disease Orthopedic surgery  Procedures:  TRANSTHORACIC ECHOCARDIOGRAM (11/01/2020) IMPRESSIONS     1. Left ventricular ejection fraction, by estimation, is 70 to 75%. The  left ventricle has hyperdynamic function. The left ventricle has no  regional wall motion abnormalities. Indeterminate diastolic filling due to  E-A fusion.   2. Right ventricular systolic function is normal. The right ventricular  size is normal.   3. The mitral valve is normal in structure. No evidence of mitral valve  regurgitation. No evidence of mitral stenosis.   4. The aortic valve is normal in structure. Aortic valve regurgitation is  not visualized. No aortic stenosis is present.   5. The inferior vena cava is normal in size with greater than 50%  respiratory variability, suggesting right atrial pressure of 3 mmHg.   Conclusion(s)/Recommendation(s): No evidence of  valvular vegetations on  this transthoracic echocardiogram. Would recommend a transesophageal  echocardiogram to exclude infective endocarditis if clinically indicated  Antimicrobials: Vancomycin IV Zosyn IV Clindamycin IV    Subjective: Significant right leg pain, although patient states this is secondary to being on the bed pan. Prior, she was not having such significant pain.  Objective: Vitals:   11/06/20 2325 11/07/20 0311 11/07/20 0600 11/07/20 0757  BP: 113/65 (!) 110/59  128/82  Pulse:  (!) 116  (!) 125  Resp: (!) 27 (!) 23  15  Temp: 99.3 F (37.4 C) 99.3 F (37.4 C)  98.9 F (37.2 C)  TempSrc: Oral Oral  Oral  SpO2:  97%  96%  Weight:   101.7 kg   Height:        Intake/Output Summary (Last 24 hours) at 11/07/2020 0957 Last data filed at 11/07/2020 8676 Gross per 24 hour  Intake 327 ml  Output 2950 ml  Net -2623 ml    Filed Weights   11/03/20 1336 11/04/20 0410 11/07/20 0600  Weight: 98 kg 101.5 kg 101.7 kg    Examination:  General exam: Appears anxious and agitated. Seems distressed. Conversant Respiratory: Rales. Tachypnea in setting of high degree of anxiety. Cardiovascular: S1 & S2 heard, tachycardia, normal rhythm. No murmurs, rubs, gallops or clicks.  Gastrointestinal: Abdomen is nondistended, soft and nontender. No masses felt. Bowel sounds heard Neurologic: No focal neurological deficits Musculoskeletal: bilateral arms are swollen but soft and warm Skin: No cyanosis. No new rashes Psychiatry: Alert and oriented. Memory. Anxious   Data Reviewed: I have personally reviewed following labs and imaging studies  CBC Lab Results  Component Value Date   WBC 38.5 (H) 11/07/2020   RBC 2.62 (L) 11/07/2020   HGB 7.0 (L) 11/07/2020   HCT 21.1 (L) 11/07/2020   MCV 80.5 11/07/2020   MCH 26.7  11/07/2020   PLT 415 (H) 11/07/2020   MCHC 33.2 11/07/2020   RDW 15.2 11/07/2020   LYMPHSABS 3.0 11/06/2020   MONOABS 1.5 (H) 11/06/2020   EOSABS 0.4 11/06/2020    BASOSABS 0.0 16/01/9603     Last metabolic panel Lab Results  Component Value Date   NA 130 (L) 11/06/2020   K 4.2 11/06/2020   CL 94 (L) 11/06/2020   CO2 30 11/06/2020   BUN <5 (L) 11/06/2020   CREATININE 0.41 (L) 11/06/2020   GLUCOSE 139 (H) 11/06/2020   GFRNONAA >60 11/06/2020   GFRAA >60 11/10/2016   CALCIUM 7.3 (L) 11/06/2020   PHOS 3.5 11/03/2020   PROT 5.4 (L) 11/01/2020   ALBUMIN 1.5 (L) 11/01/2020   BILITOT 0.9 11/01/2020   ALKPHOS 106 11/01/2020   AST 55 (H) 11/01/2020   ALT 21 11/01/2020   ANIONGAP 6 11/06/2020    CBG (last 3)  Recent Labs    11/06/20 1531 11/06/20 1956 11/07/20 0754  GLUCAP 139* 95 133*      GFR: Estimated Creatinine Clearance: 117.1 mL/min (A) (by C-G formula based on SCr of 0.41 mg/dL (L)).  Coagulation Profile: Recent Labs  Lab 10/31/20 1109 10/31/20 2056  INR 1.4* 1.3*     Recent Results (from the past 240 hour(s))  Resp Panel by RT-PCR (Flu A&B, Covid) Nasopharyngeal Swab     Status: None   Collection Time: 10/31/20 10:53 AM   Specimen: Nasopharyngeal Swab; Nasopharyngeal(NP) swabs in vial transport medium  Result Value Ref Range Status   SARS Coronavirus 2 by RT PCR NEGATIVE NEGATIVE Final    Comment: (NOTE) SARS-CoV-2 target nucleic acids are NOT DETECTED.  The SARS-CoV-2 RNA is generally detectable in upper respiratory specimens during the acute phase of infection. The lowest concentration of SARS-CoV-2 viral copies this assay can detect is 138 copies/mL. A negative result does not preclude SARS-Cov-2 infection and should not be used as the sole basis for treatment or other patient management decisions. A negative result may occur with  improper specimen collection/handling, submission of specimen other than nasopharyngeal swab, presence of viral mutation(s) within the areas targeted by this assay, and inadequate number of viral copies(<138 copies/mL). A negative result must be combined with clinical  observations, patient history, and epidemiological information. The expected result is Negative.  Fact Sheet for Patients:  EntrepreneurPulse.com.au  Fact Sheet for Healthcare Providers:  IncredibleEmployment.be  This test is no t yet approved or cleared by the [place] FDA and  has been authorized for detection and/or diagnosis of SARS-CoV-2 by FDA under an Emergency Use Authorization (EUA). This EUA will remain  in effect (meaning this test can be used) for the duration of the COVID-19 declaration under Section 564(b)(1) of the Act, 21 U.S.C.section 360bbb-3(b)(1), unless the authorization is terminated  or revoked sooner.       Influenza A by PCR NEGATIVE NEGATIVE Final   Influenza B by PCR NEGATIVE NEGATIVE Final    Comment: (NOTE) The Xpert Xpress SARS-CoV-2/FLU/RSV plus assay is intended as an aid in the diagnosis of influenza from Nasopharyngeal swab specimens and should not be used as a sole basis for treatment. Nasal washings and aspirates are unacceptable for Xpert Xpress SARS-CoV-2/FLU/RSV testing.  Fact Sheet for Patients: EntrepreneurPulse.com.au  Fact Sheet for Healthcare Providers: IncredibleEmployment.be  This test is not yet approved or cleared by the [place] FDA and has been authorized for detection and/or diagnosis of SARS-CoV-2 by FDA under an Emergency Use Authorization (EUA). This EUA will remain  in effect (meaning this test can be used) for the duration of the COVID-19 declaration under Section 564(b)(1) of the Act, 21 U.S.C. section 360bbb-3(b)(1), unless the authorization is terminated or revoked.  Performed at [facility], [place] [address], [place], [place] [address]   Blood culture (routine single)     Status: Abnormal   Collection Time: 10/31/20 11:00 AM   Specimen: BLOOD LEFT FOREARM  Result Value Ref Range Status   Specimen Description BLOOD LEFT FOREARM   Final   Special Requests   Final    BOTTLES DRAWN AEROBIC AND ANAEROBIC Blood Culture adequate volume   Culture  Setup Time   Final    GRAM POSITIVE COCCI IN CLUSTERS IN BOTH AEROBIC AND ANAEROBIC BOTTLES Organism ID to follow CRITICAL RESULT CALLED TO, READ BACK BY AND VERIFIED WITH: JJerrilyn Cairo PHARMD, AT 0221 11/01/20 Rush Landmark Performed at [facility], [place] [address] [place], [place] [address]    Culture METHICILLIN RESISTANT STAPHYLOCOCCUS AUREUS (A)  Final   Report Status 11/03/2020 FINAL  Final   Organism ID, Bacteria METHICILLIN RESISTANT STAPHYLOCOCCUS AUREUS  Final      Susceptibility   Methicillin resistant staphylococcus aureus - MIC*    CIPROFLOXACIN >=8 RESISTANT Resistant     ERYTHROMYCIN >=8 RESISTANT Resistant     GENTAMICIN <=0.5 SENSITIVE Sensitive     OXACILLIN >=4 RESISTANT Resistant     TETRACYCLINE <=1 SENSITIVE Sensitive     VANCOMYCIN <=0.5 SENSITIVE Sensitive     TRIMETH/SULFA <=10 SENSITIVE Sensitive     CLINDAMYCIN <=0.25 SENSITIVE Sensitive     RIFAMPIN <=0.5 SENSITIVE Sensitive     Inducible Clindamycin NEGATIVE Sensitive     * METHICILLIN RESISTANT STAPHYLOCOCCUS AUREUS  Blood Culture ID Panel (Reflexed)     Status: Abnormal   Collection Time: 10/31/20 11:00 AM  Result Value Ref Range Status   Enterococcus faecalis NOT DETECTED NOT DETECTED Final   Enterococcus Faecium NOT DETECTED NOT DETECTED Final   Listeria monocytogenes NOT DETECTED NOT DETECTED Final   Staphylococcus species DETECTED (A) NOT DETECTED Final    Comment: CRITICAL RESULT CALLED TO, READ BACK BY AND VERIFIED WITH: J. LEDFORD PHARMD, AT 0221 11/01/20 D. VANHOOK    Staphylococcus aureus (BCID) DETECTED (A) NOT DETECTED Final    Comment: Methicillin (oxacillin)-resistant Staphylococcus aureus (MRSA). MRSA is predictably resistant to beta-lactam antibiotics (except ceftaroline). Preferred therapy is vancomycin unless clinically contraindicated. Patient requires contact precautions  if  hospitalized. CRITICAL RESULT CALLED TO, READ BACK BY AND VERIFIED WITH: J. LEDFORD PHARMD, AT 0221 11/01/20 D. VANHOOK    Staphylococcus epidermidis NOT DETECTED NOT DETECTED Final   Staphylococcus lugdunensis NOT DETECTED NOT DETECTED Final   Streptococcus species NOT DETECTED NOT DETECTED Final   Streptococcus agalactiae NOT DETECTED NOT DETECTED Final   Streptococcus pneumoniae NOT DETECTED NOT DETECTED Final   Streptococcus pyogenes NOT DETECTED NOT DETECTED Final   A.calcoaceticus-baumannii NOT DETECTED NOT DETECTED Final   Bacteroides fragilis NOT DETECTED NOT DETECTED Final   Enterobacterales NOT DETECTED NOT DETECTED Final   Enterobacter cloacae complex NOT DETECTED NOT DETECTED Final   Escherichia coli NOT DETECTED NOT DETECTED Final   Klebsiella aerogenes NOT DETECTED NOT DETECTED Final   Klebsiella oxytoca NOT DETECTED NOT DETECTED Final   Klebsiella pneumoniae NOT DETECTED NOT DETECTED Final   Proteus species NOT DETECTED NOT DETECTED Final   Salmonella species NOT DETECTED NOT DETECTED Final   Serratia marcescens NOT DETECTED NOT DETECTED Final   Haemophilus influenzae NOT DETECTED  NOT DETECTED Final   Neisseria meningitidis NOT DETECTED NOT DETECTED Final   Pseudomonas aeruginosa NOT DETECTED NOT DETECTED Final   Stenotrophomonas maltophilia NOT DETECTED NOT DETECTED Final   Candida albicans NOT DETECTED NOT DETECTED Final   Candida auris NOT DETECTED NOT DETECTED Final   Candida glabrata NOT DETECTED NOT DETECTED Final   Candida krusei NOT DETECTED NOT DETECTED Final   Candida parapsilosis NOT DETECTED NOT DETECTED Final   Candida tropicalis NOT DETECTED NOT DETECTED Final   Cryptococcus neoformans/gattii NOT DETECTED NOT DETECTED Final   Meth resistant mecA/C and MREJ DETECTED (A) NOT DETECTED Final    Comment: CRITICAL RESULT CALLED TO, READ BACK BY AND VERIFIED WITH: J. LEDFORD PHARMD, AT 0221 11/01/20 Rush Landmark Performed at [facility], [address] [place] [address] [place], [place] [address]   Urine Culture     Status: Abnormal   Collection Time: 10/31/20  1:29 PM   Specimen: In/Out Cath Urine  Result Value Ref Range Status   Specimen Description IN/OUT CATH URINE  Final   Special Requests   Final    NONE Performed at [facility], [place] [address], [place], [place] [address]    Culture MULTIPLE SPECIES PRESENT, SUGGEST RECOLLECTION (A)  Final   Report Status 11/01/2020 FINAL  Final  MRSA Next Gen by PCR, Nasal     Status: Abnormal   Collection Time: 10/31/20  7:50 PM   Specimen: Nasal Mucosa; Nasal Swab  Result Value Ref Range Status   MRSA by PCR Next Gen DETECTED (A) NOT DETECTED Final    Comment: RESULT CALLED TO, READ BACK BY AND VERIFIED WITH: WARNER,B RN AT 2144 10/31/2020 MITCHELL,L (NOTE) The GeneXpert MRSA Assay (FDA approved for NASAL specimens only), is one component of a comprehensive MRSA colonization surveillance program. It is not intended to diagnose MRSA infection nor to guide or monitor treatment for MRSA infections. Test performance is not FDA approved in patients less than 62 years old. Performed at [facility], [place] [address] [place], [place] [address]   Culture, blood (routine x 2)     Status: None   Collection Time: 11/01/20 10:22 AM   Specimen: BLOOD RIGHT FOREARM  Result Value Ref Range Status   Specimen Description BLOOD RIGHT FOREARM  Final   Special Requests   Final    BOTTLES DRAWN AEROBIC AND ANAEROBIC Blood Culture adequate volume   Culture   Final    NO GROWTH 5 DAYS Performed at [facility], [place] [address] [place], [place] [address]    Report Status 11/06/2020 FINAL  Final  Culture, blood (routine x 2)     Status: None   Collection Time: 11/01/20 10:29 AM   Specimen: BLOOD RIGHT HAND  Result Value Ref Range Status   Specimen Description BLOOD RIGHT HAND  Final   Special Requests   Final    BOTTLES DRAWN AEROBIC AND ANAEROBIC Blood Culture adequate volume   Culture   Final    NO  GROWTH 5 DAYS Performed at [facility], [place] [address], [place], [place] [address]    Report Status 11/06/2020 FINAL  Final  Culture, blood (routine x 2)     Status: Abnormal   Collection Time: 11/04/20  5:50 AM   Specimen: BLOOD RIGHT HAND  Result Value Ref Range Status   Specimen Description BLOOD RIGHT HAND  Final   Special Requests   Final    BOTTLES DRAWN AEROBIC ONLY Blood Culture  results may not be optimal due to an inadequate volume of blood received in culture bottles   Culture  Setup Time   Final    GRAM POSITIVE COCCI IN CLUSTERS AEROBIC BOTTLE ONLY CRITICAL RESULT CALLED TO, READ BACK BY AND VERIFIED WITH: L. SEAY,PHARMD 0211 11/05/2020 T. TYSOR    Culture (A)  Final    STAPHYLOCOCCUS AUREUS SUSCEPTIBILITIES PERFORMED ON PREVIOUS CULTURE WITHIN THE LAST 5 DAYS. Performed at [facility], [place] [address], [place], [place] [address]    Report Status 11/07/2020 FINAL  Final  Culture, blood (routine x 2)     Status: None (Preliminary result)   Collection Time: 11/04/20  6:00 AM   Specimen: BLOOD RIGHT HAND  Result Value Ref Range Status   Specimen Description BLOOD RIGHT HAND  Final   Special Requests   Final    BOTTLES DRAWN AEROBIC ONLY Blood Culture adequate volume   Culture   Final    NO GROWTH 2 DAYS Performed at [facility], [place] [address], [place], [place] [address]    Report Status PENDING  Incomplete  Culture, blood (routine x 2)     Status: Abnormal (Preliminary result)   Collection Time: 11/05/20  8:17 AM   Specimen: BLOOD  Result Value Ref Range Status   Specimen Description BLOOD RIGHT ANTECUBITAL  Final   Special Requests   Final    BOTTLES DRAWN AEROBIC AND ANAEROBIC Blood Culture results may not be optimal due to an inadequate volume of blood received in culture bottles   Culture  Setup Time   Final    GRAM POSITIVE COCCI IN CLUSTERS ANAEROBIC BOTTLE ONLY CRITICAL VALUE NOTED.  VALUE IS CONSISTENT WITH PREVIOUSLY REPORTED AND  CALLED VALUE. Performed at [facility], [place] [address], [place], [place] [address]    Culture STAPHYLOCOCCUS AUREUS (A)  Final   Report Status PENDING  Incomplete  Culture, blood (routine x 2)     Status: None (Preliminary result)   Collection Time: 11/05/20 10:06 AM   Specimen: BLOOD RIGHT HAND  Result Value Ref Range Status   Specimen Description BLOOD RIGHT HAND  Final   Special Requests   Final    BOTTLES DRAWN AEROBIC AND ANAEROBIC Blood Culture adequate volume   Culture   Final    NO GROWTH < 24 HOURS Performed at [facility], [place] [address] [place], [place] [address]    Report Status PENDING  Incomplete         Radiology Studies: CT Angio Chest Pulmonary Embolism (PE) W or WO Contrast  Addendum Date: 11/05/2020   ADDENDUM REPORT: 11/05/2020 23:03 ADDENDUM: Critical Value/emergent results were called by telephone at the time of physician contact on 11/05/2020 at 11:03 pm to provider Dr Orlin Hilding, who verbally acknowledged these results. Electronically Signed   By: Lovena Le M.D.   On: 11/05/2020 23:03   Result Date: 11/05/2020 CLINICAL DATA:  Chest pain, shortness of breath EXAM: CT ANGIOGRAPHY CHEST WITH CONTRAST TECHNIQUE: Multidetector CT imaging of the chest was performed using the standard protocol during bolus administration of intravenous contrast. Multiplanar CT image reconstructions and MIPs were obtained to evaluate the vascular anatomy. CONTRAST:  24m OMNIPAQUE IOHEXOL 350 MG/ML SOLN COMPARISON:  CT 10/31/2020 FINDINGS: Cardiovascular: Suboptimal contrast opacification of the pulmonary arteries with a central pulmonary artery contrast bolus measuring 186 mean Hounsfield units. Despite the suboptimal opacification, there is demonstrable pulmonary artery embolism seen in the right lower lobar and segmental branches (5/74).  No other large central or lobar filling defects are identified within the limitations of this exam. Central pulmonary arteries are top-normal  caliber. Elevation of the RV/LV ratio to approximately 1.5. No pericardial effusion. Cardiac size top normal. The aortic root is suboptimally assessed given cardiac pulsation artifact. The aorta is normal caliber. No acute luminal abnormality of the imaged aorta. No periaortic stranding or hemorrhage. Shared origin of the brachiocephalic and left common carotid arteries. Proximal great vessels are otherwise unremarkable. Mediastinum/Nodes: No mediastinal fluid or gas. Normal thyroid gland and thoracic inlet. No acute abnormality of the trachea or esophagus. No worrisome mediastinal, hilar or axillary adenopathy. Lungs/Pleura: Redemonstration and increasing number of numerous regions of masslike consolidation with some increasing central cavitation throughout both lungs as well as more confluent coalescent consolidation and interspersed regions of ground-glass opacity in the right base. Focal volume loss with heterogeneous enhancement of the underlying parenchyma in the right lower lobe as well. Small right pleural effusion. Trace left pleural fluid. No pneumothorax. Upper Abdomen: No acute abnormalities present in the visualized portions of the upper abdomen. Musculoskeletal: No acute osseous abnormality or suspicious osseous lesion. Review of the MIP images confirms the above findings. IMPRESSION: 1. Technically limited exam due to respiratory motion artifact and suboptimal contrast opacification. 2. Demonstrable lobar and segmental filling defects seen in the right lower lobe. Elevation of the RV/LV ratio and central pulmonary artery enlargement compatible with some degree of right heart strain. 3. Additional multifocal solid and cavitary nodular opacities throughout the lungs with an appearance and distribution suggestive of septic pulmonary emboli versus atypical infection. 4. Increasing consolidative opacity in the right lung base as well as heterogeneously enhancing atelectatic lung in the right lower lobe.  Could reflect a combination of atelectasis, infectious consolidation and pulmonary infarct in the distribution of filling defect described above. Small right pleural effusion and trace left effusion as well. Currently attempting to contact the ordering provider with a critical value result. Addendum will be submitted upon case discussion. Electronically Signed: By: Lovena Le M.D. On: 11/05/2020 22:53   DG Abd Portable 1V  Result Date: 11/06/2020 CLINICAL DATA:  Abdominal pain and distension. EXAM: PORTABLE ABDOMEN - 1 VIEW COMPARISON:  10/31/2020 CT FINDINGS: Nondistended gas-filled loops of small bowel and colon are noted. No dilated bowel loops are present. No suspicious calcifications are identified. An IUD is noted. No acute bony abnormalities are present. IMPRESSION: Nonspecific nonobstructive bowel gas pattern. Electronically Signed   By: Margarette Canada M.D.   On: 11/06/2020 11:34   VAS [place] LOWER EXTREMITY VENOUS (DVT)  Result Date: 11/06/2020  Lower Venous DVT Study Patient Name:  Chelsea Mathis  Date of Exam:   11/06/2020 Medical Rec #: [id number]       Accession #:    [id number] Date of Birth: 15-May-1990        Patient Gender: F Patient Age:   62 years Exam Location:  [facility] Procedure:      VAS [place] LOWER EXTREMITY VENOUS (DVT) Referring Phys: Irby Fails --------------------------------------------------------------------------------  Indications: Septic emboli, pulmonary embolism, and Swelling.  Risk Factors: MRSA, bacteremia. Limitations: Swelling, depth of vessels. Comparison Study: Prior negative right LEV done 10/23/20 Performing Technologist: Sharion Dove RVS  Examination Guidelines: A complete evaluation includes B-mode imaging, spectral Doppler, color Doppler, and power Doppler as needed of all accessible portions of each vessel. Bilateral testing is considered an integral part of a complete examination. Limited examinations for reoccurring indications may be performed as noted. The  reflux  portion of the exam is performed with the patient in reverse Trendelenburg.  +---------+---------------+---------+-----------+----------+--------------+ RIGHT    CompressibilityPhasicitySpontaneityPropertiesThrombus Aging +---------+---------------+---------+-----------+----------+--------------+ CFV      Full           Yes      Yes                                 +---------+---------------+---------+-----------+----------+--------------+ SFJ      Full                                                        +---------+---------------+---------+-----------+----------+--------------+ FV Prox  Full                                                        +---------+---------------+---------+-----------+----------+--------------+ FV Mid   Full                                                        +---------+---------------+---------+-----------+----------+--------------+ FV DistalFull                                                        +---------+---------------+---------+-----------+----------+--------------+ PFV      Full                                                        +---------+---------------+---------+-----------+----------+--------------+ POP      Full           Yes      Yes                                 +---------+---------------+---------+-----------+----------+--------------+ PTV      Full                                                        +---------+---------------+---------+-----------+----------+--------------+ PERO     Full                                                        +---------+---------------+---------+-----------+----------+--------------+ Soleal   Full                                                        +---------+---------------+---------+-----------+----------+--------------+   +---------+---------------+---------+-----------+----------+--------------+  LEFT      CompressibilityPhasicitySpontaneityPropertiesThrombus Aging +---------+---------------+---------+-----------+----------+--------------+ CFV      Full           Yes      Yes                                 +---------+---------------+---------+-----------+----------+--------------+ SFJ      Full                                                        +---------+---------------+---------+-----------+----------+--------------+ FV Prox  Full                                                        +---------+---------------+---------+-----------+----------+--------------+ FV Mid   Full                                                        +---------+---------------+---------+-----------+----------+--------------+ FV DistalFull                                                        +---------+---------------+---------+-----------+----------+--------------+ PFV      Full                                                        +---------+---------------+---------+-----------+----------+--------------+ POP      Full           Yes      Yes                                 +---------+---------------+---------+-----------+----------+--------------+ PTV      Full                                                        +---------+---------------+---------+-----------+----------+--------------+ PERO     Full                                                        +---------+---------------+---------+-----------+----------+--------------+     Summary: BILATERAL: - No evidence of deep vein thrombosis seen in the lower extremities, bilaterally. -No evidence of popliteal cyst, bilaterally.   *See table(s) above for measurements and observations. Electronically signed by Jamelle Haring on 11/06/2020 at 2:06:07 PM.  Final    VAS [place] UPPER EXTREMITY VENOUS DUPLEX  Result Date: 11/06/2020 UPPER VENOUS STUDY  Patient Name:  Chelsea Mathis  Date of Exam:   11/06/2020 Medical Rec  #: [id number]       Accession #:    [id number] Date of Birth: 1990/10/14        Patient Gender: F Patient Age:   1 years Exam Location:  [facility] Procedure:      VAS [place] UPPER EXTREMITY VENOUS DUPLEX Referring Phys: Harue Pribble --------------------------------------------------------------------------------  Indications: septic emboli, and pulmonary embolism Risk Factors: MRSA, endocarditis. Limitations: Depth of vessels and poor ultrasound/tissue interface. Comparison Study: No prior study Performing Technologist: Sharion Dove RVS  Examination Guidelines: A complete evaluation includes B-mode imaging, spectral Doppler, color Doppler, and power Doppler as needed of all accessible portions of each vessel. Bilateral testing is considered an integral part of a complete examination. Limited examinations for reoccurring indications may be performed as noted.  Right Findings: +----------+------------+---------+-----------+----------+--------------+ RIGHT     CompressiblePhasicitySpontaneousProperties   Summary     +----------+------------+---------+-----------+----------+--------------+ IJV           Full       Yes       Yes                             +----------+------------+---------+-----------+----------+--------------+ Subclavian               Yes       Yes                             +----------+------------+---------+-----------+----------+--------------+ Axillary                 Yes       Yes                             +----------+------------+---------+-----------+----------+--------------+ Brachial      Full       Yes       Yes                             +----------+------------+---------+-----------+----------+--------------+ Radial        Full       Yes       Yes                             +----------+------------+---------+-----------+----------+--------------+ Ulnar                                               Not visualized  +----------+------------+---------+-----------+----------+--------------+ Cephalic      Full                                                 +----------+------------+---------+-----------+----------+--------------+ Basilic       Full                                                 +----------+------------+---------+-----------+----------+--------------+  Left Findings: +----------+------------+---------+-----------+----------+---------------------+ LEFT      CompressiblePhasicitySpontaneousProperties       Summary        +----------+------------+---------+-----------+----------+---------------------+ IJV           Full       Yes       Yes                                    +----------+------------+---------+-----------+----------+---------------------+ Subclavian               Yes       Yes                                    +----------+------------+---------+-----------+----------+---------------------+ Axillary                 Yes       Yes                                    +----------+------------+---------+-----------+----------+---------------------+ Brachial      Full       Yes       Yes                                    +----------+------------+---------+-----------+----------+---------------------+ Radial        Full       Yes       Yes                                    +----------+------------+---------+-----------+----------+---------------------+ Ulnar                    Yes       Yes               patent by color and                                                             Doppler        +----------+------------+---------+-----------+----------+---------------------+ Cephalic      Full                                                        +----------+------------+---------+-----------+----------+---------------------+ Basilic       Full                                                         +----------+------------+---------+-----------+----------+---------------------+  Summary:  Right: No evidence of deep vein thrombosis in the upper extremity. No evidence of superficial vein thrombosis in the upper extremity.  Left: No evidence of deep vein thrombosis in the  upper extremity. No evidence of superficial vein thrombosis in the upper extremity.  *See table(s) above for measurements and observations.  Diagnosing physician: Jamelle Haring Electronically signed by Jamelle Haring on 11/06/2020 at 2:06:26 PM.    Final         Scheduled Meds:  Chlorhexidine Gluconate Cloth  6 each Topical Daily   docusate sodium  100 mg Oral BID   feeding supplement (GLUCERNA SHAKE)  237 mL Oral TID BM   insulin aspart  0-15 Units Subcutaneous TID WC   insulin aspart  10 Units Subcutaneous TID WC   insulin glargine-yfgn  35 Units Subcutaneous Daily   insulin starter kit- pen needles  1 kit Other Once   living well with diabetes book   Does not apply Once   multivitamin with minerals  1 tablet Oral Daily   mupirocin ointment   Nasal BID   polyethylene glycol  17 g Oral Daily   Continuous Infusions:  ceFEPime (MAXIPIME) IV 2 g (11/07/20 0902)   vancomycin 1,250 mg (11/07/20 2549)     LOS: 7 days     Cordelia Poche, MD [facility] 11/07/2020, 9:57 AM  If 7PM-7AM, please contact night-coverage [email or web address]

## 2020-11-08 ENCOUNTER — Encounter (HOSPITAL_COMMUNITY): Payer: Self-pay | Admitting: Internal Medicine

## 2020-11-08 DIAGNOSIS — M609 Myositis, unspecified: Secondary | ICD-10-CM

## 2020-11-08 LAB — GLUCOSE, CAPILLARY
Glucose-Capillary: 164 mg/dL — ABNORMAL HIGH (ref 70–99)
Glucose-Capillary: 136 mg/dL — ABNORMAL HIGH (ref 70–99)
Glucose-Capillary: 139 mg/dL — ABNORMAL HIGH (ref 70–99)
Glucose-Capillary: 160 mg/dL — ABNORMAL HIGH (ref 70–99)

## 2020-11-08 LAB — CBC
HCT: 20.6 % — ABNORMAL LOW (ref 36.0–46.0)
Hemoglobin: 6.6 g/dL — CL (ref 12.0–15.0)
MCH: 26.3 pg (ref 26.0–34.0)
MCHC: 32 g/dL (ref 30.0–36.0)
MCV: 82.1 fL (ref 80.0–100.0)
Platelets: 428 10*3/uL — ABNORMAL HIGH (ref 150–400)
RBC: 2.51 MIL/uL — ABNORMAL LOW (ref 3.87–5.11)
RDW: 15.7 % — ABNORMAL HIGH (ref 11.5–15.5)
WBC: 28.4 10*3/uL — ABNORMAL HIGH (ref 4.0–10.5)
nRBC: 0.2 % (ref 0.0–0.2)

## 2020-11-08 LAB — CULTURE, BLOOD (ROUTINE X 2)

## 2020-11-08 LAB — COMPREHENSIVE METABOLIC PANEL
ALT: 11 U/L (ref 0–44)
AST: 19 U/L (ref 15–41)
Albumin: 1.4 g/dL — ABNORMAL LOW (ref 3.5–5.0)
Alkaline Phosphatase: 86 U/L (ref 38–126)
Anion gap: 3 — ABNORMAL LOW (ref 5–15)
BUN: <5 mg/dL — ABNORMAL LOW (ref 6–20)
CO2: 31 mmol/L (ref 22–32)
Calcium: 7.6 mg/dL — ABNORMAL LOW (ref 8.9–10.3)
Chloride: 97 mmol/L — ABNORMAL LOW (ref 98–111)
Creatinine, Ser: 0.4 mg/dL — ABNORMAL LOW (ref 0.44–1.00)
GFR, Estimated: >60 mL/min (ref >60)
Glucose, Bld: 163 mg/dL — ABNORMAL HIGH (ref 70–99)
Potassium: 4 mmol/L (ref 3.5–5.1)
Sodium: 131 mmol/L — ABNORMAL LOW (ref 135–145)
Total Bilirubin: 1 mg/dL (ref 0.3–1.2)
Total Protein: 6.4 g/dL — ABNORMAL LOW (ref 6.5–8.1)

## 2020-11-08 LAB — HEMOGLOBIN AND HEMATOCRIT, BLOOD
HCT: 20.5 % — ABNORMAL LOW (ref 36.0–46.0)
Hemoglobin: 6.8 g/dL — CL (ref 12.0–15.0)

## 2020-11-08 LAB — RETICULOCYTES
Immature Retic Fract: 45.8 % — ABNORMAL HIGH (ref 2.3–15.9)
RBC.: 2.58 MIL/uL — ABNORMAL LOW (ref 3.87–5.11)
Retic Count, Absolute: 51.6 10*3/uL (ref 19.0–186.0)
Retic Ct Pct: 2 % (ref 0.4–3.1)

## 2020-11-08 LAB — PREPARE RBC (CROSSMATCH)

## 2020-11-08 MED ORDER — HYDROXYZINE HCL 25 MG PO TABS
25.0000 mg | ORAL_TABLET | Freq: Three times a day (TID) | ORAL | Status: DC | PRN
  Administered 2020-11-08 – 2020-11-09 (×3): 25 mg via ORAL
  Filled 2020-11-08 (×3): qty 1

## 2020-11-08 MED ORDER — BISACODYL 10 MG RE SUPP
10.0000 mg | Freq: Once | RECTAL | Status: DC
  Filled 2020-11-08: qty 1

## 2020-11-08 MED ORDER — SENNOSIDES-DOCUSATE SODIUM 8.6-50 MG PO TABS
1.0000 | ORAL_TABLET | Freq: Two times a day (BID) | ORAL | Status: DC
  Administered 2020-11-08 – 2020-11-11 (×7): 1 via ORAL
  Filled 2020-11-08 (×7): qty 1

## 2020-11-08 MED ORDER — POLYETHYLENE GLYCOL 3350 17 G PO PACK
17.0000 g | PACK | Freq: Two times a day (BID) | ORAL | Status: DC
  Administered 2020-11-08 – 2020-11-11 (×6): 17 g via ORAL
  Filled 2020-11-08 (×8): qty 1

## 2020-11-08 MED ORDER — VANCOMYCIN HCL 1750 MG/350ML IV SOLN
1750.0000 mg | Freq: Two times a day (BID) | INTRAVENOUS | Status: DC
  Administered 2020-11-08 – 2020-11-11 (×6): 1750 mg via INTRAVENOUS
  Filled 2020-11-08 (×7): qty 350

## 2020-11-08 MED ORDER — SODIUM CHLORIDE 0.9% IV SOLUTION: Freq: Once | INTRAVENOUS | Status: AC

## 2020-11-08 NOTE — Progress Notes (Signed)
Physical Therapy Treatment Patient Details Name: Chelsea Mathis MRN: [id number] DOB: Jun 20, 1990 Today's Date: 11/08/2020    History of Present Illness Pt is 30 y.o. female admitted with DKA, multiple electrolyte disturbances and severe sepsis secondary to bacteremia on 10/31/20. CTA (+) cavitary lung lesions. 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 8/6 acute PE likely related to infective source rather than blood clot in setting of endocarditis started on IV heparin. CT femur suggestive of myositis on R thigh. PMHx significant for poorly controlled DMII and recent UTI.    PT Comments    Treatment limited today due to Hgb on 6.8 and pt receiving PRBC.  Did perform bed level exercises.  Pt with difficulty and increased pain on R side compared to L.  All exercises to tolerance and with cues for relaxation and breathing.  Did discuss updated recommendation to CIR and pt in agreement.     Follow Up Recommendations  CIR     Equipment Recommendations  Rolling walker with 5" wheels;Wheelchair cushion (measurements PT);Wheelchair (measurements PT);3in1 (PT)    Recommendations for Other Services       Precautions / Restrictions Precautions Precautions: Fall    Mobility  Bed Mobility               General bed mobility comments: Held due to pt's hgb 6.8 and currently getting PRBC    Transfers                    Ambulation/Gait                 Stairs             Wheelchair Mobility    Modified Rankin (Stroke Patients Only)       Balance                                            Cognition Arousal/Alertness: Awake/alert Behavior During Therapy: WFL for tasks assessed/performed Overall Cognitive Status: Within Functional Limits for tasks assessed                                        Exercises General Exercises - Lower Extremity Ankle Circles/Pumps: AROM;Both;Supine;10 reps Short Arc Quad:  AROM;Both;Supine (10x2) Heel Slides: AROM;Left;AAROM;Right;Supine (10x2 to tolerance on R due to pain) Hip ABduction/ADduction: AAROM;Both;Supine (10x2 , cues for correct form ABD/ADD vs IR/ER)    General Comments        Pertinent Vitals/Pain Pain Assessment: Faces Faces Pain Scale: Hurts even more Pain Location: Bil Groin (R worse than L) with movement Pain Descriptors / Indicators: Aching;Sore;Sharp Pain Intervention(s): Limited activity within patient's tolerance;Monitored during session;Relaxation;Other (comment) (provided hot packs post tx)    Home Living                      Prior Function            PT Goals (current goals can now be found in the care plan section) Acute Rehab PT Goals Patient Stated Goal: To return home. PT Goal Formulation: With patient Time For Goal Achievement: 11/18/20 Potential to Achieve Goals: Good Progress towards PT goals: Progressing toward goals    Frequency    Min 3X/week      PT Plan Current plan remains appropriate  Co-evaluation              AM-PAC PT "6 Clicks" Mobility   Outcome Measure  Help needed turning from your back to your side while in a flat bed without using bedrails?: A Lot Help needed moving from lying on your back to sitting on the side of a flat bed without using bedrails?: A Lot Help needed moving to and from a bed to a chair (including a wheelchair)?: Total Help needed standing up from a chair using your arms (e.g., wheelchair or bedside chair)?: Total Help needed to walk in hospital room?: Total Help needed climbing 3-5 steps with a railing? : Total 6 Click Score: 8    End of Session   Activity Tolerance: Treatment limited secondary to medical complications (Comment) (getting PRBC; low Hgb) Patient left: in bed;with call bell/phone within reach;with family/visitor present (family present and leaning on bed at times; pt following commands; did not turn on alarm) Nurse Communication:  Mobility status;Patient requests pain meds PT Visit Diagnosis: Unsteadiness on feet (R26.81);Muscle weakness (generalized) (M62.81)     Time: 7893-8101 PT Time Calculation (min) (ACUTE ONLY): 20 min  Charges:  $Therapeutic Exercise: 8-22 mins                     Anise Salvo, PT Acute Rehab Services Pager [phone] [facility] [phone]    Rayetta Humphrey 11/08/2020, 2:37 PM

## 2020-11-08 NOTE — Progress Notes (Addendum)
Critical lab value pt H&H is 6.6 reported to provider Mansy,MD. No orders where made at this time for the pt.

## 2020-11-08 NOTE — Progress Notes (Signed)
OT Cancellation Note  Patient Details Name: Katty Fretwell MRN: [id number] DOB: 1990-08-12   Cancelled Treatment:    Reason Eval/Treat Not Completed: (P) Other (comment) (Pt recieving blood and declined. Will return at a  later time.)  [facility] 11/08/2020, 2:23 PM Luisa Dago, OT/L   Acute OT Clinical Specialist Acute Rehabilitation Services Pager [phone] Office [phone]

## 2020-11-08 NOTE — Progress Notes (Signed)
PROGRESS NOTE    Chelsea Mathis  JAS:[id number] DOB: 02-24-1991 DOA: 10/31/2020 PCP: Pcp, No   Brief Narrative: Chelsea Mathis is a 30 y.o. female with a history of diabetes mellitus type 2.  Patient presented secondary to elevated blood sugar, weakness, fatigue with evidence of sepsis.  She started on empiric biotics with blood cultures significant for Staph aureus.  She is also found to have a septic pulmonary emboli on CT imaging concerning for possible endocarditis.   Assessment & Plan:   Principal Problem:   Severe sepsis ([place]) Active Problems:   DKA (diabetic ketoacidosis) ([place])   Bacteremia due to Staphylococcus aureus   Septic pulmonary embolism ([facility])   Type 2 diabetes mellitus with hypoglycemia without coma ([place])   Myositis   Myofasciitis   Severe sepsis Present on admission. Patient admitted to ICU, but no need for vasopressor support. Cavitary lung lesions noted on CTA chest. Empirically started on Vancomycin/Clindamycin/Zosyn. Blood culture significant for staphylococcus aureus infection and transitioned to Vancomycin IV monotherapy. Leukocytosis seems to have reached a peak.  Persistent staphylococcus bacteremia Cavitary lung lesions Infectious disease consulted. Empiric antibiotics as above and now transitioned to Vancomycin IV monotherapy. Repeat chest x-ray slightly worsened, however, symptoms stable. 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 Repeat BCx (8/2) no growth however BCx (8/5 AND 8/6) positive for staphylococcus aureus.  -ID recommendations: Vancomycin IV, overall 6 week plan for antibiotics, repeat blood cultures today -Follow-up Blood cultures (8/8)  Acute pulmonary embolism Diagnosed 8/6. Likely related to infective source rather than blood clot in setting of endocarditis. Started on heparin IV with bolus overnight on 8/7 with resultant acute on chronic anemia. Heparin discontinued. Venous duplexes of all 4  extremities was negative for DVT. Again, since likely related to infectious embolism, in addition to recurrent anemia requiring transfusion and finally no identifiable extremity DVT, will not resume anticoagulation. Discussed with pulmonology who agree as well.  DKA In setting of infection and recent prednisone use. Patient presented with elevated glucose, anion gap beta-hydroxybutyric acid and low bicarbonate level. Patient started on insulin drip with rapid improvement in anion gap, CO2 and blood sugars. Transitioned off of insulin drip and to Lantus. Resolved.  Tachycardia In setting of acute illness. Fever last night. Sinus rhythm. Given IV fluids. Continues to be slightly tachycardic but stable. In light of worsened right sided chest pain and continued hypoxia (in setting of septic emboli), may need to consider PE. CTA chest significant for filling defect in lobar/segmental aspects of right lower lobe suggesting PE.  Acute on chronic anemia In setting of thrombocytopenia while on her menstrual cycle. Had been stable around 7.3-7.4 but acutely dropped after heparin IV. No source of bleeding identified at this time. Patient's menstrual cycle has ceased. 1 unit of PRBC ordered morning of 8/7. Hemoglobin down to 6.6 this morning. Prior reticulocyte count was low. Iron studies suggest anemia of acute illness/chronic disease; more likely acute illness in this setting. -Repeat H&H and transfuse if still below 7 -Repeat reticulocyte count, CMP  Thrombocytopenia Likely secondary to acute infection. Resolved.  Thrombocytosis Mild. Likely reactive in setting of infection.  Diabetes mellitus, type 2 History of diabetes; recently started on steroids. Patient's hemoglobin A1C is 12.3%. -Continue Semglee 35 units daily and Novolog 10 units TID with meals. Continue SSI with meals -Continue carb modified diet  Hyponatremia Severe. Sodium of 103 on admission, although repeat sodium 20 min later of 120.  Rapid increase with control of blood sugar. Most likely mainly secondary  to pseudohyponatremia from hyperglycemia. Improved slightly and seems stable.  AKI In setting of DKA and likely accompanying dehydration. Resolved with IV fluids.  Atrial fibrillation Appears to be transient in setting of infection. Started on Amiodarone with bolus and drip; this was discontinued. Currently in sinus rhythm.  Myositis/myofasciitis Secondary to infection. CK elevated on admission but repeat is normal. Aldolase is elevated. RF is mildly elevated and ANA is negative. Initial imaging without any specific findings. MRI of bilateral thighs significant for evidence of myositis/myofasciitis. Orthopedic surgery consulted and recommend no surgical intervention. Overall, symptoms are improving -Percocet prn  Abdominal pain Patient states pain has always been there but has worsened. Seems out of proportion. No associated bowel movements/hematochezia/melena. In setting of known septic emboli, however. No bowel movement since admission. No obstruction or other abnormalities seen on abdominal x-ray. Improved.   DVT prophylaxis: SCDs Code Status:   Code Status: Full Code Family Communication: None at bedside Disposition Plan: Discharge likely home in 2-5 days pending continued workup/management of bacteremia, improvement of sepsis, myalgias   Consultants:  PCCM Infectious disease Orthopedic surgery  Procedures:  TRANSTHORACIC ECHOCARDIOGRAM (11/01/2020) IMPRESSIONS     1. Left ventricular ejection fraction, by estimation, is 70 to 75%. The  left ventricle has hyperdynamic function. The left ventricle has no  regional wall motion abnormalities. Indeterminate diastolic filling due to  E-A fusion.   2. Right ventricular systolic function is normal. The right ventricular  size is normal.   3. The mitral valve is normal in structure. No evidence of mitral valve  regurgitation. No evidence of mitral stenosis.    4. The aortic valve is normal in structure. Aortic valve regurgitation is  not visualized. No aortic stenosis is present.   5. The inferior vena cava is normal in size with greater than 50%  respiratory variability, suggesting right atrial pressure of 3 mmHg.   Conclusion(s)/Recommendation(s): No evidence of valvular vegetations on  this transthoracic echocardiogram. Would recommend a transesophageal  echocardiogram to exclude infective endocarditis if clinically indicated  Antimicrobials: Vancomycin IV Zosyn IV Clindamycin IV    Subjective: No issues overnight. Afebrile overnight.  Objective: Vitals:   11/07/20 1811 11/07/20 2000 11/08/20 0018 11/08/20 0459  BP:  121/71 108/68 (!) 122/56  Pulse: (!) 118 (!) 120 (!) 115 (!) 122  Resp:      Temp:  98.6 F (37 C) 98.1 F (36.7 C) 98.3 F (36.8 C)  TempSrc:  Oral Oral Oral  SpO2: 99% 98% 99% 99%  Weight:      Height:        Intake/Output Summary (Last 24 hours) at 11/08/2020 0751 Last data filed at 11/08/2020 0501 Gross per 24 hour  Intake --  Output 400 ml  Net -400 ml    Filed Weights   11/03/20 1336 11/04/20 0410 11/07/20 0600  Weight: 98 kg 101.5 kg 101.7 kg    Examination:  General exam: Appears calm and comfortable Respiratory system: Respiratory effort normal. Gastrointestinal system: Abdomen is non-distended Central nervous system: Asleep Skin: No cyanosis. No rashes   Data Reviewed: I have personally reviewed following labs and imaging studies  CBC Lab Results  Component Value Date   WBC 28.4 (H) 11/08/2020   RBC 2.51 (L) 11/08/2020   HGB 6.6 (LL) 11/08/2020   HCT 20.6 (L) 11/08/2020   MCV 82.1 11/08/2020   MCH 26.3 11/08/2020   PLT 428 (H) 11/08/2020   MCHC 32.0 11/08/2020   RDW 15.7 (H) 11/08/2020   LYMPHSABS 3.0 11/06/2020  MONOABS 1.5 (H) 11/06/2020   EOSABS 0.4 11/06/2020   BASOSABS 0.0 43/15/4008     Last metabolic panel Lab Results  Component Value Date   NA 130 (L) 11/06/2020    K 4.2 11/06/2020   CL 94 (L) 11/06/2020   CO2 30 11/06/2020   BUN <5 (L) 11/06/2020   CREATININE 0.41 (L) 11/06/2020   GLUCOSE 139 (H) 11/06/2020   GFRNONAA >60 11/06/2020   GFRAA >60 11/10/2016   CALCIUM 7.3 (L) 11/06/2020   PHOS 3.5 11/03/2020   PROT 5.4 (L) 11/01/2020   ALBUMIN 1.5 (L) 11/01/2020   BILITOT 0.9 11/01/2020   ALKPHOS 106 11/01/2020   AST 55 (H) 11/01/2020   ALT 21 11/01/2020   ANIONGAP 6 11/06/2020    CBG (last 3)  Recent Labs    11/07/20 0754 11/07/20 1227 11/07/20 1748  GLUCAP 133* 219* 173*      GFR: Estimated Creatinine Clearance: 117.1 mL/min (A) (by C-G formula based on SCr of 0.41 mg/dL (L)).  Coagulation Profile: No results for input(s): INR, PROTIME in the last 168 hours.   Recent Results (from the past 240 hour(s))  Resp Panel by RT-PCR (Flu A&B, Covid) Nasopharyngeal Swab     Status: None   Collection Time: 10/31/20 10:53 AM   Specimen: Nasopharyngeal Swab; Nasopharyngeal(NP) swabs in vial transport medium  Result Value Ref Range Status   SARS Coronavirus 2 by RT PCR NEGATIVE NEGATIVE Final    Comment: (NOTE) SARS-CoV-2 target nucleic acids are NOT DETECTED.  The SARS-CoV-2 RNA is generally detectable in upper respiratory specimens during the acute phase of infection. The lowest concentration of SARS-CoV-2 viral copies this assay can detect is 138 copies/mL. A negative result does not preclude SARS-Cov-2 infection and should not be used as the sole basis for treatment or other patient management decisions. A negative result may occur with  improper specimen collection/handling, submission of specimen other than nasopharyngeal swab, presence of viral mutation(s) within the areas targeted by this assay, and inadequate number of viral copies(<138 copies/mL). A negative result must be combined with clinical observations, patient history, and epidemiological information. The expected result is Negative.  Fact Sheet for Patients:   EntrepreneurPulse.com.au  Fact Sheet for Healthcare Providers:  IncredibleEmployment.be  This test is no t yet approved or cleared by the [place] FDA and  has been authorized for detection and/or diagnosis of SARS-CoV-2 by FDA under an Emergency Use Authorization (EUA). This EUA will remain  in effect (meaning this test can be used) for the duration of the COVID-19 declaration under Section 564(b)(1) of the Act, 21 U.S.C.section 360bbb-3(b)(1), unless the authorization is terminated  or revoked sooner.       Influenza A by PCR NEGATIVE NEGATIVE Final   Influenza B by PCR NEGATIVE NEGATIVE Final    Comment: (NOTE) The Xpert Xpress SARS-CoV-2/FLU/RSV plus assay is intended as an aid in the diagnosis of influenza from Nasopharyngeal swab specimens and should not be used as a sole basis for treatment. Nasal washings and aspirates are unacceptable for Xpert Xpress SARS-CoV-2/FLU/RSV testing.  Fact Sheet for Patients: EntrepreneurPulse.com.au  Fact Sheet for Healthcare Providers: IncredibleEmployment.be  This test is not yet approved or cleared by the [place] FDA and has been authorized for detection and/or diagnosis of SARS-CoV-2 by FDA under an Emergency Use Authorization (EUA). This EUA will remain in effect (meaning this test can be used) for the duration of the COVID-19 declaration under Section 564(b)(1) of the Act, 21 U.S.C. section 360bbb-3(b)(1), unless the  authorization is terminated or revoked.  Performed at [facility], [place] [address], [place], [place] [address]   Blood culture (routine single)     Status: Abnormal   Collection Time: 10/31/20 11:00 AM   Specimen: BLOOD LEFT FOREARM  Result Value Ref Range Status   Specimen Description BLOOD LEFT FOREARM  Final   Special Requests   Final    BOTTLES DRAWN AEROBIC AND ANAEROBIC Blood Culture adequate volume   Culture  Setup  Time   Final    GRAM POSITIVE COCCI IN CLUSTERS IN BOTH AEROBIC AND ANAEROBIC BOTTLES Organism ID to follow CRITICAL RESULT CALLED TO, READ BACK BY AND VERIFIED WITH: JJerrilyn Cairo PHARMD, AT 0221 11/01/20 Rush Landmark Performed at [facility], [place] [address] [place], [place] [address]    Culture METHICILLIN RESISTANT STAPHYLOCOCCUS AUREUS (A)  Final   Report Status 11/03/2020 FINAL  Final   Organism ID, Bacteria METHICILLIN RESISTANT STAPHYLOCOCCUS AUREUS  Final      Susceptibility   Methicillin resistant staphylococcus aureus - MIC*    CIPROFLOXACIN >=8 RESISTANT Resistant     ERYTHROMYCIN >=8 RESISTANT Resistant     GENTAMICIN <=0.5 SENSITIVE Sensitive     OXACILLIN >=4 RESISTANT Resistant     TETRACYCLINE <=1 SENSITIVE Sensitive     VANCOMYCIN <=0.5 SENSITIVE Sensitive     TRIMETH/SULFA <=10 SENSITIVE Sensitive     CLINDAMYCIN <=0.25 SENSITIVE Sensitive     RIFAMPIN <=0.5 SENSITIVE Sensitive     Inducible Clindamycin NEGATIVE Sensitive     * METHICILLIN RESISTANT STAPHYLOCOCCUS AUREUS  Blood Culture ID Panel (Reflexed)     Status: Abnormal   Collection Time: 10/31/20 11:00 AM  Result Value Ref Range Status   Enterococcus faecalis NOT DETECTED NOT DETECTED Final   Enterococcus Faecium NOT DETECTED NOT DETECTED Final   Listeria monocytogenes NOT DETECTED NOT DETECTED Final   Staphylococcus species DETECTED (A) NOT DETECTED Final    Comment: CRITICAL RESULT CALLED TO, READ BACK BY AND VERIFIED WITH: J. LEDFORD PHARMD, AT 0221 11/01/20 D. VANHOOK    Staphylococcus aureus (BCID) DETECTED (A) NOT DETECTED Final    Comment: Methicillin (oxacillin)-resistant Staphylococcus aureus (MRSA). MRSA is predictably resistant to beta-lactam antibiotics (except ceftaroline). Preferred therapy is vancomycin unless clinically contraindicated. Patient requires contact precautions if  hospitalized. CRITICAL RESULT CALLED TO, READ BACK BY AND VERIFIED WITH: J. LEDFORD PHARMD, AT 0221 11/01/20 D.  VANHOOK    Staphylococcus epidermidis NOT DETECTED NOT DETECTED Final   Staphylococcus lugdunensis NOT DETECTED NOT DETECTED Final   Streptococcus species NOT DETECTED NOT DETECTED Final   Streptococcus agalactiae NOT DETECTED NOT DETECTED Final   Streptococcus pneumoniae NOT DETECTED NOT DETECTED Final   Streptococcus pyogenes NOT DETECTED NOT DETECTED Final   A.calcoaceticus-baumannii NOT DETECTED NOT DETECTED Final   Bacteroides fragilis NOT DETECTED NOT DETECTED Final   Enterobacterales NOT DETECTED NOT DETECTED Final   Enterobacter cloacae complex NOT DETECTED NOT DETECTED Final   Escherichia coli NOT DETECTED NOT DETECTED Final   Klebsiella aerogenes NOT DETECTED NOT DETECTED Final   Klebsiella oxytoca NOT DETECTED NOT DETECTED Final   Klebsiella pneumoniae NOT DETECTED NOT DETECTED Final   Proteus species NOT DETECTED NOT DETECTED Final   Salmonella species NOT DETECTED NOT DETECTED Final   Serratia marcescens NOT DETECTED NOT DETECTED Final   Haemophilus influenzae NOT DETECTED NOT DETECTED Final   Neisseria meningitidis NOT DETECTED NOT DETECTED Final   Pseudomonas aeruginosa NOT DETECTED NOT DETECTED Final   Stenotrophomonas maltophilia NOT DETECTED  NOT DETECTED Final   Candida albicans NOT DETECTED NOT DETECTED Final   Candida auris NOT DETECTED NOT DETECTED Final   Candida glabrata NOT DETECTED NOT DETECTED Final   Candida krusei NOT DETECTED NOT DETECTED Final   Candida parapsilosis NOT DETECTED NOT DETECTED Final   Candida tropicalis NOT DETECTED NOT DETECTED Final   Cryptococcus neoformans/gattii NOT DETECTED NOT DETECTED Final   Meth resistant mecA/C and MREJ DETECTED (A) NOT DETECTED Final    Comment: CRITICAL RESULT CALLED TO, READ BACK BY AND VERIFIED WITH: J. LEDFORD PHARMD, AT 0221 11/01/20 Rush Landmark Performed at [place] [address] [place], [place] [address]   Urine Culture     Status: Abnormal   Collection Time: 10/31/20  1:29 PM   Specimen:  In/Out Cath Urine  Result Value Ref Range Status   Specimen Description IN/OUT CATH URINE  Final   Special Requests   Final    NONE Performed at [facility], [place] [address], [place], [place] [address]    Culture MULTIPLE SPECIES PRESENT, SUGGEST RECOLLECTION (A)  Final   Report Status 11/01/2020 FINAL  Final  MRSA Next Gen by PCR, Nasal     Status: Abnormal   Collection Time: 10/31/20  7:50 PM   Specimen: Nasal Mucosa; Nasal Swab  Result Value Ref Range Status   MRSA by PCR Next Gen DETECTED (A) NOT DETECTED Final    Comment: RESULT CALLED TO, READ BACK BY AND VERIFIED WITH: WARNER,B RN AT 2144 10/31/2020 MITCHELL,L (NOTE) The GeneXpert MRSA Assay (FDA approved for NASAL specimens only), is one component of a comprehensive MRSA colonization surveillance program. It is not intended to diagnose MRSA infection nor to guide or monitor treatment for MRSA infections. Test performance is not FDA approved in patients less than 62 years old. Performed at [facility], [place] [address], [place], [place] [address]   Culture, blood (routine x 2)     Status: None   Collection Time: 11/01/20 10:22 AM   Specimen: BLOOD RIGHT FOREARM  Result Value Ref Range Status   Specimen Description BLOOD RIGHT FOREARM  Final   Special Requests   Final    BOTTLES DRAWN AEROBIC AND ANAEROBIC Blood Culture adequate volume   Culture   Final    NO GROWTH 5 DAYS Performed at [facility], [place] [address] [place], [place] [address]    Report Status 11/06/2020 FINAL  Final  Culture, blood (routine x 2)     Status: None   Collection Time: 11/01/20 10:29 AM   Specimen: BLOOD RIGHT HAND  Result Value Ref Range Status   Specimen Description BLOOD RIGHT HAND  Final   Special Requests   Final    BOTTLES DRAWN AEROBIC AND ANAEROBIC Blood Culture adequate volume   Culture   Final    NO GROWTH 5 DAYS Performed at [facility], [place] [address] [place], [place] [address]    Report Status  11/06/2020 FINAL  Final  Culture, blood (routine x 2)     Status: Abnormal   Collection Time: 11/04/20  5:50 AM   Specimen: BLOOD RIGHT HAND  Result Value Ref Range Status   Specimen Description BLOOD RIGHT HAND  Final   Special Requests   Final    BOTTLES DRAWN AEROBIC ONLY Blood Culture results may not be optimal due to an inadequate volume of blood received in culture bottles   Culture  Setup Time   Final  GRAM POSITIVE COCCI IN CLUSTERS AEROBIC BOTTLE ONLY CRITICAL RESULT CALLED TO, READ BACK BY AND VERIFIED WITH: L. SEAY,PHARMD 0211 11/05/2020 T. TYSOR    Culture (A)  Final    STAPHYLOCOCCUS AUREUS SUSCEPTIBILITIES PERFORMED ON PREVIOUS CULTURE WITHIN THE LAST 5 DAYS. Performed at [facility], [place] [address], [place], [place] [address]    Report Status 11/07/2020 FINAL  Final  Culture, blood (routine x 2)     Status: None (Preliminary result)   Collection Time: 11/04/20  6:00 AM   Specimen: BLOOD RIGHT HAND  Result Value Ref Range Status   Specimen Description BLOOD RIGHT HAND  Final   Special Requests   Final    BOTTLES DRAWN AEROBIC ONLY Blood Culture adequate volume   Culture   Final    NO GROWTH 4 DAYS Performed at [facility], [place] [address] [place], [place] [address]    Report Status PENDING  Incomplete  Culture, blood (routine x 2)     Status: Abnormal (Preliminary result)   Collection Time: 11/05/20  8:17 AM   Specimen: BLOOD  Result Value Ref Range Status   Specimen Description BLOOD RIGHT ANTECUBITAL  Final   Special Requests   Final    BOTTLES DRAWN AEROBIC AND ANAEROBIC Blood Culture results may not be optimal due to an inadequate volume of blood received in culture bottles   Culture  Setup Time   Final    GRAM POSITIVE COCCI IN CLUSTERS ANAEROBIC BOTTLE ONLY CRITICAL VALUE NOTED.  VALUE IS CONSISTENT WITH PREVIOUSLY REPORTED AND CALLED VALUE. Performed at [facility], [place] [address] [place], [place] [address]    Culture  STAPHYLOCOCCUS AUREUS (A)  Final   Report Status PENDING  Incomplete  Culture, blood (routine x 2)     Status: None (Preliminary result)   Collection Time: 11/05/20 10:06 AM   Specimen: BLOOD RIGHT HAND  Result Value Ref Range Status   Specimen Description BLOOD RIGHT HAND  Final   Special Requests   Final    BOTTLES DRAWN AEROBIC AND ANAEROBIC Blood Culture adequate volume   Culture   Final    NO GROWTH 3 DAYS Performed at [facility], [place] [address], [place], [place] [address]    Report Status PENDING  Incomplete  Culture, blood (routine x 2)     Status: None (Preliminary result)   Collection Time: 11/07/20 11:36 AM   Specimen: BLOOD  Result Value Ref Range Status   Specimen Description BLOOD RIGHT ANTECUBITAL  Final   Special Requests   Final    BOTTLES DRAWN AEROBIC ONLY Blood Culture adequate volume   Culture   Final    NO GROWTH < 24 HOURS Performed at [facility], [place] [address] [place], [place] [address]    Report Status PENDING  Incomplete  Culture, blood (routine x 2)     Status: None (Preliminary result)   Collection Time: 11/07/20 11:36 AM   Specimen: BLOOD RIGHT HAND  Result Value Ref Range Status   Specimen Description BLOOD RIGHT HAND  Final   Special Requests   Final    BOTTLES DRAWN AEROBIC ONLY Blood Culture adequate volume   Culture   Final    NO GROWTH < 24 HOURS Performed at [facility], [place] [address] [place], [place] [address]    Report Status PENDING  Incomplete         Radiology Studies: DG Abd Portable 1V  Result Date:  11/06/2020 CLINICAL DATA:  Abdominal pain and distension. EXAM: PORTABLE ABDOMEN - 1 VIEW COMPARISON:  10/31/2020 CT FINDINGS: Nondistended gas-filled loops of small bowel and colon are noted. No dilated bowel loops are present. No suspicious calcifications are identified. An IUD is noted. No acute bony abnormalities are present. IMPRESSION: Nonspecific nonobstructive bowel gas pattern. Electronically Signed    By: Margarette Canada M.D.   On: 11/06/2020 11:34   VAS [place] LOWER EXTREMITY VENOUS (DVT)  Result Date: 11/06/2020  Lower Venous DVT Study Patient Name:  Chelsea Mathis  Date of Exam:   11/06/2020 Medical Rec #: [id number]       Accession #:    [id number] Date of Birth: 08-10-1990        Patient Gender: F Patient Age:   89 years Exam Location:  [facility] Procedure:      VAS [place] LOWER EXTREMITY VENOUS (DVT) Referring Phys: Sayf Kerner --------------------------------------------------------------------------------  Indications: Septic emboli, pulmonary embolism, and Swelling.  Risk Factors: MRSA, bacteremia. Limitations: Swelling, depth of vessels. Comparison Study: Prior negative right LEV done 10/23/20 Performing Technologist: Sharion Dove RVS  Examination Guidelines: A complete evaluation includes B-mode imaging, spectral Doppler, color Doppler, and power Doppler as needed of all accessible portions of each vessel. Bilateral testing is considered an integral part of a complete examination. Limited examinations for reoccurring indications may be performed as noted. The reflux portion of the exam is performed with the patient in reverse Trendelenburg.  +---------+---------------+---------+-----------+----------+--------------+ RIGHT    CompressibilityPhasicitySpontaneityPropertiesThrombus Aging +---------+---------------+---------+-----------+----------+--------------+ CFV      Full           Yes      Yes                                 +---------+---------------+---------+-----------+----------+--------------+ SFJ      Full                                                        +---------+---------------+---------+-----------+----------+--------------+ FV Prox  Full                                                        +---------+---------------+---------+-----------+----------+--------------+ FV Mid   Full                                                         +---------+---------------+---------+-----------+----------+--------------+ FV DistalFull                                                        +---------+---------------+---------+-----------+----------+--------------+ PFV      Full                                                        +---------+---------------+---------+-----------+----------+--------------+  POP      Full           Yes      Yes                                 +---------+---------------+---------+-----------+----------+--------------+ PTV      Full                                                        +---------+---------------+---------+-----------+----------+--------------+ PERO     Full                                                        +---------+---------------+---------+-----------+----------+--------------+ Soleal   Full                                                        +---------+---------------+---------+-----------+----------+--------------+   +---------+---------------+---------+-----------+----------+--------------+ LEFT     CompressibilityPhasicitySpontaneityPropertiesThrombus Aging +---------+---------------+---------+-----------+----------+--------------+ CFV      Full           Yes      Yes                                 +---------+---------------+---------+-----------+----------+--------------+ SFJ      Full                                                        +---------+---------------+---------+-----------+----------+--------------+ FV Prox  Full                                                        +---------+---------------+---------+-----------+----------+--------------+ FV Mid   Full                                                        +---------+---------------+---------+-----------+----------+--------------+ FV DistalFull                                                         +---------+---------------+---------+-----------+----------+--------------+ PFV      Full                                                        +---------+---------------+---------+-----------+----------+--------------+  POP      Full           Yes      Yes                                 +---------+---------------+---------+-----------+----------+--------------+ PTV      Full                                                        +---------+---------------+---------+-----------+----------+--------------+ PERO     Full                                                        +---------+---------------+---------+-----------+----------+--------------+     Summary: BILATERAL: - No evidence of deep vein thrombosis seen in the lower extremities, bilaterally. -No evidence of popliteal cyst, bilaterally.   *See table(s) above for measurements and observations. Electronically signed by Jamelle Haring on 11/06/2020 at 2:06:07 PM.    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 Limitations: Depth of vessels and poor ultrasound/tissue interface. Comparison Study: No prior study Performing Technologist: Sharion Dove RVS  Examination Guidelines: A complete evaluation includes B-mode imaging, spectral Doppler, color Doppler, and power Doppler as needed of all accessible portions of each vessel. Bilateral testing is considered an integral part of a complete examination. Limited examinations for  reoccurring indications may be performed as noted.  Right Findings: +----------+------------+---------+-----------+----------+--------------+ RIGHT     CompressiblePhasicitySpontaneousProperties   Summary     +----------+------------+---------+-----------+----------+--------------+ IJV           Full       Yes       Yes                             +----------+------------+---------+-----------+----------+--------------+ Subclavian               Yes       Yes                             +----------+------------+---------+-----------+----------+--------------+ Axillary                 Yes       Yes                             +----------+------------+---------+-----------+----------+--------------+ Brachial      Full       Yes       Yes                             +----------+------------+---------+-----------+----------+--------------+  Radial        Full       Yes       Yes                             +----------+------------+---------+-----------+----------+--------------+ Ulnar                                               Not visualized +----------+------------+---------+-----------+----------+--------------+ Cephalic      Full                                                 +----------+------------+---------+-----------+----------+--------------+ Basilic       Full                                                 +----------+------------+---------+-----------+----------+--------------+  Left Findings: +----------+------------+---------+-----------+----------+---------------------+ LEFT      CompressiblePhasicitySpontaneousProperties       Summary        +----------+------------+---------+-----------+----------+---------------------+ IJV           Full       Yes       Yes                                    +----------+------------+---------+-----------+----------+---------------------+ Subclavian               Yes       Yes                                     +----------+------------+---------+-----------+----------+---------------------+ Axillary                 Yes       Yes                                    +----------+------------+---------+-----------+----------+---------------------+ Brachial      Full       Yes       Yes                                    +----------+------------+---------+-----------+----------+---------------------+ Radial        Full       Yes       Yes                                    +----------+------------+---------+-----------+----------+---------------------+ Ulnar                    Yes       Yes               patent by color and  Doppler        +----------+------------+---------+-----------+----------+---------------------+ Cephalic      Full                                                        +----------+------------+---------+-----------+----------+---------------------+ Basilic       Full                                                        +----------+------------+---------+-----------+----------+---------------------+  Summary:  Right: No evidence of deep vein thrombosis in the upper extremity. No evidence of superficial vein thrombosis in the upper extremity.  Left: No evidence of deep vein thrombosis in the upper extremity. No evidence of superficial vein thrombosis in the upper extremity.  *See table(s) above for measurements and observations.  Diagnosing physician: Jamelle Haring Electronically signed by Jamelle Haring on 11/06/2020 at 2:06:26 PM.    Final    ECHOCARDIOGRAM LIMITED  Result Date: 11/07/2020    ECHOCARDIOGRAM LIMITED REPORT   Patient Name:   Chelsea Mathis Date of Exam: 11/07/2020 Medical Rec #:  [id number]      Height:       64.0 in Accession #:    [id number]     Weight:       224.2 lb Date of Birth:  1990/08/23       BSA:          2.054 m Patient Age:    30 years       BP:            137/4 mmHg Patient Gender: F              HR:           120 bpm. Exam Location:  Inpatient Procedure: Limited Color Doppler, Limited Echo, Cardiac Doppler and 2D Echo Indications:    I26.02 Pulmonary embolus  History:        Patient has prior history of Echocardiogram examinations, most                 recent 08/03/2020. CT chest 11/05/20 which showed PE and RV/LV ratio                 1.5, Signs/Symptoms:Shortness of Breath and Chest Pain; Risk                 Factors:Diabetes. H/O Covid 10 in June. After a short recovery                 became very ill. Acute pulmonary embolism and past pulmonary                 embolism this hospitalization.  Sonographer:    Merrie Roof RDCS Referring Phys: [place]  1. Left ventricular ejection fraction, by estimation, is >75%. The left ventricle has hyperdynamic function. The left ventricle has no regional wall motion abnormalities.  2. Right ventricular systolic function is mildly reduced. The right ventricular size is normal. There is moderately elevated pulmonary artery systolic pressure.  3. The mitral valve is normal in structure. No evidence of mitral valve regurgitation. No evidence of mitral stenosis.  4. The aortic  valve is tricuspid. Aortic valve regurgitation is not visualized. No aortic stenosis is present.  5. The inferior vena cava is normal in size with greater than 50% respiratory variability, suggesting right atrial pressure of 3 mmHg. FINDINGS  Left Ventricle: Left ventricular ejection fraction, by estimation, is >75%. The left ventricle has hyperdynamic function. The left ventricle has no regional wall motion abnormalities. The left ventricular internal cavity size was normal in size. There is no left ventricular hypertrophy. Right Ventricle: The right ventricular size is normal.Right ventricular systolic function is mildly reduced. There is moderately elevated pulmonary artery systolic pressure. The tricuspid regurgitant velocity is  3.35 m/s, and with an assumed right atrial  pressure of 3 mmHg, the estimated right ventricular systolic pressure is 75.1 mmHg. Left Atrium: Left atrial size was normal in size. Right Atrium: Right atrial size was normal in size. Pericardium: There is no evidence of pericardial effusion. Mitral Valve: The mitral valve is normal in structure. No evidence of mitral valve stenosis. Tricuspid Valve: The tricuspid valve is normal in structure. Tricuspid valve regurgitation is mild . No evidence of tricuspid stenosis. Aortic Valve: The aortic valve is tricuspid. Aortic valve regurgitation is not visualized. No aortic stenosis is present. Pulmonic Valve: The pulmonic valve was normal in structure. Pulmonic valve regurgitation is trivial. No evidence of pulmonic stenosis. Aorta: The aortic root is normal in size and structure. Venous: The inferior vena cava is normal in size with greater than 50% respiratory variability, suggesting right atrial pressure of 3 mmHg. IAS/Shunts: The interatrial septum was not well visualized. RIGHT VENTRICLE RV Basal diam:  3.20 cm TRICUSPID VALVE TR Peak grad:   44.9 mmHg TR Vmax:        335.00 cm/s Kirk Ruths MD Electronically signed by Kirk Ruths MD Signature Date/Time: 11/07/2020/2:41:34 PM    Final         Scheduled Meds:  Chlorhexidine Gluconate Cloth  6 each Topical Daily   docusate sodium  100 mg Oral BID   feeding supplement (GLUCERNA SHAKE)  237 mL Oral TID BM   insulin aspart  0-15 Units Subcutaneous TID WC   insulin aspart  10 Units Subcutaneous TID WC   insulin glargine-yfgn  35 Units Subcutaneous Daily   insulin starter kit- pen needles  1 kit Other Once   living well with diabetes book   Does not apply Once   multivitamin with minerals  1 tablet Oral Daily   mupirocin ointment   Nasal BID   polyethylene glycol  17 g Oral Daily   Continuous Infusions:  ceFEPime (MAXIPIME) IV 2 g (11/08/20 0015)   vancomycin 1,750 mg (11/08/20 0501)     LOS: 8 days      Cordelia Poche, MD [facility] 11/08/2020, 7:51 AM  If 7PM-7AM, please contact night-coverage [email or web address]

## 2020-11-08 NOTE — Progress Notes (Signed)
Reviewed case with primary.  Tough case with staph bacteremia, known pulmonary emboli.  Has a filling defect on his RLL pulmonary artery that could very well just be an embolized vegetation.  Given clinical history, ongoing transfusion-dependent anemia, and negative extremity duplexes, agree with watchful waiting given risk/benefit.  No role for IVC filter at this time.  Re-challenge with AC at later date.  Available PRN  Myrla Halsted MD PCCM

## 2020-11-09 ENCOUNTER — Inpatient Hospital Stay (HOSPITAL_COMMUNITY): Payer: Medicaid Other

## 2020-11-09 DIAGNOSIS — M79602 Pain in left arm: Secondary | ICD-10-CM

## 2020-11-09 DIAGNOSIS — M7989 Other specified soft tissue disorders: Secondary | ICD-10-CM

## 2020-11-09 DIAGNOSIS — I2699 Other pulmonary embolism without acute cor pulmonale: Secondary | ICD-10-CM

## 2020-11-09 DIAGNOSIS — I480 Paroxysmal atrial fibrillation: Secondary | ICD-10-CM

## 2020-11-09 LAB — COMPREHENSIVE METABOLIC PANEL
ALT: 10 U/L (ref 0–44)
AST: 21 U/L (ref 15–41)
Albumin: 1.4 g/dL — ABNORMAL LOW (ref 3.5–5.0)
Alkaline Phosphatase: 85 U/L (ref 38–126)
Anion gap: 8 (ref 5–15)
BUN: <5 mg/dL — ABNORMAL LOW (ref 6–20)
CO2: 28 mmol/L (ref 22–32)
Calcium: 7.7 mg/dL — ABNORMAL LOW (ref 8.9–10.3)
Chloride: 95 mmol/L — ABNORMAL LOW (ref 98–111)
Creatinine, Ser: 0.37 mg/dL — ABNORMAL LOW (ref 0.44–1.00)
GFR, Estimated: >60 mL/min (ref >60)
Glucose, Bld: 100 mg/dL — ABNORMAL HIGH (ref 70–99)
Potassium: 3.9 mmol/L (ref 3.5–5.1)
Sodium: 131 mmol/L — ABNORMAL LOW (ref 135–145)
Total Bilirubin: 0.8 mg/dL (ref 0.3–1.2)
Total Protein: 6.4 g/dL — ABNORMAL LOW (ref 6.5–8.1)

## 2020-11-09 LAB — TYPE AND SCREEN
ABO/RH(D): O POS
Antibody Screen: NEGATIVE
Unit division: 0
Unit division: 0

## 2020-11-09 LAB — CBC
HCT: 23.2 % — ABNORMAL LOW (ref 36.0–46.0)
Hemoglobin: 7.5 g/dL — ABNORMAL LOW (ref 12.0–15.0)
MCH: 26.9 pg (ref 26.0–34.0)
MCHC: 32.3 g/dL (ref 30.0–36.0)
MCV: 83.2 fL (ref 80.0–100.0)
Platelets: 467 10*3/uL — ABNORMAL HIGH (ref 150–400)
RBC: 2.79 MIL/uL — ABNORMAL LOW (ref 3.87–5.11)
RDW: 16.4 % — ABNORMAL HIGH (ref 11.5–15.5)
WBC: 23.8 10*3/uL — ABNORMAL HIGH (ref 4.0–10.5)
nRBC: 0.3 % — ABNORMAL HIGH (ref 0.0–0.2)

## 2020-11-09 LAB — GLUCOSE, CAPILLARY
Glucose-Capillary: 89 mg/dL (ref 70–99)
Glucose-Capillary: 128 mg/dL — ABNORMAL HIGH (ref 70–99)
Glucose-Capillary: 104 mg/dL — ABNORMAL HIGH (ref 70–99)

## 2020-11-09 LAB — CULTURE, BLOOD (ROUTINE X 2)
Culture: NO GROWTH
Special Requests: ADEQUATE

## 2020-11-09 LAB — BPAM RBC
Blood Product Expiration Date: 202209102359
Blood Product Expiration Date: 202209112359
ISSUE DATE / TIME: 202208071006
ISSUE DATE / TIME: 202208091221
Unit Type and Rh: 5100
Unit Type and Rh: 5100

## 2020-11-09 MED ORDER — SODIUM CHLORIDE 0.9% FLUSH
10.0000 mL | Freq: Two times a day (BID) | INTRAVENOUS | Status: DC
  Administered 2020-11-09 – 2020-11-11 (×4): 10 mL

## 2020-11-09 MED ORDER — SODIUM CHLORIDE 0.9% FLUSH: 10.0000 mL | INTRAVENOUS | Status: DC | PRN

## 2020-11-09 NOTE — Progress Notes (Signed)
LUE venous duplex has been completed.  Preliminary results given to Nicholaus Bloom, RN.  Results can be found under chart review under CV PROC. 11/09/2020 4:25 PM Shonta Bourque RVT, RDMS

## 2020-11-09 NOTE — Progress Notes (Signed)
PROGRESS NOTE   Chelsea Mathis  JGO:[id number] DOB: [birth year] DOA: 10/31/2020 PCP: Pcp, No   Brief Narrative:  Chelsea Mathis is a 30 y.o. female with past medical history of diabetes mellitus type 2 who presented to the hospital with weakness fatigue and features of sepsis.  Patient was noted to have a staff aureus bacteremia and was started on IV antibiotic.  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  Infectious disease was consulted.  Patient was continued on IV antibiotic.    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  Patient ambulatory cultures significant for staph virus infection.  Currently on vancomycin IV.  ID has seen the patient.  Recent cultures from 11/07/2020 negative so far.  Leukocytosis trended down  CBC Latest Ref Rng & Units 11/09/2020 11/08/2020 11/08/2020  WBC 4.0 - 10.5 K/uL 23.8(H) - 28.4(H)  Hemoglobin 12.0 - 15.0 g/dL 7.5(L) 6.8(LL) 6.6(LL)  Hematocrit 36.0 - 46.0 % 23.2(L) 20.5(L) 20.6(L)  Platelets 150 - 400 K/uL 467(H) - 428(H)     Persistent staphylococcus bacteremia with cavitary lung lesions Infectious disease was consulted.  On vancomycin IV.   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 Repeat blood culture from 8/5 AND 8/6 were positive for staphylococcus aureus.  Repeat blood cultures from 11/17/2020 negative so far.  ID recommends vancomycin for 6 weeks.  Acute pulmonary embolism Thought to be secondary to septic pulmonary emboli.  Initially was on heparin which was discontinued.  Patient developed anemia.  No DVT in the lower  extremity.  Pulmonary was involved and there is no recommendation for ongoing anticoagulation.  .  Diabetic ketoacidosis Present on admission likely secondary to infection and recent peritoneal dialysis.  Was initially on insulin drip.  Has been transitioned to Lantus at this time.  We will continue Accu-Cheks, diabetic diet close monitoring of blood glucose levels.  Tachycardia In the setting of acute illness and septic emboli.  We will continue to monitor closely.  Acute on chronic anemia Off anticoagulation at this time.  Received 1 unit of packed RBC during hospitalization.  Latest hemoglobin of 7.5.  Transfuse if less than 7.  Patient did have transient thrombocytopenia while on her menstrual cycle as well.    Thrombocytopenia Likely secondary to acute infection. Resolved at this time.  Now thrombocytosis.  Diabetes mellitus, type 2 Hemoglobin A1c of 12.3.  Continue long-acting insulin and NovoLog.  Continue sliding scale insulin.    Hyponatremia Improving.  Sodium of 131  Acute kidney injury. Resolved with IV fluids.  Latest creatinine of 0.3  Paroxysmal atrial fibrillation Was initially on amiodarone bolus and drip.  Currently in sinus rhythm.  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  Aldolase was elevated.  ANA negative.  MRI of the bilateral thighs significant for myositis/myofasciitis.  Orthopedics was consulted and recommended conservative treatment.  Overall improving.    Abdominal pain Nonspecific.  Abdominal x-ray was negative.  DVT prophylaxis: SCDs  Code Status: Full code  Family Communication: None   Disposition Plan:  CIR,  Physical therapy has seen the patient yesterday.  Consultants:  PCCM Infectious disease Orthopedic  surgery  Procedures:  TRANSTHORACIC ECHOCARDIOGRAM (11/01/2020) IMPRESSIONS    1. Left ventricular ejection fraction, by estimation, is 70 to 75%. The  left ventricle has hyperdynamic function. The  left ventricle has no  regional wall motion abnormalities. Indeterminate diastolic filling due to  E-A fusion.   2. Right ventricular systolic function is normal. The right ventricular  size is normal.   3. The mitral valve is normal in structure. No evidence of mitral valve  regurgitation. No evidence of mitral stenosis.   4. The aortic valve is normal in structure. Aortic valve regurgitation is  not visualized. No aortic stenosis is present.   5. The inferior vena cava is normal in size with greater than 50%  respiratory variability, suggesting right atrial pressure of 3 mmHg.   Conclusion(s)/Recommendation(s): No evidence of valvular vegetations on  this transthoracic echocardiogram. Would recommend a transesophageal  echocardiogram to exclude infective endocarditis if clinically indicated  Antimicrobials: Vancomycin IV   Subjective: Patient was seen and examined at bedside. Complains of weakness, leg pain and mild shortness of breath.  Objective: Vitals:   11/09/20 0000 11/09/20 0413 11/09/20 0537 11/09/20 0722  BP:  131/69  125/67  Pulse:    (!) 108  Resp:  (!) 22  19  Temp: 99.3 F (37.4 C) 99.1 F (37.3 C)  97.9 F (36.6 C)  TempSrc: Oral Oral  Oral  SpO2:    98%  Weight:   103.5 kg   Height:        Intake/Output Summary (Last 24 hours) at 11/09/2020 0740 Last data filed at 11/09/2020 0700 Gross per 24 hour  Intake 345.83 ml  Output 4201 ml  Net -3855.17 ml    Filed Weights   11/04/20 0410 11/07/20 0600 11/09/20 0537  Weight: 101.5 kg 101.7 kg 103.5 kg    General:  Average built, not in obvious distress, on nasal canula HENT:   No scleral pallor or icterus noted. Oral mucosa is moist.  Chest:    Diminished breath sounds bilaterally. Coarse breath sounds. CVS: S1 &S2 heard. No murmur.  Regular rate and rhythm. Abdomen: Soft, nontender, nondistended.  Bowel sounds are heard.   Extremities: No cyanosis, clubbing  but non specific tenderness noted.   Peripheral pulses are palpable. Psych: Alert, awake and oriented, normal mood CNS:  No cranial nerve deficits.  Power equal in all extremities.   Skin: Warm and dry.  No rashes noted.   Data Reviewed: I have reviewed the following labs and imaging studies  CBC Lab Results  Component Value Date   WBC 23.8 (H) 11/09/2020   RBC 2.79 (L) 11/09/2020   HGB 7.5 (L) 11/09/2020   HCT 23.2 (L) 11/09/2020   MCV 83.2 11/09/2020   MCH 26.9 11/09/2020   PLT 467 (H) 11/09/2020   MCHC 32.3 11/09/2020   RDW 16.4 (H) 11/09/2020   LYMPHSABS 3.0 11/06/2020   MONOABS 1.5 (H) 11/06/2020   EOSABS 0.4 11/06/2020   BASOSABS 0.0 73/53/2992     Last metabolic panel Lab Results  Component Value Date   NA 131 (L) 11/09/2020   K 3.9 11/09/2020   CL 95 (L) 11/09/2020   CO2 28 11/09/2020   BUN <5 (L) 11/09/2020   CREATININE 0.37 (L) 11/09/2020   GLUCOSE 100 (H) 11/09/2020   GFRNONAA >60 11/09/2020   GFRAA >60 11/10/2016   CALCIUM 7.7 (L) 11/09/2020   PHOS 3.5 11/03/2020   PROT 6.4 (L) 11/09/2020   ALBUMIN 1.4 (L) 11/09/2020   BILITOT 0.8 11/09/2020  ALKPHOS 85 11/09/2020   AST 21 11/09/2020   ALT 10 11/09/2020   ANIONGAP 8 11/09/2020    CBG (last 3)  Recent Labs    11/08/20 1605 11/08/20 2036 11/09/20 0725  GLUCAP 139* 160* 89      GFR: Estimated Creatinine Clearance: 118.3 mL/min (A) (by C-G formula based on SCr of 0.37 mg/dL (L)).  Coagulation Profile: No results for input(s): INR, PROTIME in the last 168 hours.   Recent Results (from the past 240 hour(s))  Resp Panel by RT-PCR (Flu A&B, Covid) Nasopharyngeal Swab     Status: None   Collection Time: 10/31/20 10:53 AM   Specimen: Nasopharyngeal Swab; Nasopharyngeal(NP) swabs in vial transport medium  Result Value Ref Range Status   SARS Coronavirus 2 by RT PCR NEGATIVE NEGATIVE Final    Comment: (NOTE) SARS-CoV-2 target nucleic acids are NOT DETECTED.  The SARS-CoV-2 RNA is generally detectable in upper  respiratory specimens during the acute phase of infection. The lowest concentration of SARS-CoV-2 viral copies this assay can detect is 138 copies/mL. A negative result does not preclude SARS-Cov-2 infection and should not be used as the sole basis for treatment or other patient management decisions. A negative result may occur with  improper specimen collection/handling, submission of specimen other than nasopharyngeal swab, presence of viral mutation(s) within the areas targeted by this assay, and inadequate number of viral copies(<138 copies/mL). A negative result must be combined with clinical observations, patient history, and epidemiological information. The expected result is Negative.  Fact Sheet for Patients:  EntrepreneurPulse.com.au  Fact Sheet for Healthcare Providers:  IncredibleEmployment.be  This test is no t yet approved or cleared by the [place] FDA and  has been authorized for detection and/or diagnosis of SARS-CoV-2 by FDA under an Emergency Use Authorization (EUA). This EUA will remain  in effect (meaning this test can be used) for the duration of the COVID-19 declaration under Section 564(b)(1) of the Act, 21 U.S.C.section 360bbb-3(b)(1), unless the authorization is terminated  or revoked sooner.       Influenza A by PCR NEGATIVE NEGATIVE Final   Influenza B by PCR NEGATIVE NEGATIVE Final    Comment: (NOTE) The Xpert Xpress SARS-CoV-2/FLU/RSV plus assay is intended as an aid in the diagnosis of influenza from Nasopharyngeal swab specimens and should not be used as a sole basis for treatment. Nasal washings and aspirates are unacceptable for Xpert Xpress SARS-CoV-2/FLU/RSV testing.  Fact Sheet for Patients: EntrepreneurPulse.com.au  Fact Sheet for Healthcare Providers: IncredibleEmployment.be  This test is not yet approved or cleared by the [place] FDA and has been  authorized for detection and/or diagnosis of SARS-CoV-2 by FDA under an Emergency Use Authorization (EUA). This EUA will remain in effect (meaning this test can be used) for the duration of the COVID-19 declaration under Section 564(b)(1) of the Act, 21 U.S.C. section 360bbb-3(b)(1), unless the authorization is terminated or revoked.  Performed at [facility], [place] [address] [place], [place] [address]   Blood culture (routine single)     Status: Abnormal   Collection Time: 10/31/20 11:00 AM   Specimen: BLOOD LEFT FOREARM  Result Value Ref Range Status   Specimen Description BLOOD LEFT FOREARM  Final   Special Requests   Final    BOTTLES DRAWN AEROBIC AND ANAEROBIC Blood Culture adequate volume   Culture  Setup Time   Final    GRAM POSITIVE COCCI IN CLUSTERS IN BOTH AEROBIC AND ANAEROBIC BOTTLES Organism ID to follow CRITICAL RESULT CALLED  TO, READ BACK BY AND VERIFIED WITH: Karsten Ro PHARMD, AT 0221 11/01/20 Rush Landmark Performed at [facility], [place] [address] [place], [place] [address]    Culture METHICILLIN RESISTANT STAPHYLOCOCCUS AUREUS (A)  Final   Report Status 11/03/2020 FINAL  Final   Organism ID, Bacteria METHICILLIN RESISTANT STAPHYLOCOCCUS AUREUS  Final      Susceptibility   Methicillin resistant staphylococcus aureus - MIC*    CIPROFLOXACIN >=8 RESISTANT Resistant     ERYTHROMYCIN >=8 RESISTANT Resistant     GENTAMICIN <=0.5 SENSITIVE Sensitive     OXACILLIN >=4 RESISTANT Resistant     TETRACYCLINE <=1 SENSITIVE Sensitive     VANCOMYCIN <=0.5 SENSITIVE Sensitive     TRIMETH/SULFA <=10 SENSITIVE Sensitive     CLINDAMYCIN <=0.25 SENSITIVE Sensitive     RIFAMPIN <=0.5 SENSITIVE Sensitive     Inducible Clindamycin NEGATIVE Sensitive     * METHICILLIN RESISTANT STAPHYLOCOCCUS AUREUS  Blood Culture ID Panel (Reflexed)     Status: Abnormal   Collection Time: 10/31/20 11:00 AM  Result Value Ref Range Status   Enterococcus faecalis NOT DETECTED NOT DETECTED  Final   Enterococcus Faecium NOT DETECTED NOT DETECTED Final   Listeria monocytogenes NOT DETECTED NOT DETECTED Final   Staphylococcus species DETECTED (A) NOT DETECTED Final    Comment: CRITICAL RESULT CALLED TO, READ BACK BY AND VERIFIED WITH: J. LEDFORD PHARMD, AT 0221 11/01/20 D. VANHOOK    Staphylococcus aureus (BCID) DETECTED (A) NOT DETECTED Final    Comment: Methicillin (oxacillin)-resistant Staphylococcus aureus (MRSA). MRSA is predictably resistant to beta-lactam antibiotics (except ceftaroline). Preferred therapy is vancomycin unless clinically contraindicated. Patient requires contact precautions if  hospitalized. CRITICAL RESULT CALLED TO, READ BACK BY AND VERIFIED WITH: J. LEDFORD PHARMD, AT 0221 11/01/20 D. VANHOOK    Staphylococcus epidermidis NOT DETECTED NOT DETECTED Final   Staphylococcus lugdunensis NOT DETECTED NOT DETECTED Final   Streptococcus species NOT DETECTED NOT DETECTED Final   Streptococcus agalactiae NOT DETECTED NOT DETECTED Final   Streptococcus pneumoniae NOT DETECTED NOT DETECTED Final   Streptococcus pyogenes NOT DETECTED NOT DETECTED Final   A.calcoaceticus-baumannii NOT DETECTED NOT DETECTED Final   Bacteroides fragilis NOT DETECTED NOT DETECTED Final   Enterobacterales NOT DETECTED NOT DETECTED Final   Enterobacter cloacae complex NOT DETECTED NOT DETECTED Final   Escherichia coli NOT DETECTED NOT DETECTED Final   Klebsiella aerogenes NOT DETECTED NOT DETECTED Final   Klebsiella oxytoca NOT DETECTED NOT DETECTED Final   Klebsiella pneumoniae NOT DETECTED NOT DETECTED Final   Proteus species NOT DETECTED NOT DETECTED Final   Salmonella species NOT DETECTED NOT DETECTED Final   Serratia marcescens NOT DETECTED NOT DETECTED Final   Haemophilus influenzae NOT DETECTED NOT DETECTED Final   Neisseria meningitidis NOT DETECTED NOT DETECTED Final   Pseudomonas aeruginosa NOT DETECTED NOT DETECTED Final   Stenotrophomonas maltophilia NOT DETECTED NOT  DETECTED Final   Candida albicans NOT DETECTED NOT DETECTED Final   Candida auris NOT DETECTED NOT DETECTED Final   Candida glabrata NOT DETECTED NOT DETECTED Final   Candida krusei NOT DETECTED NOT DETECTED Final   Candida parapsilosis NOT DETECTED NOT DETECTED Final   Candida tropicalis NOT DETECTED NOT DETECTED Final   Cryptococcus neoformans/gattii NOT DETECTED NOT DETECTED Final   Meth resistant mecA/C and MREJ DETECTED (A) NOT DETECTED Final    Comment: CRITICAL RESULT CALLED TO, READ BACK BY AND VERIFIED WITH: J. LEDFORD PHARMD, AT 0221 11/01/20 Rush Landmark Performed at [facility], [address] [place], [place] [address]   Urine Culture     Status: Abnormal   Collection Time: 10/31/20  1:29 PM   Specimen: In/Out Cath Urine  Result Value Ref Range Status   Specimen Description IN/OUT CATH URINE  Final   Special Requests   Final    NONE Performed at [facility], [place] [address] [place], [place] [address]    Culture MULTIPLE SPECIES PRESENT, SUGGEST RECOLLECTION (A)  Final   Report Status 11/01/2020 FINAL  Final  MRSA Next Gen by PCR, Nasal     Status: Abnormal   Collection Time: 10/31/20  7:50 PM   Specimen: Nasal Mucosa; Nasal Swab  Result Value Ref Range Status   MRSA by PCR Next Gen DETECTED (A) NOT DETECTED Final    Comment: RESULT CALLED TO, READ BACK BY AND VERIFIED WITH: WARNER,B RN AT 2144 10/31/2020 MITCHELL,L (NOTE) The GeneXpert MRSA Assay (FDA approved for NASAL specimens only), is one component of a comprehensive MRSA colonization surveillance program. It is not intended to diagnose MRSA infection nor to guide or monitor treatment for MRSA infections. Test performance is not FDA approved in patients less than 36 years old. Performed at [facility], [place] [address], [place], [place] [address]   Culture, blood (routine x 2)     Status: None   Collection Time: 11/01/20 10:22 AM   Specimen: BLOOD RIGHT FOREARM  Result Value Ref Range Status    Specimen Description BLOOD RIGHT FOREARM  Final   Special Requests   Final    BOTTLES DRAWN AEROBIC AND ANAEROBIC Blood Culture adequate volume   Culture   Final    NO GROWTH 5 DAYS Performed at [facility], [place] [address], [place], [place] [address]    Report Status 11/06/2020 FINAL  Final  Culture, blood (routine x 2)     Status: None   Collection Time: 11/01/20 10:29 AM   Specimen: BLOOD RIGHT HAND  Result Value Ref Range Status   Specimen Description BLOOD RIGHT HAND  Final   Special Requests   Final    BOTTLES DRAWN AEROBIC AND ANAEROBIC Blood Culture adequate volume   Culture   Final    NO GROWTH 5 DAYS Performed at [facility], [place] [address] [place], [place] [address]    Report Status 11/06/2020 FINAL  Final  Culture, blood (routine x 2)     Status: Abnormal   Collection Time: 11/04/20  5:50 AM   Specimen: BLOOD RIGHT HAND  Result Value Ref Range Status   Specimen Description BLOOD RIGHT HAND  Final   Special Requests   Final    BOTTLES DRAWN AEROBIC ONLY Blood Culture results may not be optimal due to an inadequate volume of blood received in culture bottles   Culture  Setup Time   Final    GRAM POSITIVE COCCI IN CLUSTERS AEROBIC BOTTLE ONLY CRITICAL RESULT CALLED TO, READ BACK BY AND VERIFIED WITH: L. SEAY,PHARMD 0211 11/05/2020 T. TYSOR    Culture (A)  Final    STAPHYLOCOCCUS AUREUS SUSCEPTIBILITIES PERFORMED ON PREVIOUS CULTURE WITHIN THE LAST 5 DAYS. Performed at [facility], [place] [address] [place], [place] [address]    Report Status 11/07/2020 FINAL  Final  Culture, blood (routine x 2)     Status: None (Preliminary result)   Collection Time: 11/04/20  6:00 AM   Specimen: BLOOD RIGHT HAND  Result Value Ref Range Status   Specimen Description BLOOD RIGHT  HAND  Final   Special Requests   Final    BOTTLES DRAWN AEROBIC ONLY Blood Culture adequate volume   Culture   Final    NO GROWTH 4 DAYS Performed at [facility], [address], [place], [place] [address]    Report Status PENDING  Incomplete  Culture, blood (routine x 2)     Status: Abnormal   Collection Time: 11/05/20  8:17 AM   Specimen: BLOOD  Result Value Ref Range Status   Specimen Description BLOOD RIGHT ANTECUBITAL  Final   Special Requests   Final    BOTTLES DRAWN AEROBIC AND ANAEROBIC Blood Culture results may not be optimal due to an inadequate volume of blood received in culture bottles   Culture  Setup Time   Final    GRAM POSITIVE COCCI IN CLUSTERS ANAEROBIC BOTTLE ONLY CRITICAL VALUE NOTED.  VALUE IS CONSISTENT WITH PREVIOUSLY REPORTED AND CALLED VALUE. Performed at [facility], [place] [address] [place], [place] [address]    Culture METHICILLIN RESISTANT STAPHYLOCOCCUS AUREUS (A)  Final   Report Status 11/08/2020 FINAL  Final   Organism ID, Bacteria METHICILLIN RESISTANT STAPHYLOCOCCUS AUREUS  Final      Susceptibility   Methicillin resistant staphylococcus aureus - MIC*    CIPROFLOXACIN >=8 RESISTANT Resistant     ERYTHROMYCIN >=8 RESISTANT Resistant     GENTAMICIN <=0.5 SENSITIVE Sensitive     OXACILLIN >=4 RESISTANT Resistant     TETRACYCLINE <=1 SENSITIVE Sensitive     VANCOMYCIN 1 SENSITIVE Sensitive     TRIMETH/SULFA <=10 SENSITIVE Sensitive     CLINDAMYCIN <=0.25 SENSITIVE Sensitive     RIFAMPIN <=0.5 SENSITIVE Sensitive     Inducible Clindamycin NEGATIVE Sensitive     * METHICILLIN RESISTANT STAPHYLOCOCCUS AUREUS  Culture, blood (routine x 2)     Status: None (Preliminary result)   Collection Time: 11/05/20 10:06 AM   Specimen: BLOOD RIGHT HAND  Result Value Ref Range Status   Specimen Description BLOOD RIGHT HAND  Final   Special Requests   Final    BOTTLES DRAWN AEROBIC AND ANAEROBIC Blood Culture adequate volume   Culture   Final    NO GROWTH 3 DAYS Performed at [facility], [address] [place], [place] [address]    Report Status PENDING  Incomplete  Culture, blood (routine x 2)     Status: None  (Preliminary result)   Collection Time: 11/07/20 11:36 AM   Specimen: BLOOD  Result Value Ref Range Status   Specimen Description BLOOD RIGHT ANTECUBITAL  Final   Special Requests   Final    BOTTLES DRAWN AEROBIC ONLY Blood Culture adequate volume   Culture   Final    NO GROWTH < 24 HOURS Performed at [facility], [place] [address], [place], [place] [address]    Report Status PENDING  Incomplete  Culture, blood (routine x 2)     Status: None (Preliminary result)   Collection Time: 11/07/20 11:36 AM   Specimen: BLOOD RIGHT HAND  Result Value Ref Range Status   Specimen Description BLOOD RIGHT HAND  Final   Special Requests   Final    BOTTLES DRAWN AEROBIC ONLY Blood Culture adequate volume   Culture   Final    NO GROWTH < 24 HOURS Performed at [facility], [place] [address] [place], [place] [address]    Report Status PENDING  Incomplete     Radiology Studies: ECHOCARDIOGRAM LIMITED  Result  Date: 11/07/2020    ECHOCARDIOGRAM LIMITED REPORT   Patient Name:   TRISTA CIOCCA Date of Exam: 11/07/2020 Medical Rec #:  [id number]      Height:       64.0 in Accession #:    [id number]     Weight:       224.2 lb Date of Birth:  [birth year]       BSA:          2.054 m Patient Age:    37 years       BP:           137/4 mmHg Patient Gender: F              HR:           120 bpm. Exam Location:  Inpatient Procedure: Limited Color Doppler, Limited Echo, Cardiac Doppler and 2D Echo Indications:    I26.02 Pulmonary embolus  History:        Patient has prior history of Echocardiogram examinations, most                 recent 08/03/2020. CT chest 11/05/20 which showed PE and RV/LV ratio                 1.5, Signs/Symptoms:Shortness of Breath and Chest Pain; Risk                 Factors:Diabetes. H/O Covid 49 in June. After a short recovery                 became very ill. Acute pulmonary embolism and past pulmonary                 embolism this hospitalization.  Sonographer:    Merrie Roof RDCS Referring Phys:  [place]  1. Left ventricular ejection fraction, by estimation, is >75%. The left ventricle has hyperdynamic function. The left ventricle has no regional wall motion abnormalities.  2. Right ventricular systolic function is mildly reduced. The right ventricular size is normal. There is moderately elevated pulmonary artery systolic pressure.  3. The mitral valve is normal in structure. No evidence of mitral valve regurgitation. No evidence of mitral stenosis.  4. The aortic valve is tricuspid. Aortic valve regurgitation is not visualized. No aortic stenosis is present.  5. The inferior vena cava is normal in size with greater than 50% respiratory variability, suggesting right atrial pressure of 3 mmHg. FINDINGS  Left Ventricle: Left ventricular ejection fraction, by estimation, is >75%. The left ventricle has hyperdynamic function. The left ventricle has no regional wall motion abnormalities. The left ventricular internal cavity size was normal in size. There is no left ventricular hypertrophy. Right Ventricle: The right ventricular size is normal.Right ventricular systolic function is mildly reduced. There is moderately elevated pulmonary artery systolic pressure. The tricuspid regurgitant velocity is 3.35 m/s, and with an assumed right atrial  pressure of 3 mmHg, the estimated right ventricular systolic pressure is 12.8 mmHg. Left Atrium: Left atrial size was normal in size. Right Atrium: Right atrial size was normal in size. Pericardium: There is no evidence of pericardial effusion. Mitral Valve: The mitral valve is normal in structure. No evidence of mitral valve stenosis. Tricuspid Valve: The tricuspid valve is normal in structure. Tricuspid valve regurgitation is mild . No evidence of tricuspid stenosis. Aortic Valve: The aortic valve is tricuspid. Aortic valve regurgitation is not visualized. No aortic stenosis is present. Pulmonic Valve: The pulmonic valve was normal in  structure.  Pulmonic valve regurgitation is trivial. No evidence of pulmonic stenosis. Aorta: The aortic root is normal in size and structure. Venous: The inferior vena cava is normal in size with greater than 50% respiratory variability, suggesting right atrial pressure of 3 mmHg. IAS/Shunts: The interatrial septum was not well visualized. RIGHT VENTRICLE RV Basal diam:  3.20 cm TRICUSPID VALVE TR Peak grad:   44.9 mmHg TR Vmax:        335.00 cm/s Kirk Ruths MD Electronically signed by Kirk Ruths MD Signature Date/Time: 11/07/2020/2:41:34 PM    Final      Scheduled Meds:  bisacodyl  10 mg Rectal Once   Chlorhexidine Gluconate Cloth  6 each Topical Daily   feeding supplement (GLUCERNA SHAKE)  237 mL Oral TID BM   insulin aspart  0-15 Units Subcutaneous TID WC   insulin aspart  10 Units Subcutaneous TID WC   insulin glargine-yfgn  35 Units Subcutaneous Daily   insulin starter kit- pen needles  1 kit Other Once   living well with diabetes book   Does not apply Once   multivitamin with minerals  1 tablet Oral Daily   polyethylene glycol  17 g Oral BID   senna-docusate  1 tablet Oral BID   Continuous Infusions:  vancomycin HCl 1,750 mg (11/08/20 2111)     LOS: 9 days   Flora Lipps, MD [facility] 11/09/2020, 7:40 AM  If 7PM-7AM, please contact night-coverage [email or web address]

## 2020-11-09 NOTE — Progress Notes (Addendum)
Inpatient Rehabilitation Admissions Coordinator   Inpatient rehab consult received, I met with patient at bedside with her fiance, Konrad Dolores. We discussed goals and expectations of a possible CIR admit prior to d/c home. She and Konrad Dolores would like to discuss their options of HH vs CIR. I will begin Auth with [place] [employer] well care in case they would like to pursue admit in a few days.  Danne Baxter, RN, MSN Rehab Admissions Coordinator [phone] 11/09/2020 2:39 PM

## 2020-11-09 NOTE — Progress Notes (Signed)
Physical Therapy Treatment Patient Details Name: Chelsea Mathis MRN: [id number] DOB: 1990-06-22 Today's Date: 11/09/2020    History of Present Illness Pt is 30 y.o. female admitted with DKA, multiple electrolyte disturbances and severe sepsis secondary to bacteremia on 10/31/20. CTA (+) cavitary lung lesions. 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 8/6 acute PE likely related to infective source rather than blood clot in setting of endocarditis started on IV heparin. CT femur suggestive of myositis on R thigh. PMHx significant for poorly controlled DMII and recent UTI.    PT Comments    Pt with gradual progress but remains limited by pain.  Despite fatigue and pain, pt willing and motivated to work with therapy.  Session focused on posture and exercise to improve breathing, exercises for leg strengthening, and transfers and gait as able.  She was able to take a few steps with RW and min A of 2.  Continue to progress as able.      Follow Up Recommendations  CIR     Equipment Recommendations  Rolling walker with 5" wheels;Wheelchair cushion (measurements PT);Wheelchair (measurements PT);3in1 (PT)    Recommendations for Other Services       Precautions / Restrictions Precautions Precautions: Fall    Mobility  Bed Mobility Overal bed mobility: Needs Assistance Bed Mobility: Supine to Sit;Sit to Supine     Supine to sit: Mod assist Sit to supine: Mod assist;+2 for physical assistance   General bed mobility comments: Supine to sit: increased time but pt working legs to EOB with min A for R leg and then mod A to lift trunk.  Supine to sit: mod x 2 for pain control    Transfers Overall transfer level: Needs assistance Equipment used: Rolling walker (2 wheeled) Transfers: Sit to/from Stand   Stand pivot transfers: Min assist;+2 physical assistance;From elevated surface       General transfer comment: Min A x 2 but from highly elevated bed with increased time to rise  and cues for hand placement  Ambulation/Gait Ambulation/Gait assistance: Min assist;+2 safety/equipment Gait Distance (Feet): 3 Feet Assistive device: Rolling walker (2 wheeled) Gait Pattern/deviations: Decreased stride length;Shuffle Gait velocity: decreased   General Gait Details: Side steps toward HOB with RW and min A of 2 to steady and assist with walker.   Stairs             Wheelchair Mobility    Modified Rankin (Stroke Patients Only)       Balance Overall balance assessment: Needs assistance Sitting-balance support: Feet supported Sitting balance-Leahy Scale: Good     Standing balance support: Bilateral upper extremity supported;During functional activity Standing balance-Leahy Scale: Poor Standing balance comment: Requiring bil UE support                            Cognition Arousal/Alertness: Awake/alert Behavior During Therapy: WFL for tasks assessed/performed Overall Cognitive Status: Within Functional Limits for tasks assessed                                        Exercises General Exercises - Lower Extremity Ankle Circles/Pumps: AROM;Both;Supine;10 reps Quad Sets: AROM;Both;Supine;10 reps Long Arc Quad: AROM;Both;Seated;20 reps Heel Slides: AROM;Left;AAROM;Right;Supine;10 reps Other Exercises Other Exercises: Scapular retraction x 10; scapular retraction with incentive spirometer x 15 drawing in 300-500 mL -cues for breathing technique Other Exercises: trunk rotation in sitting x 10  each direction    General Comments General comments (skin integrity, edema, etc.): On RA with sats >94% throughout      Pertinent Vitals/Pain Pain Assessment: 0-10 Pain Score: 8  Pain Location: Bil Groin (R worse than L) with movement Pain Descriptors / Indicators: Aching;Sore;Sharp Pain Intervention(s): Limited activity within patient's tolerance;Monitored during session;Repositioned;Heat applied;Relaxation    Home Living                       Prior Function            PT Goals (current goals can now be found in the care plan section) Acute Rehab PT Goals Patient Stated Goal: To return home. PT Goal Formulation: With patient Time For Goal Achievement: 11/18/20 Potential to Achieve Goals: Good Progress towards PT goals: Progressing toward goals    Frequency    Min 3X/week      PT Plan Current plan remains appropriate    Co-evaluation              AM-PAC PT "6 Clicks" Mobility   Outcome Measure  Help needed turning from your back to your side while in a flat bed without using bedrails?: A Lot Help needed moving from lying on your back to sitting on the side of a flat bed without using bedrails?: A Lot Help needed moving to and from a bed to a chair (including a wheelchair)?: Total Help needed standing up from a chair using your arms (e.g., wheelchair or bedside chair)?: Total Help needed to walk in hospital room?: Total Help needed climbing 3-5 steps with a railing? : Total 6 Click Score: 8    End of Session Equipment Utilized During Treatment: Gait belt Activity Tolerance: Patient tolerated treatment well Patient left: in bed;with call bell/phone within reach;with family/visitor present Nurse Communication: Mobility status PT Visit Diagnosis: Unsteadiness on feet (R26.81);Muscle weakness (generalized) (M62.81)     Time: 1287-8676 PT Time Calculation (min) (ACUTE ONLY): 29 min  Charges:  $Therapeutic Exercise: 8-22 mins $Therapeutic Activity: 8-22 mins                     Anise Salvo, PT Acute Rehab Services Pager [phone] [facility] [phone]    Rayetta Humphrey 11/09/2020, 4:50 PM

## 2020-11-09 NOTE — Progress Notes (Signed)
Occupational Therapy Treatment Patient Details Name: Chelsea Mathis MRN: [id number] DOB: 1990-04-09 Today's Date: 11/09/2020    History of present illness Pt is 30 y.o. female admitted with DKA, multiple electrolyte disturbances and severe sepsis secondary to bacteremia on 10/31/20. CTA (+) cavitary lung lesions. 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 8/6 acute PE likely related to infective source rather than blood clot in setting of endocarditis started on IV heparin. CT femur suggestive of myositis on R thigh. PMHx significant for poorly controlled DMII and recent UTI.   OT comments  OT treatment session with focus on therapeutic activity, self-care re-education and instruction on BUE HEP. Mod A +2 for sit to stand in stedy in prep for grooming standing at sink level. Patient completed 2/3 grooming tasks in perched position with supervision A. Patient reports sitting in recliner earlier this date. Only able to tolerate recliner for 30 minute increments spending most days in bed. Patient endorses progressive BUE weakness. BUE HEP provided for completion 2x daily. OT will continue to follow acutely.    Follow Up Recommendations  CIR    Equipment Recommendations  Other (comment) (Defer to next level of care)    Recommendations for Other Services Rehab consult    Precautions / Restrictions Precautions Precautions: Fall Restrictions Weight Bearing Restrictions: No       Mobility Bed Mobility Overal bed mobility: Needs Assistance Bed Mobility: Supine to Sit;Sit to Supine     Supine to sit: Mod assist;+2 for physical assistance;+2 for safety/equipment Sit to supine: Mod assist   General bed mobility comments: Mod A +2 for supine to EOB with HOB elevated and assist to advance RLE from bed surface to EOB. With return to supine, patient able to control descent of trunk and advance LLE from EOB to bed surface with heavy assist at RLE. Increased time/effort for all parts of bed  mobility.    Transfers Overall transfer level: Needs assistance Equipment used: Ambulation equipment used Transfers: Sit to/from Stand Sit to Stand: Mod assist;+2 physical assistance;+2 safety/equipment         General transfer comment: Mod A +2 for sit to stand from elevated EOB x1 and from perched position in stedy x3. Requires increased time/effort. Greatly limited by pain.    Balance Overall balance assessment: Needs assistance Sitting-balance support: Feet supported Sitting balance-Leahy Scale: Good     Standing balance support: Bilateral upper extremity supported;During functional activity Standing balance-Leahy Scale: Poor Standing balance comment: Reliant on BUE and +2 external assist.                           ADL either performed or assessed with clinical judgement   ADL Overall ADL's : Needs assistance/impaired     Grooming: Set up;Sitting Grooming Details (indicate cue type and reason): 2/3 grooming tasks in perched position on stedy at sink level.             Lower Body Dressing: Bed level;Moderate assistance;+2 for physical assistance;+2 for safety/equipment   Toilet Transfer: Total assistance Toilet Transfer Details (indicate cue type and reason): [employer]                 Vision       Perception     Praxis      Cognition Arousal/Alertness: Awake/alert Behavior During Therapy: WFL for tasks assessed/performed Overall Cognitive Status: Within Functional Limits for tasks assessed  Exercises     Shoulder Instructions       General Comments Patient reports pain has not improved since evaluation. Patient tearful throughout session expressing concern for her overall health. Patient would like to speak to mental health professional. RN made aware.    Pertinent Vitals/ Pain       Pain Assessment: Faces Faces Pain Scale: Hurts even more Pain Location: Bil Groin (R worse  than L) with movement Pain Descriptors / Indicators: Aching;Sore;Sharp Pain Intervention(s): Limited activity within patient's tolerance;Monitored during session;Premedicated before session;Repositioned  Home Living                                          Prior Functioning/Environment              Frequency  Min 2X/week        Progress Toward Goals  OT Goals(current goals can now be found in the care plan section)  Progress towards OT goals: Progressing toward goals (Slowly progressing; greatly limited by pain)  Acute Rehab OT Goals Patient Stated Goal: To return home. OT Goal Formulation: With patient Time For Goal Achievement: 11/18/20 Potential to Achieve Goals: Good ADL Goals Pt Will Perform Grooming: with modified independence;standing Pt Will Perform Upper Body Dressing: Independently;sitting Pt Will Perform Lower Body Dressing: with modified independence;sitting/lateral leans Pt Will Transfer to Toilet: with modified independence;ambulating Pt Will Perform Toileting - Clothing Manipulation and hygiene: with modified independence;sit to/from stand Pt Will Perform Tub/Shower Transfer: Tub transfer;3 in 1;rolling walker Additional ADL Goal #1: Patient will tolerate 15 minutes of therapeutic activity with self-reported pain rating <3/10 in prep for ADLs/IADLs.  Plan Discharge plan remains appropriate;Frequency remains appropriate    Co-evaluation                 AM-PAC OT "6 Clicks" Daily Activity     Outcome Measure   Help from another person eating meals?: None Help from another person taking care of personal grooming?: A Little Help from another person toileting, which includes using toliet, bedpan, or urinal?: A Lot Help from another person bathing (including washing, rinsing, drying)?: A Lot Help from another person to put on and taking off regular upper body clothing?: A Little Help from another person to put on and taking off  regular lower body clothing?: A Lot 6 Click Score: 16    End of Session Equipment Utilized During Treatment: Gait belt;Oxygen;Other (comment) Antony Salmon)  OT Visit Diagnosis: Unsteadiness on feet (R26.81);Muscle weakness (generalized) (M62.81);Pain Pain - Right/Left: Right Pain - part of body: Leg;Hip (Groin)   Activity Tolerance Patient tolerated treatment well;Patient limited by pain   Patient Left in bed;with call bell/phone within reach;with bed alarm set   Nurse Communication Mobility status;Other (comment);Need for lift equipment (Stedy +2; patient with desire to speak to a mental health professional.)        Time: 1135-1204 OT Time Calculation (min): 29 min  Charges: OT General Charges $OT Visit: 1 Visit OT Treatments $Self Care/Home Management : 8-22 mins $Therapeutic Activity: 8-22 mins  Lounette Sloan H. OTR/L Supplemental OT, Department of rehab services [phone]   Maronda Caison R H. 11/09/2020, 1:13 PM

## 2020-11-09 NOTE — Plan of Care (Signed)
  Problem: Education: Goal: Ability to describe self-care measures that may prevent or decrease complications (Diabetes Survival Skills Education) will improve Outcome: Progressing Goal: Individualized Educational Video(s) Outcome: Progressing   Problem: Metabolic: Goal: Ability to maintain appropriate glucose levels will improve Outcome: Progressing   Problem: Education: Goal: Knowledge of General Education information will improve Description: Including pain rating scale, medication(s)/side effects and non-pharmacologic comfort measures Outcome: Progressing   Problem: Health Behavior/Discharge Planning: Goal: Ability to manage health-related needs will improve Outcome: Progressing   Problem: Clinical Measurements: Goal: Ability to maintain clinical measurements within normal limits will improve Outcome: Progressing Goal: Will remain free from infection Outcome: Progressing Goal: Diagnostic test results will improve Outcome: Progressing Goal: Respiratory complications will improve Outcome: Progressing Goal: Cardiovascular complication will be avoided Outcome: Progressing   Problem: Activity: Goal: Risk for activity intolerance will decrease Outcome: Progressing   Problem: Nutrition: Goal: Adequate nutrition will be maintained Outcome: Progressing   Problem: Coping: Goal: Level of anxiety will decrease Outcome: Progressing   Problem: Elimination: Goal: Will not experience complications related to bowel motility Outcome: Progressing Goal: Will not experience complications related to urinary retention Outcome: Progressing   Problem: Pain Managment: Goal: General experience of comfort will improve Outcome: Progressing   Problem: Safety: Goal: Ability to remain free from injury will improve Outcome: Progressing   Problem: Skin Integrity: Goal: Risk for impaired skin integrity will decrease Outcome: Progressing   

## 2020-11-09 NOTE — Progress Notes (Signed)
[facility] Infectious Diseases Follow Up Note  Patient Identification: Patient Name: Chelsea Mathis MRN: [id number] Admit Date: 10/31/2020  9:34 AM Age: 30 y.o.Today's Date: 11/09/2020   Reason for Visit: MRSA bacteremia  Principal Problem:   Severe sepsis (HCC) Active Problems:   DKA (diabetic ketoacidosis) (HCC)   Bacteremia due to Staphylococcus aureus   Septic pulmonary embolism (HCC)   Type 2 diabetes mellitus with hypoglycemia without coma (HCC)   Myositis   Myofasciitis  Antibiotics:  Vancomycin-c                    clindamycin/zosyn 8/2    Lines/Tubes: PIVs    Interval Events: Afebrile for more than 48 hours, WBC is also downtrending.  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  2.   Duration would be 6 weeks from date of negative blood cultures on 8/8 if they continue to remain negative.  Currently negative at day 2.  3.   PICC line to be placed if repeat blood cultures on 8/8 are no growth for at least 72 hrs 4.   Monitor CBC, BMP and Vancomycin trough  5.   Monitor for worsening pain swelling in her bilateral thighs/need to reimage.  Currently seems to be improving 6.   ID pharmacy will place OPAT orders 7.   A follow-up with our [facility] will be made 8.   Call us back if blood cultures from 8/8 are positive  for MRSA. Otherwise I will sign off for now.   Discussed with patient/ID pharmacy and primary  Rest of the management as per the primary team. Thank you for the consult. Please page with pertinent questions or concerns.  ______________________________________________________________________ Subjective patient seen and examined at the bedside.  Sitting up in the bed.  She has looked the best to me since being admitted this hospitalization.  Pain in her bilateral thighs is improving.  Denies any back pain, denies any peripheral joint or swelling.  Denies any chest pain.  Denies any fever, chills and sweats  Vitals BP 125/67 (BP Location: Right Arm)   Pulse (!) 108   Temp 97.9 F (36.6 C) (Oral)   Resp 19   Ht 5\' 4"  (1.626 m)   Wt 103.5 kg   LMP 10/31/2020   SpO2 98%   BMI 39.17 kg/m     Physical Exam Constitutional: not in acute distress, on nasal cannula  Cardiovascular:     Rate and Rhythm: Normal rate and regular rhythm.     Heart sounds:   Pulmonary:     Effort: on nasal cannula     Comments:   Abdominal:     Palpations: Abdomen is soft.     Tenderness: Nontender and nondistended  Musculoskeletal:        General: Tenderness in the right thigh/left thigh, improved  Skin              Comments: No lesions or rashes   Neurological:     General: No focal deficit present.   Psychiatric:        Mood and Affect: Mood normal.    Pertinent Microbiology Results for orders placed or performed during the hospital encounter of 10/31/20  Resp Panel by RT-PCR (Flu A&B, Covid) Nasopharyngeal Swab     Status: None   Collection Time: 10/31/20 10:53 AM   Specimen: Nasopharyngeal Swab; Nasopharyngeal(NP) swabs in vial transport medium  Result Value Ref Range Status   SARS Coronavirus 2 by RT PCR NEGATIVE NEGATIVE Final    Comment: (NOTE) SARS-CoV-2 target nucleic acids are NOT DETECTED.  The SARS-CoV-2 RNA is generally detectable in upper  respiratory specimens during the acute phase of infection. The lowest concentration of SARS-CoV-2 viral copies this assay can detect is 138 copies/mL. A negative result does not preclude SARS-Cov-2 infection and should not be used as the sole basis for treatment or other patient management decisions. A negative result may occur with  improper specimen collection/handling, submission of specimen other than nasopharyngeal swab, presence of viral mutation(s) within the areas targeted by this assay, and inadequate number of viral copies(<138 copies/mL). A negative result must be combined with clinical observations, patient history, and epidemiological information. The expected result is Negative.  Fact Sheet for Patients:  BloggerCourse.com  Fact Sheet for Healthcare Providers:  SeriousBroker.it  This test is no t yet approved or cleared by the [place] FDA and  has been authorized for detection and/or diagnosis of SARS-CoV-2 by FDA under an Emergency Use Authorization (EUA). This EUA will remain  in effect (meaning this test can be used) for the duration of the COVID-19 declaration under Section 564(b)(1) of the Act, 21 U.S.C.section 360bbb-3(b)(1), unless the authorization is terminated  or revoked sooner.       Influenza A by PCR NEGATIVE NEGATIVE Final   Influenza B by PCR NEGATIVE NEGATIVE Final    Comment: (NOTE) The Xpert Xpress SARS-CoV-2/FLU/RSV plus assay is intended as an aid in the diagnosis of influenza from Nasopharyngeal swab specimens and should not be used as a sole basis for treatment. Nasal washings and aspirates are unacceptable for Xpert Xpress SARS-CoV-2/FLU/RSV testing.  Fact Sheet for Patients: BloggerCourse.com  Fact Sheet for Healthcare Providers: SeriousBroker.it  This test is not yet approved or cleared by the [place] FDA and has been  authorized for detection and/or diagnosis of SARS-CoV-2 by FDA under an Emergency Use Authorization (EUA). This EUA will remain in effect (meaning this test can be used) for the duration of the COVID-19 declaration under Section 564(b)(1) of the Act, 21 U.S.C. section 360bbb-3(b)(1), unless the authorization is terminated or revoked.  Performed at [facility], [address], [place], [place] [address]   Blood culture (routine single)     Status: Abnormal   Collection Time: 10/31/20 11:00 AM   Specimen: BLOOD LEFT FOREARM  Result Value Ref Range Status   Specimen Description BLOOD LEFT FOREARM  Final   Special Requests   Final    BOTTLES DRAWN AEROBIC AND ANAEROBIC Blood Culture adequate volume   Culture  Setup Time   Final    GRAM POSITIVE COCCI IN CLUSTERS IN BOTH AEROBIC AND ANAEROBIC BOTTLES Organism ID to follow CRITICAL RESULT CALLED TO, READ BACK BY AND VERIFIED WITH: JCyndia Bent PHARMD, AT 0221 11/01/20 Renato Shin Performed at [facility], [address], [place], [place] [address]    Culture METHICILLIN RESISTANT STAPHYLOCOCCUS AUREUS (A)  Final   Report Status 11/03/2020 FINAL  Final   Organism ID, Bacteria METHICILLIN RESISTANT STAPHYLOCOCCUS AUREUS  Final      Susceptibility   Methicillin resistant staphylococcus aureus - MIC*    CIPROFLOXACIN >=8 RESISTANT Resistant     ERYTHROMYCIN >=8 RESISTANT Resistant     GENTAMICIN <=0.5 SENSITIVE Sensitive     OXACILLIN >=4 RESISTANT Resistant     TETRACYCLINE <=1 SENSITIVE Sensitive     VANCOMYCIN <=0.5 SENSITIVE Sensitive     TRIMETH/SULFA <=10 SENSITIVE Sensitive     CLINDAMYCIN <=0.25 SENSITIVE Sensitive     RIFAMPIN <=0.5 SENSITIVE Sensitive     Inducible Clindamycin NEGATIVE Sensitive     * METHICILLIN RESISTANT STAPHYLOCOCCUS AUREUS  Blood Culture ID Panel (Reflexed)     Status: Abnormal   Collection Time: 10/31/20 11:00 AM  Result Value Ref Range Status   Enterococcus faecalis NOT DETECTED NOT DETECTED  Final   Enterococcus Faecium NOT DETECTED NOT DETECTED Final   Listeria monocytogenes NOT DETECTED NOT DETECTED Final   Staphylococcus species DETECTED (A) NOT DETECTED Final    Comment: CRITICAL RESULT CALLED TO, READ BACK BY AND VERIFIED WITH: J. LEDFORD PHARMD, AT 0221 11/01/20 D. VANHOOK    Staphylococcus aureus (BCID) DETECTED (A) NOT DETECTED Final    Comment: Methicillin (oxacillin)-resistant Staphylococcus aureus (MRSA). MRSA is predictably resistant to beta-lactam antibiotics (except ceftaroline). Preferred therapy is vancomycin unless clinically contraindicated. Patient requires contact precautions if  hospitalized. CRITICAL RESULT CALLED TO, READ BACK BY AND VERIFIED WITH: J. LEDFORD PHARMD, AT 0221 11/01/20 D. VANHOOK    Staphylococcus epidermidis NOT DETECTED NOT DETECTED Final   Staphylococcus lugdunensis NOT DETECTED NOT DETECTED Final   Streptococcus species NOT DETECTED NOT DETECTED Final   Streptococcus agalactiae NOT DETECTED NOT DETECTED Final   Streptococcus pneumoniae NOT DETECTED NOT DETECTED Final   Streptococcus pyogenes NOT DETECTED NOT DETECTED Final   A.calcoaceticus-baumannii NOT DETECTED NOT DETECTED Final   Bacteroides fragilis NOT DETECTED NOT DETECTED Final   Enterobacterales NOT DETECTED NOT DETECTED Final   Enterobacter cloacae complex NOT DETECTED NOT DETECTED Final   Escherichia coli NOT DETECTED NOT DETECTED Final   Klebsiella aerogenes NOT DETECTED NOT DETECTED Final   Klebsiella oxytoca NOT DETECTED NOT DETECTED Final   Klebsiella pneumoniae NOT DETECTED NOT DETECTED Final   Proteus species NOT DETECTED NOT DETECTED Final   Salmonella species NOT DETECTED NOT DETECTED Final   Serratia marcescens NOT DETECTED NOT DETECTED Final   Haemophilus influenzae NOT DETECTED NOT DETECTED Final   Neisseria meningitidis NOT DETECTED NOT DETECTED Final   Pseudomonas aeruginosa NOT DETECTED NOT DETECTED Final   Stenotrophomonas maltophilia NOT DETECTED NOT  DETECTED Final   Candida albicans NOT DETECTED NOT DETECTED Final   Candida auris NOT DETECTED NOT DETECTED Final   Candida glabrata NOT DETECTED NOT DETECTED Final   Candida krusei NOT DETECTED NOT DETECTED Final   Candida parapsilosis NOT DETECTED NOT DETECTED Final   Candida tropicalis NOT DETECTED NOT DETECTED Final   Cryptococcus neoformans/gattii NOT DETECTED NOT DETECTED Final   Meth resistant mecA/C and MREJ DETECTED (A) NOT DETECTED Final    Comment: CRITICAL RESULT CALLED TO, READ BACK BY AND VERIFIED WITH: J. LEDFORD PHARMD, AT 0221 11/01/20 Renato Shin Performed at [facility], [address] [place], [place] [address]   Urine Culture     Status: Abnormal   Collection Time: 10/31/20  1:29 PM  Specimen: In/Out Cath Urine  Result Value Ref Range Status   Specimen Description IN/OUT CATH URINE  Final   Special Requests   Final    NONE Performed at [facility], [address], [place], [place] [address]    Culture MULTIPLE SPECIES PRESENT, SUGGEST RECOLLECTION (A)  Final   Report Status 11/01/2020 FINAL  Final  MRSA Next Gen by PCR, Nasal     Status: Abnormal   Collection Time: 10/31/20  7:50 PM   Specimen: Nasal Mucosa; Nasal Swab  Result Value Ref Range Status   MRSA by PCR Next Gen DETECTED (A) NOT DETECTED Final    Comment: RESULT CALLED TO, READ BACK BY AND VERIFIED WITH: WARNER,B RN AT 2144 10/31/2020 MITCHELL,L (NOTE) The GeneXpert MRSA Assay (FDA approved for NASAL specimens only), is one component of a comprehensive MRSA colonization surveillance program. It is not intended to diagnose MRSA infection nor to guide or monitor treatment for MRSA infections. Test performance is not FDA approved in patients less than 49 years old. Performed at [facility], [address], [place], [place] [address]   Culture, blood (routine x 2)     Status: None   Collection Time: 11/01/20 10:22 AM   Specimen: BLOOD RIGHT FOREARM  Result Value Ref Range Status    Specimen Description BLOOD RIGHT FOREARM  Final   Special Requests   Final    BOTTLES DRAWN AEROBIC AND ANAEROBIC Blood Culture adequate volume   Culture   Final    NO GROWTH 5 DAYS Performed at [facility], [address], [place], [place] [address]    Report Status 11/06/2020 FINAL  Final  Culture, blood (routine x 2)     Status: None   Collection Time: 11/01/20 10:29 AM   Specimen: BLOOD RIGHT HAND  Result Value Ref Range Status   Specimen Description BLOOD RIGHT HAND  Final   Special Requests   Final    BOTTLES DRAWN AEROBIC AND ANAEROBIC Blood Culture adequate volume   Culture   Final    NO GROWTH 5 DAYS Performed at [facility], [address], [place], [place] [address]    Report Status 11/06/2020 FINAL  Final  Culture, blood (routine x 2)     Status: Abnormal   Collection Time: 11/04/20  5:50 AM   Specimen: BLOOD RIGHT HAND  Result Value Ref Range Status   Specimen Description BLOOD RIGHT HAND  Final   Special Requests   Final    BOTTLES DRAWN AEROBIC ONLY Blood Culture results may not be optimal due to an inadequate volume of blood received in culture bottles   Culture  Setup Time   Final    GRAM POSITIVE COCCI IN CLUSTERS AEROBIC BOTTLE ONLY CRITICAL RESULT CALLED TO, READ BACK BY AND VERIFIED WITH: L. SEAY,PHARMD 0211 11/05/2020 T. TYSOR    Culture (A)  Final    STAPHYLOCOCCUS AUREUS SUSCEPTIBILITIES PERFORMED ON PREVIOUS CULTURE WITHIN THE LAST 5 DAYS. Performed at [facility], [address] [place], [place] [address]    Report Status 11/07/2020 FINAL  Final  Culture, blood (routine x 2)     Status: None (Preliminary result)   Collection Time: 11/04/20  6:00 AM   Specimen: BLOOD RIGHT HAND  Result Value Ref Range Status   Specimen Description BLOOD RIGHT HAND  Final   Special Requests   Final    BOTTLES DRAWN AEROBIC ONLY Blood Culture adequate volume   Culture  Final    NO GROWTH 4 DAYS Performed at [facility], [address], [place], [place] [address]    Report Status PENDING  Incomplete  Culture, blood (routine x 2)     Status: Abnormal   Collection Time: 11/05/20  8:17 AM   Specimen: BLOOD  Result Value Ref Range Status   Specimen Description BLOOD RIGHT ANTECUBITAL  Final   Special Requests   Final    BOTTLES DRAWN AEROBIC AND ANAEROBIC Blood Culture results may not be optimal due to an inadequate volume of blood received in culture bottles   Culture  Setup Time   Final    GRAM POSITIVE COCCI IN CLUSTERS ANAEROBIC BOTTLE ONLY CRITICAL VALUE NOTED.  VALUE IS CONSISTENT WITH PREVIOUSLY REPORTED AND CALLED VALUE. Performed at [facility], [address] [place], [place] [address]    Culture METHICILLIN RESISTANT STAPHYLOCOCCUS AUREUS (A)  Final   Report Status 11/08/2020 FINAL  Final   Organism ID, Bacteria METHICILLIN RESISTANT STAPHYLOCOCCUS AUREUS  Final      Susceptibility   Methicillin resistant staphylococcus aureus - MIC*    CIPROFLOXACIN >=8 RESISTANT Resistant     ERYTHROMYCIN >=8 RESISTANT Resistant     GENTAMICIN <=0.5 SENSITIVE Sensitive     OXACILLIN >=4 RESISTANT Resistant     TETRACYCLINE <=1 SENSITIVE Sensitive     VANCOMYCIN 1 SENSITIVE Sensitive     TRIMETH/SULFA <=10 SENSITIVE Sensitive     CLINDAMYCIN <=0.25 SENSITIVE Sensitive     RIFAMPIN <=0.5 SENSITIVE Sensitive     Inducible Clindamycin NEGATIVE Sensitive     * METHICILLIN RESISTANT STAPHYLOCOCCUS AUREUS  Culture, blood (routine x 2)     Status: None (Preliminary result)   Collection Time: 11/05/20 10:06 AM   Specimen: BLOOD RIGHT HAND  Result Value Ref Range Status   Specimen Description BLOOD RIGHT HAND  Final   Special Requests   Final    BOTTLES DRAWN AEROBIC AND ANAEROBIC Blood Culture adequate volume   Culture   Final    NO GROWTH 3 DAYS Performed at [facility], [address] [place], [place] [address]    Report Status PENDING  Incomplete  Culture, blood (routine x 2)     Status: None  (Preliminary result)   Collection Time: 11/07/20 11:36 AM   Specimen: BLOOD  Result Value Ref Range Status   Specimen Description BLOOD RIGHT ANTECUBITAL  Final   Special Requests   Final    BOTTLES DRAWN AEROBIC ONLY Blood Culture adequate volume   Culture   Final    NO GROWTH < 24 HOURS Performed at [facility], [address], [place], [place] [address]    Report Status PENDING  Incomplete  Culture, blood (routine x 2)     Status: None (Preliminary result)   Collection Time: 11/07/20 11:36 AM   Specimen: BLOOD RIGHT HAND  Result Value Ref Range Status   Specimen Description BLOOD RIGHT HAND  Final   Special Requests   Final    BOTTLES DRAWN AEROBIC ONLY Blood Culture adequate volume   Culture   Final    NO GROWTH < 24 HOURS Performed at [facility], [address], [place], [place] [address]    Report Status PENDING  Incomplete    Pertinent Lab. CBC Latest Ref Rng & Units 11/09/2020 11/08/2020 11/08/2020  WBC 4.0 - 10.5 K/uL 23.8(H) - 28.4(H)  Hemoglobin 12.0 - 15.0 g/dL 7.5(L) 6.8(LL) 6.6(LL)  Hematocrit 36.0 -  46.0 % 23.2(L) 20.5(L) 20.6(L)  Platelets 150 - 400 K/uL 467(H) - 428(H)   CMP Latest Ref Rng & Units 11/09/2020 11/08/2020 11/06/2020  Glucose 70 - 99 mg/dL 226(J) 335(K) 562(B)  BUN 6 - 20 mg/dL <6(L) <8(L) <3(T)  Creatinine 0.44 - 1.00 mg/dL 3.42(A) 7.68(T) 1.57(W)  Sodium 135 - 145 mmol/L 131(L) 131(L) 130(L)  Potassium 3.5 - 5.1 mmol/L 3.9 4.0 4.2  Chloride 98 - 111 mmol/L 95(L) 97(L) 94(L)  CO2 22 - 32 mmol/L 28 31 30   Calcium 8.9 - 10.3 mg/dL 7.7(L) 7.6(L) 7.3(L)  Total Protein 6.5 - 8.1 g/dL 6.4(L) 6.4(L) -  Total Bilirubin 0.3 - 1.2 mg/dL 0.8 1.0 -  Alkaline Phos 38 - 126 U/L 85 86 -  AST 15 - 41 U/L 21 19 -  ALT 0 - 44 U/L 10 11 -     Pertinent Imaging today Plain films and CT images have been personally visualized and interpreted; radiology reports have been reviewed. Decision making incorporated into the Impression /  Recommendations.  TEE prelim 11/03/20 FINDINGS:   LEFT VENTRICLE: EF is hyperdynamic. No regional wall motion abnormalities.  RIGHT VENTRICLE: Normal size and function.   LEFT ATRIUM: No thrombus/mass.   LEFT ATRIAL APPENDAGE: No thrombus/mass.   RIGHT ATRIUM: No thrombus/mass.   AORTIC VALVE:  Trileaflet. No regurgitation. No vegetation.   MITRAL VALVE:    Normal structure. No significant regurgitation. No vegetation.   TRICUSPID VALVE: Abnormal valve suggestive of endocarditis. There is no discrete mass seen attached to the valve, but the valve is overall thickened, and there are thickened/echogenic areas of the subvalvular apparatus. Trivial TR.   PULMONIC VALVE: Grossly normal structure. Trivial regurgitation. No apparent vegetation.   INTERATRIAL SEPTUM: No PFO or ASD seen by color Doppler.   PERICARDIUM: Trivialeffusion noted.   DESCENDING AORTA: No plaque seen     CONCLUSION: Suggestive of tricuspid valve endocarditis. While no discrete echodensity is seen on the atrial surface of the valve, the overall thickening and appearance of the valve and subvalvular apparatus supports infection. Cannot exclude that tricuspid valve had vegetation at one time resulting in septic emboli.   I spent more than 35 minutes for this patient encounter including review of prior medical records, coordination of care  with greater than 50% of time being face to face/counseling and discussing diagnostics/treatment plan with the patient/family.  Electronically signed by:   01/03/21, MD Infectious Disease Physician [facility] for Infectious Disease Pager: [phone]

## 2020-11-09 NOTE — Progress Notes (Signed)
Inpatient Rehab Admissions Coordinator Note:   Per therapy recommendations, pt was screened for CIR candidacy by Estill Dooms, PT, DPT.  At this time we are recommending a CIR consult and I will place an order per our protocol.  Please contact me with questions.   Estill Dooms, PT, DPT [phone] 11/09/20 8:38 AM

## 2020-11-10 DIAGNOSIS — F4323 Adjustment disorder with mixed anxiety and depressed mood: Secondary | ICD-10-CM

## 2020-11-10 LAB — MAGNESIUM: Magnesium: 2 mg/dL (ref 1.7–2.4)

## 2020-11-10 LAB — COMPREHENSIVE METABOLIC PANEL
ALT: 11 U/L (ref 0–44)
AST: 23 U/L (ref 15–41)
Albumin: 1.4 g/dL — ABNORMAL LOW (ref 3.5–5.0)
Alkaline Phosphatase: 85 U/L (ref 38–126)
Anion gap: 6 (ref 5–15)
BUN: <5 mg/dL — ABNORMAL LOW (ref 6–20)
CO2: 28 mmol/L (ref 22–32)
Calcium: 7.7 mg/dL — ABNORMAL LOW (ref 8.9–10.3)
Chloride: 98 mmol/L (ref 98–111)
Creatinine, Ser: 0.39 mg/dL — ABNORMAL LOW (ref 0.44–1.00)
GFR, Estimated: >60 mL/min (ref >60)
Glucose, Bld: 127 mg/dL — ABNORMAL HIGH (ref 70–99)
Potassium: 3.8 mmol/L (ref 3.5–5.1)
Sodium: 132 mmol/L — ABNORMAL LOW (ref 135–145)
Total Bilirubin: 0.5 mg/dL (ref 0.3–1.2)
Total Protein: 6.6 g/dL (ref 6.5–8.1)

## 2020-11-10 LAB — CULTURE, BLOOD (ROUTINE X 2)
Culture: NO GROWTH
Special Requests: ADEQUATE

## 2020-11-10 LAB — VANCOMYCIN, PEAK: Vancomycin Pk: 32 ug/mL (ref 30–40)

## 2020-11-10 LAB — GLUCOSE, CAPILLARY
Glucose-Capillary: 92 mg/dL (ref 70–99)
Glucose-Capillary: 154 mg/dL — ABNORMAL HIGH (ref 70–99)
Glucose-Capillary: 57 mg/dL — ABNORMAL LOW (ref 70–99)
Glucose-Capillary: 74 mg/dL (ref 70–99)
Glucose-Capillary: 195 mg/dL — ABNORMAL HIGH (ref 70–99)

## 2020-11-10 LAB — CBC
HCT: 22.8 % — ABNORMAL LOW (ref 36.0–46.0)
Hemoglobin: 7.5 g/dL — ABNORMAL LOW (ref 12.0–15.0)
MCH: 27.4 pg (ref 26.0–34.0)
MCHC: 32.9 g/dL (ref 30.0–36.0)
MCV: 83.2 fL (ref 80.0–100.0)
Platelets: 467 10*3/uL — ABNORMAL HIGH (ref 150–400)
RBC: 2.74 MIL/uL — ABNORMAL LOW (ref 3.87–5.11)
RDW: 17.3 % — ABNORMAL HIGH (ref 11.5–15.5)
WBC: 19.4 10*3/uL — ABNORMAL HIGH (ref 4.0–10.5)
nRBC: 0.3 % — ABNORMAL HIGH (ref 0.0–0.2)

## 2020-11-10 LAB — VANCOMYCIN, TROUGH: Vancomycin Tr: 11 ug/mL — ABNORMAL LOW (ref 15–20)

## 2020-11-10 MED ORDER — INSULIN GLARGINE-YFGN 100 UNIT/ML ~~LOC~~ SOLN
30.0000 [IU] | Freq: Every day | SUBCUTANEOUS | Status: DC
  Filled 2020-11-10: qty 0.3

## 2020-11-10 MED ORDER — HYDROXYZINE HCL 25 MG PO TABS
50.0000 mg | ORAL_TABLET | Freq: Every evening | ORAL | Status: DC | PRN
  Administered 2020-11-10: 50 mg via ORAL
  Filled 2020-11-10: qty 2

## 2020-11-10 MED ORDER — INSULIN ASPART 100 UNIT/ML IJ SOLN: 7.0000 [IU] | Freq: Three times a day (TID) | INTRAMUSCULAR | Status: DC

## 2020-11-10 MED ORDER — HYDROXYZINE HCL 25 MG PO TABS
25.0000 mg | ORAL_TABLET | Freq: Two times a day (BID) | ORAL | Status: DC | PRN
  Administered 2020-11-11 (×2): 25 mg via ORAL
  Filled 2020-11-10 (×2): qty 1

## 2020-11-10 MED ORDER — ENSURE ENLIVE PO LIQD
237.0000 mL | Freq: Two times a day (BID) | ORAL | Status: DC
  Administered 2020-11-10 – 2020-11-11 (×4): 237 mL via ORAL

## 2020-11-10 NOTE — Progress Notes (Signed)
Diagnosis: MRSA TV endocarditis   Culture Result: MRSA  No Known Allergies  OPAT Orders Discharge antibiotics to be given via PICC line Discharge antibiotics: vancomycin  Per pharmacy protocol Aim for Vancomycin trough 15-20 or AUC 400-550 (unless otherwise indicated) Duration: 6 weeks   End Date: 12/19/20  [facility] Care Per Protocol:  Home health RN for IV administration and teaching; PICC line care and labs.    Labs weekly while on IV antibiotics: _X_ CBC with differential _X_ BMP __ CMP __ CRP __ ESR _X_ Vancomycin trough __ CK  _X_ Please pull PIC at completion of IV antibiotics __ Please leave PIC in place until doctor has seen patient or been notified  Fax weekly labs to [phone]  Clinic Follow Up Appt: 11/11/20  '@3' :15 pm  Rosiland Oz, MD Infectious Disease Physician [facility] for Infectious Disease [address] [place],  [address] Phone: [phone]  Fax: [phone]

## 2020-11-10 NOTE — Progress Notes (Signed)
Nutrition Follow-up  DOCUMENTATION CODES:   Obesity unspecified  INTERVENTION:  -Recommend liberalizing diet to improve intake -d/c Glucerna -Ensure Enlive po BID, each supplement provides 350 kcal and 20 grams of protein -Continue MVI with minerals daily  NUTRITION DIAGNOSIS:   Inadequate oral intake related to decreased appetite as evidenced by meal completion < 50%.  ongoing  GOAL:   Patient will meet greater than or equal to 90% of their needs  progressing  MONITOR:   PO intake, Supplement acceptance, Labs, Weight trends, I & O's  REASON FOR ASSESSMENT:   Consult Diet education  ASSESSMENT:   Pt with PMH significant for type 2 DM presented to the hospital with weakness, fatigue, and features of sepsis.  Pt was noted to have staphylococcus bacteremia and was started on IV antibiotic.  Also noted to have septic pulmonary emboli and findings were suggestive of MRSA endocarditis. ID following.  Pt unavailable at time of RD visit. Limited meal documentation since last RD visit; however, RN reports pt has continued to have poor po intake, around 25% for most meals. Pt does fairly well with current orders for Glucerna TID; however, having difficulty obtaining supplement consistently so will transition pt to Ensure instead. Ensure also more appropriate given continued poor meal intake as Ensure provides more calories and protein than Glucerna. Also recommend liberalizing diet to improve po intake.   Medications: dulcolax, SSI, novolog, semglee, mvi with minerals, miralax, senokot-s, IV vancomycin Labs: Na 132 (L) CBGs [phone]   UOP: 1.9L x24 hours I/O: -5.8L since admit  Diet Order:   Diet Order             Diet heart healthy/carb modified Room service appropriate? Yes; Fluid consistency: Thin  Diet effective now                   EDUCATION NEEDS:   Education needs have been addressed  Skin:  Skin Assessment: Reviewed RN Assessment  Last BM:  8/09 type  1  Height:   Ht Readings from Last 1 Encounters:  11/03/20 5\' 4"  (1.626 m)    Weight:   Wt Readings from Last 1 Encounters:  11/09/20 103.5 kg    Ideal Body Weight:  54.55 kg  BMI:  Body mass index is 39.17 kg/m.  Estimated Nutritional Needs:   Kcal:  1700-1900  Protein:  100-110 grams  Fluid:  >1.7L    01/09/21, MS, RD, LDN (she/her/hers) RD pager number and weekend/on-call pager number located in Amion.

## 2020-11-10 NOTE — Progress Notes (Signed)
PROGRESS NOTE   Chelsea Mathis  MZT:[id number] DOB: [birth year] DOA: 10/31/2020 PCP: Pcp, No   Brief Narrative:  Chelsea Mathis is a 30 y.o. female with past medical history of diabetes mellitus type 2 who presented to the hospital with weakness fatigue and features of sepsis.  Patient was noted to have a staff aureus bacteremia and was started on IV antibiotic.  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  Infectious disease was consulted.  Patient was continued on IV antibiotic.    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  Patient ambulatory cultures significant for staph virus infection.  Currently on vancomycin IV.  ID has seen the patient.  Recent cultures from 11/07/2020 negative so far in 3 days.  Leukocytosis trended down.  CBC Latest Ref Rng & Units 11/10/2020 11/09/2020 11/08/2020  WBC 4.0 - 10.5 K/uL 19.4(H) 23.8(H) -  Hemoglobin 12.0 - 15.0 g/dL 7.5(L) 7.5(L) 6.8(LL)  Hematocrit 36.0 - 46.0 % 22.8(L) 23.2(L) 20.5(L)  Platelets 150 - 400 K/uL 467(H) 467(H) -     Persistent staphylococcus bacteremia with cavitary lung lesions Infectious disease was consulted.  On vancomycin IV.   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 Repeat blood culture from 8/5 and 8/6 were positive for staphylococcus aureus.  Repeat blood cultures from 11/17/2020 negative so far in 3 days.  ID recommends vancomycin for 6 weeks from the date of negative. Follow up with blood cultures.  Will need to follow-up in ID clinic after discharge.  Acute pulmonary embolism Thought to be secondary to  septic pulmonary emboli.  Initially was on heparin which was discontinued.  Patient developed anemia.  No DVT in the lower extremity.  Pulmonary was involved and there is no recommendation for ongoing anticoagulation.  .  Diabetic ketoacidosis with history of diabetes mellitus type 2. Present on admission likely secondary to infection  Was initially on insulin drip.  Has been transitioned to Lantus and sliding scale insulin.  We will continue Accu-Cheks, diabetic diet close monitoring of blood glucose levels.  Latest venous glucose of 92.  Hemoglobin A1c of 12.3.  Tachycardia In the setting of acute illness and septic emboli.  We will continue to monitor closely.  Acute on chronic anemia Off anticoagulation at this time.  Received 1 unit of packed RBC during hospitalization.  Globin of seven-point transfuse if less than 7.  Patient did have transient thrombocytopenia while on her menstrual cycle as well.    Thrombocytopenia Likely secondary to acute infection. Resolved at this time.  Now thrombocytosis.  Hyponatremia Improving.  Sodium of 132  Acute kidney injury. Resolved with IV fluids.  Latest creatinine of 0.3  Paroxysmal atrial fibrillation Was initially on amiodarone bolus and drip.  Currently in sinus rhythm but mild tachycardia..  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  Aldolase was elevated.  ANA was negative.  MRI of the bilateral thighs significant for myositis/myofasciitis.  Orthopedics was consulted and recommended conservative treatment.  Overall improving.    Difficult IV access.  Had to put midline yesterday.  We will need to consider PICC line prior to discharge.  Abdominal  pain Nonspecific.  Abdominal x-ray was negative.  History of anxiety depression.  Likely secondary to acute illness.  Atarax has been initiated by psychiatry team.  DVT prophylaxis: SCDs  Code Status: Full code  Family Communication: I tried to reach the  significant other but was unable to reach him.  Disposition Plan:  CIR as per PT recommendation.  Consultants:  PCCM Infectious disease Orthopedic surgery Psychiatry  Procedures:  TRANSTHORACIC ECHOCARDIOGRAM (11/01/2020) IMPRESSIONS    1. Left ventricular ejection fraction, by estimation, is 70 to 75%. The  left ventricle has hyperdynamic function. The left ventricle has no  regional wall motion abnormalities. Indeterminate diastolic filling due to  E-A fusion.   2. Right ventricular systolic function is normal. The right ventricular  size is normal.   3. The mitral valve is normal in structure. No evidence of mitral valve  regurgitation. No evidence of mitral stenosis.   4. The aortic valve is normal in structure. Aortic valve regurgitation is  not visualized. No aortic stenosis is present.   5. The inferior vena cava is normal in size with greater than 50%  respiratory variability, suggesting right atrial pressure of 3 mmHg.   Conclusion(s)/Recommendation(s): No evidence of valvular vegetations on  this transthoracic echocardiogram. Would recommend a transesophageal  echocardiogram to exclude infective endocarditis if clinically indicated  Antimicrobials: Vancomycin IV   Subjective: Today, patient was seen and examined at bedside.  Feels anxious about the whole condition.  Feels weak and has pain in the lower body.  Was not able to sleep well yesterday.   Objective: Vitals:   11/09/20 2018 11/09/20 2331 11/10/20 0345 11/10/20 0800  BP: 114/66 108/66 107/63 131/69  Pulse:  (!) 122 (!) 120 (!) 110  Resp:   (!) 31 (!) 36  Temp: 99.2 F (37.3 C) 98 F (36.7 C) 98.5 F (36.9 C) 98.3 F (36.8 C)  TempSrc: Oral Oral Oral Oral  SpO2:  90% 97% 96%  Weight:      Height:        Intake/Output Summary (Last 24 hours) at 11/10/2020 1033 Last data filed at 11/10/2020 0514 Gross per 24 hour  Intake 700 ml  Output 1900 ml  Net -1200 ml    Filed Weights   11/04/20 0410  11/07/20 0600 11/09/20 0537  Weight: 101.5 kg 101.7 kg 103.5 kg    General:  Average built, not in obvious distress, on nasal canula, mildly anxious HENT:   No scleral pallor or icterus noted. Oral mucosa is moist.  Chest:    Diminished breath sounds bilaterally, coarse breath sounds noted  CVS: S1 &S2 heard. No murmur.  Regular rate and rhythm. Abdomen: Soft, nontender, nondistended.  Bowel sounds are heard.  External urinary catheter in place. Extremities: No cyanosis, clubbing  but non specific tenderness noted.  Peripheral pulses are palpable.  Midline in place.  Left forearm swelling.  Tenderness noted over the bilateral lower extremities with palpation. Psych: Alert, awake and oriented,  CNS:  No cranial nerve deficits.  Moves extremities. Skin: Warm and dry.  No rashes noted.   Data Reviewed: I have reviewed the following labs and imaging studies  CBC Lab Results  Component Value Date   WBC 19.4 (H) 11/10/2020   RBC 2.74 (L) 11/10/2020   HGB 7.5 (L) 11/10/2020   HCT 22.8 (L) 11/10/2020   MCV 83.2 11/10/2020   MCH 27.4 11/10/2020   PLT 467 (H) 11/10/2020   MCHC 32.9 11/10/2020   RDW 17.3 (H) 11/10/2020  LYMPHSABS 3.0 11/06/2020   MONOABS 1.5 (H) 11/06/2020   EOSABS 0.4 11/06/2020   BASOSABS 0.0 09/26/9483     Last metabolic panel Lab Results  Component Value Date   NA 132 (L) 11/10/2020   K 3.8 11/10/2020   CL 98 11/10/2020   CO2 28 11/10/2020   BUN <5 (L) 11/10/2020   CREATININE 0.39 (L) 11/10/2020   GLUCOSE 127 (H) 11/10/2020   GFRNONAA >60 11/10/2020   GFRAA >60 11/10/2016   CALCIUM 7.7 (L) 11/10/2020   PHOS 3.5 11/03/2020   PROT 6.6 11/10/2020   ALBUMIN 1.4 (L) 11/10/2020   BILITOT 0.5 11/10/2020   ALKPHOS 85 11/10/2020   AST 23 11/10/2020   ALT 11 11/10/2020   ANIONGAP 6 11/10/2020    CBG (last 3)  Recent Labs    11/09/20 1105 11/09/20 2017 11/10/20 0754  GLUCAP 128* 104* 92      GFR: Estimated Creatinine Clearance: 118.3 mL/min (A)  (by C-G formula based on SCr of 0.39 mg/dL (L)).  Coagulation Profile: No results for input(s): INR, PROTIME in the last 168 hours.   Recent Results (from the past 240 hour(s))  Resp Panel by RT-PCR (Flu A&B, Covid) Nasopharyngeal Swab     Status: None   Collection Time: 10/31/20 10:53 AM   Specimen: Nasopharyngeal Swab; Nasopharyngeal(NP) swabs in vial transport medium  Result Value Ref Range Status   SARS Coronavirus 2 by RT PCR NEGATIVE NEGATIVE Final    Comment: (NOTE) SARS-CoV-2 target nucleic acids are NOT DETECTED.  The SARS-CoV-2 RNA is generally detectable in upper respiratory specimens during the acute phase of infection. The lowest concentration of SARS-CoV-2 viral copies this assay can detect is 138 copies/mL. A negative result does not preclude SARS-Cov-2 infection and should not be used as the sole basis for treatment or other patient management decisions. A negative result may occur with  improper specimen collection/handling, submission of specimen other than nasopharyngeal swab, presence of viral mutation(s) within the areas targeted by this assay, and inadequate number of viral copies(<138 copies/mL). A negative result must be combined with clinical observations, patient history, and epidemiological information. The expected result is Negative.  Fact Sheet for Patients:  EntrepreneurPulse.com.au  Fact Sheet for Healthcare Providers:  IncredibleEmployment.be  This test is no t yet approved or cleared by the [place] FDA and  has been authorized for detection and/or diagnosis of SARS-CoV-2 by FDA under an Emergency Use Authorization (EUA). This EUA will remain  in effect (meaning this test can be used) for the duration of the COVID-19 declaration under Section 564(b)(1) of the Act, 21 U.S.C.section 360bbb-3(b)(1), unless the authorization is terminated  or revoked sooner.       Influenza A by PCR NEGATIVE NEGATIVE  Final   Influenza B by PCR NEGATIVE NEGATIVE Final    Comment: (NOTE) The Xpert Xpress SARS-CoV-2/FLU/RSV plus assay is intended as an aid in the diagnosis of influenza from Nasopharyngeal swab specimens and should not be used as a sole basis for treatment. Nasal washings and aspirates are unacceptable for Xpert Xpress SARS-CoV-2/FLU/RSV testing.  Fact Sheet for Patients: EntrepreneurPulse.com.au  Fact Sheet for Healthcare Providers: IncredibleEmployment.be  This test is not yet approved or cleared by the [place] FDA and has been authorized for detection and/or diagnosis of SARS-CoV-2 by FDA under an Emergency Use Authorization (EUA). This EUA will remain in effect (meaning this test can be used) for the duration of the COVID-19 declaration under Section 564(b)(1) of the Act, 21 U.S.C. section 360bbb-3(b)(1),  unless the authorization is terminated or revoked.  Performed at [facility], [place] [address], [place], [place] [address]   Blood culture (routine single)     Status: Abnormal   Collection Time: 10/31/20 11:00 AM   Specimen: BLOOD LEFT FOREARM  Result Value Ref Range Status   Specimen Description BLOOD LEFT FOREARM  Final   Special Requests   Final    BOTTLES DRAWN AEROBIC AND ANAEROBIC Blood Culture adequate volume   Culture  Setup Time   Final    GRAM POSITIVE COCCI IN CLUSTERS IN BOTH AEROBIC AND ANAEROBIC BOTTLES Organism ID to follow CRITICAL RESULT CALLED TO, READ BACK BY AND VERIFIED WITH: JJerrilyn Cairo PHARMD, AT 0221 11/01/20 Rush Landmark Performed at [facility], [place] [address] [place], [place] [address]    Culture METHICILLIN RESISTANT STAPHYLOCOCCUS AUREUS (A)  Final   Report Status 11/03/2020 FINAL  Final   Organism ID, Bacteria METHICILLIN RESISTANT STAPHYLOCOCCUS AUREUS  Final      Susceptibility   Methicillin resistant staphylococcus aureus - MIC*    CIPROFLOXACIN >=8 RESISTANT Resistant      ERYTHROMYCIN >=8 RESISTANT Resistant     GENTAMICIN <=0.5 SENSITIVE Sensitive     OXACILLIN >=4 RESISTANT Resistant     TETRACYCLINE <=1 SENSITIVE Sensitive     VANCOMYCIN <=0.5 SENSITIVE Sensitive     TRIMETH/SULFA <=10 SENSITIVE Sensitive     CLINDAMYCIN <=0.25 SENSITIVE Sensitive     RIFAMPIN <=0.5 SENSITIVE Sensitive     Inducible Clindamycin NEGATIVE Sensitive     * METHICILLIN RESISTANT STAPHYLOCOCCUS AUREUS  Blood Culture ID Panel (Reflexed)     Status: Abnormal   Collection Time: 10/31/20 11:00 AM  Result Value Ref Range Status   Enterococcus faecalis NOT DETECTED NOT DETECTED Final   Enterococcus Faecium NOT DETECTED NOT DETECTED Final   Listeria monocytogenes NOT DETECTED NOT DETECTED Final   Staphylococcus species DETECTED (A) NOT DETECTED Final    Comment: CRITICAL RESULT CALLED TO, READ BACK BY AND VERIFIED WITH: J. LEDFORD PHARMD, AT 0221 11/01/20 D. VANHOOK    Staphylococcus aureus (BCID) DETECTED (A) NOT DETECTED Final    Comment: Methicillin (oxacillin)-resistant Staphylococcus aureus (MRSA). MRSA is predictably resistant to beta-lactam antibiotics (except ceftaroline). Preferred therapy is vancomycin unless clinically contraindicated. Patient requires contact precautions if  hospitalized. CRITICAL RESULT CALLED TO, READ BACK BY AND VERIFIED WITH: J. LEDFORD PHARMD, AT 0221 11/01/20 D. VANHOOK    Staphylococcus epidermidis NOT DETECTED NOT DETECTED Final   Staphylococcus lugdunensis NOT DETECTED NOT DETECTED Final   Streptococcus species NOT DETECTED NOT DETECTED Final   Streptococcus agalactiae NOT DETECTED NOT DETECTED Final   Streptococcus pneumoniae NOT DETECTED NOT DETECTED Final   Streptococcus pyogenes NOT DETECTED NOT DETECTED Final   A.calcoaceticus-baumannii NOT DETECTED NOT DETECTED Final   Bacteroides fragilis NOT DETECTED NOT DETECTED Final   Enterobacterales NOT DETECTED NOT DETECTED Final   Enterobacter cloacae complex NOT DETECTED NOT DETECTED Final    Escherichia coli NOT DETECTED NOT DETECTED Final   Klebsiella aerogenes NOT DETECTED NOT DETECTED Final   Klebsiella oxytoca NOT DETECTED NOT DETECTED Final   Klebsiella pneumoniae NOT DETECTED NOT DETECTED Final   Proteus species NOT DETECTED NOT DETECTED Final   Salmonella species NOT DETECTED NOT DETECTED Final   Serratia marcescens NOT DETECTED NOT DETECTED Final   Haemophilus influenzae NOT DETECTED NOT DETECTED Final   Neisseria meningitidis NOT DETECTED NOT DETECTED Final   Pseudomonas aeruginosa NOT DETECTED NOT DETECTED Final   Stenotrophomonas maltophilia  NOT DETECTED NOT DETECTED Final   Candida albicans NOT DETECTED NOT DETECTED Final   Candida auris NOT DETECTED NOT DETECTED Final   Candida glabrata NOT DETECTED NOT DETECTED Final   Candida krusei NOT DETECTED NOT DETECTED Final   Candida parapsilosis NOT DETECTED NOT DETECTED Final   Candida tropicalis NOT DETECTED NOT DETECTED Final   Cryptococcus neoformans/gattii NOT DETECTED NOT DETECTED Final   Meth resistant mecA/C and MREJ DETECTED (A) NOT DETECTED Final    Comment: CRITICAL RESULT CALLED TO, READ BACK BY AND VERIFIED WITH: J. LEDFORD PHARMD, AT 0221 11/01/20 Rush Landmark Performed at [facility] [address], [place], [place] [address]   Urine Culture     Status: Abnormal   Collection Time: 10/31/20  1:29 PM   Specimen: In/Out Cath Urine  Result Value Ref Range Status   Specimen Description IN/OUT CATH URINE  Final   Special Requests   Final    NONE Performed at [facility], [place] [address], [place], [place] [address]    Culture MULTIPLE SPECIES PRESENT, SUGGEST RECOLLECTION (A)  Final   Report Status 11/01/2020 FINAL  Final  MRSA Next Gen by PCR, Nasal     Status: Abnormal   Collection Time: 10/31/20  7:50 PM   Specimen: Nasal Mucosa; Nasal Swab  Result Value Ref Range Status   MRSA by PCR Next Gen DETECTED (A) NOT DETECTED Final    Comment: RESULT CALLED TO, READ BACK BY AND VERIFIED  WITH: WARNER,B RN AT 2144 10/31/2020 MITCHELL,L (NOTE) The GeneXpert MRSA Assay (FDA approved for NASAL specimens only), is one component of a comprehensive MRSA colonization surveillance program. It is not intended to diagnose MRSA infection nor to guide or monitor treatment for MRSA infections. Test performance is not FDA approved in patients less than 33 years old. Performed at [facility], [place] [address] [place], [place] [address]   Culture, blood (routine x 2)     Status: None   Collection Time: 11/01/20 10:22 AM   Specimen: BLOOD RIGHT FOREARM  Result Value Ref Range Status   Specimen Description BLOOD RIGHT FOREARM  Final   Special Requests   Final    BOTTLES DRAWN AEROBIC AND ANAEROBIC Blood Culture adequate volume   Culture   Final    NO GROWTH 5 DAYS Performed at [facility], [place] [address], [place], [place] [address]    Report Status 11/06/2020 FINAL  Final  Culture, blood (routine x 2)     Status: None   Collection Time: 11/01/20 10:29 AM   Specimen: BLOOD RIGHT HAND  Result Value Ref Range Status   Specimen Description BLOOD RIGHT HAND  Final   Special Requests   Final    BOTTLES DRAWN AEROBIC AND ANAEROBIC Blood Culture adequate volume   Culture   Final    NO GROWTH 5 DAYS Performed at [facility], [place] [address] [place], [place] [address]    Report Status 11/06/2020 FINAL  Final  Culture, blood (routine x 2)     Status: Abnormal   Collection Time: 11/04/20  5:50 AM   Specimen: BLOOD RIGHT HAND  Result Value Ref Range Status   Specimen Description BLOOD RIGHT HAND  Final   Special Requests   Final    BOTTLES DRAWN AEROBIC ONLY Blood Culture results may not be optimal due to an inadequate volume of blood received in culture bottles   Culture  Setup Time   Final  GRAM POSITIVE COCCI IN CLUSTERS AEROBIC BOTTLE ONLY CRITICAL RESULT CALLED TO, READ BACK BY AND VERIFIED WITH: L. SEAY,PHARMD 0211 11/05/2020 T. TYSOR    Culture (A)  Final     STAPHYLOCOCCUS AUREUS SUSCEPTIBILITIES PERFORMED ON PREVIOUS CULTURE WITHIN THE LAST 5 DAYS. Performed at [facility], [place] [address], [place], [place] [address]    Report Status 11/07/2020 FINAL  Final  Culture, blood (routine x 2)     Status: None   Collection Time: 11/04/20  6:00 AM   Specimen: BLOOD RIGHT HAND  Result Value Ref Range Status   Specimen Description BLOOD RIGHT HAND  Final   Special Requests   Final    BOTTLES DRAWN AEROBIC ONLY Blood Culture adequate volume   Culture   Final    NO GROWTH 5 DAYS Performed at [facility], [place] [address] [place], [place] [address]    Report Status 11/09/2020 FINAL  Final  Culture, blood (routine x 2)     Status: Abnormal   Collection Time: 11/05/20  8:17 AM   Specimen: BLOOD  Result Value Ref Range Status   Specimen Description BLOOD RIGHT ANTECUBITAL  Final   Special Requests   Final    BOTTLES DRAWN AEROBIC AND ANAEROBIC Blood Culture results may not be optimal due to an inadequate volume of blood received in culture bottles   Culture  Setup Time   Final    GRAM POSITIVE COCCI IN CLUSTERS ANAEROBIC BOTTLE ONLY CRITICAL VALUE NOTED.  VALUE IS CONSISTENT WITH PREVIOUSLY REPORTED AND CALLED VALUE. Performed at [facility], [place] [address] [place], [place] [address]    Culture METHICILLIN RESISTANT STAPHYLOCOCCUS AUREUS (A)  Final   Report Status 11/08/2020 FINAL  Final   Organism ID, Bacteria METHICILLIN RESISTANT STAPHYLOCOCCUS AUREUS  Final      Susceptibility   Methicillin resistant staphylococcus aureus - MIC*    CIPROFLOXACIN >=8 RESISTANT Resistant     ERYTHROMYCIN >=8 RESISTANT Resistant     GENTAMICIN <=0.5 SENSITIVE Sensitive     OXACILLIN >=4 RESISTANT Resistant     TETRACYCLINE <=1 SENSITIVE Sensitive     VANCOMYCIN 1 SENSITIVE Sensitive     TRIMETH/SULFA <=10 SENSITIVE Sensitive     CLINDAMYCIN <=0.25 SENSITIVE Sensitive     RIFAMPIN <=0.5 SENSITIVE Sensitive     Inducible Clindamycin  NEGATIVE Sensitive     * METHICILLIN RESISTANT STAPHYLOCOCCUS AUREUS  Culture, blood (routine x 2)     Status: None   Collection Time: 11/05/20 10:06 AM   Specimen: BLOOD RIGHT HAND  Result Value Ref Range Status   Specimen Description BLOOD RIGHT HAND  Final   Special Requests   Final    BOTTLES DRAWN AEROBIC AND ANAEROBIC Blood Culture adequate volume   Culture   Final    NO GROWTH 5 DAYS Performed at [facility], [address] [place], [place] [address]    Report Status 11/10/2020 FINAL  Final  Culture, blood (routine x 2)     Status: None (Preliminary result)   Collection Time: 11/07/20 11:36 AM   Specimen: BLOOD  Result Value Ref Range Status   Specimen Description BLOOD RIGHT ANTECUBITAL  Final   Special Requests   Final    BOTTLES DRAWN AEROBIC ONLY Blood Culture adequate volume   Culture   Final    NO GROWTH 3 DAYS Performed at [facility], [place] [address] [place], [place] [address]    Report Status PENDING  Incomplete  Culture, blood (routine x 2)     Status: None (Preliminary result)   Collection Time: 11/07/20 11:36 AM   Specimen: BLOOD RIGHT HAND  Result Value Ref Range Status   Specimen Description BLOOD RIGHT HAND  Final   Special Requests   Final    BOTTLES DRAWN AEROBIC ONLY Blood Culture adequate volume   Culture   Final    NO GROWTH 3 DAYS Performed at [facility], [address] [place], [place] [address]    Report Status PENDING  Incomplete     Radiology Studies: VAS [place] UPPER EXTREMITY VENOUS DUPLEX  Result Date: 11/09/2020 UPPER VENOUS STUDY  Patient Name:  SHIKARA MCAULIFFE  Date of Exam:   11/09/2020 Medical Rec #: [id number]       Accession #:    [id number] Date of Birth: June 21, 1990        Patient Gender: F Patient Age:   64 years Exam [facility]:  [facility] Procedure:      VAS [place] UPPER EXTREMITY VENOUS DUPLEX Referring Phys: Corrie Mckusick Taneasha Fuqua --------------------------------------------------------------------------------   Indications: Pain, and Swelling Comparison Study: Previous exam 11/06/20 - negative Performing Technologist: Rogelia Rohrer RVT, RDMS  Examination Guidelines: A complete evaluation includes B-mode imaging, spectral Doppler, color Doppler, and power Doppler as needed of all accessible portions of each vessel. Bilateral testing is considered an integral part of a complete examination. Limited examinations for reoccurring indications may be performed as noted.  Right Findings: +----------+------------+---------+-----------+----------+-------+ RIGHT     CompressiblePhasicitySpontaneousPropertiesSummary +----------+------------+---------+-----------+----------+-------+ Subclavian    Full       Yes       Yes                      +----------+------------+---------+-----------+----------+-------+  Left Findings: +----------+------------+---------+-----------+----------+--------------------+ LEFT      CompressiblePhasicitySpontaneousProperties      Summary        +----------+------------+---------+-----------+----------+--------------------+ IJV           Full       Yes       Yes                                   +----------+------------+---------+-----------+----------+--------------------+ Subclavian    Full       Yes       Yes                                   +----------+------------+---------+-----------+----------+--------------------+ Axillary      Full       Yes       Yes                                   +----------+------------+---------+-----------+----------+--------------------+ Brachial      Full       Yes       Yes                                   +----------+------------+---------+-----------+----------+--------------------+ Radial        Full                                                       +----------+------------+---------+-----------+----------+--------------------+  Ulnar         Full                                                        +----------+------------+---------+-----------+----------+--------------------+ Cephalic      None       No        No                 Acute - prox-mid                                                             forearm        +----------+------------+---------+-----------+----------+--------------------+ Basilic       Full       Yes       Yes                                   +----------+------------+---------+-----------+----------+--------------------+  Summary:  Right: No evidence of thrombosis in the subclavian.  Left: No evidence of deep vein thrombosis in the upper extremity. Findings consistent with acute superficial vein thrombosis involving the left cephalic vein.  *See table(s) above for measurements and observations.  Diagnosing physician: Harold Barban MD Electronically signed by Harold Barban MD on 11/09/2020 at 6:54:18 PM.    Final      Scheduled Meds:  bisacodyl  10 mg Rectal Once   Chlorhexidine Gluconate Cloth  6 each Topical Daily   feeding supplement  237 mL Oral BID BM   insulin aspart  0-15 Units Subcutaneous TID WC   insulin aspart  10 Units Subcutaneous TID WC   insulin glargine-yfgn  35 Units Subcutaneous Daily   insulin starter kit- pen needles  1 kit Other Once   living well with diabetes book   Does not apply Once   multivitamin with minerals  1 tablet Oral Daily   polyethylene glycol  17 g Oral BID   senna-docusate  1 tablet Oral BID   sodium chloride flush  10-40 mL Intracatheter Q12H   Continuous Infusions:  vancomycin HCl 1,750 mg (11/09/20 2116)     LOS: 10 days   Flora Lipps, MD [facility] 11/10/2020, 10:33 AM  If 7PM-7AM, please contact night-coverage [email or web address]

## 2020-11-10 NOTE — Consult Note (Signed)
[facility] Face-to-Face Psychiatry Consult   Reason for Consult:  depression and anxiety Referring Physician:  Flora Lipps, MD Patient Identification: Chelsea Mathis MRN:  [id number] Principal Diagnosis: Severe sepsis [facility]) Diagnosis:  Principal Problem:   Severe sepsis ([place]) Active Problems:   DKA (diabetic ketoacidosis) ([place])   Bacteremia due to Staphylococcus aureus   Septic pulmonary embolism ([place])   Type 2 diabetes mellitus with hypoglycemia without coma ([place])   Myositis   Myofasciitis   Adjustment disorder with mixed anxiety and depressed mood   Total Time spent with patient: 20 minutes  Subjective:   Chelsea Mathis is a 30 y.o. female patient admitted with MRSA bacteremia. On assessment, Chelsea Mathis states that Chelsea Mathis feels overwhelmed because Chelsea Mathis has been dealing with the pain for some time, and has been feeling "crazy," which Chelsea Mathis defines as unsure of what's going on and trying to piece together the information given but being unable to do so. Chelsea Mathis is tearful, and states that Chelsea Mathis just wants to live for her 2 boys. This Probation officer explained to Chelsea Mathis her diagnoses, assured her that her labs have been improving, and reminded her that her uncontrolled diabetes makes her more susceptible to infections. Chelsea Mathis also informed that Chelsea Mathis has had a decreased appetite (although Chelsea Mathis forces herself to eat something) as well as poor sleep. Chelsea Mathis says that Chelsea Mathis has been getting about 3 hours of sleep, waking up dyspneic and with palpitations. Chelsea Mathis denies SI/HI/AVH.  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  Past Psychiatric History: None  Risk to Self:  No Risk to Others:  No Prior Inpatient Therapy:  No Prior Outpatient Therapy:  No  Past Medical History:  Past Medical History:  Diagnosis Date   Diabetes mellitus ([place])     Gestational diabetes     Past Surgical History:  Procedure Laterality Date   LAPAROSCOPIC APPENDECTOMY N/A 11/11/2016   Procedure: APPENDECTOMY LAPAROSCOPIC;  Surgeon: Georganna Skeans, MD;  Location: [facility];  Service: General;  Laterality: N/A;   TEE WITHOUT CARDIOVERSION N/A 11/03/2020   Procedure: TRANSESOPHAGEAL ECHOCARDIOGRAM (TEE);  Surgeon: Buford Dresser, MD;  Location: [facility] ENDOSCOPY;  Service: Cardiovascular;  Laterality: N/A;   Family History:  Family History  Problem Relation Age of Onset   Diabetes Mother    Hypertension Mother    Diabetes Maternal Uncle    Diabetes Brother    Stroke Maternal Grandmother    Leukemia Paternal Grandmother    Leukemia Paternal Grandfather    Family Psychiatric  History: Unknown Social History:  Social History   Substance and Sexual Activity  Alcohol Use No   Alcohol/week: 0.0 standard drinks     Social History   Substance and Sexual Activity  Drug Use No    Social History   Socioeconomic History   Marital status: Single    Spouse name: Not on file   Number of children: Not on file   Years of education: Not on file   Highest education level: Not on file  Occupational History   Not on file  Tobacco Use   Smoking status: Never   Smokeless tobacco: Never  Substance and Sexual Activity   Alcohol use: No    Alcohol/week: 0.0 standard drinks   Drug use: No   Sexual activity: Yes  Other Topics Concern   Not on file  Social History Narrative  Not on file   Social Determinants of Health   Financial Resource Strain: Not on file  Food Insecurity: Not on file  Transportation Needs: Not on file  Physical Activity: Not on file  Stress: Not on file  Social Connections: Not on file   Additional Social History:    Allergies:  No Known Allergies  Labs:  Results for orders placed or performed during the hospital encounter of 10/31/20 (from the past 48 hour(s))  Glucose, capillary     Status: Abnormal   Collection Time:  11/08/20 11:51 AM  Result Value Ref Range   Glucose-Capillary 136 (H) 70 - 99 mg/dL    Comment: Glucose reference range applies only to samples taken after fasting for at least 8 hours.  Glucose, capillary     Status: Abnormal   Collection Time: 11/08/20  4:05 PM  Result Value Ref Range   Glucose-Capillary 139 (H) 70 - 99 mg/dL    Comment: Glucose reference range applies only to samples taken after fasting for at least 8 hours.  Glucose, capillary     Status: Abnormal   Collection Time: 11/08/20  8:36 PM  Result Value Ref Range   Glucose-Capillary 160 (H) 70 - 99 mg/dL    Comment: Glucose reference range applies only to samples taken after fasting for at least 8 hours.  Comprehensive metabolic panel     Status: Abnormal   Collection Time: 11/09/20  3:54 AM  Result Value Ref Range   Sodium 131 (L) 135 - 145 mmol/L   Potassium 3.9 3.5 - 5.1 mmol/L   Chloride 95 (L) 98 - 111 mmol/L   CO2 28 22 - 32 mmol/L   Glucose, Bld 100 (H) 70 - 99 mg/dL    Comment: Glucose reference range applies only to samples taken after fasting for at least 8 hours.   BUN <5 (L) 6 - 20 mg/dL   Creatinine, Ser 0.37 (L) 0.44 - 1.00 mg/dL   Calcium 7.7 (L) 8.9 - 10.3 mg/dL   Total Protein 6.4 (L) 6.5 - 8.1 g/dL   Albumin 1.4 (L) 3.5 - 5.0 g/dL   AST 21 15 - 41 U/L   ALT 10 0 - 44 U/L   Alkaline Phosphatase 85 38 - 126 U/L   Total Bilirubin 0.8 0.3 - 1.2 mg/dL   GFR, Estimated >60 >60 mL/min    Comment: (NOTE) Calculated using the CKD-EPI Creatinine Equation (2021)    Anion gap 8 5 - 15    Comment: Performed at [facility], [place] [address] [place], [place] [address]  CBC     Status: Abnormal   Collection Time: 11/09/20  3:54 AM  Result Value Ref Range   WBC 23.8 (H) 4.0 - 10.5 K/uL   RBC 2.79 (L) 3.87 - 5.11 MIL/uL   Hemoglobin 7.5 (L) 12.0 - 15.0 g/dL   HCT 23.2 (L) 36.0 - 46.0 %   MCV 83.2 80.0 - 100.0 fL   MCH 26.9 26.0 - 34.0 pg   MCHC 32.3 30.0 - 36.0 g/dL   RDW 16.4 (H) 11.5 - 15.5 %    Platelets 467 (H) 150 - 400 K/uL   nRBC 0.3 (H) 0.0 - 0.2 %    Comment: Performed at [facility] [address] [place], [place] [address]  Glucose, capillary     Status: None   Collection Time: 11/09/20  7:25 AM  Result Value Ref Range   Glucose-Capillary 89 70 - 99 mg/dL  Comment: Glucose reference range applies only to samples taken after fasting for at least 8 hours.  Glucose, capillary     Status: Abnormal   Collection Time: 11/09/20 11:05 AM  Result Value Ref Range   Glucose-Capillary 128 (H) 70 - 99 mg/dL    Comment: Glucose reference range applies only to samples taken after fasting for at least 8 hours.  Glucose, capillary     Status: Abnormal   Collection Time: 11/09/20  8:17 PM  Result Value Ref Range   Glucose-Capillary 104 (H) 70 - 99 mg/dL    Comment: Glucose reference range applies only to samples taken after fasting for at least 8 hours.  Comprehensive metabolic panel     Status: Abnormal   Collection Time: 11/10/20  5:14 AM  Result Value Ref Range   Sodium 132 (L) 135 - 145 mmol/L   Potassium 3.8 3.5 - 5.1 mmol/L   Chloride 98 98 - 111 mmol/L   CO2 28 22 - 32 mmol/L   Glucose, Bld 127 (H) 70 - 99 mg/dL    Comment: Glucose reference range applies only to samples taken after fasting for at least 8 hours.   BUN <5 (L) 6 - 20 mg/dL   Creatinine, Ser 0.39 (L) 0.44 - 1.00 mg/dL   Calcium 7.7 (L) 8.9 - 10.3 mg/dL   Total Protein 6.6 6.5 - 8.1 g/dL   Albumin 1.4 (L) 3.5 - 5.0 g/dL   AST 23 15 - 41 U/L   ALT 11 0 - 44 U/L   Alkaline Phosphatase 85 38 - 126 U/L   Total Bilirubin 0.5 0.3 - 1.2 mg/dL   GFR, Estimated >60 >60 mL/min    Comment: (NOTE) Calculated using the CKD-EPI Creatinine Equation (2021)    Anion gap 6 5 - 15    Comment: Performed at [facility], [place] [address] [place], [place] [address]  CBC     Status: Abnormal   Collection Time: 11/10/20  5:14 AM  Result Value Ref Range   WBC 19.4 (H) 4.0 - 10.5 K/uL   RBC 2.74 (L) 3.87 - 5.11  MIL/uL   Hemoglobin 7.5 (L) 12.0 - 15.0 g/dL   HCT 22.8 (L) 36.0 - 46.0 %   MCV 83.2 80.0 - 100.0 fL   MCH 27.4 26.0 - 34.0 pg   MCHC 32.9 30.0 - 36.0 g/dL   RDW 17.3 (H) 11.5 - 15.5 %   Platelets 467 (H) 150 - 400 K/uL   nRBC 0.3 (H) 0.0 - 0.2 %    Comment: Performed at [facility] [address], [place], [place] [address]  Magnesium     Status: None   Collection Time: 11/10/20  5:14 AM  Result Value Ref Range   Magnesium 2.0 1.7 - 2.4 mg/dL    Comment: Performed at [facility] [address], [place], [place] [address]  Glucose, capillary     Status: None   Collection Time: 11/10/20  7:54 AM  Result Value Ref Range   Glucose-Capillary 92 70 - 99 mg/dL    Comment: Glucose reference range applies only to samples taken after fasting for at least 8 hours.    Current Facility-Administered Medications  Medication Dose Route Frequency Provider Last Rate Last Admin   acetaminophen (TYLENOL) tablet 650 mg  650 mg Oral Q6H PRN Buford Dresser, MD   650 mg at 11/05/20 2051   bisacodyl (DULCOLAX) suppository 10 mg  10 mg Rectal Once Mariel Aloe,  MD       Chlorhexidine Gluconate Cloth 2 % PADS 6 each  6 each Topical Daily Buford Dresser, MD   6 each at 11/09/20 0814   dextrose 50 % solution 0-50 mL  0-50 mL Intravenous PRN Buford Dresser, MD       feeding supplement (ENSURE ENLIVE / ENSURE PLUS) liquid 237 mL  237 mL Oral BID BM Pokhrel, Laxman, MD       HYDROmorphone (DILAUDID) injection 0.5 mg  0.5 mg Intravenous Q4H PRN Mariel Aloe, MD   0.5 mg at 11/10/20 1110   hydrOXYzine (ATARAX/VISTARIL) tablet 25 mg  25 mg Oral TID PRN Mariel Aloe, MD   25 mg at 11/09/20 2108   insulin aspart (novoLOG) injection 0-15 Units  0-15 Units Subcutaneous TID WC Buford Dresser, MD   2 Units at 11/09/20 1705   insulin aspart (novoLOG) injection 10 Units  10 Units Subcutaneous TID WC Buford Dresser, MD   10 Units at 11/04/20 1732   insulin  glargine-yfgn (SEMGLEE) injection 35 Units  35 Units Subcutaneous Daily Buford Dresser, MD   35 Units at 11/10/20 1109   insulin starter kit- pen needles (English) 1 kit  1 kit Other Once Buford Dresser, MD       living well with diabetes book MISC   Does not apply Once Buford Dresser, MD       methocarbamol (ROBAXIN) tablet 500 mg  500 mg Oral Q8H PRN Mariel Aloe, MD   500 mg at 11/09/20 1704   multivitamin with minerals tablet 1 tablet  1 tablet Oral Daily Buford Dresser, MD   1 tablet at 11/10/20 0831   oxyCODONE-acetaminophen (PERCOCET/ROXICET) 5-325 MG per tablet 1-2 tablet  1-2 tablet Oral Q4H PRN Buford Dresser, MD   2 tablet at 11/10/20 0829   polyethylene glycol (MIRALAX / GLYCOLAX) packet 17 g  17 g Oral BID Mariel Aloe, MD   17 g at 11/10/20 8295   senna-docusate (Senokot-S) tablet 1 tablet  1 tablet Oral BID Mariel Aloe, MD   1 tablet at 11/10/20 0831   sodium chloride flush (NS) 0.9 % injection 10-40 mL  10-40 mL Intracatheter Q12H Pokhrel, Laxman, MD   10 mL at 11/10/20 6213   sodium chloride flush (NS) 0.9 % injection 10-40 mL  10-40 mL Intracatheter PRN Pokhrel, Laxman, MD       vancomycin (VANCOREADY) IVPB 1750 mg/350 mL  1,750 mg Intravenous Q12H Mariel Aloe, MD 175 mL/hr at 11/09/20 2116 1,750 mg at 11/09/20 2116    Musculoskeletal: Strength & Muscle Tone: within normal limits Gait & Station:  unobserved; patient in bed Patient leans: N/A    Psychiatric Specialty Exam:  Presentation  General Appearance: Appropriate for Environment; Well Groomed  Eye Contact:Good  Speech:Normal Rate  Speech Volume:Normal  Handedness: No data recorded  Mood and Affect  Mood:Depressed; Hopeless  Affect:Depressed; Tearful   Thought Process  Thought Processes:Coherent; Linear  Descriptions of Associations:Intact  Orientation:Full (Time, Place and Person)  Thought Content:Logical; WDL  History of  Schizophrenia/Schizoaffective disorder:No data recorded Duration of Psychotic Symptoms:No data recorded Hallucinations:Hallucinations: None  Ideas of Reference:None  Suicidal Thoughts:Suicidal Thoughts: No  Homicidal Thoughts:Homicidal Thoughts: No   Sensorium  Memory:Immediate Good; Recent Good; Remote Good  Judgment:Good  Insight:Fair   Executive Functions  Concentration:Good  Attention Span:Good  [place] of Knowledge:Good  Language:Good   Psychomotor Activity  Psychomotor Activity:Psychomotor Activity: Normal   Assets  Assets:Communication Skills; Desire for Improvement; Housing;  Financial Resources/Insurance; Resilience; Social Support; Intimacy   Sleep  Sleep:Sleep: Poor Number of Hours of Sleep: 3   Physical Exam: Physical Exam Vitals reviewed.  HENT:     Head: Normocephalic and atraumatic.  Eyes:     Extraocular Movements: Extraocular movements intact.  Cardiovascular:     Rate and Rhythm: Tachycardia present.  Pulmonary:     Effort: Pulmonary effort is normal.  Musculoskeletal:     Cervical back: Normal range of motion.  Neurological:     General: No focal deficit present.     Mental Status: Chelsea Mathis is alert and oriented to person, place, and time.  Psychiatric:        Behavior: Behavior normal.        Thought Content: Thought content normal.        Judgment: Judgment normal.   Review of Systems  Psychiatric/Behavioral:  Positive for depression. Negative for hallucinations, memory loss, substance abuse and suicidal ideas. The patient is nervous/anxious and has insomnia.   All other systems reviewed and are negative. Blood pressure 131/69, pulse (!) 110, temperature 98.3 F (36.8 C), temperature source Oral, resp. rate (!) 36, height _0  (1.626 m), weight 103.5 kg, last menstrual period 10/31/2020, SpO2 96 %, unknown if currently breastfeeding. Body mass index is 39.17 kg/m.  Treatment Plan Summary: Chelsea Mathis is a 30 year old  female with no psychiatric history consulted to the psychiatry service for depression and anxiety 2/2 feeling overwhelmed by her MRSA bacteremia and TV endocarditis diagnoses. Chelsea Mathis does not meet inpatient psychiatry criteria at this time.  Adjustment disorder w/ mixed depression and anxiety Insomnia -Recommend that a consult be placed to have [facility] speak with the chaplain.  -Start taking Hydroxyzine 25 mg BID PRN anxiety and 50 mg qHS PRN insomnia    Disposition: No evidence of imminent risk to self or others at present.   Patient does not meet criteria for psychiatric inpatient admission. Psychiatry will continue to follow.  Rosezetta Schlatter, MD 11/10/2020 11:17 AM

## 2020-11-10 NOTE — Progress Notes (Signed)
Pharmacy Antibiotic Note  Chelsea Mathis is a 30 y.o. 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 Pharmacy has been consulted for vancomycin. Vancomycin peak/trough drawn today, calculated AUC of 494 within goal range   Plan: Continue vancomycin 1750mg  IV Q12H   Height: 5\' 4"  (162.6 cm) Weight: 103.5 kg (228 lb 2.8 oz) IBW/kg (Calculated) : 54.7  Temp (24hrs), Avg:98.2 F (36.8 C), Min:97.9 F (36.6 C), Max:98.5 F (36.9 C)  Recent Labs  Lab 11/05/20 0157 11/05/20 2015 11/06/20 0523 11/06/20 0748 11/06/20 1634 11/06/20 1907 11/07/20 0455 11/07/20 1137 11/08/20 0250 11/08/20 0810 11/09/20 0354 11/10/20 0514 11/10/20 1424 11/10/20 2153  WBC 28.6*  --  34.2*   < > 40.2*  --  38.5*  --  28.4*  --  23.8* 19.4*  --   --   CREATININE 0.42*  --  0.41*  --   --   --   --   --   --  0.40* 0.37* 0.39*  --   --   VANCOTROUGH  --   --  24*  --   --   --  8*  --   --   --   --   --   --  11*  VANCOPEAK  --    < >  --   --   --  38  --   --   --   --   --   --  32  --   VANCORANDOM  --   --   --   --   --   --   --  16  --   --   --   --   --   --    < > = values in this interval not displayed.     Estimated Creatinine Clearance: 118.3 mL/min (A) (by C-G formula based on SCr of 0.39 mg/dL (L)).    No Known Allergies  Antimicrobials this admission: Vanc 8/01 >> Zosyn 8/01 > 8/02 Clinda x 1 8/01  Microbiology results: 8/02 blood: NGTD 8/01 urine cx: contaminated sample, needs redo 8/01 blood 2/2: MRSA 8/01 Resp panel: neg  10/01, PharmD, BCPS  11/10/2020, 11:36 PM

## 2020-11-10 NOTE — Progress Notes (Signed)
PHARMACY CONSULT NOTE FOR:  OUTPATIENT  PARENTERAL ANTIBIOTIC THERAPY (OPAT)  Indication: MRSA endocarditis Regimen: vancomycin 1750mg  IV q12h End date: 12/19/20  IV antibiotic discharge orders are pended. To discharging provider:  please sign these orders via discharge navigator,  Select New Orders & click on the button choice - Manage This Unsigned Work.     Thank you for allowing pharmacy to be a part of this patient's care.  12/21/20 11/10/2020, 8:47 AM

## 2020-11-11 ENCOUNTER — Inpatient Hospital Stay (HOSPITAL_COMMUNITY)
Admission: RE | Admit: 2020-11-11 | Discharge: 2020-11-29 | DRG: 945 | Disposition: A | Discharge status: Home Health | Payer: Medicaid Other | Source: Intra-hospital | Attending: Physical Medicine & Rehabilitation | Admitting: Physical Medicine & Rehabilitation

## 2020-11-11 ENCOUNTER — Other Ambulatory Visit: Payer: Self-pay

## 2020-11-11 ENCOUNTER — Encounter (HOSPITAL_COMMUNITY): Payer: Self-pay | Admitting: Physical Medicine & Rehabilitation

## 2020-11-11 DIAGNOSIS — F419 Anxiety disorder, unspecified: Secondary | ICD-10-CM | POA: Diagnosis present

## 2020-11-11 DIAGNOSIS — Z794 Long term (current) use of insulin: Secondary | ICD-10-CM | POA: Diagnosis not present

## 2020-11-11 DIAGNOSIS — Z806 Family history of leukemia: Secondary | ICD-10-CM

## 2020-11-11 DIAGNOSIS — E11649 Type 2 diabetes mellitus with hypoglycemia without coma: Secondary | ICD-10-CM | POA: Diagnosis not present

## 2020-11-11 DIAGNOSIS — Z823 Family history of stroke: Secondary | ICD-10-CM

## 2020-11-11 DIAGNOSIS — Z86711 Personal history of pulmonary embolism: Secondary | ICD-10-CM

## 2020-11-11 DIAGNOSIS — M60000 Infective myositis, unspecified right arm: Secondary | ICD-10-CM

## 2020-11-11 DIAGNOSIS — E1165 Type 2 diabetes mellitus with hyperglycemia: Secondary | ICD-10-CM | POA: Diagnosis present

## 2020-11-11 DIAGNOSIS — Z833 Family history of diabetes mellitus: Secondary | ICD-10-CM

## 2020-11-11 DIAGNOSIS — R5381 Other malaise: Secondary | ICD-10-CM | POA: Diagnosis present

## 2020-11-11 DIAGNOSIS — Z8249 Family history of ischemic heart disease and other diseases of the circulatory system: Secondary | ICD-10-CM | POA: Diagnosis not present

## 2020-11-11 DIAGNOSIS — E669 Obesity, unspecified: Secondary | ICD-10-CM

## 2020-11-11 DIAGNOSIS — D62 Acute posthemorrhagic anemia: Secondary | ICD-10-CM | POA: Diagnosis present

## 2020-11-11 DIAGNOSIS — M609 Myositis, unspecified: Secondary | ICD-10-CM | POA: Diagnosis present

## 2020-11-11 DIAGNOSIS — B9562 Methicillin resistant Staphylococcus aureus infection as the cause of diseases classified elsewhere: Secondary | ICD-10-CM

## 2020-11-11 DIAGNOSIS — E871 Hypo-osmolality and hyponatremia: Secondary | ICD-10-CM

## 2020-11-11 DIAGNOSIS — M60051 Infective myositis, right thigh: Secondary | ICD-10-CM | POA: Diagnosis not present

## 2020-11-11 DIAGNOSIS — D72829 Elevated white blood cell count, unspecified: Secondary | ICD-10-CM | POA: Diagnosis not present

## 2020-11-11 DIAGNOSIS — M60859 Other myositis, unspecified thigh: Secondary | ICD-10-CM

## 2020-11-11 DIAGNOSIS — E1169 Type 2 diabetes mellitus with other specified complication: Secondary | ICD-10-CM

## 2020-11-11 DIAGNOSIS — R040 Epistaxis: Secondary | ICD-10-CM | POA: Diagnosis not present

## 2020-11-11 DIAGNOSIS — I480 Paroxysmal atrial fibrillation: Secondary | ICD-10-CM | POA: Diagnosis present

## 2020-11-11 DIAGNOSIS — M79651 Pain in right thigh: Secondary | ICD-10-CM

## 2020-11-11 DIAGNOSIS — F32A Depression, unspecified: Secondary | ICD-10-CM | POA: Diagnosis present

## 2020-11-11 DIAGNOSIS — Z79899 Other long term (current) drug therapy: Secondary | ICD-10-CM | POA: Diagnosis not present

## 2020-11-11 DIAGNOSIS — M60005 Infective myositis, unspecified leg: Secondary | ICD-10-CM | POA: Diagnosis not present

## 2020-11-11 DIAGNOSIS — I330 Acute and subacute infective endocarditis: Secondary | ICD-10-CM

## 2020-11-11 DIAGNOSIS — R7309 Other abnormal glucose: Secondary | ICD-10-CM

## 2020-11-11 DIAGNOSIS — Z8616 Personal history of COVID-19: Secondary | ICD-10-CM | POA: Diagnosis not present

## 2020-11-11 DIAGNOSIS — M62838 Other muscle spasm: Secondary | ICD-10-CM | POA: Diagnosis not present

## 2020-11-11 DIAGNOSIS — D649 Anemia, unspecified: Secondary | ICD-10-CM | POA: Diagnosis present

## 2020-11-11 DIAGNOSIS — K5900 Constipation, unspecified: Secondary | ICD-10-CM | POA: Diagnosis present

## 2020-11-11 DIAGNOSIS — J3489 Other specified disorders of nose and nasal sinuses: Secondary | ICD-10-CM | POA: Diagnosis present

## 2020-11-11 DIAGNOSIS — R7881 Bacteremia: Secondary | ICD-10-CM

## 2020-11-11 DIAGNOSIS — G4700 Insomnia, unspecified: Secondary | ICD-10-CM | POA: Diagnosis present

## 2020-11-11 LAB — GLUCOSE, CAPILLARY
Glucose-Capillary: 95 mg/dL (ref 70–99)
Glucose-Capillary: 122 mg/dL — ABNORMAL HIGH (ref 70–99)
Glucose-Capillary: 100 mg/dL — ABNORMAL HIGH (ref 70–99)

## 2020-11-11 MED ORDER — INSULIN GLARGINE-YFGN 100 UNIT/ML ~~LOC~~ SOLN
32.0000 [IU] | Freq: Every day | SUBCUTANEOUS | Status: DC
  Administered 2020-11-11: 32 [IU] via SUBCUTANEOUS
  Filled 2020-11-11: qty 0.32

## 2020-11-11 MED ORDER — VANCOMYCIN HCL 1750 MG/350ML IV SOLN: 1750.0000 mg | Freq: Two times a day (BID) | INTRAVENOUS | Status: DC

## 2020-11-11 MED ORDER — POLYETHYLENE GLYCOL 3350 17 G PO PACK
17.0000 g | PACK | Freq: Two times a day (BID) | ORAL | Status: DC
  Administered 2020-11-11 – 2020-11-22 (×20): 17 g via ORAL
  Filled 2020-11-11 (×28): qty 1

## 2020-11-11 MED ORDER — INSULIN ASPART 100 UNIT/ML IJ SOLN: 8.0000 [IU] | Freq: Three times a day (TID) | INTRAMUSCULAR | Status: DC

## 2020-11-11 MED ORDER — OXYCODONE-ACETAMINOPHEN 5-325 MG PO TABS: 1.0000 | ORAL_TABLET | ORAL | Status: DC | PRN

## 2020-11-11 MED ORDER — METHOCARBAMOL 500 MG PO TABS: 500.0000 mg | ORAL_TABLET | Freq: Three times a day (TID) | ORAL | Status: DC | PRN

## 2020-11-11 MED ORDER — INSULIN GLARGINE-YFGN 100 UNIT/ML ~~LOC~~ SOLN
32.0000 [IU] | Freq: Every day | SUBCUTANEOUS | Status: DC
  Administered 2020-11-12: 32 [IU] via SUBCUTANEOUS
  Filled 2020-11-11 (×2): qty 0.32

## 2020-11-11 MED ORDER — INSULIN STARTER KIT- PEN NEEDLES (ENGLISH)
1.0000 | Freq: Once | Status: AC
  Administered 2020-11-11: 1
  Filled 2020-11-11: qty 1

## 2020-11-11 MED ORDER — HYDROXYZINE HCL 25 MG PO TABS: 25.0000 mg | ORAL_TABLET | Freq: Two times a day (BID) | ORAL | 0 refills | Status: DC | PRN

## 2020-11-11 MED ORDER — ADULT MULTIVITAMIN W/MINERALS CH
1.0000 | ORAL_TABLET | Freq: Every day | ORAL | Status: DC
  Administered 2020-11-12 – 2020-11-29 (×18): 1 via ORAL
  Filled 2020-11-11 (×18): qty 1

## 2020-11-11 MED ORDER — LIVING WELL WITH DIABETES BOOK
Freq: Once | Status: AC
  Filled 2020-11-11: qty 1

## 2020-11-11 MED ORDER — POLYETHYLENE GLYCOL 3350 17 G PO PACK: 17.0000 g | PACK | Freq: Two times a day (BID) | ORAL | 0 refills | Status: DC

## 2020-11-11 MED ORDER — SENNOSIDES-DOCUSATE SODIUM 8.6-50 MG PO TABS: 1.0000 | ORAL_TABLET | Freq: Two times a day (BID) | ORAL | Status: AC

## 2020-11-11 MED ORDER — INSULIN GLARGINE-YFGN 100 UNIT/ML ~~LOC~~ SOLN: 32.0000 [IU] | Freq: Every day | SUBCUTANEOUS | Status: DC

## 2020-11-11 MED ORDER — ENSURE ENLIVE PO LIQD: 237.0000 mL | Freq: Two times a day (BID) | ORAL | Status: DC

## 2020-11-11 MED ORDER — ADULT MULTIVITAMIN W/MINERALS CH: 1.0000 | ORAL_TABLET | Freq: Every day | ORAL | Status: AC

## 2020-11-11 MED ORDER — ACETAMINOPHEN 325 MG PO TABS
650.0000 mg | ORAL_TABLET | Freq: Four times a day (QID) | ORAL | Status: DC | PRN
  Administered 2020-11-12 – 2020-11-19 (×3): 650 mg via ORAL
  Filled 2020-11-11 (×6): qty 2

## 2020-11-11 MED ORDER — METHOCARBAMOL 500 MG PO TABS
500.0000 mg | ORAL_TABLET | Freq: Three times a day (TID) | ORAL | Status: DC | PRN
  Administered 2020-11-11 – 2020-11-12 (×2): 500 mg via ORAL
  Filled 2020-11-11 (×2): qty 1

## 2020-11-11 MED ORDER — INSULIN ASPART 100 UNIT/ML IJ SOLN: 0.0000 [IU] | Freq: Three times a day (TID) | INTRAMUSCULAR | 11 refills | Status: DC

## 2020-11-11 MED ORDER — HYDROXYZINE HCL 50 MG PO TABS: 50.0000 mg | ORAL_TABLET | Freq: Every evening | ORAL | Status: DC | PRN

## 2020-11-11 MED ORDER — VANCOMYCIN HCL 1750 MG/350ML IV SOLN
1750.0000 mg | Freq: Two times a day (BID) | INTRAVENOUS | Status: DC
  Administered 2020-11-11 – 2020-11-29 (×36): 1750 mg via INTRAVENOUS
  Filled 2020-11-11 (×38): qty 350

## 2020-11-11 MED ORDER — HYDROXYZINE HCL 25 MG PO TABS
50.0000 mg | ORAL_TABLET | Freq: Every evening | ORAL | Status: DC | PRN
  Administered 2020-11-11 – 2020-11-28 (×13): 50 mg via ORAL
  Filled 2020-11-11 (×13): qty 2

## 2020-11-11 MED ORDER — SENNOSIDES-DOCUSATE SODIUM 8.6-50 MG PO TABS
1.0000 | ORAL_TABLET | Freq: Two times a day (BID) | ORAL | Status: DC
  Administered 2020-11-11 – 2020-11-29 (×34): 1 via ORAL
  Filled 2020-11-11 (×36): qty 1

## 2020-11-11 MED ORDER — INSULIN ASPART 100 UNIT/ML IJ SOLN
0.0000 [IU] | Freq: Three times a day (TID) | INTRAMUSCULAR | Status: DC
  Administered 2020-11-12: 2 [IU] via SUBCUTANEOUS
  Administered 2020-11-14: 3 [IU] via SUBCUTANEOUS
  Administered 2020-11-15: 5 [IU] via SUBCUTANEOUS
  Administered 2020-11-16: 3 [IU] via SUBCUTANEOUS
  Administered 2020-11-17 – 2020-11-19 (×3): 2 [IU] via SUBCUTANEOUS
  Administered 2020-11-20: 3 [IU] via SUBCUTANEOUS
  Administered 2020-11-28 (×3): 2 [IU] via SUBCUTANEOUS
  Administered 2020-11-29: 3 [IU] via SUBCUTANEOUS
  Administered 2020-11-29: 2 [IU] via SUBCUTANEOUS

## 2020-11-11 MED ORDER — ENSURE ENLIVE PO LIQD
237.0000 mL | Freq: Two times a day (BID) | ORAL | Status: DC
  Administered 2020-11-12 – 2020-11-16 (×6): 237 mL via ORAL

## 2020-11-11 MED ORDER — VANCOMYCIN IV (FOR PTA / DISCHARGE USE ONLY): 1750.0000 mg | Freq: Two times a day (BID) | INTRAVENOUS | 0 refills | Status: DC

## 2020-11-11 MED ORDER — INSULIN ASPART 100 UNIT/ML IJ SOLN
8.0000 [IU] | Freq: Three times a day (TID) | INTRAMUSCULAR | Status: DC
  Administered 2020-11-11 – 2020-11-13 (×3): 8 [IU] via SUBCUTANEOUS

## 2020-11-11 MED ORDER — OXYCODONE-ACETAMINOPHEN 5-325 MG PO TABS
1.0000 | ORAL_TABLET | ORAL | Status: DC | PRN
  Administered 2020-11-11 – 2020-11-15 (×20): 2 via ORAL
  Administered 2020-11-16: 1 via ORAL
  Administered 2020-11-16 – 2020-11-17 (×6): 2 via ORAL
  Administered 2020-11-17: 1 via ORAL
  Administered 2020-11-18 – 2020-11-29 (×45): 2 via ORAL
  Filled 2020-11-11 (×2): qty 2
  Filled 2020-11-11: qty 1
  Filled 2020-11-11 (×66): qty 2
  Filled 2020-11-11: qty 1
  Filled 2020-11-11 (×4): qty 2

## 2020-11-11 MED ORDER — HYDROXYZINE HCL 25 MG PO TABS
25.0000 mg | ORAL_TABLET | Freq: Two times a day (BID) | ORAL | Status: DC | PRN
  Administered 2020-11-12 – 2020-11-28 (×22): 25 mg via ORAL
  Filled 2020-11-11 (×26): qty 1

## 2020-11-11 NOTE — Discharge Instructions (Addendum)
Inpatient Rehab Discharge Instructions  Keiyana Slatten Discharge date and time: No discharge date for patient encounter.   Activities/Precautions/ Functional Status: Activity: activity as tolerated Diet: diabetic diet Wound Care: Routine skin checks Functional status:  ___ No restrictions     ___ Walk up steps independently ___ 24/7 supervision/assistance   ___ Walk up steps with assistance ___ Intermittent supervision/assistance  ___ Bathe/dress independently ___ Walk with walker     __x_ Bathe/dress with assistance ___ Walk Independently    ___ Shower independently ___ Walk with assistance    ___ Shower with assistance ___ No alcohol     ___ Return to work/school ________  Special Instructions: No driving smoking or alcohol  Continue vancomycin through 12/19/2020 per infectious disease  Follow-up with PCP on medical management of diabetes mellitus.  COMMUNITY REFERRALS UPON DISCHARGE:    Home Health:   PT & OT -[facility]   PHONE: [phone]                  RN-HELMS [phone] WILL COME POUT AT 5:00 PM 8/30 TO EDUCATE ON IV ANTIBIOTIC ADMINISTRATION  Medical Equipment/Items Ordered:ROLLATOR Levan Hurst, 3 IN 1 AND TUB BENCH                                                 Agency/Supplier:[facility]  [phone]     My questions have been answered and I understand these instructions. I will adhere to these goals and the provided educational materials after my discharge from the hospital.  Patient/Caregiver Signature _______________________________ Date __________  Clinician Signature _______________________________________ Date __________  Please bring this form and your medication list with you to all your follow-up doctor's appointments.

## 2020-11-11 NOTE — Plan of Care (Signed)
  Problem: Education: Goal: Knowledge of General Education information will improve Description: Including pain rating scale, medication(s)/side effects and non-pharmacologic comfort measures Outcome: Progressing   Problem: Coping: Goal: Level of anxiety will decrease Outcome: Progressing   

## 2020-11-11 NOTE — Progress Notes (Addendum)
Inpatient Rehabilitation Admissions Coordinator   I met with patient and her fiance at bedside. They state they are in agreement to CIR admit prior to d/c home. I have insurance approval. I will verify bed availability at 0930 huddle meeting and medical readiness with acute MD.  Danne Baxter, RN, MSN Rehab Admissions Coordinator [phone] 11/11/2020 8:28 AM  I have CIR bed to admit patient to today. I have alerted acute team and TOC and will make the arrangements to admit.  Danne Baxter, RN, MSN Rehab Admissions Coordinator [phone] 11/11/2020 12:07 PM

## 2020-11-11 NOTE — PMR Pre-admission (Signed)
PMR Admission Coordinator Pre-Admission Assessment  Patient: Chelsea Mathis is an 30 y.o., female MRN: [id number] DOB: [birth year] Height: 5' 4" (162.6 cm) Weight: 103.5 kg  Insurance Information  PRIMARY: [place] Medicaid [employer]      Policy#: [id number]      Subscriber: pt CM Name: approved via fax from [place]      Phone#: [phone]     Fax#: [phone] Pre-Cert#: [id number] approved until 8/19      Employer:  Benefits:  Phone #: [phone]     Name: 8/11 Eff. Date: active7/04/2020     Deduct: none      Out of Pocket Max: none      Life Max: none CIR: per Medicaid guidelines      SECONDARY:   none  Financial Counselor:       Phone#:   The Therapist, art Information Summary" for patients in Inpatient Rehabilitation Facilities with attached "Privacy Act [place] Records" was provided and verbally reviewed with: N/A  Emergency Contact Information Contact Information     Name Relation Home Work Mobile   [place] Significant other   [place] Mother   [phone]   [place], [place]   [phone]       Current Medical History  Patient Admitting Diagnosis: debility due to sepsis  History of Present Illness:  30 year old right-handed female with history of uncontrolled diabetes mellitus as well as recent UTI.   Presented 10/31/2020 after patient reportedly was lifting a box much heavier than she thought it was noting right groin pain that increased and wraparound to her hip and low back.  She was seen by urgent care on 10/01/2020 for UTI given nitrofurantoin for 5 days.  On 7/17 she was seen again at urgent care felt to have UTI with hematuria and a muscle spasm in her right hip.  She was given a Toradol injection and a prescription for Augmentin x5 days.  On 7/18 her vaginal panel came back positive for bacterial vaginal infection and was given a prescription for Flagyl.  She again returned to the ER on 7/24 with right thigh pain of unclear  etiology.  X-rays reportedly were completed negative as well as right lower extremity venous Doppler studies negative.  She was given a short course of pain medication(Norco) recommended follow-up with sports medicine.  She returned to the ER again 8/1 with reports of elevated blood sugars swelling to her hands and lower extremities and generalized pain.  She had followed at home prior to this latest arrival and reported generalized weakness.  CT of the right femur and abdomen/pelvis assessed showing low level edema tracking along the superficial fascia of the thigh and some mild infiltrative edema along the deep fascia planes in the thigh.  CT of abdomen pelvis revealed multiple cavitary lesions within the right lower lobe.  Labs noted severe hyponatremia 103 potassium 4.5 chloride 75 glucose 559 BUN 34 creatinine 1.01 alkaline phosphatase 203 albumin 2.1 indirect bilirubin 3.7 WBC 24,900 platelets 175,000 lactic acid 4.6.  She was placed on intravenous fluids and insulin drip in the ER.  Cultures returned showing staph aureus bacteremia/sepsis.  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  Repeat blood cultures 11/07/2020 negative.  Patient was placed on vancomycin x6 weeks through 12/19/2020.  Her acute pulmonary emboli felt to be secondary to septic pulmonary emboli initially placed on heparin later discontinued as she developed  anemia as noted lower extremity Dopplers negative pulmonary service was involved no recommendations for ongoing anticoagulation at this time.  Acute on chronic anemia she was transfused 1 unit packed red blood cells with latest hemoglobin 7.5 and leukocytosis 19,400 improved from 23,800.  She did develop paroxysmal atrial fibrillation requiring amiodarone cardiac rate controlled amiodarone discontinued.  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  Her initial CK was 586 improved to 177.  Psychiatry was consulted for patient with mixed anxiety depression during hospital stay likely secondary to acute illness and Atarax was added as needed for anxiety.   Patient's medical record from [facility] has been reviewed by the rehabilitation admission coordinator and physician.  Past Medical History  Past Medical History:  Diagnosis Date   Diabetes mellitus ([place])    Gestational diabetes    Family History   family history includes Diabetes in her brother, maternal uncle, and mother; Hypertension in her mother; Leukemia in her paternal grandfather and paternal grandmother; Stroke in her maternal grandmother.  Prior Rehab/Hospitalizations Has the patient had prior rehab or hospitalizations prior to admission? Yes  Has the patient had major surgery during 100 days prior to admission? No   Current Medications  Current Facility-Administered Medications:    acetaminophen (TYLENOL) tablet 650 mg, 650 mg, Oral, Q6H PRN, Buford Dresser, MD, 650 mg at 11/11/20 0640   Chlorhexidine Gluconate Cloth 2 % PADS 6 each, 6 each, Topical, Daily, Buford Dresser, MD, 6 each at 11/11/20 0957   dextrose 50 % solution 0-50 mL, 0-50 mL, Intravenous, PRN, Buford Dresser, MD   feeding supplement (ENSURE ENLIVE / ENSURE PLUS) liquid 237 mL, 237 mL, Oral, BID BM, Pokhrel, Laxman, MD, 237 mL at 11/11/20 5701   HYDROmorphone (DILAUDID) injection 0.5 mg, 0.5 mg, Intravenous, Q4H PRN, Mariel Aloe, MD, 0.5 mg at 11/11/20 1131   hydrOXYzine (ATARAX/VISTARIL) tablet 25 mg, 25 mg, Oral, BID PRN, Rosezetta Schlatter, MD, 25 mg at 11/11/20 7793   hydrOXYzine (ATARAX/VISTARIL) tablet 50 mg, 50 mg, Oral, QHS PRN, Rosezetta Schlatter, MD, 50 mg at 11/10/20 2155   insulin aspart (novoLOG) injection 0-15 Units, 0-15  Units, Subcutaneous, TID WC, Buford Dresser, MD, 2 Units at 11/10/20 1243   insulin aspart (novoLOG) injection 8 Units, 8 Units, Subcutaneous, TID WC, Pokhrel, Laxman, MD   insulin glargine-yfgn (SEMGLEE) injection 32 Units, 32 Units, Subcutaneous, Daily, Pokhrel, Laxman, MD, 32 Units at 11/11/20 0957   insulin starter kit- pen needles (English) 1 kit, 1 kit, Other, Once, Pokhrel, Laxman, MD   living well with diabetes book MISC, , Does not apply, Once, Pokhrel, Laxman, MD   methocarbamol (ROBAXIN) tablet 500 mg, 500 mg, Oral, Q8H PRN, Mariel Aloe, MD, 500 mg at 11/11/20 0341   multivitamin with minerals tablet 1 tablet, 1 tablet, Oral, Daily, Buford Dresser, MD, 1 tablet at 11/11/20 9030   oxyCODONE-acetaminophen (PERCOCET/ROXICET) 5-325 MG per tablet 1-2 tablet, 1-2 tablet, Oral, Q4H PRN, Buford Dresser, MD, 2 tablet at 11/11/20 0923   polyethylene glycol (MIRALAX / GLYCOLAX) packet 17 g, 17 g, Oral, BID, Mariel Aloe, MD, 17 g at 11/11/20 3007   senna-docusate (Senokot-S) tablet 1 tablet, 1 tablet, Oral, BID, Mariel Aloe, MD, 1 tablet at 11/11/20 6226   sodium chloride flush (NS) 0.9 % injection 10-40 mL, 10-40 mL, Intracatheter, Q12H, Pokhrel, Laxman, MD, 10 mL at 11/10/20 2149   sodium chloride flush (NS) 0.9 % injection 10-40  mL, 10-40 mL, Intracatheter, PRN, Pokhrel, Laxman, MD   vancomycin (VANCOREADY) IVPB 1750 mg/350 mL, 1,750 mg, Intravenous, Q12H, Mariel Aloe, MD, Last Rate: 175 mL/hr at 11/11/20 0931, 1,750 mg at 11/11/20 0931  Patients Current Diet:  Diet Order             Diet - low sodium heart healthy           Diet Carb Modified           Diet heart healthy/carb modified Room service appropriate? Yes; Fluid consistency: Thin  Diet effective now                  Precautions / Restrictions Precautions Precautions: Fall Restrictions Weight Bearing Restrictions: No   Has the patient had 2 or more falls or a fall with injury in  the past year? No  Prior Activity Level Community (5-7x/wk): Independent, driving  Prior Functional Level Self Care: Did the patient need help bathing, dressing, using the toilet or eating? Independent  Indoor Mobility: Did the patient need assistance with walking from room to room (with or without device)? Independent  Stairs: Did the patient need assistance with internal or external stairs (with or without device)? Independent  Functional Cognition: Did the patient need help planning regular tasks such as shopping or remembering to take medications? Independent  Home Assistive Devices / Equipment Home Equipment: Crutches  Prior Device Use: Indicate devices/aids used by the patient prior to current illness, exacerbation or injury? None of the above  Current Functional Level Cognition  Overall Cognitive Status: Within Functional Limits for tasks assessed Orientation Level: Oriented X4    Extremity Assessment (includes Sensation/Coordination)  Upper Extremity Assessment: Defer to OT evaluation  Lower Extremity Assessment: Generalized weakness    ADLs  Overall ADL's : Needs assistance/impaired Eating/Feeding: Independent Grooming: Set up, Sitting Grooming Details (indicate cue type and reason): 2/3 grooming tasks in perched position on stedy at sink level. Lower Body Dressing: Maximal assistance, +2 for physical assistance, +2 for safety/equipment Lower Body Dressing Details (indicate cue type and reason): Patient reports inability to cross LLE over R knee to adjust sock. Max A to adjust footwear bilaterally. Toilet Transfer: Total assistance Toilet Transfer Details (indicate cue type and reason): NT reports transfer to [facility] with use of Stedy. Patient requires assist for hygiene/clothing management. General ADL Comments: Mod to Max A grossly.    Mobility  Overal bed mobility: Needs Assistance Bed Mobility: Supine to Sit, Sit to Supine Supine to sit: Mod assist Sit to supine:  Mod assist, +2 for physical assistance General bed mobility comments: Seated in recliner upon entry.    Transfers  Overall transfer level: Needs assistance Equipment used: Rolling walker (2 wheeled) Transfer via Lift Equipment: Stedy Transfers: Sit to/from Stand Sit to Stand: Mod assist, +2 physical assistance, +2 safety/equipment Stand pivot transfers: Min assist, +2 physical assistance, From elevated surface General transfer comment: Declined sit to stand transfers from recliner secondary to pain in bilateral feet and R groin.    Ambulation / Gait / Stairs / Wheelchair Mobility  Ambulation/Gait Ambulation/Gait assistance: Min assist, +2 safety/equipment Gait Distance (Feet): 3 Feet Assistive device: Rolling walker (2 wheeled) Gait Pattern/deviations: Decreased stride length, Shuffle General Gait Details: Side steps toward HOB with RW and min A of 2 to steady and assist with walker. Gait velocity: decreased    Posture / Balance Dynamic Sitting Balance Sitting balance - Comments: Maintains unsupported sitting position in recliner during BUE HEP. Balance Overall balance  assessment: Needs assistance Sitting-balance support: Feet supported Sitting balance-Leahy Scale: Good Sitting balance - Comments: Maintains unsupported sitting position in recliner during BUE HEP. Standing balance support: Bilateral upper extremity supported, During functional activity Standing balance-Leahy Scale: Poor Standing balance comment: Requiring bil UE support    Special needs/care consideration IV Vanc with LOT 6 weeks. Has midline now but will need PICC for discharge Hgb A1c 12.3   Previous Home Environment  Living Arrangements: Spouse/significant other  Lives With: Spouse (59 and 7 year old children) Available Help at Discharge: Family, Available 24 hours/day Type of Home: House Home Layout: Bed/bath upstairs, 1/2 bath on main level Alternate Level Stairs-Rails: Right Alternate Level  Stairs-Number of Steps: 16 Home Access: Stairs to enter Entrance Stairs-Rails: None Entrance Stairs-Number of Steps: 1-2 Bathroom Shower/Tub: Chiropodist: Standard Bathroom Accessibility: Yes How Accessible: Accessible via walker [place]: No  Discharge Living Setting Plans for Discharge Living Setting: Patient's home, Lives with (comment) (fiance and 2 children) Type of Home at Discharge: House Discharge Home Layout: Two level, 1/2 bath on main level, Bed/bath upstairs Alternate Level Stairs-Rails: Right Alternate Level Stairs-Number of Steps: 16 Discharge Home Access: Stairs to enter Entrance Stairs-Rails: None Entrance Stairs-Number of Steps: 1 to 2 Discharge Bathroom Shower/Tub: Tub/shower unit Discharge Bathroom Toilet: Standard Discharge Bathroom Accessibility: Yes How Accessible: Accessible via walker Does the patient have any problems obtaining your medications?: No  Social/Family/Support Systems Patient Roles: Spouse, Parent Contact Information: [name], fiance Anticipated Caregiver: [name] and her family Anticipated Caregiver's Contact Information: see above Ability/Limitations of Caregiver: none Caregiver Availability: 24/7 Discharge Plan Discussed with Primary Caregiver: Yes Is Caregiver In Agreement with Plan?: Yes Does Caregiver/Family have Issues with Lodging/Transportation while Pt is in Rehab?: No  Goals Patient/Family Goal for Rehab: Mod I to supervision with PT and OT Expected length of stay: ELOS 7 to 10 days Pt/Family Agrees to Admission and willing to participate: Yes Program Orientation Provided & Reviewed with Pt/Caregiver Including Roles  & Responsibilities: Yes  Decrease burden of Care through IP rehab admission: n/a  Possible need for SNF placement upon discharge: not antiicapted  Patient Condition: I have reviewed medical records from [place], spoken with patient and spouse. I met with patient at the  bedside for inpatient rehabilitation assessment.  Patient will benefit from ongoing PT and OT, can actively participate in 3 hours of therapy a day 5 days of the week, and can make measurable gains during the admission.  Patient will also benefit from the coordinated team approach during an Inpatient Acute Rehabilitation admission.  The patient will receive intensive therapy as well as Rehabilitation physician, nursing, social worker, and care management interventions.  Due to bladder management, bowel management, safety, skin/wound care, disease management, medication administration, pain management, and patient education the patient requires 24 hour a day rehabilitation nursing.  The patient is currently mod assist overall with mobility and basic ADLs.  Discharge setting and therapy post discharge at home with home health is anticipated.  Patient has agreed to participate in the Acute Inpatient Rehabilitation Program and will admit today.  Preadmission Screen Completed By:  Cleatrice Burke, 11/11/2020 12:08 PM ______________________________________________________________________   Discussed status with Dr. Naaman Plummer on  11/11/2020 at  1209 and received approval for admission today.  Admission Coordinator:  Cleatrice Burke, RN, time 1209 Date 11/11/2020   Assessment/Plan: Diagnosis: debility related to sepsis Does the need for close, 24 hr/day Medical supervision in concert with the patient's rehab needs make  it unreasonable for this patient to be served in a less intensive setting? Yes Co-Morbidities requiring supervision/potential complications: endocarditis, UTI, DM, myositis Due to bladder management, bowel management, safety, skin/wound care, disease management, medication administration, pain management, and patient education, does the patient require 24 hr/day rehab nursing? Yes Does the patient require coordinated care of a physician, rehab nurse, PT, OT, and SLP to address physical  and functional deficits in the context of the above medical diagnosis(es)? Yes Addressing deficits in the following areas: balance, endurance, locomotion, strength, transferring, bowel/bladder control, bathing, dressing, feeding, grooming, toileting, and psychosocial support Can the patient actively participate in an intensive therapy program of at least 3 hrs of therapy 5 days a week? Yes The potential for patient to make measurable gains while on inpatient rehab is excellent Anticipated functional outcomes upon discharge from inpatient rehab: modified independent and supervision PT, modified independent and supervision OT, n/a SLP Estimated rehab length of stay to reach the above functional goals is: 7-10 days Anticipated discharge destination: Home 10. Overall Rehab/Functional Prognosis: excellent   MD Signature: Meredith Staggers, MD, [facility] 11/11/2020

## 2020-11-11 NOTE — Consult Note (Signed)
[facility] Psych Consult Progress Note  11/11/2020 10:08 AM Chelsea Mathis  MRN:  [id number] Reason for Consult:  depression and anxiety Referring Physician:  Flora Lipps, MD Method of visit?: Face to Face     Principal Problem: Severe sepsis ([place]) Diagnosis:  Principal Problem:   Severe sepsis ([place]) Active Problems:   DKA (diabetic ketoacidosis) ([place])   Bacteremia due to Staphylococcus aureus   Septic pulmonary embolism ([place])   Type 2 diabetes mellitus with hypoglycemia without coma ([place])   Myositis   Myofasciitis   Adjustment disorder with mixed anxiety and depressed mood  Subjective: Chelsea Mathis is a 30 year old female with no psychiatric history and a medical history of uncontrolled T2DM, 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 This morning, she states that she feels better. The Hydroxyzine helped her to sleep last night, but she awoke having what "felt like a panic attack" due to pain. She was advised that when she has those thoughts, she can just ask for the PRN Hydroxyzine, to which she was agreeable. She denies SI/HI/AVH. She stated that the chaplain has not yet spoken with her but she looks forward to speaking with them. She denies the need for anything else from psychiatry at this time.   Total Time spent with patient: 15 minutes  Past Psychiatric History: None  Past Medical History:  Past Medical History:  Diagnosis Date   Diabetes mellitus ([place])    Gestational diabetes     Past Surgical History:  Procedure Laterality Date   LAPAROSCOPIC APPENDECTOMY N/A 11/11/2016   Procedure: APPENDECTOMY LAPAROSCOPIC;  Surgeon: Georganna Skeans, MD;  Location: [facility];  Service: General;  Laterality: N/A;   TEE WITHOUT CARDIOVERSION N/A 11/03/2020   Procedure: TRANSESOPHAGEAL ECHOCARDIOGRAM (TEE);  Surgeon: Buford Dresser, MD;  Location: [facility] ENDOSCOPY;  Service: Cardiovascular;  Laterality: N/A;   Family  History:  Family History  Problem Relation Age of Onset   Diabetes Mother    Hypertension Mother    Diabetes Maternal Uncle    Diabetes Brother    Stroke Maternal Grandmother    Leukemia Paternal Grandmother    Leukemia Paternal Grandfather    Family Psychiatric  History: Unknown Social History:  Social History   Substance and Sexual Activity  Alcohol Use No   Alcohol/week: 0.0 standard drinks     Social History   Substance and Sexual Activity  Drug Use No    Social History   Socioeconomic History   Marital status: Single    Spouse name: Not on file   Number of children: Not on file   Years of education: Not on file   Highest education level: Not on file  Occupational History   Not on file  Tobacco Use   Smoking status: Never   Smokeless tobacco: Never  Substance and Sexual Activity   Alcohol use: No    Alcohol/week: 0.0 standard drinks   Drug use: No   Sexual activity: Yes  Other Topics Concern   Not on file  Social History Narrative   Not on file   Social Determinants of Health   Financial Resource Strain: Not on file  Food Insecurity: Not on file  Transportation Needs: Not on file  Physical Activity: Not on file  Stress: Not on file  Social Connections: Not on file    Sleep: Fair  Appetite:  Fair  Current Medications: Current Facility-Administered Medications  Medication Dose Route Frequency Provider Last Rate Last Admin  acetaminophen (TYLENOL) tablet 650 mg  650 mg Oral Q6H PRN Buford Dresser, MD   650 mg at 11/11/20 0640   bisacodyl (DULCOLAX) suppository 10 mg  10 mg Rectal Once Mariel Aloe, MD       Chlorhexidine Gluconate Cloth 2 % PADS 6 each  6 each Topical Daily Buford Dresser, MD   6 each at 11/11/20 0957   dextrose 50 % solution 0-50 mL  0-50 mL Intravenous PRN Buford Dresser, MD       feeding supplement (ENSURE ENLIVE / ENSURE PLUS) liquid 237 mL  237 mL Oral BID BM Pokhrel, Laxman, MD   237 mL at 11/11/20  6950   HYDROmorphone (DILAUDID) injection 0.5 mg  0.5 mg Intravenous Q4H PRN Mariel Aloe, MD   0.5 mg at 11/11/20 7225   hydrOXYzine (ATARAX/VISTARIL) tablet 25 mg  25 mg Oral BID PRN Rosezetta Schlatter, MD   25 mg at 11/11/20 7505   hydrOXYzine (ATARAX/VISTARIL) tablet 50 mg  50 mg Oral QHS PRN Rosezetta Schlatter, MD   50 mg at 11/10/20 2155   insulin aspart (novoLOG) injection 0-15 Units  0-15 Units Subcutaneous TID WC Buford Dresser, MD   2 Units at 11/10/20 1243   insulin aspart (novoLOG) injection 8 Units  8 Units Subcutaneous TID WC Pokhrel, Laxman, MD       insulin glargine-yfgn (SEMGLEE) injection 32 Units  32 Units Subcutaneous Daily Pokhrel, Laxman, MD   32 Units at 11/11/20 0957   insulin starter kit- pen needles (English) 1 kit  1 kit Other Once Buford Dresser, MD       living well with diabetes book MISC   Does not apply Once Buford Dresser, MD       methocarbamol (ROBAXIN) tablet 500 mg  500 mg Oral Q8H PRN Mariel Aloe, MD   500 mg at 11/11/20 0341   multivitamin with minerals tablet 1 tablet  1 tablet Oral Daily Buford Dresser, MD   1 tablet at 11/11/20 1833   oxyCODONE-acetaminophen (PERCOCET/ROXICET) 5-325 MG per tablet 1-2 tablet  1-2 tablet Oral Q4H PRN Buford Dresser, MD   2 tablet at 11/11/20 5825   polyethylene glycol (MIRALAX / GLYCOLAX) packet 17 g  17 g Oral BID Mariel Aloe, MD   17 g at 11/11/20 1898   senna-docusate (Senokot-S) tablet 1 tablet  1 tablet Oral BID Mariel Aloe, MD   1 tablet at 11/11/20 4210   sodium chloride flush (NS) 0.9 % injection 10-40 mL  10-40 mL Intracatheter Q12H Pokhrel, Laxman, MD   10 mL at 11/10/20 2149   sodium chloride flush (NS) 0.9 % injection 10-40 mL  10-40 mL Intracatheter PRN Pokhrel, Laxman, MD       vancomycin (VANCOREADY) IVPB 1750 mg/350 mL  1,750 mg Intravenous Q12H Mariel Aloe, MD 175 mL/hr at 11/11/20 0931 1,750 mg at 11/11/20 3128    Lab Results:  Results for orders placed  or performed during the hospital encounter of 10/31/20 (from the past 48 hour(s))  Glucose, capillary     Status: Abnormal   Collection Time: 11/09/20 11:05 AM  Result Value Ref Range   Glucose-Capillary 128 (H) 70 - 99 mg/dL    Comment: Glucose reference range applies only to samples taken after fasting for at least 8 hours.  Glucose, capillary     Status: Abnormal   Collection Time: 11/09/20  8:17 PM  Result Value Ref Range   Glucose-Capillary 104 (H) 70 - 99 mg/dL  Comment: Glucose reference range applies only to samples taken after fasting for at least 8 hours.  Comprehensive metabolic panel     Status: Abnormal   Collection Time: 11/10/20  5:14 AM  Result Value Ref Range   Sodium 132 (L) 135 - 145 mmol/L   Potassium 3.8 3.5 - 5.1 mmol/L   Chloride 98 98 - 111 mmol/L   CO2 28 22 - 32 mmol/L   Glucose, Bld 127 (H) 70 - 99 mg/dL    Comment: Glucose reference range applies only to samples taken after fasting for at least 8 hours.   BUN <5 (L) 6 - 20 mg/dL   Creatinine, Ser 0.39 (L) 0.44 - 1.00 mg/dL   Calcium 7.7 (L) 8.9 - 10.3 mg/dL   Total Protein 6.6 6.5 - 8.1 g/dL   Albumin 1.4 (L) 3.5 - 5.0 g/dL   AST 23 15 - 41 U/L   ALT 11 0 - 44 U/L   Alkaline Phosphatase 85 38 - 126 U/L   Total Bilirubin 0.5 0.3 - 1.2 mg/dL   GFR, Estimated >60 >60 mL/min    Comment: (NOTE) Calculated using the CKD-EPI Creatinine Equation (2021)    Anion gap 6 5 - 15    Comment: Performed at [facility], [place] [address], [place], [place] [address]  CBC     Status: Abnormal   Collection Time: 11/10/20  5:14 AM  Result Value Ref Range   WBC 19.4 (H) 4.0 - 10.5 K/uL   RBC 2.74 (L) 3.87 - 5.11 MIL/uL   Hemoglobin 7.5 (L) 12.0 - 15.0 g/dL   HCT 22.8 (L) 36.0 - 46.0 %   MCV 83.2 80.0 - 100.0 fL   MCH 27.4 26.0 - 34.0 pg   MCHC 32.9 30.0 - 36.0 g/dL   RDW 17.3 (H) 11.5 - 15.5 %   Platelets 467 (H) 150 - 400 K/uL   nRBC 0.3 (H) 0.0 - 0.2 %    Comment: Performed at [facility] [address], [place], [place] [address]  Magnesium     Status: None   Collection Time: 11/10/20  5:14 AM  Result Value Ref Range   Magnesium 2.0 1.7 - 2.4 mg/dL    Comment: Performed at [facility] [address], [place], [place] [address]  Glucose, capillary     Status: None   Collection Time: 11/10/20  7:54 AM  Result Value Ref Range   Glucose-Capillary 92 70 - 99 mg/dL    Comment: Glucose reference range applies only to samples taken after fasting for at least 8 hours.  Glucose, capillary     Status: Abnormal   Collection Time: 11/10/20 11:30 AM  Result Value Ref Range   Glucose-Capillary 154 (H) 70 - 99 mg/dL    Comment: Glucose reference range applies only to samples taken after fasting for at least 8 hours.  Vancomycin, peak     Status: None   Collection Time: 11/10/20  2:24 PM  Result Value Ref Range   Vancomycin Pk 32 30 - 40 ug/mL    Comment: Performed at [facility], [place] [address], [place], [place] [address]  Glucose, capillary     Status: Abnormal   Collection Time: 11/10/20  4:13 PM  Result Value Ref Range   Glucose-Capillary 57 (L) 70 - 99 mg/dL    Comment: Glucose reference range applies only to samples taken after fasting for at least 8 hours.  Glucose, capillary  Status: None   Collection Time: 11/10/20  4:30 PM  Result Value Ref Range   Glucose-Capillary 74 70 - 99 mg/dL    Comment: Glucose reference range applies only to samples taken after fasting for at least 8 hours.  Glucose, capillary     Status: Abnormal   Collection Time: 11/10/20  7:45 PM  Result Value Ref Range   Glucose-Capillary 195 (H) 70 - 99 mg/dL    Comment: Glucose reference range applies only to samples taken after fasting for at least 8 hours.  Vancomycin, trough     Status: Abnormal   Collection Time: 11/10/20  9:53 PM  Result Value Ref Range   Vancomycin Tr 11 (L) 15 - 20 ug/mL    Comment: Performed at [facility], [place] [address] [place], [place] [address]   Glucose, capillary     Status: None   Collection Time: 11/11/20  7:45 AM  Result Value Ref Range   Glucose-Capillary 95 70 - 99 mg/dL    Comment: Glucose reference range applies only to samples taken after fasting for at least 8 hours.    Blood Alcohol level:  No results found for: [facility]  Physical Findings:  Musculoskeletal: Strength & Muscle Tone: within normal limits Gait & Station:  Unobserved Patient leans: N/A  Psychiatric Specialty Exam:  Presentation  General Appearance: Appropriate for Environment; Well Groomed  Eye Contact:Good  Speech:Normal Rate  Speech Volume:Normal  Handedness: No data recorded  Mood and Affect  Mood:Euthymic  Affect:Congruent   Thought Process  Thought Processes:Coherent; Linear  Descriptions of Associations:Intact  Orientation:Full (Time, Place and Person)  Thought Content:Logical; WDL  History of Schizophrenia/Schizoaffective disorder:No data recorded Duration of Psychotic Symptoms:No data recorded Hallucinations:Hallucinations: None  Ideas of Reference:None  Suicidal Thoughts:Suicidal Thoughts: No  Homicidal Thoughts:Homicidal Thoughts: No   Sensorium  Memory:Immediate Good; Recent Good; Remote Good  Judgment:Good  Insight:Good   Executive Functions  Concentration:Good  Attention Span:Good  [place] of Knowledge:Good  Language:Good   Psychomotor Activity  Psychomotor Activity:Psychomotor Activity: Normal   Assets  Assets:Communication Skills; Desire for Improvement; Financial Resources/Insurance; Housing; Intimacy; Resilience; Social Support   Sleep  Sleep:Sleep: Fair Number of Hours of Sleep: 3    Physical Exam: Physical Exam Vitals reviewed.  HENT:     Head: Normocephalic and atraumatic.  Eyes:     Extraocular Movements: Extraocular movements intact.  Cardiovascular:     Rate and Rhythm: Tachycardia present.  Pulmonary:     Effort: Pulmonary effort is normal.   Musculoskeletal:        General: Normal range of motion.     Cervical back: Normal range of motion.  Neurological:     General: No focal deficit present.     Mental Status: She is alert and oriented to person, place, and time.  Psychiatric:        Mood and Affect: Mood normal.        Behavior: Behavior normal.        Thought Content: Thought content normal.        Judgment: Judgment normal.   Review of Systems  Psychiatric/Behavioral:  Negative for depression, hallucinations, memory loss, substance abuse and suicidal ideas. The patient is nervous/anxious. The patient does not have insomnia.   All other systems reviewed and are negative. Blood pressure 114/63, pulse (!) 111, temperature (!) 100.5 F (38.1 C), temperature source Oral, resp. rate 20, height _0  (1.626 m), weight 103.5 kg, last menstrual period 10/31/2020, SpO2 99 %, unknown  if currently breastfeeding. Body mass index is 39.17 kg/m.  Treatment Plan Summary: Chelsea Mathis is a 30 year old female with no psychiatric history consulted to the psychiatry service for depression and anxiety 2/2 feeling overwhelmed by her MRSA bacteremia and TV endocarditis diagnoses. She endorsed improvement in sleep and overall mood; as well she still does not meet inpatient psychiatry criteria.   Adjustment disorder w/ mixed depression and anxiety Insomnia -Consult placed by primary team to have [place] speak with the chaplain.  -Continue Hydroxyzine 25 mg BID PRN anxiety and 50 mg qHS PRN insomnia      Disposition: No evidence of imminent risk to self or others at present.    Patient is determined to be psychiatrically stable at this time. Psychiatry will sign off. Please do not hesitate to call back if questions arise. Thank you for this consult.    Rosezetta Schlatter, MD 11/11/2020, 10:08 AM

## 2020-11-11 NOTE — H&P (Signed)
Physical Medicine and Rehabilitation Admission H&P    Chief Complaint  Patient presents with   Hyperglycemia   generalized pain  : HPI: Chelsea Mathis is a 30 year old right-handed female with history of uncontrolled diabetes mellitus as well as recent UTI.  Per chart review patient lives with significant other.  Reportedly independent prior to admission working for [employer].  Two-level home with 1-2 steps to entry.  Presented 10/31/2020 after patient reportedly was lifting a box much heavier than she thought it was noting right groin pain that increased and wraparound to her hip and low back.  She was seen by urgent care on 10/01/2020 for UTI given nitrofurantoin for 5 days.  On 7/17 she was seen again at urgent care felt to have UTI with hematuria and a muscle spasm in her right hip.  She was given a Toradol injection and a prescription for Augmentin x5 days.  On 7/18 her vaginal panel came back positive for bacterial vaginal infection and was given a prescription for Flagyl.  She again returned to the ER on 7/24 with right thigh pain of unclear etiology.  X-rays reportedly were completed negative as well as right lower extremity venous Doppler studies negative.  She was given a short course of pain medication(Norco) recommended follow-up with sports medicine.  She returned to the ER again 8/1 with reports of elevated blood sugars swelling to her hands and lower extremities and generalized pain.  She had followed at home prior to this latest arrival and reported generalized weakness.  CT of the right femur and abdomen/pelvis assessed showing low level edema tracking along the superficial fascia of the thigh and some mild infiltrative edema along the deep fascia planes in the thigh.  CT of abdomen pelvis revealed multiple cavitary lesions within the right lower lobe.  Labs noted severe hyponatremia 103 potassium 4.5 chloride 75 glucose 559 BUN 34 creatinine 1.01 alkaline phosphatase 203 albumin 2.1  indirect bilirubin 3.7 WBC 24,900 platelets 175,000 lactic acid 4.6.  She was placed on intravenous fluids and insulin drip in the ER.  Cultures returned showing staph aureus bacteremia/sepsis.  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  Repeat blood cultures 11/07/2020 negative.  Patient was placed on vancomycin x6 weeks through 12/19/2020.  Her acute pulmonary emboli felt to be secondary to septic pulmonary emboli initially placed on heparin later discontinued as she developed anemia as noted lower extremity Dopplers negative pulmonary service was involved no recommendations for ongoing anticoagulation at this time.  Acute on chronic anemia she was transfused 1 unit packed red blood cells with latest hemoglobin 7.5 and leukocytosis 19,400 improved from 23,800.  She did develop paroxysmal atrial fibrillation requiring amiodarone cardiac rate controlled amiodarone discontinued.  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  Her initial CK was 586 improved to 177.  Psychiatry was consulted for patient with mixed anxiety depression during hospital stay likely secondary to acute illness and Atarax was added as needed for anxiety.  Therapy evaluations completed due to patient decreased functional mobility was admitted for a comprehensive rehab program  Review of Systems  Constitutional:  Positive for fever. Negative for chills.  HENT:  Negative for hearing loss.   Eyes:  Negative for blurred vision and double vision.  Respiratory:  Negative for cough and shortness of breath.  Cardiovascular:  Positive for leg swelling. Negative for chest pain and palpitations.  Gastrointestinal:  Positive for constipation. Negative for heartburn, nausea  and vomiting.  Genitourinary:  Negative for dysuria, flank pain and hematuria.  Musculoskeletal:  Positive for back pain, falls, joint pain and myalgias.  Skin:  Negative for rash.  Neurological:  Positive for weakness.  All other systems reviewed and are negative. Past Medical History:  Diagnosis Date   Diabetes mellitus (HCC)    Gestational diabetes    Past Surgical History:  Procedure Laterality Date   LAPAROSCOPIC APPENDECTOMY N/A 11/11/2016   Procedure: APPENDECTOMY LAPAROSCOPIC;  Surgeon: Violeta Gelinas, MD;  Location: [facility];  Service: General;  Laterality: N/A;   TEE WITHOUT CARDIOVERSION N/A 11/03/2020   Procedure: TRANSESOPHAGEAL ECHOCARDIOGRAM (TEE);  Surgeon: Jodelle Red, MD;  Location: [facility] ENDOSCOPY;  Service: Cardiovascular;  Laterality: N/A;   Family History  Problem Relation Age of Onset   Diabetes Mother    Hypertension Mother    Diabetes Maternal Uncle    Diabetes Brother    Stroke Maternal Grandmother    Leukemia Paternal Grandmother    Leukemia Paternal Grandfather    Social History:  reports that she has never smoked. She has never used smokeless tobacco. She reports that she does not drink alcohol and does not use drugs. Allergies: No Known Allergies Medications Prior to Admission  Medication Sig Dispense Refill   acetaminophen (TYLENOL) 500 MG tablet Take 1,000 mg by mouth every 6 (six) hours as needed for moderate pain or headache.     aspirin 325 MG tablet Take 1,300 mg by mouth every 6 (six) hours as needed for headache.     ibuprofen (ADVIL) 200 MG tablet Take 800 mg by mouth every 6 (six) hours as needed for headache or mild pain.     [EXPIRED] predniSONE (STERAPRED UNI-PAK 48 TAB) 10 MG (48) TBPK tablet Take 20-60 mg by mouth as directed. 60mg  four times a day for 4 days, 40mg  four times a day for 4 days, 20mg  twice a day for 4 days.     amoxicillin-clavulanate (AUGMENTIN) 500-125 MG tablet Take 1 tablet by mouth 2 (two) times daily. For 5 days      HYDROcodone-acetaminophen (NORCO/VICODIN) 5-325 MG tablet Take 1 tablet by mouth every 6 (six) hours as needed. (Patient not taking: No sig reported) 6 tablet 0   metroNIDAZOLE (FLAGYL) 500 MG tablet Take 500 mg by mouth 2 (two) times daily. For 7 days      Drug Regimen Review Drug regimen was reviewed and remains appropriate with no significant issues identified  Home: Home Living Family/patient expects to be discharged to:: Private residence Living Arrangements: Spouse/significant other, Children Available Help at Discharge: Family, Available 24 hours/day Type of Home: House Home Access: Stairs to enter of Steps: 1-2 Entrance Stairs-Rails: None Home Layout: Two level Alternate Level Stairs-Number of Steps: 16 Alternate Level Stairs-Rails: Right Bathroom Shower/Tub: : Standard Home Equipment: Crutches   Functional History: Prior Function Level of Independence: Independent Comments: Drivers for door dash  Functional Status:  Mobility: Bed Mobility Overal bed mobility: Needs Assistance Bed Mobility: Supine to Sit, Sit to Supine Supine to sit: Mod assist Sit to supine: Mod assist, +2 for physical assistance General bed mobility comments: Supine to sit: increased time but pt working legs to EOB with min A for R leg and then mod A to lift trunk.  Supine to sit: mod x 2 for pain control Transfers Overall transfer level:  Needs assistance Equipment used: Rolling walker (2 wheeled) Transfer via Lift Equipment: Stedy Transfers: Sit to/from Stand Sit to Stand: Mod assist, +2 physical assistance, +2 safety/equipment Stand pivot transfers: Min assist, +2 physical assistance, From elevated surface General transfer comment: Min A x 2 but from highly elevated bed with increased time to rise and cues for hand placement Ambulation/Gait Ambulation/Gait assistance: Min assist, +2 safety/equipment Gait Distance (Feet): 3  Feet Assistive device: Rolling walker (2 wheeled) Gait Pattern/deviations: Decreased stride length, Shuffle General Gait Details: Side steps toward HOB with RW and min A of 2 to steady and assist with walker. Gait velocity: decreased    ADL: ADL Overall ADL's : Needs assistance/impaired Eating/Feeding: Independent Grooming: Set up, Sitting Grooming Details (indicate cue type and reason): 2/3 grooming tasks in perched position on stedy at sink level. Lower Body Dressing: Bed level, Moderate assistance, +2 for physical assistance, +2 for safety/equipment Toilet Transfer: Total assistance Toilet Transfer Details (indicate cue type and reason): Stedy  Cognition: Cognition Overall Cognitive Status: Within Functional Limits for tasks assessed Orientation Level: Oriented X4 Cognition Arousal/Alertness: Awake/alert Behavior During Therapy: WFL for tasks assessed/performed Overall Cognitive Status: Within Functional Limits for tasks assessed  Physical Exam: Blood pressure 115/65, pulse (!) 112, temperature 99.8 F (37.7 C), temperature source Oral, resp. rate 20, height  (1.626 m), weight 103.5 kg, last menstrual period 10/31/2020, SpO2 99 %, unknown if currently breastfeeding. Physical Exam Constitutional:      Appearance: She is not ill-appearing.  HENT:     Head: Normocephalic and atraumatic.     Right Ear: External ear normal.     Left Ear: External ear normal.     Nose: Nose normal.  Eyes:     Extraocular Movements: Extraocular movements intact.     Pupils: Pupils are equal, round, and reactive to light.  Cardiovascular:     Rate and Rhythm: Regular rhythm. Tachycardia present.     Heart sounds: No murmur heard.   No gallop.  Pulmonary:     Effort: Pulmonary effort is normal. No respiratory distress.     Breath sounds: No wheezing.  Abdominal:     General: There is no distension.     Palpations: Abdomen is soft.     Tenderness: There is no abdominal tenderness.   Musculoskeletal:        General: Swelling and tenderness (bilateral thighs and knees, right greater than left) present.     Cervical back: Normal range of motion.  Skin:    General: Skin is warm and dry.  Neurological:     Mental Status: She is alert.     Comments: Patient is alert.  Mood is a bit flat but appropriate.  Oriented x3 and follows commands. Reasonable insight and awareness. No focal sensory deficits. UE 3+/5 prox to 4/5 distally. LE: 3/5 HF to 4/5 ADF/PF. DTR's 1+  Psychiatric:        Mood and Affect: Mood normal.        Behavior: Behavior normal.        Thought Content: Thought content normal.        Judgment: Judgment normal.    Results for orders placed or performed during the hospital encounter of 10/31/20 (from the past 48 hour(s))  Glucose, capillary     Status: None   Collection Time: 11/09/20  7:25 AM  Result Value Ref Range   Glucose-Capillary 89 70 - 99 mg/dL    Comment: Glucose reference range applies only to samples taken after  fasting for at least 8 hours.  Glucose, capillary     Status: Abnormal   Collection Time: 11/09/20 11:05 AM  Result Value Ref Range   Glucose-Capillary 128 (H) 70 - 99 mg/dL    Comment: Glucose reference range applies only to samples taken after fasting for at least 8 hours.  Glucose, capillary     Status: Abnormal   Collection Time: 11/09/20  8:17 PM  Result Value Ref Range   Glucose-Capillary 104 (H) 70 - 99 mg/dL    Comment: Glucose reference range applies only to samples taken after fasting for at least 8 hours.  Comprehensive metabolic panel     Status: Abnormal   Collection Time: 11/10/20  5:14 AM  Result Value Ref Range   Sodium 132 (L) 135 - 145 mmol/L   Potassium 3.8 3.5 - 5.1 mmol/L   Chloride 98 98 - 111 mmol/L   CO2 28 22 - 32 mmol/L   Glucose, Bld 127 (H) 70 - 99 mg/dL    Comment: Glucose reference range applies only to samples taken after fasting for at least 8 hours.   BUN <5 (L) 6 - 20 mg/dL   Creatinine, Ser  1.93 (L) 0.44 - 1.00 mg/dL   Calcium 7.7 (L) 8.9 - 10.3 mg/dL   Total Protein 6.6 6.5 - 8.1 g/dL   Albumin 1.4 (L) 3.5 - 5.0 g/dL   AST 23 15 - 41 U/L   ALT 11 0 - 44 U/L   Alkaline Phosphatase 85 38 - 126 U/L   Total Bilirubin 0.5 0.3 - 1.2 mg/dL   GFR, Estimated >79 >02 mL/min    Comment: (NOTE) Calculated using the CKD-EPI Creatinine Equation (2021)    Anion gap 6 5 - 15    Comment: Performed at [facility], [address], [place], [place] [address]  CBC     Status: Abnormal   Collection Time: 11/10/20  5:14 AM  Result Value Ref Range   WBC 19.4 (H) 4.0 - 10.5 K/uL   RBC 2.74 (L) 3.87 - 5.11 MIL/uL   Hemoglobin 7.5 (L) 12.0 - 15.0 g/dL   HCT 53.2 (L) 99.2 - 42.6 %   MCV 83.2 80.0 - 100.0 fL   MCH 27.4 26.0 - 34.0 pg   MCHC 32.9 30.0 - 36.0 g/dL   RDW 83.4 (H) 19.6 - 22.2 %   Platelets 467 (H) 150 - 400 K/uL   nRBC 0.3 (H) 0.0 - 0.2 %    Comment: Performed at [facility], [address], [place], [place] [address]  Magnesium     Status: None   Collection Time: 11/10/20  5:14 AM  Result Value Ref Range   Magnesium 2.0 1.7 - 2.4 mg/dL    Comment: Performed at [facility], [address], [place], [place] [address]  Glucose, capillary     Status: None   Collection Time: 11/10/20  7:54 AM  Result Value Ref Range   Glucose-Capillary 92 70 - 99 mg/dL    Comment: Glucose reference range applies only to samples taken after fasting for at least 8 hours.  Glucose, capillary     Status: Abnormal   Collection Time: 11/10/20 11:30 AM  Result Value Ref Range   Glucose-Capillary 154 (H) 70 - 99 mg/dL    Comment: Glucose reference range applies only to samples taken after fasting for at least 8 hours.  Vancomycin, peak     Status: None   Collection Time: 11/10/20  2:24 PM  Result Value Ref Range   Vancomycin Pk 32 30 - 40 ug/mL    Comment: Performed at [facility], [address], [place], [place] [address]  Glucose, capillary     Status: Abnormal    Collection Time: 11/10/20  4:13 PM  Result Value Ref Range   Glucose-Capillary 57 (L) 70 - 99 mg/dL    Comment: Glucose reference range applies only to samples taken after fasting for at least 8 hours.  Glucose, capillary     Status: None   Collection Time: 11/10/20  4:30 PM  Result Value Ref Range   Glucose-Capillary 74 70 - 99 mg/dL    Comment: Glucose reference range applies only to samples taken after fasting for at least 8 hours.  Glucose, capillary     Status: Abnormal   Collection Time: 11/10/20  7:45 PM  Result Value Ref Range   Glucose-Capillary 195 (H) 70 - 99 mg/dL    Comment: Glucose reference range applies only to samples taken after fasting for at least 8 hours.  Vancomycin, trough     Status: Abnormal   Collection Time: 11/10/20  9:53 PM  Result Value Ref Range   Vancomycin Tr 11 (L) 15 - 20 ug/mL    Comment: Performed at [facility], [address] [place], [place] [address]   VAS [place] UPPER EXTREMITY VENOUS DUPLEX  Result Date: 11/09/2020 UPPER VENOUS STUDY  Patient Name:  Chelsea Mathis  Date of Exam:   11/09/2020 Medical Rec #: [id number]       Accession #:    [id number] Date of Birth: May 29, 1990        Patient Gender: F Patient Age:   29 years Exam Location:  [facility] Procedure:      VAS [place] UPPER EXTREMITY VENOUS DUPLEX Referring Phys: Rebekah Chesterfield POKHREL --------------------------------------------------------------------------------  Indications: Pain, and Swelling Comparison Study: Previous exam 11/06/20 - negative Performing Technologist: Ernestene Mention RVT, RDMS  Examination Guidelines: A complete evaluation includes B-mode imaging, spectral Doppler, color Doppler, and power Doppler as needed of all accessible portions of each vessel. Bilateral testing is considered an integral part of a complete examination. Limited examinations for reoccurring indications may be performed as noted.  Right Findings: +----------+------------+---------+-----------+----------+-------+  RIGHT     CompressiblePhasicitySpontaneousPropertiesSummary +----------+------------+---------+-----------+----------+-------+ Subclavian    Full       Yes       Yes                      +----------+------------+---------+-----------+----------+-------+  Left Findings: +----------+------------+---------+-----------+----------+--------------------+ LEFT      CompressiblePhasicitySpontaneousProperties      Summary        +----------+------------+---------+-----------+----------+--------------------+ IJV           Full       Yes       Yes                                   +----------+------------+---------+-----------+----------+--------------------+ Subclavian    Full       Yes       Yes                                   +----------+------------+---------+-----------+----------+--------------------+ Axillary      Full       Yes       Yes                                   +----------+------------+---------+-----------+----------+--------------------+  Brachial      Full       Yes       Yes                                   +----------+------------+---------+-----------+----------+--------------------+ Radial        Full                                                       +----------+------------+---------+-----------+----------+--------------------+ Ulnar         Full                                                       +----------+------------+---------+-----------+----------+--------------------+ Cephalic      None       No        No                 Acute - prox-mid                                                             forearm        +----------+------------+---------+-----------+----------+--------------------+ Basilic       Full       Yes       Yes                                   +----------+------------+---------+-----------+----------+--------------------+  Summary:  Right: No evidence of thrombosis in the subclavian.   Left: No evidence of deep vein thrombosis in the upper extremity. Findings consistent with acute superficial vein thrombosis involving the left cephalic vein.  *See table(s) above for measurements and observations.  Diagnosing physician: Coral ElseVance Brabham MD Electronically signed by Coral ElseVance Brabham MD on 11/09/2020 at 6:54:18 PM.    Final        Medical Problem List and Plan: 1.   Debility secondary to severe sepsis/bacteremia due to Staph aureus as well as weakness/pain from myositis  -patient may shower IV site covered  -ELOS/Goals: 7-10 days, mod I to supervision goals 2.  Antithrombotics: -DVT/anticoagulation: Venous Doppler studies negative Mechanical:  Antiembolism stockings, knee (TED hose) Bilateral lower extremities Sequential compression devices, below knee Bilateral lower extremities  -antiplatelet therapy: N/A 3. Pain Management: Oxycodone as needed as well as Robaxin  -kpad for muscular pain in legs 4. Mood: Atarax 25 mg twice daily as needed anxiety as well as 50 mg nightly as needed insomnia  -antipsychotic agents: N/A 5. Neuropsych: This patient is capable of making decisions on her  own behalf. 6. Skin/Wound Care: Routine skin checks 7. Fluids/Electrolytes/Nutrition: Routine in and outs with follow-up chemistries 8.  ID/Bacteremia/MRSA endocarditis.  Follow-up infectious disease..  Continue vancomycin through 12/19/2020 9.    Acute pulmonary emboli felt to be secondary to septic pulmonary emboli .initially on heparin which was discontinued as patient developed anemia.  Lower extremity Dopplers negative.  Pulmonary was involved no recommendations for ongoing anticoagulation at this time 10.  Anemia/leukocytosis.  Follow-up CBC 11.  Hyponatremia.  Improved with latest sodium 132. 12.  Poorly controlled diabetes mellitus.  Hemoglobin A1c 12.3.  NovoLog 7 units 3 times daily, Semglee 30 units daily.  Diabetic teaching  -adjust regimen as needed 13.  Constipation.Miralax BID  Charlton Amor, PA-C 11/11/2020

## 2020-11-11 NOTE — Progress Notes (Signed)
Chaplain answered consult call and visited with patient and her fiancee. Patient is being transferred to rehab either this afternoon or tomorrow. We agreed that this was a step up to her recovery. She seemed depressed and frustrated at all the complications she is having. She asked if I would pray for her to get better. She doesn't feel she is making progress--or if so, it is very very slow. Chaplain prayed with patient and fiancee and they were both appreciative of the visit. Chaplain is available when needed.    11/11/20 1130  Clinical Encounter Type  Visited With Patient and family together  Visit Type Initial  Referral From Nurse  Consult/Referral To Chaplain  Spiritual Encounters  Spiritual Needs Emotional;Prayer  Stress Factors  Patient Stress Factors Health changes;Loss of control  Family Stress Factors Health changes

## 2020-11-11 NOTE — Progress Notes (Signed)
Occupational Therapy Treatment Patient Details Name: Chelsea Mathis MRN: [id number] DOB: 1990-12-30 Today's Date: 11/11/2020    History of present illness Pt is 30 y.o. female admitted with DKA, multiple electrolyte disturbances and severe sepsis secondary to bacteremia on 10/31/20. CTA (+) cavitary lung lesions. 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 8/6 acute PE likely related to infective source rather than blood clot in setting of endocarditis started on IV heparin. CT femur suggestive of myositis on R thigh. PMHx significant for poorly controlled DMII and recent UTI.   OT comments  Patient met seated in recliner upon entry. RN administered pain meds during session. BUE HEP attempted with orange level 2 theraband but patient reports inability to complete exercises with this much resistance. Remainder of HEP completed without resistance. Patient continues to require Mod to Max A +2 for LB ADLs in standing. Patient unable to obtain/maintain figure-4 position with LLE over R knee (screaming/moaning in pain during attempt). Max A required to extend bilateral knees and adjust socks. Patient unable to maintain position of R or LLE in space against gravity. Patient then declined sit to stand transfers secondary to pain. Patient would benefit from continued acute OT services in prep for safe d/c to next level of care. OT will continue to follow acutely.    Follow Up Recommendations  CIR    Equipment Recommendations  Other (comment) (Defer to next levle of care.)    Recommendations for Other Services      Precautions / Restrictions Precautions Precautions: Fall Restrictions Weight Bearing Restrictions: No       Mobility Bed Mobility               General bed mobility comments: Seated in recliner upon entry.    Transfers                 General transfer comment: Declined sit to stand transfers from recliner secondary to pain in bilateral feet and R groin.     Balance Overall balance assessment: Needs assistance Sitting-balance support: Feet supported Sitting balance-Leahy Scale: Good Sitting balance - Comments: Maintains unsupported sitting position in recliner during BUE HEP.                                   ADL either performed or assessed with clinical judgement   ADL Overall ADL's : Needs assistance/impaired                     Lower Body Dressing: Maximal assistance;+2 for physical assistance;+2 for safety/equipment Lower Body Dressing Details (indicate cue type and reason): Patient reports inability to cross LLE over R knee to adjust sock. Max A to adjust footwear bilaterally.   Toilet Transfer Details (indicate cue type and reason): NT reports transfer to [facility] with use of [facility]. Patient requires assist for hygiene/clothing management.           General ADL Comments: Mod to Max A grossly.     Vision       Perception     Praxis      Cognition Arousal/Alertness: Awake/alert Behavior During Therapy: WFL for tasks assessed/performed Overall Cognitive Status: Within Functional Limits for tasks assessed                                          Exercises  Exercises: General Upper Extremity;Other exercises General Exercises - Upper Extremity Shoulder Flexion: AROM;Both;10 reps;Seated Shoulder Horizontal ABduction: AROM;Both;10 reps;Seated Shoulder Horizontal ADduction: AROM;Both;10 reps;Seated Elbow Flexion: AROM;Both;10 reps;Seated Other Exercises Other Exercises: Attempted BUE HEP with orange level 2 theraband but patient reports inability to complete exercises. Shifted to bodyweight only without resistance.   Shoulder Instructions       General Comments      Pertinent Vitals/ Pain       Pain Assessment: 0-10 Pain Score: 8  Pain Location: RLE and bilateral feet Pain Descriptors / Indicators: Aching;Sore;Sharp Pain Intervention(s): Limited activity within patient's  tolerance;Monitored during session;Repositioned;Premedicated before session  Home Living   Living Arrangements: Spouse/significant other           Home Layout: Bed/bath upstairs;1/2 bath on main level           Bathroom Accessibility: Yes How Accessible: Accessible via walker        Lives With: Spouse (43 and 102 year old children)    Prior Functioning/Environment              Frequency  Min 2X/week        Progress Toward Goals  OT Goals(current goals can now be found in the care plan section)  Progress towards OT goals: Progressing toward goals  Acute Rehab OT Goals Patient Stated Goal: To return home. OT Goal Formulation: With patient Time For Goal Achievement: 11/18/20 Potential to Achieve Goals: Good ADL Goals Pt Will Perform Grooming: with modified independence;standing Pt Will Perform Upper Body Dressing: Independently;sitting Pt Will Perform Lower Body Dressing: with modified independence;sitting/lateral leans Pt Will Transfer to Toilet: with modified independence;ambulating Pt Will Perform Toileting - Clothing Manipulation and hygiene: with modified independence;sit to/from stand Pt Will Perform Tub/Shower Transfer: Tub transfer;3 in 1;rolling walker Additional ADL Goal #1: Patient will tolerate 15 minutes of therapeutic activity with self-reported pain rating <3/10 in prep for ADLs/IADLs.  Plan Discharge plan remains appropriate;Frequency remains appropriate    Co-evaluation                 AM-PAC OT "6 Clicks" Daily Activity     Outcome Measure   Help from another person eating meals?: None Help from another person taking care of personal grooming?: A Little Help from another person toileting, which includes using toliet, bedpan, or urinal?: A Lot Help from another person bathing (including washing, rinsing, drying)?: A Lot Help from another person to put on and taking off regular upper body clothing?: A Little Help from another person  to put on and taking off regular lower body clothing?: A Lot 6 Click Score: 16    End of Session Equipment Utilized During Treatment: Gait belt  OT Visit Diagnosis: Unsteadiness on feet (R26.81);Muscle weakness (generalized) (M62.81);Pain Pain - Right/Left: Right Pain - part of body: Leg   Activity Tolerance Patient tolerated treatment well;Patient limited by pain   Patient Left in chair;with call bell/phone within reach;with nursing/sitter in room   Nurse Communication          Time: 2761-8485 OT Time Calculation (min): 26 min  Charges: OT General Charges $OT Visit: 1 Visit OT Treatments $Therapeutic Activity: 8-22 mins $Therapeutic Exercise: 8-22 mins  Shadia Larose H. OTR/L Supplemental OT, Department of rehab services [phone]   Taffie Eckmann R H. 11/11/2020, 9:46 AM

## 2020-11-11 NOTE — Progress Notes (Signed)
Inpatient Rehabilitation Medication Review by a Pharmacist  A complete drug regimen review was completed for this patient to identify any potential clinically significant medication issues.  Clinically significant medication issues were identified:  no  Check AMION for pharmacist assigned to patient if future medication questions/issues arise during this admission.  Pharmacist comments:   Time spent performing this drug regimen review (minutes):  5 minutes   Chelsea Mathis 11/11/2020 5:39 PM

## 2020-11-11 NOTE — Progress Notes (Signed)
INPATIENT REHABILITATION ADMISSION NOTE   Arrival Method: transferred via bed from 2W     Mental Orientation: A+Ox4   Assessment: refer to flowsheet     Skin: dry, flaky area to mid-buttocks   IV'S: RUE Midline   Pain: to BLE, constant   Tubes and Drains: none   Safety Measures: 3 side rails up, bed alarm on   Vital Signs: HR elevated, patient reports it is elevated at baseline around 110-120's. Patient asymptomatic at this time.    Height and Weight: see flowsheet   Rehab Orientation: explained to family on admission   Family: at bedside (fiance and mother)

## 2020-11-11 NOTE — Plan of Care (Signed)
  Problem: Education: Goal: Ability to describe self-care measures that may prevent or decrease complications (Diabetes Survival Skills Education) will improve Outcome: Progressing Goal: Individualized Educational Video(s) Outcome: Progressing   Problem: Metabolic: Goal: Ability to maintain appropriate glucose levels will improve Outcome: Progressing   Problem: Education: Goal: Knowledge of General Education information will improve Description: Including pain rating scale, medication(s)/side effects and non-pharmacologic comfort measures Outcome: Progressing   Problem: Health Behavior/Discharge Planning: Goal: Ability to manage health-related needs will improve Outcome: Progressing   Problem: Clinical Measurements: Goal: Ability to maintain clinical measurements within normal limits will improve Outcome: Progressing Goal: Will remain free from infection Outcome: Progressing Goal: Diagnostic test results will improve Outcome: Progressing Goal: Respiratory complications will improve Outcome: Progressing Goal: Cardiovascular complication will be avoided Outcome: Progressing   Problem: Activity: Goal: Risk for activity intolerance will decrease Outcome: Progressing   Problem: Nutrition: Goal: Adequate nutrition will be maintained Outcome: Progressing   Problem: Coping: Goal: Level of anxiety will decrease Outcome: Progressing   Problem: Elimination: Goal: Will not experience complications related to bowel motility Outcome: Progressing Goal: Will not experience complications related to urinary retention Outcome: Progressing   Problem: Pain Managment: Goal: General experience of comfort will improve Outcome: Progressing   Problem: Safety: Goal: Ability to remain free from injury will improve Outcome: Progressing   Problem: Skin Integrity: Goal: Risk for impaired skin integrity will decrease Outcome: Progressing   

## 2020-11-11 NOTE — H&P (Signed)
Physical Medicine and Rehabilitation Admission H&P        Chief Complaint  Patient presents with   Hyperglycemia   generalized pain  : HPI: Chelsea Mathis is a 30 year old right-handed female with history of uncontrolled diabetes mellitus as well as recent UTI.  Per chart review patient lives with significant other.  Reportedly independent prior to admission working for [employer].  Two-level home with 1-2 steps to entry.  Presented 10/31/2020 after patient reportedly was lifting a box much heavier than she thought it was noting right groin pain that increased and wraparound to her hip and low back.  She was seen by urgent care on 10/01/2020 for UTI given nitrofurantoin for 5 days.  On 7/17 she was seen again at urgent care felt to have UTI with hematuria and a muscle spasm in her right hip.  She was given a Toradol injection and a prescription for Augmentin x5 days.  On 7/18 her vaginal panel came back positive for bacterial vaginal infection and was given a prescription for Flagyl.  She again returned to the ER on 7/24 with right thigh pain of unclear etiology.  X-rays reportedly were completed negative as well as right lower extremity venous Doppler studies negative.  She was given a short course of pain medication(Norco) recommended follow-up with sports medicine.  She returned to the ER again 8/1 with reports of elevated blood sugars swelling to her hands and lower extremities and generalized pain.  She had followed at home prior to this latest arrival and reported generalized weakness.  CT of the right femur and abdomen/pelvis assessed showing low level edema tracking along the superficial fascia of the thigh and some mild infiltrative edema along the deep fascia planes in the thigh.  CT of abdomen pelvis revealed multiple cavitary lesions within the right lower lobe.  Labs noted severe hyponatremia 103 potassium 4.5 chloride 75 glucose 559 BUN 34 creatinine 1.01 alkaline phosphatase 203 albumin 2.1  indirect bilirubin 3.7 WBC 24,900 platelets 175,000 lactic acid 4.6.  She was placed on intravenous fluids and insulin drip in the ER.  Cultures returned showing staph aureus bacteremia/sepsis.  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  Repeat blood cultures 11/07/2020 negative.  Patient was placed on vancomycin x6 weeks through 12/19/2020.  Her acute pulmonary emboli felt to be secondary to septic pulmonary emboli initially placed on heparin later discontinued as she developed anemia as noted lower extremity Dopplers negative pulmonary service was involved no recommendations for ongoing anticoagulation at this time.  Acute on chronic anemia she was transfused 1 unit packed red blood cells with latest hemoglobin 7.5 and leukocytosis 19,400 improved from 23,800.  She did develop paroxysmal atrial fibrillation requiring amiodarone cardiac rate controlled amiodarone discontinued.  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  Her initial CK was 586 improved to 177.  Psychiatry was consulted for patient with mixed anxiety depression during hospital stay likely secondary to acute illness and Atarax was added as needed for anxiety.  Therapy evaluations completed due to patient decreased functional mobility was admitted for a comprehensive rehab program   Review of Systems  Constitutional:  Positive for fever. Negative for chills.  HENT:  Negative for hearing loss.   Eyes:  Negative for blurred vision and double vision.  Respiratory:  Negative for cough  and shortness of breath.   Cardiovascular:  Positive for leg swelling. Negative for chest pain and palpitations.  Gastrointestinal:  Positive for constipation. Negative for heartburn, nausea  and vomiting.  Genitourinary:  Negative for dysuria, flank pain and hematuria.  Musculoskeletal:  Positive for back pain, falls, joint pain and myalgias.  Skin:  Negative for rash.  Neurological:  Positive for weakness.  All other systems reviewed and are negative.     Past Medical History:  Diagnosis Date   Diabetes mellitus (HCC)     Gestational diabetes           Past Surgical History:  Procedure Laterality Date   LAPAROSCOPIC APPENDECTOMY N/A 11/11/2016    Procedure: APPENDECTOMY LAPAROSCOPIC;  Surgeon: Violeta Gelinas, MD;  [facility];  Service: General;  Laterality: N/A;   TEE WITHOUT CARDIOVERSION N/A 11/03/2020    Procedure: TRANSESOPHAGEAL ECHOCARDIOGRAM (TEE);  Surgeon: Jodelle Red, MD;  Location: [facility] ENDOSCOPY;  Service: Cardiovascular;  Laterality: N/A;         Family History  Problem Relation Age of Onset   Diabetes Mother     Hypertension Mother     Diabetes Maternal Uncle     Diabetes Brother     Stroke Maternal Grandmother     Leukemia Paternal Grandmother     Leukemia Paternal Grandfather      Social History:  reports that she has never smoked. She has never used smokeless tobacco. She reports that she does not drink alcohol and does not use drugs. Allergies: No Known Allergies       Medications Prior to Admission  Medication Sig Dispense Refill   acetaminophen (TYLENOL) 500 MG tablet Take 1,000 mg by mouth every 6 (six) hours as needed for moderate pain or headache.       aspirin 325 MG tablet Take 1,300 mg by mouth every 6 (six) hours as needed for headache.       ibuprofen (ADVIL) 200 MG tablet Take 800 mg by mouth every 6 (six) hours as needed for headache or mild pain.       [EXPIRED] predniSONE (STERAPRED UNI-PAK 48 TAB) 10 MG (48) TBPK tablet Take 20-60 mg by mouth as directed.  four times a day for 4 days,  four times a day for 4 days,  twice a day for 4 days.       amoxicillin-clavulanate (AUGMENTIN) 500-125 MG tablet Take 1  tablet by mouth 2 (two) times daily. For 5 days       HYDROcodone-acetaminophen (NORCO/VICODIN) 5-325 MG tablet Take 1 tablet by mouth every 6 (six) hours as needed. (Patient not taking: No sig reported) 6 tablet 0   metroNIDAZOLE (FLAGYL) 500 MG tablet Take 500 mg by mouth 2 (two) times daily. For 7 days          Drug Regimen Review Drug regimen was reviewed and remains appropriate with no significant issues identified   Home: Home Living Family/patient expects to be discharged to:: Private residence Living Arrangements: Spouse/significant other, Children Available Help at Discharge: Family, Available 24 hours/day Type of Home: House Home Access: Stairs to enter [employer] of Steps: 1-2 Entrance Stairs-Rails: None Home Layout: Two level Alternate Level Stairs-Number of Steps: 16 Alternate Level Stairs-Rails: Right Bathroom Shower/Tub: Engineer, manufacturing systems: Standard Home Equipment: Crutches   Functional History: Prior Function Level of Independence: Independent Comments: Drivers for door dash   Functional Status:  Mobility: Bed Mobility Overal bed mobility: Needs Assistance Bed Mobility: Supine to Sit, Sit to Supine  Supine to sit: Mod assist Sit to supine: Mod assist, +2 for physical assistance General bed mobility comments: Supine to sit: increased time but pt working legs to EOB with min A for R leg and then mod A to lift trunk.  Supine to sit: mod x 2 for pain control Transfers Overall transfer level: Needs assistance Equipment used: Rolling walker (2 wheeled) Transfer via Lift Equipment: Stedy Transfers: Sit to/from Stand Sit to Stand: Mod assist, +2 physical assistance, +2 safety/equipment Stand pivot transfers: Min assist, +2 physical assistance, From elevated surface General transfer comment: Min A x 2 but from highly elevated bed with increased time to rise and cues for hand placement Ambulation/Gait Ambulation/Gait assistance: Min assist,  +2 safety/equipment Gait Distance (Feet): 3 Feet Assistive device: Rolling walker (2 wheeled) Gait Pattern/deviations: Decreased stride length, Shuffle General Gait Details: Side steps toward HOB with RW and min A of 2 to steady and assist with walker. Gait velocity: decreased   ADL: ADL Overall ADL's : Needs assistance/impaired Eating/Feeding: Independent Grooming: Set up, Sitting Grooming Details (indicate cue type and reason): 2/3 grooming tasks in perched position on stedy at sink level. Lower Body Dressing: Bed level, Moderate assistance, +2 for physical assistance, +2 for safety/equipment Toilet Transfer: Total assistance Toilet Transfer Details (indicate cue type and reason): Stedy   Cognition: Cognition Overall Cognitive Status: Within Functional Limits for tasks assessed Orientation Level: Oriented X4 Cognition Arousal/Alertness: Awake/alert Behavior During Therapy: WFL for tasks assessed/performed Overall Cognitive Status: Within Functional Limits for tasks assessed   Physical Exam: Blood pressure 115/65, pulse (!) 112, temperature 99.8 F (37.7 C), temperature source Oral, resp. rate 20, height 5\' 4"  (1.626 m), weight 103.5 kg, last menstrual period 10/31/2020, SpO2 99 %, unknown if currently breastfeeding. Physical Exam Constitutional:      Appearance: She is not ill-appearing.  HENT:     Head: Normocephalic and atraumatic.     Right Ear: External ear normal.     Left Ear: External ear normal.     Nose: Nose normal.  Eyes:     Extraocular Movements: Extraocular movements intact.     Pupils: Pupils are equal, round, and reactive to light.  Cardiovascular:     Rate and Rhythm: Regular rhythm. Tachycardia present.     Heart sounds: No murmur heard.   No gallop.  Pulmonary:     Effort: Pulmonary effort is normal. No respiratory distress.     Breath sounds: No wheezing.  Abdominal:     General: There is no distension.     Palpations: Abdomen is soft.      Tenderness: There is no abdominal tenderness.  Musculoskeletal:        General: Swelling and tenderness (bilateral thighs and knees, right greater than left) present.     Cervical back: Normal range of motion.  Skin:    General: Skin is warm and dry.  Neurological:     Mental Status: She is alert.     Comments: Patient is alert.  Mood is a bit flat but appropriate.  Oriented x3 and follows commands. Reasonable insight and awareness. No focal sensory deficits. UE 3+/5 prox to 4/5 distally. LE: 3/5 HF to 4/5 ADF/PF. DTR's 1+  Psychiatric:        Mood and Affect: Mood normal.        Behavior: Behavior normal.        Thought Content: Thought content normal.        Judgment: Judgment normal.      Lab  Results Last 48 Hours        Results for orders placed or performed during the hospital encounter of 10/31/20 (from the past 48 hour(s))  Glucose, capillary     Status: None    Collection Time: 11/09/20  7:25 AM  Result Value Ref Range    Glucose-Capillary 89 70 - 99 mg/dL      Comment: Glucose reference range applies only to samples taken after fasting for at least 8 hours.  Glucose, capillary     Status: Abnormal    Collection Time: 11/09/20 11:05 AM  Result Value Ref Range    Glucose-Capillary 128 (H) 70 - 99 mg/dL      Comment: Glucose reference range applies only to samples taken after fasting for at least 8 hours.  Glucose, capillary     Status: Abnormal    Collection Time: 11/09/20  8:17 PM  Result Value Ref Range    Glucose-Capillary 104 (H) 70 - 99 mg/dL      Comment: Glucose reference range applies only to samples taken after fasting for at least 8 hours.  Comprehensive metabolic panel     Status: Abnormal    Collection Time: 11/10/20  5:14 AM  Result Value Ref Range    Sodium 132 (L) 135 - 145 mmol/L    Potassium 3.8 3.5 - 5.1 mmol/L    Chloride 98 98 - 111 mmol/L    CO2 28 22 - 32 mmol/L    Glucose, Bld 127 (H) 70 - 99 mg/dL      Comment: Glucose reference range applies  only to samples taken after fasting for at least 8 hours.    BUN <5 (L) 6 - 20 mg/dL    Creatinine, Ser 8.50 (L) 0.44 - 1.00 mg/dL    Calcium 7.7 (L) 8.9 - 10.3 mg/dL    Total Protein 6.6 6.5 - 8.1 g/dL    Albumin 1.4 (L) 3.5 - 5.0 g/dL    AST 23 15 - 41 U/L    ALT 11 0 - 44 U/L    Alkaline Phosphatase 85 38 - 126 U/L    Total Bilirubin 0.5 0.3 - 1.2 mg/dL    GFR, Estimated >27 >74 mL/min      Comment: (NOTE) Calculated using the CKD-EPI Creatinine Equation (2021)      Anion gap 6 5 - 15      Comment: Performed at [facility], [address] [place], [place] [address]  CBC     Status: Abnormal    Collection Time: 11/10/20  5:14 AM  Result Value Ref Range    WBC 19.4 (H) 4.0 - 10.5 K/uL    RBC 2.74 (L) 3.87 - 5.11 MIL/uL    Hemoglobin 7.5 (L) 12.0 - 15.0 g/dL    HCT 67.6 (L) 72.0 - 46.0 %    MCV 83.2 80.0 - 100.0 fL    MCH 27.4 26.0 - 34.0 pg    MCHC 32.9 30.0 - 36.0 g/dL    RDW 94.7 (H) 09.6 - 15.5 %    Platelets 467 (H) 150 - 400 K/uL    nRBC 0.3 (H) 0.0 - 0.2 %      Comment: Performed at [facility], [address] [place], [place] [address]  Magnesium     Status: None    Collection Time: 11/10/20  5:14 AM  Result Value Ref Range    Magnesium 2.0 1.7 - 2.4 mg/dL      Comment: Performed at  [facility], [address] [place], [place] [address]  Glucose, capillary     Status: None    Collection Time: 11/10/20  7:54 AM  Result Value Ref Range    Glucose-Capillary 92 70 - 99 mg/dL      Comment: Glucose reference range applies only to samples taken after fasting for at least 8 hours.  Glucose, capillary     Status: Abnormal    Collection Time: 11/10/20 11:30 AM  Result Value Ref Range    Glucose-Capillary 154 (H) 70 - 99 mg/dL      Comment: Glucose reference range applies only to samples taken after fasting for at least 8 hours.  Vancomycin, peak     Status: None    Collection Time: 11/10/20  2:24 PM  Result Value Ref Range    Vancomycin Pk 32 30 -  40 ug/mL      Comment: Performed at [facility], [address] [place], [place] [address]  Glucose, capillary     Status: Abnormal    Collection Time: 11/10/20  4:13 PM  Result Value Ref Range    Glucose-Capillary 57 (L) 70 - 99 mg/dL      Comment: Glucose reference range applies only to samples taken after fasting for at least 8 hours.  Glucose, capillary     Status: None    Collection Time: 11/10/20  4:30 PM  Result Value Ref Range    Glucose-Capillary 74 70 - 99 mg/dL      Comment: Glucose reference range applies only to samples taken after fasting for at least 8 hours.  Glucose, capillary     Status: Abnormal    Collection Time: 11/10/20  7:45 PM  Result Value Ref Range    Glucose-Capillary 195 (H) 70 - 99 mg/dL      Comment: Glucose reference range applies only to samples taken after fasting for at least 8 hours.  Vancomycin, trough     Status: Abnormal    Collection Time: 11/10/20  9:53 PM  Result Value Ref Range    Vancomycin Tr 11 (L) 15 - 20 ug/mL      Comment: Performed at [facility], [address] [place], [place] [address]       Imaging Results (Last 48 hours)  VAS US UPPER EXTREMITY VENOUS DUPLEX   Result Date: 11/09/2020 UPPER VENOUS STUDY  Patient Name:  Waylan RocherREGINEE Digangi  Date of Exam:   11/09/2020 Medical Rec #: [id number]       Accession #:    [id number] Date of Birth: 12/06/1990        Patient Gender: F Patient Age:   [age] years [facility] Procedure:      VAS US UPPER EXTREMITY VENOUS DUPLEX Referring Phys: Rebekah ChesterfieldLAXMAN POKHREL --------------------------------------------------------------------------------  Indications: Pain, and Swelling Comparison Study: Previous exam 11/06/20 - negative Performing Technologist: Ernestene MentionJody Hill RVT, RDMS  Examination Guidelines: A complete evaluation includes B-mode imaging, spectral Doppler, color Doppler, and power Doppler as needed of all accessible portions of each vessel. Bilateral testing is considered an  integral part of a complete examination. Limited examinations for reoccurring indications may be performed as noted.  Right Findings: +----------+------------+---------+-----------+----------+-------+ RIGHT     CompressiblePhasicitySpontaneousPropertiesSummary +----------+------------+---------+-----------+----------+-------+ Subclavian    Full       Yes       Yes                      +----------+------------+---------+-----------+----------+-------+  Left Findings: +----------+------------+---------+-----------+----------+--------------------+ LEFT      CompressiblePhasicitySpontaneousProperties      Summary        +----------+------------+---------+-----------+----------+--------------------+ IJV           Full       Yes       Yes                                   +----------+------------+---------+-----------+----------+--------------------+ Subclavian    Full       Yes       Yes                                   +----------+------------+---------+-----------+----------+--------------------+ Axillary      Full       Yes       Yes                                   +----------+------------+---------+-----------+----------+--------------------+ Brachial      Full       Yes       Yes                                   +----------+------------+---------+-----------+----------+--------------------+ Radial        Full                                                       +----------+------------+---------+-----------+----------+--------------------+ Ulnar         Full                                                       +----------+------------+---------+-----------+----------+--------------------+ Cephalic      None       No        No                 Acute - prox-mid                                                             forearm        +----------+------------+---------+-----------+----------+--------------------+ Basilic       Full        Yes       Yes                                   +----------+------------+---------+-----------+----------+--------------------+  Summary:  Right: No evidence of thrombosis in the subclavian.  Left: No evidence of deep vein thrombosis in the upper extremity. Findings consistent with acute superficial vein thrombosis involving the left cephalic vein.  *See table(s) above for measurements and observations.  Diagnosing physician: Coral Else MD Electronically signed by  Coral Else MD on 11/09/2020 at 6:54:18 PM.    Final              Medical Problem List and Plan: 1.   Debility secondary to severe sepsis/bacteremia due to Staph aureus as well as weakness/pain from myositis             -patient may shower IV site covered             -ELOS/Goals: 7-10 days, mod I to supervision goals 2.  Antithrombotics: -DVT/anticoagulation: Venous Doppler studies negative Mechanical:  Antiembolism stockings, knee (TED hose) Bilateral lower extremities Sequential compression devices, below knee Bilateral lower extremities             -antiplatelet therapy: N/A 3. Pain Management: Oxycodone as needed as well as Robaxin             -kpad for muscular pain in legs 4. Mood: Atarax 25 mg twice daily as needed anxiety as well as 50 mg nightly as needed insomnia             -antipsychotic agents: N/A 5. Neuropsych: This patient is capable of making decisions on her  own behalf. 6. Skin/Wound Care: Routine skin checks 7. Fluids/Electrolytes/Nutrition: Routine in and outs with follow-up chemistries 8.  ID/Bacteremia/MRSA endocarditis.  Follow-up infectious disease..  Continue vancomycin through 12/19/2020 9.    Acute pulmonary emboli felt to be secondary to septic pulmonary emboli .initially on heparin which was discontinued as patient developed anemia.  Lower extremity Dopplers negative.  Pulmonary was involved no recommendations for ongoing anticoagulation at this time 10.  Anemia/leukocytosis.  Follow-up  CBC 11.  Hyponatremia.  Improved with latest sodium 132. 12.  Poorly controlled diabetes mellitus.  Hemoglobin A1c 12.3.  NovoLog 7 units 3 times daily, Semglee 30 units daily.  Diabetic teaching             -adjust regimen as needed 13.  Constipation.Miralax BID   Charlton Amor, PA-C 11/11/2020   I have personally performed a face to face diagnostic evaluation of this patient and formulated the key components of the plan.  Additionally, I have personally reviewed laboratory data, imaging studies, as well as relevant notes and concur with the physician assistant's documentation above.  The patient's status has not changed from the original H&P.  Any changes in documentation from the acute care chart have been noted above.  Ranelle Oyster, MD, [place]

## 2020-11-11 NOTE — Evaluation (Signed)
Occupational Therapy Assessment and Plan  Patient Details  Name: Chelsea Mathis MRN: [id number] Date of Birth: Mar 18, 1991  OT Diagnosis: abnormal posture, acute pain, muscle weakness (generalized), and swelling of limb Rehab Potential: Rehab Potential (ACUTE ONLY): Good ELOS: 2-3 weeks   Today's Date: 11/12/2020 OT Individual Time: 8315-1761 and [id number] OT Individual Time Calculation (min): 85 min   and 40 min  Hospital Problem: Principal Problem:   Myositis   Past Medical History:  Past Medical History:  Diagnosis Date   Diabetes mellitus ([place])    Gestational diabetes    Past Surgical History:  Past Surgical History:  Procedure Laterality Date   LAPAROSCOPIC APPENDECTOMY N/A 11/11/2016   Procedure: APPENDECTOMY LAPAROSCOPIC;  Surgeon: Georganna Skeans, MD;  Location: [place];  Service: General;  Laterality: N/A;   TEE WITHOUT CARDIOVERSION N/A 11/03/2020   Procedure: TRANSESOPHAGEAL ECHOCARDIOGRAM (TEE);  Surgeon: Buford Dresser, MD;  Location: [facility] ENDOSCOPY;  Service: Cardiovascular;  Laterality: N/A;    Assessment & Plan Clinical Impression: Chelsea Mathis is a 30 year old right-handed female with history of uncontrolled diabetes mellitus as well as recent UTI.  Per chart review patient lives with significant other.  Reportedly independent prior to admission working for [employer].  Two-level home with 1-2 steps to entry.  Presented 10/31/2020 after patient reportedly was lifting a box much heavier than she thought it was noting right groin pain that increased and wraparound to her hip and low back.  She was seen by urgent care on 10/01/2020 for UTI given nitrofurantoin for 5 days.  On 7/17 she was seen again at urgent care felt to have UTI with hematuria and a muscle spasm in her right hip.  She was given a Toradol injection and a prescription for Augmentin x5 days.  On 7/18 her vaginal panel came back positive for bacterial vaginal infection and was given a prescription for Flagyl.   She again returned to the ER on 7/24 with right thigh pain of unclear etiology.  X-rays reportedly were completed negative as well as right lower extremity venous Doppler studies negative.  She was given a short course of pain medication(Norco) recommended follow-up with sports medicine.  She returned to the ER again 8/1 with reports of elevated blood sugars swelling to her hands and lower extremities and generalized pain.  She had followed at home prior to this latest arrival and reported generalized weakness.  CT of the right femur and abdomen/pelvis assessed showing low level edema tracking along the superficial fascia of the thigh and some mild infiltrative edema along the deep fascia planes in the thigh.  CT of abdomen pelvis revealed multiple cavitary lesions within the right lower lobe.  Labs noted severe hyponatremia 103 potassium 4.5 chloride 75 glucose 559 BUN 34 creatinine 1.01 alkaline phosphatase 203 albumin 2.1 indirect bilirubin 3.7 WBC 24,900 platelets 175,000 lactic acid 4.6.  She was placed on intravenous fluids and insulin drip in the ER.  Cultures returned showing staph aureus bacteremia/sepsis.  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  Repeat blood cultures 11/07/2020 negative.  Patient was placed on vancomycin x6 weeks through 12/19/2020.  Her acute pulmonary emboli felt to be secondary to septic pulmonary emboli initially placed on heparin later discontinued as she developed anemia as noted lower extremity Dopplers negative pulmonary service was involved no recommendations for ongoing anticoagulation at this time.  Acute on chronic anemia she was transfused  1 unit packed red blood cells with latest hemoglobin 7.5 and leukocytosis 19,400 improved from 23,800.  She did develop paroxysmal atrial fibrillation requiring  amiodarone cardiac rate controlled amiodarone discontinued.  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  Her initial CK was 586 improved to 177.  Psychiatry was consulted for patient with mixed anxiety depression during hospital stay likely secondary to acute illness and Atarax was added as needed for anxiety.  Therapy evaluations completed due to patient decreased functional mobility was admitted for a comprehensive rehab program  Patient currently requires max with basic self-care skills secondary to muscle weakness, decreased cardiorespiratoy endurance, and pain .  Prior to hospitalization, patient could complete BADLs with independent .  Patient will benefit from skilled intervention to increase independence with basic self-care skills prior to discharge home with care partner.  Anticipate patient will require 24 hour supervision and follow up home health.  OT - End of Session Endurance Deficit: Yes Endurance Deficit Description: Several seated and supine rest breaks needed in order to complete full ADL OT Assessment Rehab Potential (ACUTE ONLY): Good OT Barriers to Discharge: Home environment access/layout;IV antibiotics;Wound Care;Weight OT Patient demonstrates impairments in the following area(s): Balance;Edema;Endurance;Motor;Pain;Skin Integrity OT Basic ADL's Functional Problem(s): Grooming;Bathing;Dressing;Toileting OT Advanced ADL's Functional Problem(s): Simple Meal Preparation;Laundry OT Transfers Functional Problem(s): Toilet;Tub/Shower OT Additional Impairment(s): None OT Plan OT Intensity: Minimum of 1-2 x/day, 45 to 90 minutes OT Frequency: 5 out of 7 days OT Duration/Estimated Length of Stay: 2-3 weeks OT Treatment/Interventions: Balance/vestibular training;DME/adaptive equipment instruction;Patient/family education;Therapeutic Activities;Wheelchair  propulsion/positioning;Functional electrical stimulation;Psychosocial support;Therapeutic Exercise;UE/LE Strength taining/ROM;Self Care/advanced ADL retraining;Functional mobility training;Community reintegration;Discharge planning;Skin care/wound managment;UE/LE Coordination activities;Pain management;Disease mangement/prevention OT Self Feeding Anticipated Outcome(s): No goal OT Basic Self-Care Anticipated Outcome(s): Supervision-Mod I OT Toileting Anticipated Outcome(s): Supervision OT Bathroom Transfers Anticipated Outcome(s): Supervision OT Recommendation Recommendations for Other Services: Therapeutic Recreation consult;Neuropsych consult Therapeutic Recreation Interventions: Pet therapy;Kitchen group;Stress management Patient destination: Home Follow Up Recommendations: Home health OT Equipment Recommended: To be determined   OT Evaluation Precautions/Restrictions  Precautions Precautions: Fall Precaution Comments: high levels of pain Restrictions Weight Bearing Restrictions: No Vital Signs Therapy Vitals Temp: 98.4 F (36.9 C) Pulse Rate: (!) 110 Resp: 18 BP: 128/69 Patient Position (if appropriate): Sitting Oxygen Therapy SpO2: 95 % O2 Device: Room Air Pain Pain Assessment Pain Scale: 0-10 Pain Score: 3  Pain Location: Leg Pain Orientation: Right Pain Intervention(s): Repositioned Home Living/Prior Functioning Home Living Family/patient expects to be discharged to:: Private residence Living Arrangements: Spouse/significant other Available Help at Discharge: Family, Available 24 hours/day Type of Home: House Home Access: Stairs to enter Technical brewer of Steps: 1 Entrance Stairs-Rails: None Home Layout: Bed/bath upstairs, 1/2 bath on main level, Two level Alternate Level Stairs-Number of Steps: 16 Alternate Level Stairs-Rails: Right Bathroom Shower/Tub: Tub/shower unit (in bathroom on 2nd level) Bathroom Toilet: Standard Bathroom Accessibility:  Yes Additional Comments: Per pt, bathroom on main level is not walker accessible  Lives With: Significant other, Other (Comment) (lives with fiance and 2 children, ages 35 y/o and 65 y/o) IADL History Homemaking Responsibilities: Yes (Pt reported independent completion of meal prep, laundry, cleaning, and child care PTA) Occupation: Full time employment 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[employer], Museum/gallery exhibitions officer, Visual merchandiser Leisure and Hobbies: Spending time with her family Prior Function Level of Independence: Independent with basic ADLs, Independent with transfers, Independent with homemaking with ambulation, Independent with gait  Able to Take  Stairs?: Yes Driving: Yes Vocation: Full time employment Vocation Requirements: doing Psychiatric nurse Baseline Vision/History: No visual deficits Patient Visual Report: No change from baseline Vision Assessment?: No apparent visual deficits Perception  Perception: Within Functional Limits Praxis Praxis: Intact Cognition Overall Cognitive Status: Within Functional Limits for tasks assessed Arousal/Alertness: Awake/alert Orientation Level: Person;Place;Situation Person: Oriented Place: Oriented Situation: Oriented Year: 2022 Month: August Day of Week: Correct Memory: Appears intact Immediate Memory Recall: Sock;Blue;Bed Memory Recall Sock: Without Cue Memory Recall Blue: Without Cue Memory Recall Bed: Without Cue Awareness: Appears intact Problem Solving: Appears intact Behaviors: Other (comment) (anxious) Safety/Judgment: Appears intact Sensation Sensation Light Touch: Appears Intact Coordination Gross Motor Movements are Fluid and Coordinated: No Fine Motor Movements are Fluid and Coordinated: No Coordination and Movement Description: Affected by pain/swelling, pt with guarded functional LE movements, swelling in Rt LE and B UEs Finger Nose Finger Test: Mild incoordination in end ranges bilaterally Motor   Motor Motor: Abnormal postural alignment and control Motor - Skilled Clinical Observations: Weakness, deconditioning, decreased balance/postural control, and high pain levels  Trunk/Postural Assessment  Cervical Assessment Cervical Assessment: Within Functional Limits Thoracic Assessment Thoracic Assessment: Exceptions to [facility] (mild kyphosis) Lumbar Assessment Lumbar Assessment: Exceptions to [facility] (posterior pelvic tilt) Postural Control Postural Control:  (unable to thoroughly assess during eval as pt was in too much pain to complete sit<stand)  Balance Balance Balance Assessed: Yes Static Sitting Balance Static Sitting - Balance Support: Feet supported;Bilateral upper extremity supported Static Sitting - Level of Assistance: 5: Stand by assistance (supervision) Dynamic Sitting Balance Dynamic Sitting - Balance Support: Feet supported;No upper extremity supported Dynamic Sitting - Level of Assistance: 5: Stand by assistance (donning shirt) Static Standing Balance Static Standing - Balance Support: Bilateral upper extremity supported (RW) Static Standing - Level of Assistance: 4: Min assist Dynamic Standing Balance Dynamic Standing - Balance Support:  (unable to assess, pt in too much pain to attempt standing during OT eval) Dynamic Standing - Level of Assistance: 3: Mod assist Dynamic Standing - Comments: with transfers only Extremity/Trunk Assessment RUE Assessment RUE Assessment: Within Functional Limits (swelling in Rt hand, pt attributes to IV stick) Active Range of Motion (AROM) Comments: Shoulder ROM limited ~150 degrees, internal rotation WNL LUE Assessment LUE Assessment: Within Functional Limits (swelling in left forearm) Active Range of Motion (AROM) Comments: Shoulder ROM limited ~150 degrees  Care Tool Care Tool Self Care Eating  Not assessed      Oral Care    Oral Care Assist Level: Set up assist    Bathing   Body parts bathed by patient: Right arm;Left  arm;Chest;Abdomen;Right upper leg;Left upper leg;Face Body parts bathed by helper: Front perineal area;Buttocks;Right lower leg;Left lower leg   Assist Level: Moderate Assistance - Patient 50 - 74%    Upper Body Dressing(including orthotics)   What is the patient wearing?: Pull over shirt   Assist Level: Set up assist    Lower Body Dressing (excluding footwear)   What is the patient wearing?: Pants Assist for lower body dressing: Total Assistance - Patient < 25%    Putting on/Taking off footwear   What is the patient wearing?: Non-skid slipper socks;Ted hose         Care Tool Toileting Toileting activity Toileting Activity did not occur (Clothing management and hygiene only): N/A (no void or bm)       Care Tool Bed Mobility Roll left and right activity   Roll left and right assist level: Minimal Assistance - Patient > 75%  Sit to lying activity        Lying to sitting edge of bed activity   Lying to sitting edge of bed assist level: Moderate Assistance - Patient 50 - 74% (HOB elevated)     Care Tool Transfers Sit to stand transfer   Sit to stand assist level: Maximal Assistance - Patient 25 - 49%    Chair/bed transfer   Chair/bed transfer assist level: Moderate Assistance - Patient 50 - 74%     Toilet transfer   Assist Level: Moderate Assistance - Patient 50 - 74%     Care Tool Cognition Expression of Ideas and Wants Expression of Ideas and Wants: Without difficulty (complex and basic) - expresses complex messages without difficulty and with speech that is clear and easy to understand   Understanding Verbal and Non-Verbal Content Understanding Verbal and Non-Verbal Content: Understands (complex and basic) - clear comprehension without cues or repetitions       Refer to Care Plan for Long Term Goals  SHORT TERM GOAL WEEK 1 OT Short Term Goal 1 (Week 1): Pt will complete LB self care at sit<stand level during 2 consecutive OT sessions as evidence of improved  pain mgt OT Short Term Goal 2 (Week 1): Pt will complete 1 grooming task while standing at the sink to increase standing endurance OT Short Term Goal 3 (Week 1): Pt will complete 1/3 components of donning pants using AE as needed  Recommendations for other services: Neuropsych and Therapeutic Recreation  Pet therapy, Kitchen group, and Stress management   Skilled Therapeutic Intervention Skilled OT session completed with focus on initial evaluation, education on OT role/POC, and establishment of patient-centered goals.   Pt greeted in bed, teary due to pain, had personal hot pack placed on the Rt thigh. RN providing pain medicine at this time. Pt agreeable to complete bathing/dressing tasks EOB. Significantly increased time and Max A of 1 for supine<sit. Setup for UB bathing/dressing with pt requiring increased time. Tried to stand multiple times using RW however pt unable, teary still due to pain (in Rt LE mostly). Pt with swelling in this lower limb, limited mobility/strength-wise in both Lt LE and Rt LE, pt requiring Total A for LB self care, pt able to "kick" each LE out to assist therapist as needed. After completing oral care/grooming tasks with setup (noted swelling in Rt hand and Lt forearm), pt asked to return to bed. Resumed LB bathing and dressing tasks bedlevel, pt with skin breakdown on bottom. Advised pt to ask staff to assist her in sidelying position after lunch. Pt agreeable. +2 for boosting up in bed/repositioning for comfort. Left pt with all needs within reach and bed alarm set.   02 sats on RA remained 93% and above throughout session, strong education emphasis placed on diaphragmatic breathing/coordinating breath with all movement for pain mgt, positive results noted from pt  2nd Session 1:1 tx (40 min) Pt greeted in bed, still c/o pain in the Rt thigh. RN in at start of session to provide pain medicine. Pt was motivated to try [facility] transfer as she had to void bladder. OT retrieved  Jeralene Huff. Mod A for supine<sit with HOB elevated using the bedrail. Once pt was EOB, she c/o feeling like her sugar was low. Set her up with some pudding that was in the room, also notified NT for sugar check and to bring some extra snacks. When NT assessed sugar it was 46. Pt not feeling well but needing to urinate. She  returned to supine and was boosted up. 1 assist for bedpan placement and pt had +bladder void. Assistance needed for pericare. RN in at this time and discussed vaginal discharge present during hygiene and also during ADL session this morning. RN to monitor. Pt discouraged regarding her ability to participate in therapies today. Provided emotional support and encouragement, education provided on engaging in therapeutic activities throughout the day to lift her spirits, provided her with lavender aromatherapy and also a free reading book. Pt appreciative. Left her with all needs within reach, crackers + peanut butter, salad, and bed alarm set.    ADL ADL Grooming: Setup Where Assessed-Grooming: Edge of bed Upper Body Bathing: Setup Where Assessed-Upper Body Bathing: Edge of bed Lower Body Bathing: Moderate assistance Where Assessed-Lower Body Bathing: Edge of bed;Bed level Upper Body Dressing: Setup Where Assessed-Upper Body Dressing: Edge of bed Lower Body Dressing: Dependent Where Assessed-Lower Body Dressing: Edge of bed;Bed level Toileting: Not assessed Toilet Transfer: Moderate assistance (per PT eval) Toilet Transfer Method: Stand pivot (RW) Toilet Transfer Equipment: Bedside commode Tub/Shower Transfer: Not assessed Mobility  Unable to assess sit<stand due to pain   Discharge Criteria: Patient will be discharged from OT if patient refuses treatment 3 consecutive times without medical reason, if treatment goals not met, if there is a change in medical status, if patient makes no progress towards goals or if patient is discharged from hospital.  The above assessment,  treatment plan, treatment alternatives and goals were discussed and mutually agreed upon: by patient  Skeet Simmer 11/12/2020, 12:23 PM

## 2020-11-11 NOTE — Discharge Summary (Addendum)
Physician Discharge Summary  Chelsea Mathis VAN:[id number] DOB: [birth year] DOA: 10/31/2020  PCP: Pcp, No  Admit date: 10/31/2020 Discharge date: 11/11/2020  Admitted From: Home  Discharge disposition: CIR    Recommendations for Outpatient Follow-Up:   Monitor CBC BMP closely during rehabilitation stay. Continue IV vancomycin 6 weeks from 11/08/10 ( the day of negative culture) as per infectious disease recommendation. Please monitor for fever trend.  Consider ID follow-up during rehabilitation.  Discharge Diagnosis:   Principal Problem:   Severe sepsis (HCC) Active Problems:   DKA (diabetic ketoacidosis) ([place])   Bacteremia due to Staphylococcus aureus   Septic pulmonary embolism (HCC)   Type 2 diabetes mellitus with hypoglycemia without coma ([place])   Myositis   Myofasciitis   Adjustment disorder with mixed anxiety and depressed mood   Discharge Condition: Improved.  Diet recommendation: Low sodium, heart healthy.  Carbohydrate-modified.  Wound care: None.  Code status: Full.   History of Present Illness:   Chelsea Mathis is a 30 y.o. female with past medical history of diabetes mellitus type 2 who presented to the hospital with weakness, fatigue and features of sepsis.  Patient was noted to have a staph aureus bacteremia and was started on IV antibiotics.  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  Infectious disease was consulted.  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 Currently on vancomycin IV.  ID followed the patient during hospitalization.  Recent cultures from 11/07/2020 negative so far in 4 days.  Leukocytosis has trended down.  Patient has had a spike of fever today.  Will need closer monitoring.  Persistent staphylococcus bacteremia with  cavitary lung lesions Infectious disease was consulted.  On vancomycin IV will be continued on discharge..   Transesophageal Echocardiogram with tricuspid  valve thickening suggesting TV endocarditis. Repeat blood culture from 8/5 and 8/6 were positive for staphylococcus aureus.  Repeat blood cultures from 11/07/2020 negative so far in 4 days.  ID recommends vancomycin for 6 weeks from the date of negative blood cultures. Will need to follow-up in ID clinic after discharge.  Temperature max of 100.5 F this morning clinically looks stable..   Acute pulmonary embolism Thought to be secondary to septic pulmonary emboli.  Initially was on heparin which was discontinued.  Patient is currently developed anemia.  No DVT in the lower extremity.  Pulmonary was involved and there is no recommendation for ongoing anticoagulation.    Diabetic ketoacidosis with history of diabetes mellitus type 2.   Has improved at this time.  Was initially on insulin drip.  Has been transitioned to Lantus and sliding scale insulin.   Hemoglobin A1c of 12.3.  Will need to adjust insulin regimen while during rehab.   Tachycardia In the setting of acute illness and septic emboli.  We will continue to monitor closely.  Patient was febrile as well.   Acute on chronic anemia Off anticoagulation at this time.  Received 1 unit of packed RBC during hospitalization.  Hemoglobin of 7.5 from 11/10/2020.  Monitor periodically and transfuse if hemoglobin less than 7.   Thrombocytopenia Likely secondary to acute infection. Resolved, now with reactive thrombocytosis   Hyponatremia Improving.  Sodium of 132.  Monitor periodically   Acute kidney injury. Resolved with IV fluids.  Latest creatinine of 0.3.   Paroxysmal atrial fibrillation Was initially  on amiodarone bolus and drip.  Currently in sinus rhythm but mild sinus tachycardia still present..   Myositis/myofasciitis CK was elevated but now normal.  Aldolase was elevated.  ANA was  negative.  MRI of the bilateral thighs significant for myositis/myofasciitis.  Orthopedics was consulted and recommended conservative treatment.  Overall improving,  still complains of pain over the thighs..  No external erythema redness or induration.   Difficult IV access.  Has midline since patient had to wait for negative blood cultures for 72 hours but in the meantime needed an IV access..  Will need to consider PICC line prior to discharge for 6 weeks of IV antibiotic.Marland Kitchen   History of anxiety depression.  Psychiatry has seen the patient.  Likely secondary to acute illness.  Atarax has been initiated by psychiatry team for sleep and anxiety..   Disposition.  At this time, patient is stable for disposition to CIR.  Communicated with rehabilitation team.  Spoke with the significant other at bedside prior to disposition.  Medical Consultants:   PCCM Infectious disease Orthopedic surgery Psychiatry  Procedures:    TRANSTHORACIC ECHOCARDIOGRAM (11/01/2020) IMPRESSIONS    1. Left ventricular ejection fraction, by estimation, is 70 to 75%. The  left ventricle has hyperdynamic function. The left ventricle has no  regional wall motion abnormalities. Indeterminate diastolic filling due to  E-A fusion.   2. Right ventricular systolic function is normal. The right ventricular  size is normal.   3. The mitral valve is normal in structure. No evidence of mitral valve  regurgitation. No evidence of mitral stenosis.   4. The aortic valve is normal in structure. Aortic valve regurgitation is  not visualized. No aortic stenosis is present.   5. The inferior vena cava is normal in size with greater than 50%  respiratory variability, suggesting right atrial pressure of 3 mmHg.   Conclusion(s)/Recommendation(s): No evidence of valvular vegetations on  this transthoracic echocardiogram. Would recommend a transesophageal  echocardiogram to exclude infective endocarditis if clinically indicated    Subjective:   Today, patient was seen and examined at bedside.  Feels little better overall.  Less anxious.  Still got pain in her lower extremities.  Discharge Exam:   Vitals:   11/11/20 0600 11/11/20 0800  BP: 114/63 122/70  Pulse: (!) 111 (!) 104  Resp:  20  Temp: (!) 100.5 F (38.1 C)   SpO2:  99%   Vitals:   11/10/20 1946 11/11/20 0100 11/11/20 0600 11/11/20 0800  BP: 116/71 115/65 114/63 122/70  Pulse: (!) 122 (!) 112 (!) 111 (!) 104  Resp: 20   20  Temp: 97.9 F (36.6 C) 99.8 F (37.7 C) (!) 100.5 F (38.1 C)   TempSrc: Oral Oral Oral   SpO2: 98% 99%  99%  Weight:      Height:       Body mass index is 39.17 kg/m.   General: Alert awake, not in obvious distress, obese, mildly anxious HENT: pupils equally reacting to light,  No scleral pallor or icterus noted. Oral mucosa is moist.  Chest:  Clear breath sounds.  Diminished breath sounds bilaterally. No crackles or wheezes.  CVS: S1 &S2 heard. No murmur.  Regular rate and rhythm. Abdomen: Soft, nontender, nondistended.  Bowel sounds are heard.   Extremities: No cyanosis, clubbing or edema.  Peripheral pulses are palpable.  Midline in place. Psych: Alert, awake and oriented, slightly anxious CNS:  No cranial nerve deficits.  Moving extremities but painful lower extremities. Skin: Warm and dry.  No rashes noted.  The results of significant diagnostics from this hospitalization (including imaging, microbiology, ancillary and laboratory) are listed below for reference.     Diagnostic Studies:   CT CHEST WO CONTRAST  Result Date: 10/31/2020 CLINICAL DATA:  Chest pain and shortness of breath. EXAM: CT CHEST WITHOUT CONTRAST TECHNIQUE: Multidetector CT imaging of the chest was performed following the standard protocol without IV contrast. COMPARISON:  10/31/2020 CT abdomen FINDINGS: Cardiovascular: Unremarkable Mediastinum/Nodes: Unremarkable Lungs/Pleura: Scattered cavitary lesions in both lungs are mostly peripheral  in location but otherwise random. Some of the scattered pulmonary nodules are solid and not showing signs of cavitation. The cavitary lesions are variable wall thickness, some with relatively thin walls and some with thick walls. Index right middle lobe nodule which is partially cavitary measures 1.3 by 1.4 cm on image 68 series 4. Bilobed cavitary posterior basal segment right lower lobe nodule measures 1.5 by 1.2 cm on image 87 series 4 and has thick walls. Upper Abdomen: Unremarkable Musculoskeletal: Unremarkable IMPRESSION: 1. Bilateral solid and cavitary nodules in the lungs. Appearance favors other septic emboli or atypical infectious process such as tuberculosis or fungal infection. Substantially less likely are noninfective granulomatous process such as Wegener granulomatosis, or cavitary metastatic lesions. Electronically Signed   By: Van Clines M.D.   On: 10/31/2020 14:42   CT ABDOMEN PELVIS W CONTRAST  Addendum Date: 10/31/2020   ADDENDUM REPORT: 10/31/2020 12:21 ADDENDUM: Multifocal cavitary lesions within the right lower lobe, new since 09/23/2020. Consider dedicated CT chest for further evaluation. Findings conveyed to ordering clinician, Dr. Regan Lemming during the time of interpretation of the study. Electronically Signed   By: Michaelle Birks MD   On: 10/31/2020 12:21   Result Date: 10/31/2020 CLINICAL DATA:  30 year old female with right lower quadrant abdominal pain. EXAM: CT ABDOMEN AND PELVIS WITH CONTRAST TECHNIQUE: Multidetector CT imaging of the abdomen and pelvis was performed using the standard protocol following bolus administration of intravenous contrast. CONTRAST:  159m OMNIPAQUE IOHEXOL 300 MG/ML  SOLN COMPARISON:  CT and pelvis, 09/03/2020 and 11/10/2016. FINDINGS: The examination is mildly degraded secondary to motion artifact. Lower chest: Multifocal cavitary lesions within the right lower lobe, new since 09/23/2020 (see key image). Hepatobiliary: No focal liver  abnormality is seen. No gallstones, gallbladder wall thickening, or biliary dilatation. Pancreas: Unremarkable. No pancreatic ductal dilatation or surrounding inflammatory changes. Spleen: Normal in size without focal abnormality. Adrenals/Urinary Tract: Adrenal glands are unremarkable. Moderate rotational right renal anomaly. Trace right renal collecting system distention. No discrete hydronephrosis. Moderate distention of urinary bladder. Stomach/Bowel: Stomach is within normal limits. Appendix appears normal. No evidence of bowel wall thickening, distention, or inflammatory changes. Vascular/Lymphatic: No significant vascular findings are present. No enlarged abdominal or pelvic lymph nodes. Other: IUD. Tampon. No abdominal wall hernia or abnormality. No abdominopelvic ascites. Musculoskeletal: No acute or significant osseous findings. IMPRESSION: No acute abdominopelvic process. Electronically Signed: By: JMichaelle BirksMD On: 10/31/2020 12:02   CT FEMUR RIGHT WO CONTRAST  Result Date: 10/31/2020 CLINICAL DATA:  Soft tissue infection of the thigh EXAM: CT OF THE LOWER RIGHT EXTREMITY WITHOUT CONTRAST TECHNIQUE: Multidetector CT imaging of the right lower extremity was performed according to the standard protocol. COMPARISON:  10/23/2020 radiographs FINDINGS: Bones/Joint/Cartilage No bony destructive findings characteristic the right femur. Of osteomyelitis involving no large hip joint effusion or substantial knee joint effusion identified. Ligaments Suboptimally assessed by CT. Muscles and Tendons Subtle infiltrative stranding along deep fascia planes without a well-defined drainable fluid collection. No  gas tracks in the muscular tissues. Soft tissues Subcutaneous edema noted lateral to the right hip. Low-grade infiltrative edema tracks along the superficial fascia margin of the thigh laterally. At the level of the knee this is more confluent and tracks down into the lateral and posterior calf region. There is  a lesser degree of medial subcutaneous edema distally in the thigh although not substantially asymmetric from the contralateral medial thigh based on inclusion of the contralateral medial thigh. IMPRESSION: 1. Low-level edema tracks along the superficial fascia of the thigh and also there is some mild infiltrative edema along deep fascia planes in the thigh. No drainable abscess, osteomyelitis, or substantial joint effusion. This could be a manifestation of inflammation or third spacing of fluid. No gas in the soft tissues is identified to indicate a aggressive necrotizing infection; careful surveillance in a low threshold for reimaging suggested. Electronically Signed   By: Van Clines M.D.   On: 10/31/2020 14:34   DG Chest Port 1 View  Result Date: 10/31/2020 CLINICAL DATA:  Questionable sepsis EXAM: PORTABLE CHEST 1 VIEW COMPARISON:  None. FINDINGS: Heart is normal size. Patchy bilateral airspace opacities. No effusions or acute bony abnormality. IMPRESSION: Patchy bilateral airspace opacities concerning for pneumonia. Electronically Signed   By: Rolm Baptise M.D.   On: 10/31/2020 10:53   ECHOCARDIOGRAM COMPLETE  Result Date: 11/01/2020    ECHOCARDIOGRAM REPORT   Patient Name:   AMILLION SCOBEE Date of Exam: 11/01/2020 Medical Rec #:  [id number]      Height:       64.0 in Accession #:    [id number]     Weight:       206.8 lb Date of Birth:  14-Jan-1991       BSA:          1.984 m Patient Age:    30 years       BP:           115/69 mmHg Patient Gender: F              HR:           135 bpm. Exam Location:  Inpatient Procedure: 2D Echo, Color Doppler and Cardiac Doppler Indications:    Bacteremia  History:        Patient has no prior history of Echocardiogram examinations.                 Risk Factors:Diabetes.  Sonographer:    Raquel Sarna Senior RDCS Referring Phys: [id number] Donita Brooks  Sonographer Comments: Echo performed with high heart rate to rule out structural cause, has been elevated since admission.  IMPRESSIONS  1. Left ventricular ejection fraction, by estimation, is 70 to 75%. The left ventricle has hyperdynamic function. The left ventricle has no regional wall motion abnormalities. Indeterminate diastolic filling due to E-A fusion.  2. Right ventricular systolic function is normal. The right ventricular size is normal.  3. The mitral valve is normal in structure. No evidence of mitral valve regurgitation. No evidence of mitral stenosis.  4. The aortic valve is normal in structure. Aortic valve regurgitation is not visualized. No aortic stenosis is present.  5. The inferior vena cava is normal in size with greater than 50% respiratory variability, suggesting right atrial pressure of 3 mmHg. Conclusion(s)/Recommendation(s): No evidence of valvular vegetations on this transthoracic echocardiogram. Would recommend a transesophageal echocardiogram to exclude infective endocarditis if clinically indicated. FINDINGS  Left Ventricle: Left ventricular ejection fraction, by estimation, is 70 to 75%.  The left ventricle has hyperdynamic function. The left ventricle has no regional wall motion abnormalities. The left ventricular internal cavity size was normal in size. There is no left ventricular hypertrophy. Indeterminate diastolic filling due to E-A fusion. Right Ventricle: The right ventricular size is normal. No increase in right ventricular wall thickness. Right ventricular systolic function is normal. Left Atrium: Left atrial size was normal in size. Right Atrium: Right atrial size was normal in size. Pericardium: There is no evidence of pericardial effusion. Mitral Valve: The mitral valve is normal in structure. No evidence of mitral valve regurgitation. No evidence of mitral valve stenosis. Tricuspid Valve: The tricuspid valve is normal in structure. Tricuspid valve regurgitation is not demonstrated. No evidence of tricuspid stenosis. Aortic Valve: The aortic valve is normal in structure. Aortic valve  regurgitation is not visualized. No aortic stenosis is present. Pulmonic Valve: The pulmonic valve was normal in structure. Pulmonic valve regurgitation is not visualized. No evidence of pulmonic stenosis. Aorta: The aortic root is normal in size and structure. Venous: The inferior vena cava is normal in size with greater than 50% respiratory variability, suggesting right atrial pressure of 3 mmHg. IAS/Shunts: No atrial level shunt detected by color flow Doppler.  LEFT VENTRICLE PLAX 2D LVIDd:         3.40 cm LVIDs:         2.00 cm LV PW:         1.00 cm LV IVS:        1.00 cm LVOT diam:     2.00 cm LV SV:         44 LV SV Index:   22 LVOT Area:     3.14 cm  RIGHT VENTRICLE RV S prime:     15.70 cm/s TAPSE (M-mode): 1.8 cm LEFT ATRIUM           Index LA diam:      2.60 cm 1.31 cm/m LA Vol (A4C): 32.1 ml 16.18 ml/m  AORTIC VALVE LVOT Vmax:   94.20 cm/s LVOT Vmean:  70.900 cm/s LVOT VTI:    0.140 m  AORTA Ao Root diam: 3.40 cm Ao Asc diam:  2.90 cm  SHUNTS Systemic VTI:  0.14 m Systemic Diam: 2.00 cm Mihai Croitoru MD Electronically signed by Sanda Klein MD Signature Date/Time: 11/01/2020/10:22:04 AM    Final      Labs:   Basic Metabolic Panel: Recent Labs  Lab 11/05/20 0157 11/06/20 0523 11/08/20 0810 11/09/20 0354 11/10/20 0514  NA 128* 130* 131* 131* 132*  K 3.3* 4.2 4.0 3.9 3.8  CL 91* 94* 97* 95* 98  CO2 '30 30 31 28 28  ' GLUCOSE 124* 139* 163* 100* 127*  BUN 6 <5* <5* <5* <5*  CREATININE 0.42* 0.41* 0.40* 0.37* 0.39*  CALCIUM 7.4* 7.3* 7.6* 7.7* 7.7*  MG  --   --   --   --  2.0   GFR Estimated Creatinine Clearance: 118.3 mL/min (A) (by C-G formula based on SCr of 0.39 mg/dL (L)). Liver Function Tests: Recent Labs  Lab 11/08/20 0810 11/09/20 0354 11/10/20 0514  AST '19 21 23  ' ALT '11 10 11  ' ALKPHOS 86 85 85  BILITOT 1.0 0.8 0.5  PROT 6.4* 6.4* 6.6  ALBUMIN 1.4* 1.4* 1.4*   No results for input(s): LIPASE, AMYLASE in the last 168 hours. No results for input(s): AMMONIA in  the last 168 hours. Coagulation profile No results for input(s): INR, PROTIME in the last 168 hours.  CBC: Recent Labs  Lab 11/06/20 0748 11/06/20 1634 11/07/20 0455 11/07/20 1746 11/08/20 0250 11/08/20 0810 11/09/20 0354 11/10/20 0514  WBC 38.1* 40.2* 38.5*  --  28.4*  --  23.8* 19.4*  NEUTROABS 33.1*  --   --   --   --   --   --   --   HGB 6.6* 7.4* 7.0* 7.0* 6.6* 6.8* 7.5* 7.5*  HCT 19.4* 21.4* 21.1* 20.9* 20.6* 20.5* 23.2* 22.8*  MCV 76.4* 78.1* 80.5  --  82.1  --  83.2 83.2  PLT 368 406* 415*  --  428*  --  467* 467*   Cardiac Enzymes: No results for input(s): CKTOTAL, CKMB, CKMBINDEX, TROPONINI in the last 168 hours. BNP: Invalid input(s): POCBNP CBG: Recent Labs  Lab 11/10/20 1130 11/10/20 1613 11/10/20 1630 11/10/20 1945 11/11/20 0745  GLUCAP 154* 57* 74 195* 95   D-Dimer No results for input(s): DDIMER in the last 72 hours. Hgb A1c No results for input(s): HGBA1C in the last 72 hours. Lipid Profile No results for input(s): CHOL, HDL, LDLCALC, TRIG, CHOLHDL, LDLDIRECT in the last 72 hours. Thyroid function studies No results for input(s): TSH, T4TOTAL, T3FREE, THYROIDAB in the last 72 hours.  Invalid input(s): FREET3 Anemia work up No results for input(s): VITAMINB12, FOLATE, FERRITIN, TIBC, IRON, RETICCTPCT in the last 72 hours. Microbiology Recent Results (from the past 240 hour(s))  Culture, blood (routine x 2)     Status: Abnormal   Collection Time: 11/04/20  5:50 AM   Specimen: BLOOD RIGHT HAND  Result Value Ref Range Status   Specimen Description BLOOD RIGHT HAND  Final   Special Requests   Final    BOTTLES DRAWN AEROBIC ONLY Blood Culture results may not be optimal due to an inadequate volume of blood received in culture bottles   Culture  Setup Time   Final    GRAM POSITIVE COCCI IN CLUSTERS AEROBIC BOTTLE ONLY CRITICAL RESULT CALLED TO, READ BACK BY AND VERIFIED WITH: L. SEAY,PHARMD 0211 11/05/2020 T. TYSOR    Culture (A)  Final     STAPHYLOCOCCUS AUREUS SUSCEPTIBILITIES PERFORMED ON PREVIOUS CULTURE WITHIN THE LAST 5 DAYS. Performed at [facility], [place] [address] [place], [place] [address]    Report Status 11/07/2020 FINAL  Final  Culture, blood (routine x 2)     Status: None   Collection Time: 11/04/20  6:00 AM   Specimen: BLOOD RIGHT HAND  Result Value Ref Range Status   Specimen Description BLOOD RIGHT HAND  Final   Special Requests   Final    BOTTLES DRAWN AEROBIC ONLY Blood Culture adequate volume   Culture   Final    NO GROWTH 5 DAYS Performed at [facility], [place] [address] [place], [place] [address]    Report Status 11/09/2020 FINAL  Final  Culture, blood (routine x 2)     Status: Abnormal   Collection Time: 11/05/20  8:17 AM   Specimen: BLOOD  Result Value Ref Range Status   Specimen Description BLOOD RIGHT ANTECUBITAL  Final   Special Requests   Final    BOTTLES DRAWN AEROBIC AND ANAEROBIC Blood Culture results may not be optimal due to an inadequate volume of blood received in culture bottles   Culture  Setup Time   Final    GRAM POSITIVE COCCI IN CLUSTERS ANAEROBIC BOTTLE ONLY CRITICAL VALUE NOTED.  VALUE IS CONSISTENT WITH PREVIOUSLY REPORTED AND CALLED VALUE. Performed at [facility], [place] [address] [place], [place] [address]  Culture METHICILLIN RESISTANT STAPHYLOCOCCUS AUREUS (A)  Final   Report Status 11/08/2020 FINAL  Final   Organism ID, Bacteria METHICILLIN RESISTANT STAPHYLOCOCCUS AUREUS  Final      Susceptibility   Methicillin resistant staphylococcus aureus - MIC*    CIPROFLOXACIN >=8 RESISTANT Resistant     ERYTHROMYCIN >=8 RESISTANT Resistant     GENTAMICIN <=0.5 SENSITIVE Sensitive     OXACILLIN >=4 RESISTANT Resistant     TETRACYCLINE <=1 SENSITIVE Sensitive     VANCOMYCIN 1 SENSITIVE Sensitive     TRIMETH/SULFA <=10 SENSITIVE Sensitive     CLINDAMYCIN <=0.25 SENSITIVE Sensitive     RIFAMPIN <=0.5 SENSITIVE Sensitive     Inducible Clindamycin  NEGATIVE Sensitive     * METHICILLIN RESISTANT STAPHYLOCOCCUS AUREUS  Culture, blood (routine x 2)     Status: None   Collection Time: 11/05/20 10:06 AM   Specimen: BLOOD RIGHT HAND  Result Value Ref Range Status   Specimen Description BLOOD RIGHT HAND  Final   Special Requests   Final    BOTTLES DRAWN AEROBIC AND ANAEROBIC Blood Culture adequate volume   Culture   Final    NO GROWTH 5 DAYS Performed at [facility], [address], [place], [place] [address]    Report Status 11/10/2020 FINAL  Final  Culture, blood (routine x 2)     Status: None (Preliminary result)   Collection Time: 11/07/20 11:36 AM   Specimen: BLOOD  Result Value Ref Range Status   Specimen Description BLOOD RIGHT ANTECUBITAL  Final   Special Requests   Final    BOTTLES DRAWN AEROBIC ONLY Blood Culture adequate volume   Culture   Final    NO GROWTH 3 DAYS Performed at [facility], [place] [address] [place], [place] [address]    Report Status PENDING  Incomplete  Culture, blood (routine x 2)     Status: None (Preliminary result)   Collection Time: 11/07/20 11:36 AM   Specimen: BLOOD RIGHT HAND  Result Value Ref Range Status   Specimen Description BLOOD RIGHT HAND  Final   Special Requests   Final    BOTTLES DRAWN AEROBIC ONLY Blood Culture adequate volume   Culture   Final    NO GROWTH 3 DAYS Performed at [facility], [place] [address] [place], [place] [address]    Report Status PENDING  Incomplete     Discharge Instructions:   Discharge Instructions     [facility] pharmacist to adjust dose for Vancomycin, Aminoglycosides and other anti-infective therapies as requested by physician.   Complete by: As directed    [facility] to provide Cath Flo 24m   Complete by: As directed    Administer for PICC line occlusion and as ordered by physician for other access device issues.   Amb Referral to Nutrition and Diabetic Education   Complete by: As directed     Anaphylaxis Kit: Provided to treat any anaphylactic reaction to the medication being provided to the patient if First Dose or when requested by physician   Complete by: As directed    Epinephrine 166mml vial / amp: Administer 0.24m56m0.24ml31mubcutaneously once for moderate to severe anaphylaxis, nurse to call physician and pharmacy when reaction occurs and call 911 if needed for immediate care   Diphenhydramine 50mg84mIV vial: Administer 25-50mg 66mM PRN for first dose reaction, rash, itching, mild reaction, nurse to call physician and pharmacy when reaction occurs   Sodium Chloride 0.9% NS  512m IV: Administer if needed for hypovolemic blood pressure drop or as ordered by physician after call to physician with anaphylactic reaction   Change dressing on IV access line weekly and PRN   Complete by: As directed    Diet - low sodium heart healthy   Complete by: As directed    Diet Carb Modified   Complete by: As directed    Discharge instructions   Complete by: As directed    As per rehabilitation, continue antibiotics   Flush IV access with Sodium Chloride 0.9% and Heparin 10 units/ml or 100 units/ml   Complete by: As directed    Home infusion instructions - [facility]   Complete by: As directed    Instructions: Flush IV access with Sodium Chloride 0.9% and Heparin 10units/ml or 100units/ml   Change dressing on IV access line: Weekly and PRN   Instructions Cath Flo 243m Administer for PICC Line occlusion and as ordered by physician for other access device   [facility] pharmacist to adjust dose for: Vancomycin, Aminoglycosides and other anti-infective therapies as requested by physician   Increase activity slowly   Complete by: As directed    Method of administration may be changed at the discretion of home infusion pharmacist based upon assessment of the patient and/or caregiver's ability to self-administer the medication ordered   Complete by: As directed        Allergies as of 11/11/2020   No Known Allergies      Medication List     STOP taking these medications    amoxicillin-clavulanate 500-125 MG tablet Commonly known as: AUGMENTIN   aspirin 325 MG tablet   HYDROcodone-acetaminophen 5-325 MG tablet Commonly known as: NORCO/VICODIN   ibuprofen 200 MG tablet Commonly known as: ADVIL   metroNIDAZOLE 500 MG tablet Commonly known as: FLAGYL   predniSONE 10 MG (48) Tbpk tablet Commonly known as: STERAPRED UNI-PAK 48 TAB       TAKE these medications    acetaminophen 500 MG tablet Commonly known as: TYLENOL Take 1,000 mg by mouth every 6 (six) hours as needed for moderate pain or headache.   feeding supplement Liqd Take 237 mLs by mouth 2 (two) times daily between meals.   hydrOXYzine 25 MG tablet Commonly known as: ATARAX/VISTARIL Take 1 tablet (25 mg total) by mouth 2 (two) times daily as needed for anxiety.   hydrOXYzine 50 MG tablet Commonly known as: ATARAX/VISTARIL Take 1 tablet (50 mg total) by mouth at bedtime as needed (insomnia).   insulin aspart 100 UNIT/ML injection Commonly known as: novoLOG Inject 0-15 Units into the skin 3 (three) times daily with meals.   insulin aspart 100 UNIT/ML injection Commonly known as: novoLOG Inject 8 Units into the skin 3 (three) times daily with meals.   insulin glargine-yfgn 100 UNIT/ML injection Commonly known as: SEMGLEE Inject 0.32 mLs (32 Units total) into the skin daily. Start taking on: November 12, 2020   methocarbamol 500 MG tablet Commonly known as: ROBAXIN Take 1 tablet (500 mg total) by mouth every 8 (eight) hours as needed for muscle spasms.   multivitamin with minerals Tabs tablet Take 1 tablet by mouth daily. Start taking on: November 12, 2020   oxyCODONE-acetaminophen 5-325 MG tablet Commonly known as: PERCOCET/ROXICET Take 1-2 tablets by mouth every 4 (four) hours as needed for severe pain or moderate pain.   polyethylene glycol 17 g  packet Commonly known as: MIRALAX / GLYCOLAX Take 17 g by mouth 2 (two) times daily.  senna-docusate 8.6-50 MG tablet Commonly known as: Senokot-S Take 1 tablet by mouth 2 (two) times daily.   vancomycin  IVPB Inject 1,750 mg into the vein every 12 (twelve) hours. Indication:  MRSA endocarditis First Dose: Yes Last Day of Therapy:  12/19/20 Labs - "Sunday/Monday:  CBC/D, BMP, and vancomycin trough. Labs - Thursday:  BMP and vancomycin trough Labs - Every other week:  ESR and CRP Method of administration:Elastomeric Method of administration may be changed at the discretion of the patient and/or caregiver's ability to self-administer the medication ordered.   vancomycin HCl 1750 MG/350ML Soln Commonly known as: VANCOREADY Inject 350 mLs (1,750 mg total) into the vein every 12 (twelve) hours.               Discharge Care Instructions  (From admission, onward)           Start     Ordered   11/11/20 0000  Change dressing on IV access line weekly and PRN  (Home infusion instructions - [facility] )        08" /12/22 1048             Time coordinating discharge: 39 minutes  Signed:  Stephaie Dardis  [facility] 11/11/2020, 10:48 AM

## 2020-11-11 NOTE — Progress Notes (Signed)
Physical Therapy Treatment Patient Details Name: Chelsea Mathis MRN: [id number] DOB: Aug 03, 1990 Today's Date: 11/11/2020    History of Present Illness Pt is 30 y.o. female admitted with DKA, multiple electrolyte disturbances and severe sepsis secondary to bacteremia on 10/31/20. CTA (+) cavitary lung lesions. 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 8/6 acute PE likely related to infective source rather than blood clot in setting of endocarditis started on IV heparin. CT femur suggestive of myositis on R thigh. PMHx significant for poorly controlled DMII and recent UTI.    PT Comments    Pt making gradual progress, pain is a limiting factor.  She was able to ambulate 5' with min-mod A of 2 using upright rollator.  Pt had some c/o lightheadedness in standing but reports mild, BP stable upon sitting, pt had received IV pain meds just prior to session.     Follow Up Recommendations  CIR     Equipment Recommendations  Rolling walker with 5" wheels;Wheelchair cushion (measurements PT);Wheelchair (measurements PT);3in1 (PT)    Recommendations for Other Services       Precautions / Restrictions Precautions Precautions: Fall Restrictions Weight Bearing Restrictions: No    Mobility  Bed Mobility               General bed mobility comments: Seated in recliner upon entry; wanted to stay at EOB    Transfers Overall transfer level: Needs assistance Equipment used:  (upright rollator) Transfers: Sit to/from Stand Sit to Stand: Mod assist;+2 physical assistance         General transfer comment: Sit to stand from recliner with heavy mod A x 2 and increased time to rise.  Assist to transition arms to platforms  Ambulation/Gait Ambulation/Gait assistance: Min assist;Mod assist;+2 safety/equipment Gait Distance (Feet): 5 Feet Assistive device:  (upright rollator) Gait Pattern/deviations: Decreased stride length;Step-to pattern;Shuffle;Decreased weight shift to right Gait  velocity: decreased   General Gait Details: 5' with 1 standing rest break; mod A of 2 progressed to min A of 2; Initially requiring assist to advance R LE but able to improve after 2-3 steps; cues for sequencing and assist to weight shift; fatigued easily; very labored/painful gait; had some c/o mild lightheadedness but able to tolerate and BP stable when seated in bed; she had received IV meds at start of session   Stairs             Wheelchair Mobility    Modified Rankin (Stroke Patients Only)       Balance Overall balance assessment: Needs assistance Sitting-balance support: Feet supported Sitting balance-Leahy Scale: Normal Sitting balance - Comments: Maintains unsupported sitting position in recliner during BUE HEP.   Standing balance support: Bilateral upper extremity supported;During functional activity Standing balance-Leahy Scale: Poor Standing balance comment: Requiring bil UE support                            Cognition Arousal/Alertness: Awake/alert Behavior During Therapy: WFL for tasks assessed/performed Overall Cognitive Status: Within Functional Limits for tasks assessed                                        Exercises General Exercises - Lower Extremity Ankle Circles/Pumps: AROM;Both;10 reps;Seated Long Arc Quad: AROM;Both;Seated;10 reps Hip Flexion/Marching: AAROM;Right;AROM;Left;10 reps;Seated    General Comments General comments (skin integrity, edema, etc.): VSS      Pertinent Vitals/Pain  Pain Assessment: 0-10 Pain Score: 8  Pain Location: RLE and bilateral feet Pain Descriptors / Indicators: Aching;Sore;Sharp Pain Intervention(s): Limited activity within patient's tolerance;Monitored during session;Premedicated before session    Home Living   Living Arrangements: Spouse/significant other         Home Layout: Bed/bath upstairs;1/2 bath on main level        Prior Function            PT Goals (current  goals can now be found in the care plan section) Acute Rehab PT Goals Patient Stated Goal: To return home. Progress towards PT goals: Progressing toward goals    Frequency    Min 3X/week      PT Plan Current plan remains appropriate    Co-evaluation              AM-PAC PT "6 Clicks" Mobility   Outcome Measure  Help needed turning from your back to your side while in a flat bed without using bedrails?: A Lot Help needed moving from lying on your back to sitting on the side of a flat bed without using bedrails?: A Lot Help needed moving to and from a bed to a chair (including a wheelchair)?: Total Help needed standing up from a chair using your arms (e.g., wheelchair or bedside chair)?: Total Help needed to walk in hospital room?: Total Help needed climbing 3-5 steps with a railing? : Total 6 Click Score: 8    End of Session Equipment Utilized During Treatment: Gait belt Activity Tolerance: Patient tolerated treatment well Patient left: in bed;with call bell/phone within reach;with family/visitor present (sitting EOB with family nearby) Nurse Communication: Mobility status PT Visit Diagnosis: Unsteadiness on feet (R26.81);Muscle weakness (generalized) (M62.81)     Time: 1324-4010 PT Time Calculation (min) (ACUTE ONLY): 25 min  Charges:  $Gait Training: 8-22 mins $Therapeutic Exercise: 8-22 mins                     Chelsea Mathis, PT Acute Rehab Services Pager [phone] [facility] [phone]    Chelsea Mathis 11/11/2020, 12:59 PM

## 2020-11-11 NOTE — Progress Notes (Signed)
Meredith Staggers, MD   Physician  Physical Medicine and Rehabilitation  PMR Pre-admission     Signed  Date of Service:  11/11/2020  9:33 AM       Related encounter: ED to Hosp-Admission (Current) from 10/31/2020 in [place] [facility]       Signed      Show:Clear all _0 Written_1 Templated_2 Copied  Added by: _3 Cristina Gong, RN_4 Meredith Staggers, MD  _5 Hover for details                                                                                                                                                                                                                                                                                                                                                                                                                                                       PMR Admission Coordinator Pre-Admission Assessment   Patient: Chelsea Mathis is an 30 y.o., female MRN: [id number] DOB: [birth year] Height: _6  (162.6 cm) Weight: 103.5 kg   Insurance Information   PRIMARY: [place] Medicaid [employer]      Policy#: [id number]      Subscriber: pt CM Name: approved via fax from [place]      Phone#: [phone]     Fax#: [phone] Pre-Cert#: [id number] approved until 8/19      Employer:  Benefits:  Phone #: [phone]     Name: 8/11 Eff. Date: active7/04/2020     Deduct: none      Out of Pocket Max: none      Life Max: none CIR: per Medicaid guidelines      SECONDARY:   none   Financial Counselor:       Phone#:    The Therapist, art Information Summary" for patients in Inpatient Rehabilitation Facilities with attached "Privacy Act [place] Records" was provided and verbally reviewed with: N/A   Emergency Contact Information Contact Information       Name Relation  Home Work Mobile    [place] Significant other     [place] Mother     [phone]    Matthew, Pais     [phone]           Current Medical History  Patient Admitting Diagnosis: debility due to sepsis   History of Present Illness:  30 year old right-handed female with history of uncontrolled diabetes mellitus as well as recent UTI.   Presented 10/31/2020 after patient reportedly was lifting a box much heavier than she thought it was noting right groin pain that increased and wraparound to her hip and low back.  She was seen by urgent care on 10/01/2020 for UTI given nitrofurantoin for 5 days.  On 7/17 she was seen again at urgent care felt to have UTI with hematuria and a muscle spasm in her right hip.  She was given a Toradol injection and a prescription for Augmentin x5 days.  On 7/18 her vaginal panel came back positive for bacterial vaginal infection and was given a prescription for Flagyl.  She again returned to the ER on 7/24 with right thigh pain of unclear etiology.  X-rays reportedly were completed negative as well as right lower extremity venous Doppler studies negative.  She was given a short course of pain medication(Norco) recommended follow-up with sports medicine.  She returned to the ER again 8/1 with reports of elevated blood sugars swelling to her hands and lower extremities and generalized pain.  She had followed at home prior to this latest arrival and reported generalized weakness.  CT of the right femur and abdomen/pelvis assessed showing low level edema tracking along the superficial fascia of the thigh and some mild infiltrative edema along the deep fascia planes in the thigh.  CT of abdomen pelvis revealed multiple cavitary lesions within the right lower lobe.  Labs noted severe hyponatremia 103 potassium 4.5 chloride 75 glucose 559 BUN 34 creatinine 1.01 alkaline phosphatase 203 albumin 2.1 indirect bilirubin 3.7 WBC 24,900 platelets 175,000  lactic acid 4.6.  She was placed on intravenous fluids and insulin drip in the ER.  Cultures returned showing staph aureus bacteremia/sepsis.  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  Repeat blood cultures 11/07/2020 negative.  Patient was placed on vancomycin x6 weeks through 12/19/2020.  Her acute pulmonary emboli felt to be secondary to septic pulmonary emboli initially placed on heparin later discontinued as she developed anemia as noted lower extremity Dopplers negative pulmonary service was involved no recommendations for ongoing anticoagulation at this time.  Acute on chronic anemia she was transfused 1 unit packed red blood cells with latest hemoglobin 7.5 and leukocytosis 19,400 improved from 23,800.  She did develop paroxysmal atrial fibrillation requiring amiodarone cardiac rate controlled amiodarone discontinued.  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  Her initial CK was 586 improved to 177.  Psychiatry was consulted for patient with mixed anxiety depression during hospital stay likely secondary to acute illness and Atarax was added as needed for anxiety.    Patient's medical record from [facility] has been reviewed by the rehabilitation admission coordinator and physician.   Past Medical History      Past Medical History:  Diagnosis Date   Diabetes mellitus ([place])     Gestational diabetes      Family History   family history includes Diabetes in her brother, maternal uncle, and mother; Hypertension in her mother; Leukemia in her paternal grandfather and paternal grandmother; Stroke in her maternal grandmother.   Prior Rehab/Hospitalizations Has the patient had prior rehab or hospitalizations prior to  admission? Yes   Has the patient had major surgery during 100 days prior to admission? No               Current Medications   Current Facility-Administered Medications:    acetaminophen (TYLENOL) tablet 650 mg, 650 mg, Oral, Q6H PRN, Buford Dresser, MD, 650 mg at 11/11/20 0640   Chlorhexidine Gluconate Cloth 2 % PADS 6 each, 6 each, Topical, Daily, Buford Dresser, MD, 6 each at 11/11/20 0957   dextrose 50 % solution 0-50 mL, 0-50 mL, Intravenous, PRN, Buford Dresser, MD   feeding supplement (ENSURE ENLIVE / ENSURE PLUS) liquid 237 mL, 237 mL, Oral, BID BM, Pokhrel, Laxman, MD, 237 mL at 11/11/20 6578   HYDROmorphone (DILAUDID) injection 0.5 mg, 0.5 mg, Intravenous, Q4H PRN, Mariel Aloe, MD, 0.5 mg at 11/11/20 1131   hydrOXYzine (ATARAX/VISTARIL) tablet 25 mg, 25 mg, Oral, BID PRN, Rosezetta Schlatter, MD, 25 mg at 11/11/20 4696   hydrOXYzine (ATARAX/VISTARIL) tablet 50 mg, 50 mg, Oral, QHS PRN, Rosezetta Schlatter, MD, 50 mg at 11/10/20 2155   insulin aspart (novoLOG) injection 0-15 Units, 0-15 Units, Subcutaneous, TID WC, Buford Dresser, MD, 2 Units at 11/10/20 1243   insulin aspart (novoLOG) injection 8 Units, 8 Units, Subcutaneous, TID WC, Pokhrel, Laxman, MD   insulin glargine-yfgn (SEMGLEE) injection 32 Units, 32 Units, Subcutaneous, Daily, Pokhrel, Laxman, MD, 32 Units at 11/11/20 0957   insulin starter kit- pen needles (English) 1 kit, 1 kit, Other, Once, Pokhrel, Laxman, MD   living well with diabetes book MISC, , Does not apply, Once, Pokhrel, Laxman, MD   methocarbamol (ROBAXIN) tablet 500 mg, 500 mg, Oral, Q8H PRN, Mariel Aloe, MD, 500 mg at 11/11/20 0341   multivitamin with minerals tablet 1 tablet, 1 tablet, Oral, Daily, Buford Dresser, MD, 1 tablet at 11/11/20 2952   oxyCODONE-acetaminophen (PERCOCET/ROXICET) 5-325 MG per tablet 1-2 tablet, 1-2 tablet, Oral, Q4H PRN, Buford Dresser, MD, 2 tablet at 11/11/20 8413   polyethylene glycol  (MIRALAX / GLYCOLAX) packet 17 g, 17 g, Oral, BID, Mariel Aloe, MD, 17 g at 11/11/20 2440   senna-docusate (Senokot-S) tablet 1 tablet, 1 tablet, Oral, BID, Mariel Aloe, MD, 1 tablet at 11/11/20 1027   sodium chloride flush (NS) 0.9 % injection 10-40 mL, 10-40 mL, Intracatheter, Q12H, Pokhrel, Laxman, MD, 10 mL at 11/10/20 2149   sodium chloride flush (NS) 0.9 % injection 10-40 mL, 10-40 mL, Intracatheter, PRN, Pokhrel, Laxman, MD   vancomycin (VANCOREADY) IVPB 1750 mg/350 mL, 1,750 mg, Intravenous, Q12H, Mariel Aloe, MD, Last Rate: 175 mL/hr at 11/11/20 0931, 1,750 mg at 11/11/20 [phone]  Patients Current Diet:  Diet Order                  Diet - low sodium heart healthy             Diet Carb Modified             Diet heart healthy/carb modified Room service appropriate? Yes; Fluid consistency: Thin  Diet effective now                       Precautions / Restrictions Precautions Precautions: Fall Restrictions Weight Bearing Restrictions: No    Has the patient had 2 or more falls or a fall with injury in the past year? No   Prior Activity Level Community (5-7x/wk): Independent, driving   Prior Functional Level Self Care: Did the patient need help bathing, dressing, using the toilet or eating? Independent   Indoor Mobility: Did the patient need assistance with walking from room to room (with or without device)? Independent   Stairs: Did the patient need assistance with internal or external stairs (with or without device)? Independent   Functional Cognition: Did the patient need help planning regular tasks such as shopping or remembering to take medications? Independent   Home Assistive Devices / Equipment Home Equipment: Crutches   Prior Device Use: Indicate devices/aids used by the patient prior to current illness, exacerbation or injury? None of the above   Current Functional Level Cognition   Overall Cognitive Status: Within Functional Limits for tasks  assessed Orientation Level: Oriented X4    Extremity Assessment (includes Sensation/Coordination)   Upper Extremity Assessment: Defer to OT evaluation  Lower Extremity Assessment: Generalized weakness     ADLs   Overall ADL's : Needs assistance/impaired Eating/Feeding: Independent Grooming: Set up, Sitting Grooming Details (indicate cue type and reason): 2/3 grooming tasks in perched position on stedy at sink level. Lower Body Dressing: Maximal assistance, +2 for physical assistance, +2 for safety/equipment Lower Body Dressing Details (indicate cue type and reason): Patient reports inability to cross LLE over R knee to adjust sock. Max A to adjust footwear bilaterally. Toilet Transfer: Total assistance Toilet Transfer Details (indicate cue type and reason): NT reports transfer to [facility] with use of Stedy. Patient requires assist for hygiene/clothing management. General ADL Comments: Mod to Max A grossly.     Mobility   Overal bed mobility: Needs Assistance Bed Mobility: Supine to Sit, Sit to Supine Supine to sit: Mod assist Sit to supine: Mod assist, +2 for physical assistance General bed mobility comments: Seated in recliner upon entry.     Transfers   Overall transfer level: Needs assistance Equipment used: Rolling walker (2 wheeled) Transfer via Lift Equipment: Stedy Transfers: Sit to/from Stand Sit to Stand: Mod assist, +2 physical assistance, +2 safety/equipment Stand pivot transfers: Min assist, +2 physical assistance, From elevated surface General transfer comment: Declined sit to stand transfers from recliner secondary to pain in bilateral feet and R groin.     Ambulation / Gait / Stairs / Wheelchair Mobility   Ambulation/Gait Ambulation/Gait assistance: Min assist, +2 safety/equipment Gait Distance (Feet): 3 Feet Assistive device: Rolling walker (2 wheeled) Gait Pattern/deviations: Decreased stride length, Shuffle General Gait Details: Side steps toward HOB with RW and  min A of 2 to steady and assist with walker. Gait velocity: decreased     Posture / Balance Dynamic Sitting Balance Sitting balance - Comments: Maintains unsupported sitting position in recliner during BUE HEP. Balance Overall balance assessment: Needs assistance  Sitting-balance support: Feet supported Sitting balance-Leahy Scale: Good Sitting balance - Comments: Maintains unsupported sitting position in recliner during BUE HEP. Standing balance support: Bilateral upper extremity supported, During functional activity Standing balance-Leahy Scale: Poor Standing balance comment: Requiring bil UE support     Special needs/care consideration IV Vanc with LOT 6 weeks. Has midline now but will need PICC for discharge Hgb A1c 12.3    Previous Home Environment  Living Arrangements: Spouse/significant other  Lives With: Spouse (17 and 61 year old children) Available Help at Discharge: Family, Available 24 hours/day Type of Home: House Home Layout: Bed/bath upstairs, 1/2 bath on main level Alternate Level Stairs-Rails: Right Alternate Level Stairs-Number of Steps: 16 Home Access: Stairs to enter Entrance Stairs-Rails: None Entrance Stairs-Number of Steps: 1-2 Bathroom Shower/Tub: Chiropodist: Standard Bathroom Accessibility: Yes How Accessible: Accessible via walker [place]: No   Discharge Living Setting Plans for Discharge Living Setting: Patient's home, Lives with (comment) (fiance and 2 children) Type of Home at Discharge: House Discharge Home Layout: Two level, 1/2 bath on main level, Bed/bath upstairs Alternate Level Stairs-Rails: Right Alternate Level Stairs-Number of Steps: 16 Discharge Home Access: Stairs to enter Entrance Stairs-Rails: None Entrance Stairs-Number of Steps: 1 to 2 Discharge Bathroom Shower/Tub: Tub/shower unit Discharge Bathroom Toilet: Standard Discharge Bathroom Accessibility: Yes How Accessible: Accessible via walker Does  the patient have any problems obtaining your medications?: No   Social/Family/Support Systems Patient Roles: Spouse, Parent Contact Information: [name], fiance Anticipated Caregiver: [name] and her family Anticipated Caregiver's Contact Information: see above Ability/Limitations of Caregiver: none Caregiver Availability: 24/7 Discharge Plan Discussed with Primary Caregiver: Yes Is Caregiver In Agreement with Plan?: Yes Does Caregiver/Family have Issues with Lodging/Transportation while Pt is in Rehab?: No   Goals Patient/Family Goal for Rehab: Mod I to supervision with PT and OT Expected length of stay: ELOS 7 to 10 days Pt/Family Agrees to Admission and willing to participate: Yes Program Orientation Provided & Reviewed with Pt/Caregiver Including Roles  & Responsibilities: Yes   Decrease burden of Care through IP rehab admission: n/a   Possible need for SNF placement upon discharge: not antiicapted   Patient Condition: I have reviewed medical records from [place], spoken with patient and spouse. I met with patient at the bedside for inpatient rehabilitation assessment.  Patient will benefit from ongoing PT and OT, can actively participate in 3 hours of therapy a day 5 days of the week, and can make measurable gains during the admission.  Patient will also benefit from the coordinated team approach during an Inpatient Acute Rehabilitation admission.  The patient will receive intensive therapy as well as Rehabilitation physician, nursing, social worker, and care management interventions.  Due to bladder management, bowel management, safety, skin/wound care, disease management, medication administration, pain management, and patient education the patient requires 24 hour a day rehabilitation nursing.  The patient is currently mod assist overall with mobility and basic ADLs.  Discharge setting and therapy post discharge at home with home health is anticipated.  Patient has agreed to  participate in the Acute Inpatient Rehabilitation Program and will admit today.   Preadmission Screen Completed By:  Cleatrice Burke, 11/11/2020 12:08 PM ______________________________________________________________________   Discussed status with Dr. Naaman Plummer on  11/11/2020 at  1209 and received approval for admission today.   Admission Coordinator:  Cleatrice Burke, RN, time 1209 Date 11/11/2020    Assessment/Plan: Diagnosis: debility related to sepsis Does the need for close, 24 hr/day Medical supervision  in concert with the patient's rehab needs make it unreasonable for this patient to be served in a less intensive setting? Yes Co-Morbidities requiring supervision/potential complications: endocarditis, UTI, DM, myositis Due to bladder management, bowel management, safety, skin/wound care, disease management, medication administration, pain management, and patient education, does the patient require 24 hr/day rehab nursing? Yes Does the patient require coordinated care of a physician, rehab nurse, PT, OT, and SLP to address physical and functional deficits in the context of the above medical diagnosis(es)? Yes Addressing deficits in the following areas: balance, endurance, locomotion, strength, transferring, bowel/bladder control, bathing, dressing, feeding, grooming, toileting, and psychosocial support Can the patient actively participate in an intensive therapy program of at least 3 hrs of therapy 5 days a week? Yes The potential for patient to make measurable gains while on inpatient rehab is excellent Anticipated functional outcomes upon discharge from inpatient rehab: modified independent and supervision PT, modified independent and supervision OT, n/a SLP Estimated rehab length of stay to reach the above functional goals is: 7-10 days Anticipated discharge destination: Home 10. Overall Rehab/Functional Prognosis: excellent     MD Signature: Meredith Staggers, MD,  [place] [facility] 11/11/2020          Revision History                          Note Details  Author Meredith Staggers, MD File Time 11/11/2020 12:27 PM  Author Type Physician Status Signed  Last Editor Meredith Staggers, MD Service Physical Medicine and Rehabilitation

## 2020-11-12 DIAGNOSIS — M60051 Infective myositis, right thigh: Secondary | ICD-10-CM

## 2020-11-12 DIAGNOSIS — M79651 Pain in right thigh: Secondary | ICD-10-CM

## 2020-11-12 DIAGNOSIS — D72829 Elevated white blood cell count, unspecified: Secondary | ICD-10-CM

## 2020-11-12 DIAGNOSIS — I330 Acute and subacute infective endocarditis: Secondary | ICD-10-CM

## 2020-11-12 DIAGNOSIS — D62 Acute posthemorrhagic anemia: Secondary | ICD-10-CM

## 2020-11-12 DIAGNOSIS — E871 Hypo-osmolality and hyponatremia: Secondary | ICD-10-CM

## 2020-11-12 LAB — GLUCOSE, CAPILLARY
Glucose-Capillary: 78 mg/dL (ref 70–99)
Glucose-Capillary: 137 mg/dL — ABNORMAL HIGH (ref 70–99)
Glucose-Capillary: 46 mg/dL — ABNORMAL LOW (ref 70–99)
Glucose-Capillary: 60 mg/dL — ABNORMAL LOW (ref 70–99)
Glucose-Capillary: 72 mg/dL (ref 70–99)
Glucose-Capillary: 81 mg/dL (ref 70–99)
Glucose-Capillary: 128 mg/dL — ABNORMAL HIGH (ref 70–99)

## 2020-11-12 LAB — CULTURE, BLOOD (ROUTINE X 2)
Culture: NO GROWTH
Culture: NO GROWTH
Special Requests: ADEQUATE
Special Requests: ADEQUATE

## 2020-11-12 MED ORDER — INSULIN GLARGINE-YFGN 100 UNIT/ML ~~LOC~~ SOLN
30.0000 [IU] | Freq: Every day | SUBCUTANEOUS | Status: DC
  Administered 2020-11-13 – 2020-11-22 (×10): 30 [IU] via SUBCUTANEOUS
  Filled 2020-11-12 (×11): qty 0.3

## 2020-11-12 MED ORDER — SODIUM CHLORIDE 0.9 % IV SOLN
INTRAVENOUS | Status: DC | PRN
  Administered 2020-11-12 – 2020-11-27 (×9): 250 mL via INTRAVENOUS

## 2020-11-12 MED ORDER — METHOCARBAMOL 500 MG PO TABS
500.0000 mg | ORAL_TABLET | Freq: Four times a day (QID) | ORAL | Status: DC
  Administered 2020-11-12 – 2020-11-21 (×37): 500 mg via ORAL
  Filled 2020-11-12 (×37): qty 1

## 2020-11-12 MED ORDER — OXYCODONE HCL ER 15 MG PO T12A
15.0000 mg | EXTENDED_RELEASE_TABLET | Freq: Two times a day (BID) | ORAL | Status: DC
Start: 2020-11-12 — End: 2020-11-29
  Administered 2020-11-12 – 2020-11-29 (×34): 15 mg via ORAL
  Filled 2020-11-12 (×34): qty 1

## 2020-11-12 NOTE — Progress Notes (Signed)
Physical Therapy Session Note  Patient Details  Name: Chelsea Mathis MRN: [id number] Date of Birth: 05-Apr-1990  Today's Date: 11/12/2020 PT Individual Time: 1700-1740 PT Individual Time Calculation (min): 40 min   Short Term Goals: Week 1:  PT Short Term Goal 1 (Week 1): pt will perform bed mobility with min A consistantly PT Short Term Goal 2 (Week 1): pt will transfer sit<>stand with LRAD and min A PT Short Term Goal 3 (Week 1): pt will ambulate 35f with LRAD and mod A   Skilled Therapeutic Interventions/Progress Updates:  Pt sitting EOB with RN upon entry. Pt agreeable to therapy and requests help donning pants. Fiance present for session. Pt states 5/10 pain in right hip. STS to RW throughout session with MaxA and requiring two attempts with pt counting to three. Therapist assisted in donning pants and balance. Pt extremely limited by pain throughout session.   Stand step transfers bed>wc>mat table>wc with RW ModA same presentation as with gait training.   Gait training 1x10 ft with RW and ModA for steadying and facilitation of weight shifting. Pt demonstrated step-to antalgic gait pattern favoring RLE. Decreased stance time on RLE, decreased step length LLE>RLE, decreased hip and knee flexion RLE>LLE, slightly forward flexed posture with heavy use of BUE support on RW.   ModA EOM>Semi-reclined position on wedge for facilitation of BLE. MaxA semi-reclined >EOM for facilitation of BLE and trunk 2/2 pain/weakness.  Manual therapy x8 min with light effleurage massage to right quad for pain management and passive gentle hip ROM.  Pt completed SAQ LLEx10, Ankle pumps BLE x10. Attempted LLE hip abd/add ROM, however, this increased pain to 7/10. Pt states sitting up is less painful. Returned to chair with mildly decreased in pain by time returned to room.  Frequent cuing throughout session for pursed lipped breathing to minimize holding breath with painful movements.   Pt sitting in wc,  brakes locked, seatbelt alarm on. Call bell within reach and all needs met.   Therapy Documentation Precautions:  Precautions Precautions: Fall Precaution Comments: high levels of pain Restrictions Weight Bearing Restrictions: No  Pain: Pain Assessment Pain Scale: 0-10 Pain Score: 5  Faces Pain Scale: Hurts even more Pain Type: Chronic pain Pain Location: Hip Pain Orientation: Right Pain Descriptors / Indicators: Aching;Sharp Pain Onset: On-going (worse with activity) Pain Intervention(s): Repositioned  Therapy/Group: Individual Therapy  Jaklyn Alen, SPT 11/12/2020, 6:07 PM

## 2020-11-12 NOTE — Progress Notes (Signed)
Hypoglycemic Event  CBG: 46  Treatment: 4 oz juice/soda  Symptoms: Shaky and Nervous/irritable  Follow-up CBG: Time:1425 Follow-up CBG: Time:1440                                 CBG Result:60 CBG Result:70                                                                          Possible Reasons for Event: Inadequate meal intake  Comments/MD notified: No, Charge nurse notified,Dauda RN    [employer]

## 2020-11-12 NOTE — Plan of Care (Signed)
  Problem: Consults Goal: RH GENERAL PATIENT EDUCATION Description: See Patient Education module for education specifics. Outcome: Progressing Goal: Diabetes Guidelines if Diabetic/Glucose > 140 Description: If diabetic or lab glucose is > 140 mg/dl - Initiate Diabetes/Hyperglycemia Guidelines & Document Interventions  Outcome: Progressing   Problem: RH SAFETY Goal: RH STG DECREASED RISK OF FALL WITH ASSISTANCE Description: STG Decreased Risk of Fall With cues and reminders. Outcome: Progressing   Problem: RH PAIN MANAGEMENT Goal: RH STG PAIN MANAGED AT OR BELOW PT'S PAIN GOAL Description: < 3 on a 0-10 pain scale. Outcome: Progressing   Problem: RH KNOWLEDGE DEFICIT GENERAL Goal: RH STG INCREASE KNOWLEDGE OF SELF CARE AFTER HOSPITALIZATION Description: Patient will demonstrate knowledge of medication management, pain management, safety awareness with educational materials and handouts provided by staff independently at discharge. Outcome: Progressing   

## 2020-11-12 NOTE — Evaluation (Signed)
Physical Therapy Assessment and Plan  Patient Details  Name: Chelsea Mathis MRN: [id number] Date of Birth: 09-02-1990  PT Diagnosis: Abnormal posture, Abnormality of gait, Difficulty walking, Muscle weakness, and Pain in RLE, top of L foot,  and bilateral hips Rehab Potential: Good ELOS: 2-3 weeks   Today's Date: 11/12/2020 PT Individual Time: 7741-2878 PT Individual Time Calculation (min): 57 min    Hospital Problem: Principal Problem:   Myositis   Past Medical History:  Past Medical History:  Diagnosis Date   Diabetes mellitus ([place])    Gestational diabetes    Past Surgical History:  Past Surgical History:  Procedure Laterality Date   LAPAROSCOPIC APPENDECTOMY N/A 11/11/2016   Procedure: APPENDECTOMY LAPAROSCOPIC;  Surgeon: Georganna Skeans, MD;  Location: [facility];  Service: General;  Laterality: N/A;   TEE WITHOUT CARDIOVERSION N/A 11/03/2020   Procedure: TRANSESOPHAGEAL ECHOCARDIOGRAM (TEE);  Surgeon: Buford Dresser, MD;  Location[facility] ENDOSCOPY;  Service: Cardiovascular;  Laterality: N/A;    Assessment & Plan Clinical Impression: Patient is a 30 y.o. year old female with history of uncontrolled diabetes mellitus as well as recent UTI.  Per chart review patient lives with significant other.  Reportedly independent prior to admission working for [employer].  Two-level home with 1-2 steps to entry.  Presented 10/31/2020 after patient reportedly was lifting a box much heavier than she thought it was noting right groin pain that increased and wraparound to her hip and low back.  She was seen by urgent care on 10/01/2020 for UTI given nitrofurantoin for 5 days.  On 7/17 she was seen again at urgent care felt to have UTI with hematuria and a muscle spasm in her right hip.  She was given a Toradol injection and a prescription for Augmentin x5 days.  On 7/18 her vaginal panel came back positive for bacterial vaginal infection and was given a prescription for Flagyl.  She again returned to the  ER on 7/24 with right thigh pain of unclear etiology.  X-rays reportedly were completed negative as well as right lower extremity venous Doppler studies negative.  She was given a short course of pain medication(Norco) recommended follow-up with sports medicine.  She returned to the ER again 8/1 with reports of elevated blood sugars swelling to her hands and lower extremities and generalized pain.  She had followed at home prior to this latest arrival and reported generalized weakness.  CT of the right femur and abdomen/pelvis assessed showing low level edema tracking along the superficial fascia of the thigh and some mild infiltrative edema along the deep fascia planes in the thigh.  CT of abdomen pelvis revealed multiple cavitary lesions within the right lower lobe.  Labs noted severe hyponatremia 103 potassium 4.5 chloride 75 glucose 559 BUN 34 creatinine 1.01 alkaline phosphatase 203 albumin 2.1 indirect bilirubin 3.7 WBC 24,900 platelets 175,000 lactic acid 4.6.  She was placed on intravenous fluids and insulin drip in the ER.  Cultures returned showing staph aureus bacteremia/sepsis.  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  Repeat blood cultures 11/07/2020 negative.  Patient was placed on vancomycin x6 weeks through 12/19/2020.  Her acute pulmonary emboli felt to be secondary to septic pulmonary emboli initially placed on heparin later discontinued as she developed anemia as noted lower extremity Dopplers negative pulmonary service was involved no recommendations for ongoing anticoagulation at this time.  Acute on chronic anemia she  was transfused 1 unit packed red blood cells with latest hemoglobin 7.5 and leukocytosis 19,400 improved from 23,800.  She did develop paroxysmal atrial fibrillation requiring amiodarone cardiac rate controlled  amiodarone discontinued.  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  Her initial CK was 586 improved to 177.  Psychiatry was consulted for patient with mixed anxiety depression during hospital stay likely secondary to acute illness and Atarax was added as needed for anxiety.  Therapy evaluations completed due to patient decreased functional mobility was admitted for a comprehensive rehab program  Patient currently requires mod with mobility secondary to muscle weakness, decreased cardiorespiratoy endurance, and decreased standing balance, decreased postural control, and decreased balance strategies.  Prior to hospitalization, patient was independent  with mobility and lived with Significant other, Other (Comment) (lives with fiance and 2 children, ages 53 y/o and 6 y/o) in a House home.  Home access is 1Stairs to enter.  Patient will benefit from skilled PT intervention to maximize safe functional mobility, minimize fall risk, and decrease caregiver burden for planned discharge home with intermittent assist.  Anticipate patient will benefit from follow up [facility] at discharge.  PT - End of Session Activity Tolerance: Tolerates 30+ min activity with multiple rests Endurance Deficit: Yes Endurance Deficit Description: pt required numerous rest breaks PT Assessment Rehab Potential (ACUTE/IP ONLY): Good PT Barriers to Discharge: [place] home environment;Home environment access/layout;Other (comments) PT Barriers to Discharge Comments: 1 STE with 0 rails and 16 steps with R rail to get to main bedroom/bathroom; anxious PT Patient demonstrates impairments in the following area(s): Balance;Edema;Endurance;Motor;Pain PT Transfers Functional Problem(s): Bed Mobility;Bed to Chair;Car;Furniture PT Locomotion Functional Problem(s): Ambulation;Wheelchair Mobility;Stairs PT Plan PT Intensity: Minimum of  1-2 x/day ,45 to 90 minutes PT Frequency: 5 out of 7 days PT Duration Estimated Length of Stay: 2-3 weeks PT Treatment/Interventions: Ambulation/gait training;Community reintegration;DME/adaptive equipment instruction;Neuromuscular re-education;Psychosocial support;Stair training;UE/LE Strength taining/ROM;Wheelchair propulsion/positioning;Balance/vestibular training;Discharge planning;Functional electrical stimulation;Pain management;Skin care/wound management;Therapeutic Activities;UE/LE Coordination activities;Cognitive remediation/compensation;Disease management/prevention;Functional mobility training;Patient/family education;Splinting/orthotics;Therapeutic Exercise PT Transfers Anticipated Outcome(s): supervision with LRAD PT Locomotion Anticipated Outcome(s): supervision with LRAD PT Recommendation Recommendations for Other Services: Therapeutic Recreation consult Therapeutic Recreation Interventions: Stress management Follow Up Recommendations: Home health PT Patient destination: Home Equipment Recommended: To be determined Equipment Details: has none   PT Evaluation Precautions/Restrictions Precautions Precautions: Fall Precaution Comments: high levels of pain Restrictions Weight Bearing Restrictions: No Home Living/Prior Functioning Home Living Type of Home: House Home Access: Stairs to enter Entrance Stairs-Number of Steps: 1 Home Layout: Bed/bath upstairs;1/2 bath on main level;Two level Alternate Level Stairs-Number of Steps: 16 Alternate Level Stairs-Rails: Right Bathroom Shower/Tub: Tub/shower unit (in bathroom on 2nd level) Bathroom Toilet: Standard Additional Comments: Per pt, bathroom on main level is not walker accessible  Lives With: Significant other;Other (Comment) (lives with fiance and 2 children, ages 11 y/o and 57 y/o) Prior Function Level of Independence: Independent with basic ADLs;Independent with transfers;Independent with homemaking with  ambulation;Independent with gait Driving: Yes Cognition Overall Cognitive Status: Within Functional Limits for tasks assessed Arousal/Alertness: Awake/alert Orientation Level: Oriented X4 Memory: Appears intact Awareness: Appears intact Problem Solving: Appears intact Behaviors: Other (comment) (anxious) Safety/Judgment: Appears intact Sensation Sensation Light Touch: Appears Intact Coordination Gross Motor Movements are Fluid and Coordinated: No Fine Motor Movements are Fluid and Coordinated: No Coordination and Movement Description: Affected by pain/swelling, pt with guarded functional LE movements, swelling in Rt LE and B UEs Finger Nose Finger Test: Mild incoordination  in end ranges bilaterally Motor  Motor Motor: Abnormal postural alignment and control Motor - Skilled Clinical Observations: Weakness, deconditioning, decreased balance/postural control, and high pain levels  Trunk/Postural Assessment  Cervical Assessment Cervical Assessment: Within Functional Limits Thoracic Assessment Thoracic Assessment: Exceptions to [facility] (mild kyphosis) Lumbar Assessment Lumbar Assessment: Exceptions to [facility] (posterior pelvic tilt in sitting) Postural Control Postural Control: Deficits on evaluation  Balance Balance Balance Assessed: Yes Static Sitting Balance Static Sitting - Balance Support: Feet supported;Bilateral upper extremity supported Static Sitting - Level of Assistance: 5: Stand by assistance (supervision) Dynamic Sitting Balance Dynamic Sitting - Balance Support: Feet supported;No upper extremity supported Dynamic Sitting - Level of Assistance: 5: Stand by assistance (supervision) Static Standing Balance Static Standing - Balance Support: Bilateral upper extremity supported (RW) Static Standing - Level of Assistance: 4: Min assist Dynamic Standing Balance Dynamic Standing - Balance Support: Bilateral upper extremity supported (RW) Dynamic Standing - Level of Assistance:  3: Mod assist Dynamic Standing - Comments: with transfers only Extremity Assessment  RLE Assessment RLE Assessment: Exceptions to [facility] General Strength Comments: grossly generalized to 3+/5; limited by pain LLE Assessment LLE Assessment: Exceptions to [facility] General Strength Comments: grossly generalized to 3+/5; limited by pain  Care Tool Care Tool Bed Mobility Roll left and right activity   Roll left and right assist level: Minimal Assistance - Patient > 75%    Sit to lying activity        Lying to sitting edge of bed activity   Lying to sitting edge of bed assist level: Moderate Assistance - Patient 50 - 74% (HOB elevated)     Care Tool Transfers Sit to stand transfer   Sit to stand assist level: Maximal Assistance - Patient 25 - 49%    Chair/bed transfer   Chair/bed transfer assist level: Moderate Assistance - Patient 50 - 74%     Toilet transfer   Assist Level: Moderate Assistance - Patient 50 - 74%    Car transfer Car transfer activity did not occur: Safety/medical concerns (fatigue, pain, decreased balance/postural control)        Care Tool Locomotion Ambulation Ambulation activity did not occur: Safety/medical concerns (fatigue, pain, decreased balance/postural control)        Walk 10 feet activity Walk 10 feet activity did not occur: Safety/medical concerns (fatigue, pain, decreased balance/postural control)       Walk 50 feet with 2 turns activity Walk 50 feet with 2 turns activity did not occur: Safety/medical concerns (fatigue, pain, decreased balance/postural control)      Walk 150 feet activity Walk 150 feet activity did not occur: Safety/medical concerns (fatigue, pain, decreased balance/postural control)      Walk 10 feet on uneven surfaces activity Walk 10 feet on uneven surfaces activity did not occur: Safety/medical concerns (fatigue, pain, decreased balance/postural control)      Stairs Stair activity did not occur: Safety/medical concerns  (fatigue, pain, decreased balance/postural control)        Walk up/down 1 step activity Walk up/down 1 step or curb (drop down) activity did not occur: Safety/medical concerns (fatigue, pain, decreased balance/postural control)     Walk up/down 4 steps activity did not occuR: Safety/medical concerns (fatigue, pain, decreased balance/postural control)  Walk up/down 4 steps activity      Walk up/down 12 steps activity Walk up/down 12 steps activity did not occur: Safety/medical concerns (fatigue, pain, decreased balance/postural control)      Pick up small objects from floor Pick up small object from the  floor (from standing position) activity did not occur: Safety/medical concerns (pain, decreased balance/postural control)      Wheelchair Will patient use wheelchair at discharge?: Yes Type of Wheelchair: Manual Wheelchair activity did not occur: Safety/medical concerns (fatigue, pain, decreased balance/postural control)      Wheel 50 feet with 2 turns activity Wheelchair 50 feet with 2 turns activity did not occur: Safety/medical concerns (fatigue, pain, decreased balance/postural control)    Wheel 150 feet activity Wheelchair 150 feet activity did not occur: Safety/medical concerns (fatigue, pain, decreased balance/postural control)      Refer to Care Plan for Long Term Goals  SHORT TERM GOAL WEEK 1 PT Short Term Goal 1 (Week 1): pt will perform bed mobility with min A consistantly PT Short Term Goal 2 (Week 1): pt will transfer sit<>stand with LRAD and min A PT Short Term Goal 3 (Week 1): pt will ambulate 31f with LRAD and mod A  Recommendations for other services: Therapeutic Recreation  Stress management  Skilled Therapeutic Intervention Evaluation completed (see details above and below) with education on PT POC and goals and individual treatment initiated with focus on functional mobility/transfers, generalized strengthening, dynamic standing balance/coordination, toileting,  and improved activity tolerance. Received pt semi-reclined in bed with fiance present, pt educated on PT evaluation, CIR policies, and therapy schedule and agreeable. Pt initially denied any pain but reported significant increase in RLE and bilateral hip pain with any movement (premedicated). Pt also extremely anxious and required increased time, encouragement, and cues for pursed lip breathing technique throughout. O2 sat 100% on 3L therefore decreased to 1L - O2 still 100%. Placed pt on RA for remainder of session with O2 dropping to 91% with mobility (possibly due to high anxiety) but quickly recovered to >95% with rest breaks and cues for breathing. Pt transferred semi-reclined<>sitting EOB with HOB elevated with heavy mod A and use of bedrails and required increased time to scoot hips to EOB. Sit<>stand with RW and max A and transferred bed<>bedside commode with RW and mod A with increased time -pt very tearful and emotional 2/2 pain. Pt able to void and required total A for peri-care in standing. Pt then transferred bedside commode<>recliner with RW and mod A. MD present for morning rounds and extensive discussion had regarding high pain levels. Concluded session with pt sitting in recliner, needs within reach, and chair pad alarm on. Safety plan updated and MD and RN present at bedside. Discussed with RN leaving pt on room air as current O2 sat 96%.   Mobility Bed Mobility Bed Mobility: Rolling Right;Right Sidelying to Sit Rolling Right: Minimal Assistance - Patient > 75% Right Sidelying to Sit: Moderate Assistance - Patient 50-74% (HOB elevated due to pain) Transfers Transfers: Sit to Stand;Stand to Sit;Stand Pivot Transfers Sit to Stand: Maximal Assistance - Patient 25-49% Stand to Sit: Moderate Assistance - Patient 50-74% Stand Pivot Transfers: Moderate Assistance - Patient 50 - 74% Stand Pivot Transfer Details: Verbal cues for technique;Verbal cues for sequencing;Verbal cues for safe use of  DME/AE Stand Pivot Transfer Details (indicate cue type and reason): verbal cues for turning technique, RW safety, and foot placement when stepping Transfer (Assistive device): Rolling walker Locomotion  Gait Ambulation: No Gait Gait: No Stairs / Additional Locomotion Stairs: No Wheelchair Mobility Wheelchair Mobility: No   Discharge Criteria: Patient will be discharged from PT if patient refuses treatment 3 consecutive times without medical reason, if treatment goals not met, if there is a change in medical status, if patient makes no  progress towards goals or if patient is discharged from hospital.  The above assessment, treatment plan, treatment alternatives and goals were discussed and mutually agreed upon: by patient and by family  Alfonse Alpers PT, DPT  11/12/2020, 12:16 PM

## 2020-11-12 NOTE — Progress Notes (Signed)
[place] [facility] PHYSICAL MEDICINE & REHABILITATION PROGRESS NOTE  Subjective/Complaints: Patient seen sitting up in her chair this morning working with therapies.  She states she slept fairly overnight.  Later her fianc presents.  Both patient and fianc with questions regarding pain and thigh, etiology, course.  Prolonged discussion with patient and fianc.  ROS: Right thigh pain. Denies CP, SOB, N/V/D  Objective: Vital Signs: Blood pressure 119/66, pulse (!) 110, temperature 99.6 F (37.6 C), temperature source Oral, resp. rate 17, height 5\' 4"  (1.626 m), weight 95.9 kg, last menstrual period 10/31/2020, SpO2 97 %, unknown if currently breastfeeding. No results found. Recent Labs    11/10/20 0514  WBC 19.4*  HGB 7.5*  HCT 22.8*  PLT 467*   Recent Labs    11/10/20 0514  NA 132*  K 3.8  CL 98  CO2 28  GLUCOSE 127*  BUN <5*  CREATININE 0.39*  CALCIUM 7.7*    Intake/Output Summary (Last 24 hours) at 11/12/2020 1459 Last data filed at 11/12/2020 1330 Gross per 24 hour  Intake 460 ml  Output --  Net 460 ml        Physical Exam: BP 119/66 (BP Location: Left Leg)   Pulse (!) 110   Temp 99.6 F (37.6 C) (Oral)   Resp 17   Ht 5\' 4"  (1.626 m)   Wt 95.9 kg   LMP 10/31/2020   SpO2 97%   BMI 36.29 kg/m  Constitutional: Distressed. Vital signs reviewed. HENT: Normocephalic.  Atraumatic. Eyes: EOMI. No discharge. Cardiovascular: No JVD.  + Tachycardia.  + Murmur. Respiratory: Normal effort.  No stridor.  Bilateral clear to auscultation. GI: Non-distended.  BS +. Skin: Warm and dry.  Intact. Psych: Anxious.  Flat. Musc: Lower extremity edema.  No tenderness in extremities. Neuro: Alert Motor: Limited due to pain, particularly in right lower extremity   Assessment/Plan: 1. Functional deficits which require 3+ hours per day of interdisciplinary therapy in a comprehensive inpatient rehab setting. Physiatrist is providing close team supervision and 24 hour management of  active medical problems listed below. Physiatrist and rehab team continue to assess barriers to discharge/monitor patient progress toward functional and medical goals   Care Tool:  Bathing    Body parts bathed by patient: Right arm, Left arm, Chest, Abdomen, Right upper leg, Left upper leg, Face   Body parts bathed by helper: Front perineal area, Buttocks, Right lower leg, Left lower leg     Bathing assist Assist Level: Moderate Assistance - Patient 50 - 74%     Upper Body Dressing/Undressing Upper body dressing   What is the patient wearing?: Pull over shirt    Upper body assist Assist Level: Set up assist    Lower Body Dressing/Undressing Lower body dressing      What is the patient wearing?: Pants     Lower body assist Assist for lower body dressing: Total Assistance - Patient < 25%     Toileting Toileting Toileting Activity did not occur (Clothing management and hygiene only): N/A (no void or bm)  Toileting assist Assist for toileting: Moderate Assistance - Patient 50 - 74%     Transfers Chair/bed transfer  Transfers assist     Chair/bed transfer assist level: Moderate Assistance - Patient 50 - 74%     Locomotion Ambulation   Ambulation assist   Ambulation activity did not occur: Safety/medical concerns (fatigue, pain, decreased balance/postural control)          Walk 10 feet activity   Assist  Walk 10 feet activity did not occur: Safety/medical concerns (fatigue, pain, decreased balance/postural control)        Walk 50 feet activity   Assist Walk 50 feet with 2 turns activity did not occur: Safety/medical concerns (fatigue, pain, decreased balance/postural control)         Walk 150 feet activity   Assist Walk 150 feet activity did not occur: Safety/medical concerns (fatigue, pain, decreased balance/postural control)         Walk 10 feet on uneven surface  activity   Assist Walk 10 feet on uneven surfaces activity did not  occur: Safety/medical concerns (fatigue, pain, decreased balance/postural control)         Wheelchair     Assist Will patient use wheelchair at discharge?: Yes Type of Wheelchair: Manual Wheelchair activity did not occur: Safety/medical concerns (fatigue, pain, decreased balance/postural control)         Wheelchair 50 feet with 2 turns activity    Assist    Wheelchair 50 feet with 2 turns activity did not occur: Safety/medical concerns (fatigue, pain, decreased balance/postural control)       Wheelchair 150 feet activity     Assist  Wheelchair 150 feet activity did not occur: Safety/medical concerns (fatigue, pain, decreased balance/postural control)        Medical Problem List and Plan: 1.   Debility secondary to severe sepsis/bacteremia due to Staph aureus as well as weakness/pain from myositis  Begin CIR evaluations 2.  Antithrombotics: -DVT/anticoagulation: Venous Doppler studies negative Mechanical:  Antiembolism stockings, knee (TED hose) Bilateral lower extremities Sequential compression devices, below knee Bilateral lower extremities             -antiplatelet therapy: N/A 3. Pain Management: Oxycodone Robaxin scheduled on 8/13             -kpad for muscular pain in legs  OxyContin started on 8/13 4. Mood: Atarax 25 mg twice daily as needed anxiety as well as 50 mg nightly as needed insomnia             -antipsychotic agents: N/A 5. Neuropsych: This patient is capable of making decisions on her  own behalf. 6. Skin/Wound Care: Routine skin checks 7. Fluids/Electrolytes/Nutrition: Routine in and outs 8.  ID/Bacteremia/MRSA endocarditis.  Follow-up infectious disease..  Continue vancomycin through 12/19/2020 9.    Acute pulmonary emboli felt to be secondary to septic pulmonary emboli .initially on heparin which was discontinued as patient developed anemia.  Lower extremity Dopplers negative.  Pulmonary was involved no recommendations for ongoing  anticoagulation at this time 10.  Anemia/leukocytosis.    Hemoglobin 7.5 on 8/11, labs ordered for Monday  WBCs 19.4 on 8/5, labs ordered for Monday 11.  Hyponatremia.  Improved with latest sodium 132 on 8/11, labs ordered for Monday. 12.  Poorly controlled diabetes mellitus.  Hemoglobin A1c 12.3.  NovoLog 8 units 3 times daily, decreased Semglee to 30 units daily on 8/13.  Diabetic teaching             -adjust regimen as needed 13.  Constipation.Miralax BID  LOS: 1 days A FACE TO FACE EVALUATION WAS PERFORMED  Chelsea Mathis 11/12/2020, 2:59 PM

## 2020-11-12 NOTE — Plan of Care (Signed)
  Problem: RH Balance Goal: LTG Patient will maintain dynamic standing with ADLs (OT) Description: LTG:  Patient will maintain dynamic standing balance with assist during activities of daily living (OT)  Flowsheets (Taken 11/12/2020 1227) LTG: Pt will maintain dynamic standing balance during ADLs with: Supervision/Verbal cueing   Problem: Sit to Stand Goal: LTG:  Patient will perform sit to stand in prep for activites of daily living with assistance level (OT) Description: LTG:  Patient will perform sit to stand in prep for activites of daily living with assistance level (OT) Flowsheets (Taken 11/12/2020 1227) LTG: PT will perform sit to stand in prep for activites of daily living with assistance level: Supervision/Verbal cueing   Problem: RH Grooming Goal: LTG Patient will perform grooming w/assist,cues/equip (OT) Description: LTG: Patient will perform grooming with assist, with/without cues using equipment (OT) Flowsheets (Taken 11/12/2020 1227) LTG: Pt will perform grooming with assistance level of: Independent with assistive device    Problem: RH Bathing Goal: LTG Patient will bathe all body parts with assist levels (OT) Description: LTG: Patient will bathe all body parts with assist levels (OT) Flowsheets (Taken 11/12/2020 1227) LTG: Pt will perform bathing with assistance level/cueing: Set up assist    Problem: RH Dressing Goal: LTG Patient will perform upper body dressing (OT) Description: LTG Patient will perform upper body dressing with assist, with/without cues (OT). Flowsheets (Taken 11/12/2020 1227) LTG: Pt will perform upper body dressing with assistance level of: Independent with assistive device Goal: LTG Patient will perform lower body dressing w/assist (OT) Description: LTG: Patient will perform lower body dressing with assist, with/without cues in positioning using equipment (OT) Flowsheets (Taken 11/12/2020 1227) LTG: Pt will perform lower body dressing with assistance  level of: (excluding Teds) Supervision/Verbal cueing   Problem: RH Toileting Goal: LTG Patient will perform toileting task (3/3 steps) with assistance level (OT) Description: LTG: Patient will perform toileting task (3/3 steps) with assistance level (OT)  Flowsheets (Taken 11/12/2020 1227) LTG: Pt will perform toileting task (3/3 steps) with assistance level: Supervision/Verbal cueing   Problem: RH Toilet Transfers Goal: LTG Patient will perform toilet transfers w/assist (OT) Description: LTG: Patient will perform toilet transfers with assist, with/without cues using equipment (OT) Flowsheets (Taken 11/12/2020 1227) LTG: Pt will perform toilet transfers with assistance level of: Supervision/Verbal cueing   Problem: RH Tub/Shower Transfers Goal: LTG Patient will perform tub/shower transfers w/assist (OT) Description: LTG: Patient will perform tub/shower transfers with assist, with/without cues using equipment (OT) Flowsheets (Taken 11/12/2020 1227) LTG: Pt will perform tub/shower stall transfers with assistance level of: Supervision/Verbal cueing

## 2020-11-13 DIAGNOSIS — R7309 Other abnormal glucose: Secondary | ICD-10-CM

## 2020-11-13 LAB — GLUCOSE, CAPILLARY
Glucose-Capillary: 116 mg/dL — ABNORMAL HIGH (ref 70–99)
Glucose-Capillary: 140 mg/dL — ABNORMAL HIGH (ref 70–99)
Glucose-Capillary: 40 mg/dL — CL (ref 70–99)
Glucose-Capillary: 60 mg/dL — ABNORMAL LOW (ref 70–99)
Glucose-Capillary: 61 mg/dL — ABNORMAL LOW (ref 70–99)
Glucose-Capillary: 83 mg/dL (ref 70–99)
Glucose-Capillary: 83 mg/dL (ref 70–99)
Glucose-Capillary: 87 mg/dL (ref 70–99)
Glucose-Capillary: 88 mg/dL (ref 70–99)

## 2020-11-13 MED ORDER — GLUCOSE 40 % PO GEL
ORAL | Status: AC
  Administered 2020-11-13: 31 g
  Filled 2020-11-13: qty 1

## 2020-11-13 NOTE — Progress Notes (Signed)
MD Allena Katz notified of hypoglycemic event that occurred overnight. No new orders received at this time.

## 2020-11-13 NOTE — Progress Notes (Signed)
[facility] PHYSICAL MEDICINE & REHABILITATION PROGRESS NOTE  Subjective/Complaints: Patient seen sitting up in bed this morning.  She states she slept fairly well overnight.  She notes improvement in pain with medication changes.  She states she was able to ambulate 10 feet yesterday with therapies.  ROS: Denies CP, SOB, N/V/D  Objective: Vital Signs: Blood pressure (!) 161/88, pulse (!) 108, temperature 99 F (37.2 C), temperature source Oral, resp. rate 18, height 5\' 4"  (1.626 m), weight 91.5 kg, last menstrual period 10/31/2020, SpO2 95 %, unknown if currently breastfeeding. No results found. No results for input(s): WBC, HGB, HCT, PLT in the last 72 hours.  No results for input(s): NA, K, CL, CO2, GLUCOSE, BUN, CREATININE, CALCIUM in the last 72 hours.   Intake/Output Summary (Last 24 hours) at 11/13/2020 2114 Last data filed at 11/13/2020 1900 Gross per 24 hour  Intake 964.2 ml  Output 2120 ml  Net -1155.8 ml         Physical Exam: BP (!) 161/88 (BP Location: Left Leg)   Pulse (!) 108   Temp 99 F (37.2 C) (Oral)   Resp 18   Ht 5\' 4"  (1.626 m)   Wt 91.5 kg   LMP 10/31/2020   SpO2 95%   BMI 34.63 kg/m  Constitutional: No distress . Vital signs reviewed. HENT: Normocephalic.  Atraumatic. Eyes: EOMI. No discharge. Cardiovascular: No JVD.  Tachycardia.  + Murmur. Respiratory: Normal effort.  No stridor.  Bilateral clear to auscultation. GI: Non-distended.  BS +. Skin: Warm and dry.  Intact. Psych: Flat.  Normal behavior. Musc: Lower extremity edema.  No tenderness in extremities. Neuro: Alert Motor: Left lower extremity: 4 -/5 proximal to distal (pain inhibition) Right lower extremity: Hip flexion, knee extension 2+/5, ankle dorsiflexion 4 -/5 (pain inhibition)  Assessment/Plan: 1. Functional deficits which require 3+ hours per day of interdisciplinary therapy in a comprehensive inpatient rehab setting. Physiatrist is providing close team supervision and 24  hour management of active medical problems listed below. Physiatrist and rehab team continue to assess barriers to discharge/monitor patient progress toward functional and medical goals   Care Tool:  Bathing    Body parts bathed by patient: Right arm, Left arm, Face   Body parts bathed by helper: Front perineal area, Buttocks, Right lower leg, Left lower leg     Bathing assist Assist Level: Moderate Assistance - Patient 50 - 74%     Upper Body Dressing/Undressing Upper body dressing   What is the patient wearing?: Pull over shirt    Upper body assist Assist Level: Moderate Assistance - Patient 50 - 74%    Lower Body Dressing/Undressing Lower body dressing      What is the patient wearing?: Pants     Lower body assist Assist for lower body dressing: Total Assistance - Patient < 25%     Toileting Toileting Toileting Activity did not occur (Clothing management and hygiene only): N/A (no void or bm)  Toileting assist Assist for toileting: Moderate Assistance - Patient 50 - 74%     Transfers Chair/bed transfer  Transfers assist     Chair/bed transfer assist level: Moderate Assistance - Patient 50 - 74%     Locomotion Ambulation   Ambulation assist   Ambulation activity did not occur: Safety/medical concerns (fatigue, pain, decreased balance/postural control)          Walk 10 feet activity   Assist  Walk 10 feet activity did not occur: Safety/medical concerns (fatigue, pain, decreased balance/postural control)  Walk 50 feet activity   Assist Walk 50 feet with 2 turns activity did not occur: Safety/medical concerns (fatigue, pain, decreased balance/postural control)         Walk 150 feet activity   Assist Walk 150 feet activity did not occur: Safety/medical concerns (fatigue, pain, decreased balance/postural control)         Walk 10 feet on uneven surface  activity   Assist Walk 10 feet on uneven surfaces activity did not occur:  Safety/medical concerns (fatigue, pain, decreased balance/postural control)         Wheelchair     Assist Will patient use wheelchair at discharge?: Yes Type of Wheelchair: Manual Wheelchair activity did not occur: Safety/medical concerns (fatigue, pain, decreased balance/postural control)         Wheelchair 50 feet with 2 turns activity    Assist    Wheelchair 50 feet with 2 turns activity did not occur: Safety/medical concerns (fatigue, pain, decreased balance/postural control)       Wheelchair 150 feet activity     Assist  Wheelchair 150 feet activity did not occur: Safety/medical concerns (fatigue, pain, decreased balance/postural control)        Medical Problem List and Plan: 1.   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  Antithrombotics: -DVT/anticoagulation: Venous Doppler studies negative Mechanical:  Antiembolism stockings, knee (TED hose) Bilateral lower extremities Sequential compression devices, below knee Bilateral lower extremities             -antiplatelet therapy: N/A 3. Pain Management: Oxycodone Robaxin scheduled on 8/14             -kpad for muscular pain in legs  OxyContin started on 8/13  Improving 8/14 4. Mood: Atarax 25 mg twice daily as needed anxiety as well as 50 mg nightly as needed insomnia             -antipsychotic agents: N/A 5. Neuropsych: This patient is capable of making decisions on her  own behalf. 6. Skin/Wound Care: Routine skin checks 7. Fluids/Electrolytes/Nutrition: Routine in and outs 8.  ID/Bacteremia/MRSA endocarditis.  Follow-up infectious disease..  Continue vancomycin through 12/19/2020 9.    Acute pulmonary emboli felt to be secondary to septic pulmonary emboli .initially on heparin which was discontinued as patient developed anemia.  Lower extremity Dopplers negative.  Pulmonary was involved no recommendations for ongoing  anticoagulation at this time 10.  Anemia/leukocytosis.    Hemoglobin 7.5 on 8/11, labs ordered for tomorrow  WBCs 19.4 on 8/5, labs ordered for tomorrow 11.  Hyponatremia.  Improved with latest sodium 132 on 8/11, labs ordered for tomorrow 12.  Poorly controlled diabetes mellitus.  Hemoglobin A1c 12.3.   NovoLog 8 units 3 times daily, d/ced on 8/14 Semglee to 30 units daily on 8/13.  Diabetic teaching             -adjust regimen as needed  Labile on 8/14 with hypoglycemia 13.  Constipation.Miralax BID  LOS: 2 days A FACE TO FACE EVALUATION WAS PERFORMED  Zephaniah Lubrano Karis Juba 11/13/2020, 9:14 PM

## 2020-11-13 NOTE — Progress Notes (Signed)
Pharmacy Antibiotic Note  Chelsea Mathis is a 30 y.o. 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  Pharmacy has been consulted for vancomycin dosing. Last vancomycin peak/trough drawn 8/11, AUC = 494 within goal range. Current dose was continued. No new labs since 8/11 (weekly CBC, cmet on Mondays). Afebrile.   Plan: Continue Vancomycin 1750 mg IV q12h, end date 12/19/20 Follow up lab results on Monday Consider weekly vanc levels  Monitor clinical progress, renal function  Height: 5\' 4"  (162.6 cm) Weight: 91.5 kg (201 lb 11.5 oz) IBW/kg (Calculated) : 54.7  Temp (24hrs), Avg:98.8 F (37.1 C), Min:98 F (36.7 C), Max:99.6 F (37.6 C)  Recent Labs  Lab 11/06/20 1634 11/06/20 1907 11/07/20 0455 11/07/20 1137 11/08/20 0250 11/08/20 0810 11/09/20 0354 11/10/20 0514 11/10/20 1424 11/10/20 2153  WBC 40.2*  --  38.5*  --  28.4*  --  23.8* 19.4*  --   --   CREATININE  --   --   --   --   --  0.40* 0.37* 0.39*  --   --   VANCOTROUGH  --   --  8*  --   --   --   --   --   --  11*  VANCOPEAK  --  38  --   --   --   --   --   --  32  --   VANCORANDOM  --   --   --  16  --   --   --   --   --   --     Estimated Creatinine Clearance: 110.6 mL/min (A) (by C-G formula based on SCr of 0.39 mg/dL (L)).    No Known Allergies  Antimicrobials this admission: Vanc 8/1 >> (12/19/20) Zosyn 8/1 >> 8/2 Clinda x 1 8/1 Cefepime 8/6 >> 8/9 Mupirocin nasal 8/3 >> 8/9   Microbiology results: 8/1 BCx: 2/2 MRSA 8/1 urine: multiple species 8/1 COVID and flu: negative 8/2 BCID: MRSA 8/2 blood: negative 8/5 blood: MRSA, 2nd set neg 8/6 blood: MRSA, 2nd set neg 8/8 blood: ng x 3 days to date 8/1 MRSA PCR: positive  Thank you for allowing pharmacy to be a part of this patient's care.  10/1, Pharm.D. PGY-1 Ambulatory Care Resident Phone:[phone] 11/13/2020 10:45 AM

## 2020-11-13 NOTE — Progress Notes (Signed)
Hypoglycemic Event  CBG: 61  Treatment: 4 oz juice/soda  Symptoms: Shaky  Follow-up CBG: Time:0115 CBG Result:83  Possible Reasons for Event: Unknown  Comments/MD notified:Will notify MD on morning rounds    Celicia Minahan, Sylvie Farrier

## 2020-11-13 NOTE — Progress Notes (Signed)
Hypoglycemic Event  CBG: 40  Treatment: 8 oz juice/soda Treatment: Glucose Gel (1) Symptoms: Shaky  Follow-up CBG: Time:1440 CBG Result:60 Follow-up CBG: Time:1455 CBG Result:83  Possible Reasons for Event: Unknown  Comments/MD notified: Patel. MD notified to D/C meal time coverage    [facility]

## 2020-11-14 LAB — CBC WITH DIFFERENTIAL/PLATELET
Abs Immature Granulocytes: 0 10*3/uL (ref 0.00–0.07)
Basophils Absolute: 0 10*3/uL (ref 0.0–0.1)
Basophils Relative: 0 %
Eosinophils Absolute: 0 10*3/uL (ref 0.0–0.5)
Eosinophils Relative: 0 %
HCT: 26.8 % — ABNORMAL LOW (ref 36.0–46.0)
Hemoglobin: 8.3 g/dL — ABNORMAL LOW (ref 12.0–15.0)
Lymphocytes Relative: 7 %
Lymphs Abs: 0.9 10*3/uL (ref 0.7–4.0)
MCH: 26.1 pg (ref 26.0–34.0)
MCHC: 31 g/dL (ref 30.0–36.0)
MCV: 84.3 fL (ref 80.0–100.0)
Monocytes Absolute: 0.5 10*3/uL (ref 0.1–1.0)
Monocytes Relative: 4 %
Neutro Abs: 11.7 10*3/uL — ABNORMAL HIGH (ref 1.7–7.7)
Neutrophils Relative %: 89 %
Platelets: 549 10*3/uL — ABNORMAL HIGH (ref 150–400)
RBC: 3.18 MIL/uL — ABNORMAL LOW (ref 3.87–5.11)
RDW: 17.2 % — ABNORMAL HIGH (ref 11.5–15.5)
WBC: 13.1 10*3/uL — ABNORMAL HIGH (ref 4.0–10.5)
nRBC: 0 /100 WBC
nRBC: 0.2 % (ref 0.0–0.2)

## 2020-11-14 LAB — COMPREHENSIVE METABOLIC PANEL
ALT: 13 U/L (ref 0–44)
AST: 25 U/L (ref 15–41)
Albumin: 1.9 g/dL — ABNORMAL LOW (ref 3.5–5.0)
Alkaline Phosphatase: 126 U/L (ref 38–126)
Anion gap: 9 (ref 5–15)
BUN: <5 mg/dL — ABNORMAL LOW (ref 6–20)
CO2: 27 mmol/L (ref 22–32)
Calcium: 8.8 mg/dL — ABNORMAL LOW (ref 8.9–10.3)
Chloride: 96 mmol/L — ABNORMAL LOW (ref 98–111)
Creatinine, Ser: 0.46 mg/dL (ref 0.44–1.00)
GFR, Estimated: >60 mL/min (ref >60)
Glucose, Bld: 132 mg/dL — ABNORMAL HIGH (ref 70–99)
Potassium: 4.1 mmol/L (ref 3.5–5.1)
Sodium: 132 mmol/L — ABNORMAL LOW (ref 135–145)
Total Bilirubin: 0.5 mg/dL (ref 0.3–1.2)
Total Protein: 8.3 g/dL — ABNORMAL HIGH (ref 6.5–8.1)

## 2020-11-14 LAB — GLUCOSE, CAPILLARY
Glucose-Capillary: 74 mg/dL (ref 70–99)
Glucose-Capillary: 144 mg/dL — ABNORMAL HIGH (ref 70–99)
Glucose-Capillary: 112 mg/dL — ABNORMAL HIGH (ref 70–99)
Glucose-Capillary: 160 mg/dL — ABNORMAL HIGH (ref 70–99)
Glucose-Capillary: 170 mg/dL — ABNORMAL HIGH (ref 70–99)

## 2020-11-14 MED ORDER — GABAPENTIN 600 MG PO TABS
300.0000 mg | ORAL_TABLET | Freq: Three times a day (TID) | ORAL | Status: DC
  Administered 2020-11-14 – 2020-11-29 (×45): 300 mg via ORAL
  Filled 2020-11-14 (×45): qty 1

## 2020-11-14 MED ORDER — SODIUM CHLORIDE 0.9% FLUSH: 10.0000 mL | INTRAVENOUS | Status: DC | PRN

## 2020-11-14 MED ORDER — GERHARDT'S BUTT CREAM
TOPICAL_CREAM | Freq: Two times a day (BID) | CUTANEOUS | Status: DC
  Filled 2020-11-14: qty 1

## 2020-11-14 MED ORDER — LIVING WELL WITH DIABETES BOOK
Freq: Once | Status: AC
  Filled 2020-11-14: qty 1

## 2020-11-14 NOTE — Progress Notes (Signed)
[facility] PHYSICAL MEDICINE & REHABILITATION PROGRESS NOTE  Subjective/Complaints: Complains of burning and throbbing leg pain- willing to try Gabapentin BP and HR elevated.  C/o swelling and warmth in LUE  ROS: Denies CP, SOB, N/V/D, +burning and throbbing leg pain  Objective: Vital Signs: Blood pressure (!) 142/90, pulse (!) 105, temperature 99.6 F (37.6 C), temperature source Oral, resp. rate 16, height 5\' 4"  (1.626 m), weight 90.8 kg, last menstrual period 10/31/2020, SpO2 95 %, unknown if currently breastfeeding. No results found. Recent Labs    11/14/20 1251  WBC 13.1*  HGB 8.3*  HCT 26.8*  PLT 549*   Recent Labs    11/14/20 1251  NA 132*  K 4.1  CL 96*  CO2 27  GLUCOSE 132*  BUN <5*  CREATININE 0.46  CALCIUM 8.8*    Intake/Output Summary (Last 24 hours) at 11/14/2020 1413 Last data filed at 11/14/2020 1316 Gross per 24 hour  Intake 560 ml  Output 1550 ml  Net -990 ml        Physical Exam: BP (!) 142/90 (BP Location: Left Leg)   Pulse (!) 105   Temp 99.6 F (37.6 C) (Oral)   Resp 16   Ht 5\' 4"  (1.626 m)   Wt 90.8 kg   LMP 10/31/2020   SpO2 95%   BMI 34.36 kg/m  Constitutional: No distress . Vital signs reviewed, BMI 34.36 HENT: Normocephalic.  Atraumatic. Eyes: EOMI. No discharge. Cardiovascular: No JVD.  Tachycardia.  + Murmur. Respiratory: Normal effort.  No stridor.  Bilateral clear to auscultation. GI: Non-distended.  BS +. Skin: Warm and dry.  Intact. Psych: Flat.  Normal behavior. Musc: Lower extremity edema.  No tenderness in extremities. Neuro: Alert Motor: Left lower extremity: 4 -/5 proximal to distal (pain inhibition) Right lower extremity: Hip flexion, knee extension 2+/5, ankle dorsiflexion 4 -/5 (pain inhibition)  Assessment/Plan: 1. Functional deficits which require 3+ hours per day of interdisciplinary therapy in a comprehensive inpatient rehab setting. Physiatrist is providing close team supervision and 24 hour  management of active medical problems listed below. Physiatrist and rehab team continue to assess barriers to discharge/monitor patient progress toward functional and medical goals   Care Tool:  Bathing    Body parts bathed by patient: Right arm, Left arm, Chest, Abdomen, Face   Body parts bathed by helper: Front perineal area, Buttocks, Right lower leg, Left lower leg     Bathing assist Assist Level: Set up assist (unable to stand to wash lower body)     Upper Body Dressing/Undressing Upper body dressing   What is the patient wearing?: Hospital gown only    Upper body assist Assist Level: Minimal Assistance - Patient > 75%    Lower Body Dressing/Undressing Lower body dressing    Lower body dressing activity did not occur: Safety/medical concerns What is the patient wearing?: Pants     Lower body assist Assist for lower body dressing:  (unable to stand with Stedy and +2)     Toileting Toileting Toileting Activity did not occur (Clothing management and hygiene only): N/A (no void or bm)  Toileting assist Assist for toileting: Moderate Assistance - Patient 50 - 74%     Transfers Chair/bed transfer  Transfers assist     Chair/bed transfer assist level: Minimal Assistance - Patient > 75% (Stand pivot, RW)     Locomotion Ambulation   Ambulation assist   Ambulation activity did not occur: Safety/medical concerns (fatigue, pain, decreased balance/postural control)  Walk 10 feet activity   Assist  Walk 10 feet activity did not occur: Safety/medical concerns (fatigue, pain, decreased balance/postural control)        Walk 50 feet activity   Assist Walk 50 feet with 2 turns activity did not occur: Safety/medical concerns (fatigue, pain, decreased balance/postural control)         Walk 150 feet activity   Assist Walk 150 feet activity did not occur: Safety/medical concerns (fatigue, pain, decreased balance/postural control)          Walk 10 feet on uneven surface  activity   Assist Walk 10 feet on uneven surfaces activity did not occur: Safety/medical concerns (fatigue, pain, decreased balance/postural control)         Wheelchair     Assist Will patient use wheelchair at discharge?: Yes Type of Wheelchair: Manual Wheelchair activity did not occur: Safety/medical concerns (fatigue, pain, decreased balance/postural control)  Wheelchair assist level: Supervision/Verbal cueing Max wheelchair distance: 54'    Wheelchair 50 feet with 2 turns activity    Assist    Wheelchair 50 feet with 2 turns activity did not occur: Safety/medical concerns (fatigue, pain, decreased balance/postural control)   Assist Level: Supervision/Verbal cueing   Wheelchair 150 feet activity     Assist  Wheelchair 150 feet activity did not occur: Safety/medical concerns (fatigue, pain, decreased balance/postural control)        Medical Problem List and Plan: 1.   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  Antithrombotics: -DVT/anticoagulation: Venous Doppler studies negative Mechanical:  Antiembolism stockings, knee (TED hose) Bilateral lower extremities Sequential compression devices, below knee Bilateral lower extremities             -antiplatelet therapy: N/A 3. Lower extremity pain: Oxycodone Robaxin scheduled on 8/14             -kpad for muscular pain in legs  OxyContin started on 8/13  Start gabapentin 300mg  TID 4. Anxiety: continue Atarax 25 mg twice daily as needed anxiety as well as 50 mg nightly as needed insomnia             -antipsychotic agents: N/A 5. Neuropsych: This patient is capable of making decisions on her  own behalf. 6. LUE IV infiltration:  Placed nursing order for warm compress 15 minutes TID 7. Fluids/Electrolytes/Nutrition: Routine in and outs 8.  ID/Bacteremia/MRSA endocarditis.  Follow-up infectious  disease..  Continue vancomycin through 12/19/2020 9.    Acute pulmonary emboli felt to be secondary to septic pulmonary emboli .initially on heparin which was discontinued as patient developed anemia.  Lower extremity Dopplers negative.  Pulmonary was involved no recommendations for ongoing anticoagulation at this time 10.  Anemia/leukocytosis.    Hemoglobin 7.5 on 8/11, labs ordered for tomorrow  WBCs 19.4 on 8/5, labs ordered for tomorrow 11.  Hyponatremia.  Improved with latest sodium 132 on 8/11, labs ordered for tomorrow 12.  Poorly controlled diabetes mellitus.  Hemoglobin A1c 12.3.   NovoLog 8 units 3 times daily, d/ced on 8/14 Semglee to 30 units daily on 8/13.  Diabetic teaching             -adjust regimen as needed  Labile on 8/14 with hypoglycemia 13.  Constipation.Miralax BID  LOS: 3 days A FACE TO FACE EVALUATION WAS PERFORMED  9/14 Arnitra Sokoloski 11/14/2020, 2:13 PM

## 2020-11-14 NOTE — Progress Notes (Signed)
Inpatient Rehabilitation  Patient information reviewed and entered into eRehab system by Dezarae Mcclaran Josue Kass, OTR/L.   Information including medical coding, functional ability and quality indicators will be reviewed and updated through discharge.    

## 2020-11-14 NOTE — Progress Notes (Signed)
Inpatient Rehabilitation Care Coordinator Assessment and Plan Patient Details  Name: Chelsea Mathis MRN: [id number] Date of Birth: [birth year]  Today's Date: 11/14/2020  Hospital Problems: Principal Problem:   Myositis Active Problems:   Acute blood loss anemia   Leukocytosis   Hyponatremia   Acute bacterial endocarditis   Pain of right thigh   Labile blood glucose  Past Medical History:  Past Medical History:  Diagnosis Date   Diabetes mellitus (HCC)    Gestational diabetes    Past Surgical History:  Past Surgical History:  Procedure Laterality Date   LAPAROSCOPIC APPENDECTOMY N/A 11/11/2016   Procedure: APPENDECTOMY LAPAROSCOPIC;  Surgeon: Violeta Gelinas, MD;  Location: [facility] OR;  Service: General;  Laterality: N/A;   TEE WITHOUT CARDIOVERSION N/A 11/03/2020   Procedure: TRANSESOPHAGEAL ECHOCARDIOGRAM (TEE);  Surgeon: Jodelle Red, MD;  Location: [facility] ENDOSCOPY;  Service: Cardiovascular;  Laterality: N/A;   Social History:  reports that she has never smoked. She has never used smokeless tobacco. She reports that she does not drink alcohol and does not use drugs.  Family / Support Systems Marital Status: Single Patient Roles: Partner, Parent Spouse/Significant Other: [name]-fiance [phone] Children: 5 & 19 yo Other Supports: Extended family Anticipated Caregiver: [name] and family Ability/Limitations of Caregiver: None Caregiver Availability: 24/7 Family Dynamics: Close knit family who are involved and will make sure she has what she needs at discharge. 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 Pt and fiance are wiling to do what is needed for her to progress and get home Recent Psychosocial Issues: thought she was healthy before this happened Psychiatric History: Psychiatry sqaw while on acute and assessed her. Has history of depression/anixety and is taking meds now for this which she finds helpful. Would benefit from neuro-psych and or psychiatry follow while here for support, medicaiton adjustment and coping Substance Abuse History: No issues  Patient / Family Perceptions, Expectations & Goals Pt/Family understanding of illness & functional limitations: Pt and [name] can explain her spesis and treatment plan going forward. Both talk with the MD and feel they have a good understanding of her plan and needs. Both are hopeful she will do well here Premorbid pt/family roles/activities: mom, fiance, daughter, employee, etc Anticipated changes in roles/activities/participation: resume Pt/family expectations/goals: Pt states: " I want to take care of myself but hope my pain is better to be able to do more in therapies."  [name] states: " I will do whatever she needs me too, but hope her pain gets better."  [employer]: None Premorbid Home Care/DME Agencies: None Transportation available at discharge: [employer] referrals recommended: Neuropsychology  Discharge Planning Living Arrangements: Spouse/significant other, Children Support Systems: Spouse/significant other, Children, Parent, Other relatives, Friends/neighbors Type of Residence: Private residence Insurance Resources: [employer] (specify  [place]ounty) Architect: Employment, Garment/textile technologist Screen Referred: No Living Expenses: Psychologist, sport and exercise Management: Patient, Significant  Other Does the patient have any problems obtaining your medications?: No (Does not have a PCP) Home Management: Pt now family will assist with Patient/Family Preliminary Plans: Return home with [name] and their children. Other family members to assist while [name] works, pt will have 24/7 care at discharge. her pain seems to limit her in therapies, hopefully each day it will get better and she will be able to participate fully in therapies. Care Coordinator Barriers to Discharge: Insurance for SNF coverage Care Coordinator Anticipated Follow Up Needs: HH/OP  Clinical Impression Pleasant female who is willing to work in therapies but hopes her pain is better than she is having now. Her fiance-[name] is very involved and is here and will be here daily with her. Will work on discharge needs and see if psychiatry will continue to see of will place on neuro-psych list  [name] 11/14/2020, 9:47 AM

## 2020-11-14 NOTE — Progress Notes (Signed)
Occupational Therapy Session Note  Patient Details  Name: Chelsea Mathis MRN: [id number] Date of Birth: 09-Mar-1991  Today's Date: 11/14/2020 OT Individual Time: 4098-1191 OT Individual Time Calculation (min): 60 min    Short Term Goals: Week 1:  OT Short Term Goal 1 (Week 1): Pt will complete LB self care at sit<stand level during 2 consecutive OT sessions as evidence of improved pain mgt OT Short Term Goal 2 (Week 1): Pt will complete 1 grooming task while standing at the sink to increase standing endurance OT Short Term Goal 3 (Week 1): Pt will complete 1/3 components of donning pants using AE as needed  Skilled Therapeutic Interventions/Progress Updates:    Treatment session with focus on self-care retraining, pain management, and breathing techniques.  Pt received upright in w/c reporting increased pain, RN notified.  Pt agreeable to self-care retraining with bathing and dressing at sink side. Therapist positioned pt at sink total assist in w/c and pt then able to engage in UB bathing and dressing tasks after setup of items.  Pt with frequent c/o pain in R thigh and LLE and reports feeling of tightness along LLE.  Attempted to complete sit > stand with RW then transitioned to attempt in [place] with +2 with pt unable to come into full upright standing despite +2 in [place] due to pain in BLE.  Pt with difficulty even clearing buttocks from w/c despite +2 assist.  RN aware of current situation.  Therapist educating on diaphragmatic breathing to work through pain and anxiety.  Pt remained upright in w/c with seat belt alarm on and all needs in reach.  Therapy Documentation Precautions:  Precautions Precautions: Fall Precaution Comments: high levels of pain Restrictions Weight Bearing Restrictions: No  Pain: Pain Assessment Pain Scale: 0-10 Pain Score: 9 RN administered pain meds during session.   Therapy/Group: Individual Therapy  Rosalio Loud 11/14/2020, 12:21 PM

## 2020-11-14 NOTE — Progress Notes (Signed)
[facility] Individual Statement of Services  Patient Name:  Chelsea Mathis  Date:  11/14/2020  Welcome to the [facility]patient [facility].  Our goal is to provide you with an individualized program based on your diagnosis and situation, designed to meet your specific needs.  With this comprehensive rehabilitation program, you will be expected to participate in at least 3 hours of rehabilitation therapies Monday-Friday, with modified therapy programming on the weekends.  Your rehabilitation program will include the following services:  Physical Therapy (PT), Occupational Therapy (OT), 24 hour per day rehabilitation nursing, Therapeutic Recreaction (TR), Neuropsychology, Care Coordinator, Rehabilitation Medicine, Nutrition Services, and Pharmacy Services  Weekly team conferences will be held on Tuesda to discuss your progress.  Your Inpatient Rehabilitation Care Coordinator will talk with you frequently to get your input and to update you on team discussions.  Team conferences with you and your family in attendance may also be held.  Expected length of stay: 2-3 weeks  Overall anticipated outcome: supervision-CGA level  Depending on your progress and recovery, your program may change. Your Inpatient Rehabilitation Care Coordinator will coordinate services and will keep you informed of any changes. Your Inpatient Rehabilitation Care Coordinator's name and contact numbers are listed  below.  The following services may also be recommended but are not provided by the Inpatient Rehabilitation Center:  Driving Evaluations Home Health Rehabiltiation Services Outpatient Rehabilitation Services Vocational Rehabilitation   Arrangements will be made to provide these services after discharge if needed.  Arrangements include referral to agencies that provide these services.  Your insurance has been verified to be:  [employer] medicaid Your primary doctor is:  Needs to see who is  listed on her Medicaid card-was not seeing one PTA  Pertinent information will be shared with your doctor and your insurance company.  Inpatient Rehabilitation Care Coordinator:  Dossie Der, [place] [phone] or Luna [place]  Information discussed with and copy given to patient by: [name], 11/14/2020, 9:49 AM

## 2020-11-14 NOTE — Progress Notes (Signed)
Occupational Therapy Session Note  Patient Details  Name: Chelsea Mathis MRN: [id number] Date of Birth: [birth year]  Today's Date: 11/14/2020 OT Individual Time: 1405-1450 OT Individual Time Calculation (min): 45 min    Short Term Goals: Week 1:  OT Short Term Goal 1 (Week 1): Pt will complete LB self care at sit<stand level during 2 consecutive OT sessions as evidence of improved pain mgt OT Short Term Goal 2 (Week 1): Pt will complete 1 grooming task while standing at the sink to increase standing endurance OT Short Term Goal 3 (Week 1): Pt will complete 1/3 components of donning pants using AE as needed  Skilled Therapeutic Interventions/Progress Updates:    Patient seated in w/c, states that she is having pain in both hips, groin, thighs and radiating to knee area.  She notes that she has been sitting since this morning.  Reviewed pain management and practiced w/c level repositioning and stretching activities - she completed them with cues and assist to initiate but was tearful t/o due to ongoing pain.  She states that she would like to attempt to void on commode - commode set up and adjusted to facilitate sit to stand.  Attempt to stand from w/c to walker was unsuccessful, positioned feet on stedy and attempted to stand to stedy but unable.  Utilized [employer] w/c to bed with mod A - she had pain with board placement and moving laterally but was able to safely move to bed surface in this manner without resisting.  Sit to supine with max A for bilateral legs.  She remained in bed - utilizing heating pan in attempt to manage pain - nursing aware of patients ongoing pain and need to use bed pan.  Bed alarm set and call bell in hand.    Therapy Documentation Precautions:  Precautions Precautions: Fall Precaution Comments: high levels of pain Restrictions Weight Bearing Restrictions: No  Therapy/Group: Individual Therapy  Barrie Lyme 11/14/2020, 7:47 AM

## 2020-11-14 NOTE — Progress Notes (Signed)
Patient ID: Chelsea Mathis, female   DOB: [birth year], 30 y.o.   MRN: [id number] Met with the patient and significant other to introduce self and the role of the nurse CM. Discussed current situation with DM ( A1c 12.3) , pain, infection IIV abx through 12/19/20), insulin  (Semglee 30 units/day) and CMM diet. Patient noted insulin administration covered with her and tips for carb counting and heart healthy diet given to patient with recommendations for added protein and eating as CBGs are running on the low side. Patient reported lots of pain in hip and leg even with pain medication and muscle relaxer on board. Patient given handouts for HH/CMM/protein rich and limit salt diet. Continue to follow along to discharge to address educational needs. Collaborate with the team and SW to facilitate preparation for discharge. Margarito Liner

## 2020-11-14 NOTE — Progress Notes (Signed)
Physical Therapy Session Note  Patient Details  Name: Chelsea Mathis MRN: [id number] Date of Birth: 08/06/1990  Today's Date: 11/14/2020 PT Individual Time: 0802-0925 PT Individual Time Calculation (min): 83 min   Short Term Goals: Week 1:  PT Short Term Goal 1 (Week 1): pt will perform bed mobility with min A consistantly PT Short Term Goal 2 (Week 1): pt will transfer sit<>stand with LRAD and min A PT Short Term Goal 3 (Week 1): pt will ambulate 41ft with LRAD and mod A  Skilled Therapeutic Interventions/Progress Updates:  Pt received supine in bed wearing O2 on 1L, reported 8/10 pain in R hip. Pt had received meds prior to session, nursing notified to inquire if pt could receive more. Emphasis of session on transfers and improved weight bearing tolerance of RLE. Removed nasal cannula and pt performed supine <>sit EOB w/min A for RLE management and increased time. Verbal cues for diaphragmatic breathing to avoid valsalva and hand placement. Pt very vocal about pain and moved very slowly. Donned pt's ted hose at EOB w/max A slowly, as this was painful for pt. Pt performed sit <> stand and stand pivot w/RW from EOB to [facility] w/min A, SpO2 at 97%. Pt voided continently and performed sit <> stand w/min A for stabilization of AD. Total A for peri care in standing and pt ambulated 5' to [facility] w/RW and min A while crying in pain. Encouraged holistic pain management strategies and deep breathing. Nursing arrived to administer more pain medication and therapist provided ice pack for R hip. Pt self-propelled in WC 75' w/supervision to day gym. Attempted sit <>stand in gym, but pt unable 2/2 pain. Educated pt on positional changes for pain relief, importance of positional changes, and exercises to perform for improved ROM (LAQs, ankle pumps, marches). Pt self-propelled 75' back to room w/supervision and was left sitting in [facility] w/all needs in reach, pain at 9/10 and ice pack on R hip. Nursing notified.   Therapy  Documentation Precautions:  Precautions Precautions: Fall Precaution Comments: high levels of pain Restrictions Weight Bearing Restrictions: No   Therapy/Group: Individual Therapy Jill Alexanders Nobuko Gsell, PT, DPT  11/14/2020, 7:38 AM

## 2020-11-14 NOTE — IPOC Note (Signed)
Overall Plan of Care [facility]) Patient Details Name: Chelsea Mathis MRN: [id number] DOB: 1990/10/01  Admitting Diagnosis: Myositis  Hospital Problems: Principal Problem:   Myositis Active Problems:   Acute blood loss anemia   Leukocytosis   Hyponatremia   Acute bacterial endocarditis   Pain of right thigh   Labile blood glucose     Functional Problem List: Nursing Endurance, Medication Management, Pain, Safety, Perception, Edema  PT Balance, Edema, Endurance, Motor, Pain  OT Balance, Edema, Endurance, Motor, Pain, Skin Integrity  SLP    TR         Basic ADL's: OT Grooming, Bathing, Dressing, Toileting     Advanced  ADL's: OT Simple Meal Preparation, Laundry     Transfers: PT Bed Mobility, Bed to Chair, Car, Occupational psychologist, Research scientist (life sciences): PT Ambulation, Psychologist, prison and probation services, Stairs     Additional Impairments: OT None  SLP        TR      Anticipated Outcomes Item Anticipated Outcome  Self Feeding No goal  Swallowing      Basic self-care  Supervision-Mod I  Programme researcher, broadcasting/film/video  n/a  Transfers  supervision with LRAD  Locomotion  supervision with LRAD  Communication     Cognition     Pain  < 3  Safety/Judgment  supervision and no falls   Therapy Plan: PT Intensity: Minimum of 1-2 x/day ,45 to 90 minutes PT Frequency: 5 out of 7 days PT Duration Estimated Length of Stay: 2-3 weeks OT Intensity: Minimum of 1-2 x/day, 45 to 90 minutes OT Frequency: 5 out of 7 days OT Duration/Estimated Length of Stay: 2-3 weeks     Due to the current state of emergency, patients may not be receiving their 3-hours of Medicare-mandated therapy.   Team Interventions: Nursing Interventions Patient/Family Education, Disease Management/Prevention, Pain Management, Medication Management, Discharge Planning  PT interventions Ambulation/gait training, Community reintegration, DME/adaptive equipment  instruction, Neuromuscular re-education, Psychosocial support, Stair training, UE/LE Strength taining/ROM, Wheelchair propulsion/positioning, Warden/ranger, Discharge planning, Functional electrical stimulation, Pain management, Skin care/wound management, Therapeutic Activities, UE/LE Coordination activities, Cognitive remediation/compensation, Disease management/prevention, Functional mobility training, Patient/family education, Splinting/orthotics, Therapeutic Exercise  OT Interventions Warden/ranger, DME/adaptive equipment instruction, Patient/family education, Therapeutic Activities, Wheelchair propulsion/positioning, Functional electrical stimulation, Psychosocial support, Therapeutic Exercise, UE/LE Strength taining/ROM, Self Care/advanced ADL retraining, Functional mobility training, Community reintegration, Discharge planning, Skin care/wound managment, UE/LE Coordination activities, Pain management, Disease mangement/prevention  SLP Interventions    TR Interventions    SW/CM Interventions Discharge Planning, Psychosocial Support, Patient/Family Education   Barriers to Discharge MD  Medical stability  Nursing Decreased caregiver support, Home environment access/layout, Lack of/limited family support, Weight, Medication compliance LIves with fiance and 2 children. 2 level home with 1-2 steps to enter and no rails. 1/2 bath downstairs, 16 steps to 2nd floor with right rail, bedroom/bathroom on 2nd level. Fiance and family to be caregivers and available 24/7.  PT Inaccessible home environment, Home environment access/layout, Other (comments) 1 STE with 0 rails and 16 steps with R rail to get to main bedroom/bathroom; anxious  OT Home environment access/layout, IV antibiotics, Wound Care, Weight    SLP      SW Insurance for SNF coverage     Team Discharge Planning: Destination: PT-Home ,OT- Home , SLP-  Projected Follow-up: PT-Home health PT, OT-  Home health OT,  SLP-  Projected Equipment Needs: PT-To be determined, OT- To be determined, SLP-  Equipment  Details: PT-has none, OT-  Patient/family involved in discharge planning: PT- Patient, Family member/caregiver,  OT-Patient, SLP-   MD ELOS: 2-3 weeks Medical Rehab Prognosis:  Excellent Assessment: The patient has been admitted for CIR therapies with the diagnosis of myositis, bacteremia. The team will be addressing functional mobility, strength, stamina, balance, safety, adaptive techniques and equipment, self-care, bowel and bladder mgt, patient and caregiver education, pain mgt, ego support, community reentry. Goals have been set at supervision with mobility and self-care.   Due to the current state of emergency, patients may not be receiving their 3 hours per day of Medicare-mandated therapy.    Ranelle Oyster, MD, FAAPMR     See Team Conference Notes for weekly updates to the plan of care

## 2020-11-14 NOTE — Plan of Care (Signed)
  Problem: Consults Goal: RH GENERAL PATIENT EDUCATION Description: See Patient Education module for education specifics. Outcome: Progressing Goal: Diabetes Guidelines if Diabetic/Glucose > 140 Description: If diabetic or lab glucose is > 140 mg/dl - Initiate Diabetes/Hyperglycemia Guidelines & Document Interventions  Outcome: Progressing   Problem: RH SAFETY Goal: RH STG DECREASED RISK OF FALL WITH ASSISTANCE Description: STG Decreased Risk of Fall With cues and reminders. Outcome: Progressing   Problem: RH PAIN MANAGEMENT Goal: RH STG PAIN MANAGED AT OR BELOW PT'S PAIN GOAL Description: < 3 on a 0-10 pain scale. Outcome: Progressing   Problem: RH KNOWLEDGE DEFICIT GENERAL Goal: RH STG INCREASE KNOWLEDGE OF SELF CARE AFTER HOSPITALIZATION Description: Patient will demonstrate knowledge of medication management, pain management, safety awareness with educational materials and handouts provided by staff independently at discharge. Outcome: Progressing   

## 2020-11-15 LAB — GLUCOSE, CAPILLARY
Glucose-Capillary: 91 mg/dL (ref 70–99)
Glucose-Capillary: 236 mg/dL — ABNORMAL HIGH (ref 70–99)
Glucose-Capillary: 109 mg/dL — ABNORMAL HIGH (ref 70–99)
Glucose-Capillary: 192 mg/dL — ABNORMAL HIGH (ref 70–99)

## 2020-11-15 MED ORDER — SALINE SPRAY 0.65 % NA SOLN: 1.0000 | NASAL | Status: DC | PRN

## 2020-11-15 MED ORDER — PROPRANOLOL HCL 10 MG PO TABS
10.0000 mg | ORAL_TABLET | Freq: Every day | ORAL | Status: DC
  Administered 2020-11-15 – 2020-11-29 (×15): 10 mg via ORAL
  Filled 2020-11-15 (×15): qty 1

## 2020-11-15 NOTE — Progress Notes (Signed)
[place] PHYSICAL MEDICINE & REHABILITATION PROGRESS NOTE  Subjective/Complaints: Continues to complain of bilateral lower extremity pain- did perform better with therapy today but pain does greatly limit her sessions. Appears comfortable at rest Also with anxiety  ROS: Denies CP, SOB, N/V/D, +burning and throbbing leg pain, +dry nose  Objective: Vital Signs: Blood pressure 137/79, pulse (!) 109, temperature 99.1 F (37.3 C), temperature source Oral, resp. rate 16, height 5\' 4"  (1.626 m), weight 90.4 kg, last menstrual period 10/31/2020, SpO2 95 %, unknown if currently breastfeeding. No results found. Recent Labs    11/14/20 1251  WBC 13.1*  HGB 8.3*  HCT 26.8*  PLT 549*   Recent Labs    11/14/20 1251  NA 132*  K 4.1  CL 96*  CO2 27  GLUCOSE 132*  BUN <5*  CREATININE 0.46  CALCIUM 8.8*    Intake/Output Summary (Last 24 hours) at 11/15/2020 1106 Last data filed at 11/15/2020 0837 Gross per 24 hour  Intake 480 ml  Output 2175 ml  Net -1695 ml        Physical Exam: BP 137/79 (BP Location: Left Leg)   Pulse (!) 109   Temp 99.1 F (37.3 C) (Oral)   Resp 16   Ht 5\' 4"  (1.626 m)   Wt 90.4 kg   LMP 10/31/2020   SpO2 95%   BMI 34.21 kg/m  Constitutional: No distress . Vital signs reviewed, BMI 34.36 HENT: Normocephalic.  Atraumatic. Eyes: EOMI. No discharge. Cardiovascular: No JVD.  Tachycardia.  + Murmur. Respiratory: Normal effort.  No stridor.  Bilateral clear to auscultation. GI: Non-distended.  BS +. Skin: Warm and dry.  Intact. Psych: Flat.  Normal behavior. +anxiety Musc: Lower extremity edema.  No tenderness in extremities. Neuro: Alert Motor: Left lower extremity: 4 -/5 proximal to distal (pain inhibition) Right lower extremity: Hip flexion, knee extension 2+/5, ankle dorsiflexion 4 -/5 (pain inhibition)  Assessment/Plan: 1. Functional deficits which require 3+ hours per day of interdisciplinary therapy in a comprehensive inpatient rehab  setting. Physiatrist is providing close team supervision and 24 hour management of active medical problems listed below. Physiatrist and rehab team continue to assess barriers to discharge/monitor patient progress toward functional and medical goals   Care Tool:  Bathing    Body parts bathed by patient: Right arm, Left arm, Chest, Abdomen, Face   Body parts bathed by helper: Front perineal area, Buttocks, Right lower leg, Left lower leg     Bathing assist Assist Level: Set up assist (unable to stand to wash lower body)     Upper Body Dressing/Undressing Upper body dressing   What is the patient wearing?: Hospital gown only    Upper body assist Assist Level: Minimal Assistance - Patient > 75%    Lower Body Dressing/Undressing Lower body dressing    Lower body dressing activity did not occur: Safety/medical concerns What is the patient wearing?: Pants     Lower body assist Assist for lower body dressing:  (unable to stand with Stedy and +2)     Toileting Toileting Toileting Activity did not occur 12/31/2020 and hygiene only): N/A (no void or bm)  Toileting assist Assist for toileting: Maximal Assistance - Patient 25 - 49%     Transfers Chair/bed transfer  Transfers assist     Chair/bed transfer assist level: Minimal Assistance - Patient > 75% (Stand pivot, RW)     Locomotion Ambulation   Ambulation assist   Ambulation activity did not occur: Safety/medical concerns (fatigue, pain, decreased  balance/postural control)          Walk 10 feet activity   Assist  Walk 10 feet activity did not occur: Safety/medical concerns (fatigue, pain, decreased balance/postural control)        Walk 50 feet activity   Assist Walk 50 feet with 2 turns activity did not occur: Safety/medical concerns (fatigue, pain, decreased balance/postural control)         Walk 150 feet activity   Assist Walk 150 feet activity did not occur: Safety/medical concerns  (fatigue, pain, decreased balance/postural control)         Walk 10 feet on uneven surface  activity   Assist Walk 10 feet on uneven surfaces activity did not occur: Safety/medical concerns (fatigue, pain, decreased balance/postural control)         Wheelchair     Assist Will patient use wheelchair at discharge?: Yes Type of Wheelchair: Manual Wheelchair activity did not occur: Safety/medical concerns (fatigue, pain, decreased balance/postural control)  Wheelchair assist level: Supervision/Verbal cueing Max wheelchair distance: 37'    Wheelchair 50 feet with 2 turns activity    Assist    Wheelchair 50 feet with 2 turns activity did not occur: Safety/medical concerns (fatigue, pain, decreased balance/postural control)   Assist Level: Supervision/Verbal cueing   Wheelchair 150 feet activity     Assist  Wheelchair 150 feet activity did not occur: Safety/medical concerns (fatigue, pain, decreased balance/postural control)        Medical Problem List and Plan: 1.   Debility secondary to severe sepsis/bacteremia due to Staph aureus as well as weakness/pain from myositis  Continue CIR   -Interdisciplinary Team Conference today   2.  Antithrombotics: -DVT/anticoagulation: Venous Doppler studies negative Mechanical:  Antiembolism stockings, knee (TED hose) Bilateral lower extremities Sequential compression devices, below knee Bilateral lower extremities             -antiplatelet therapy: N/A 3. Lower extremity pain: Oxycodone Robaxin scheduled on 8/14             -kpad for muscular pain in legs  OxyContin started on 8/13  Continue gabapentin 300mg  TID 4. Anxiety: continue Atarax 25 mg twice daily as needed anxiety as well as 50 mg nightly as needed insomnia             -antipsychotic agents: N/A 5. Neuropsych: This patient is capable of making decisions on her  own behalf. 6. LUE IV infiltration:  Placed nursing order for warm compress 15 minutes TID.  Educated patient regarding benefits of this for pain control. 7. Fluids/Electrolytes/Nutrition: Routine in and outs 8.  ID/Bacteremia/MRSA endocarditis.  Follow-up infectious disease..  Continue vancomycin through 12/19/2020 9.    Acute pulmonary emboli felt to be secondary to septic pulmonary emboli .initially on heparin which was discontinued as patient developed anemia.  Lower extremity Dopplers negative.  Pulmonary was involved no recommendations for ongoing anticoagulation at this time 10.  Anemia/leukocytosis: improving, monitor weekly 11.  Hyponatremia. stable, monitor weekly 12.  Poorly controlled diabetes mellitus.  Hemoglobin A1c 12.3.   NovoLog 8 units 3 times daily, d/ced on 8/14 Semglee to 30 units daily on 8/13.  Diabetic teaching             -adjust regimen as needed  Labile on 8/16 with hypoglycemia 13.  Constipation.Miralax BID  LOS: 4 days A FACE TO FACE EVALUATION WAS PERFORMED  Chelsea Mathis Chelsea Mathis 11/15/2020, 11:06 AM

## 2020-11-15 NOTE — Progress Notes (Signed)
Occupational Therapy Session Note  Patient Details  Name: Chelsea Mathis MRN: [id number] Date of Birth: 24-Jan-1991  Today's Date: 11/15/2020 OT Individual Time: 1005-1100   &   1445-1530 OT Individual Time Calculation (min): 55 min    &   45 min   Short Term Goals: Week 1:  OT Short Term Goal 1 (Week 1): Pt will complete LB self care at sit<stand level during 2 consecutive OT sessions as evidence of improved pain mgt OT Short Term Goal 2 (Week 1): Pt will complete 1 grooming task while standing at the sink to increase standing endurance OT Short Term Goal 3 (Week 1): Pt will complete 1/3 components of donning pants using AE as needed  Skilled Therapeutic Interventions/Progress Updates:    AM session:   Patient in bed, alert and states that pain is under control at this time.  Completed lower body bathing bed level with max A, donns underwear/pants max A, slipper socks dependent, supine to sitting edge of bed with mod A and increased time.  Sit to stand and SPT with RW bed to commode to w/c with min A and increased time.   She is continent of urine on bedside commode, dependent for hygiene and clothing management in stance.  Set up at sink for UB bathing, dressing and grooming tasks.  Completed w/c level upper body and core conditioning activities.   She remained seated in w/c at close of session - reviewed schedule and planned rest breaks based on difficulty with change of position yesterday.  Call bell and tray table in reach.     PM session:   patient in bed, alert but states that she did not get up as planned earlier session due to pain but she is willing to try change of position and attempt to move as able.  Supine to sitting edge of bed with mod A - used gait belt to guide right leg off bed.  Completed unsupported sitting trunk and leg exercises with improved mobility and slight improvement with pain.  Sit to stand from elevated bed surface x3 with CGA.  She maintained standing for less than  30-45 seconds each attempt.  She requests to use commode to void - able to complete SPT to/from bed and commode with min A and increased time.  Continent of urine, requires max A/dep for pants down/up and hygiene.  Sit to supine with max A due to increased pain.  She remained in bed at close of session, bed alarm set and call bell in reach.  Significant other present at close of session.       Therapy Documentation Precautions:  Precautions Precautions: Fall Precaution Comments: high levels of pain Restrictions Weight Bearing Restrictions: No   Therapy/Group: Individual Therapy  Barrie Lyme 11/15/2020, 7:30 AM

## 2020-11-15 NOTE — Progress Notes (Signed)
Occupational Therapy Session Note  Patient Details  Name: Chelsea Mathis MRN: [id number] Date of Birth: 1990-08-21  Today's Date: 11/15/2020 OT Individual Time: 1130-1155 OT Individual Time Calculation (min): 25 min    Short Term Goals: Week 1:  OT Short Term Goal 1 (Week 1): Pt will complete LB self care at sit<stand level during 2 consecutive OT sessions as evidence of improved pain mgt OT Short Term Goal 2 (Week 1): Pt will complete 1 grooming task while standing at the sink to increase standing endurance OT Short Term Goal 3 (Week 1): Pt will complete 1/3 components of donning pants using AE as needed  Skilled Therapeutic Interventions/Progress Updates:    Pt resting in w/c upon arrival with fiancee present. Pt requested to return to bed (pain per below). Max A for squat pivot transfer to EOB. Pain increased and pt required tot A to complete transfer. Pt required time sitting EOB to allow pain to subside before repositioning in bed. Pt's fiancee assisted with repositioning in bed. Pt remained in bed with all needs within reach. Bed alarm activated. Fiancee present.   Therapy Documentation Precautions:  Precautions Precautions: Fall Precaution Comments: high levels of pain Restrictions Weight Bearing Restrictions: No   Pain: Pt c/o 10/11 Rt hip/RLE pain while seated in w/c and increased with transfer; repositioned, heat applied, and RN notifed of request for pain meds     Therapy/Group: Individual Therapy  Rich Brave 11/15/2020, 12:04 PM

## 2020-11-15 NOTE — Progress Notes (Signed)
Physical Therapy Session Note  Patient Details  Name: Chelsea Mathis MRN: [id number] Date of Birth: 01/11/1991  Today's Date: 11/15/2020 PT Individual Time: 8250-5397 PT Individual Time Calculation (min): 54 min   Short Term Goals: Week 1:  PT Short Term Goal 1 (Week 1): pt will perform bed mobility with min A consistantly PT Short Term Goal 2 (Week 1): pt will transfer sit<>stand with LRAD and min A PT Short Term Goal 3 (Week 1): pt will ambulate 63ft with LRAD and mod A  Skilled Therapeutic Interventions/Progress Updates:  Pt received semi-reclined in bed, heat pad on R hip and reported 8/10 pain in RLE. Pt not due for pain meds, offered positional changes/stretching for pain management. Pt performed Supine <>sit EOB very slowly w/min A for BLE management. Pt unable to tolerate sitting EOB 2/2 pain, did not try sit <>stand 2/2 pain, performed sit <>supine w/min A for BLE management and pain went up to 9/10. Educated pt on light stretching for pain relief, as she stated she feels tightness in her R hip down to her knee. Pt hesitant to lie flat in supine 2/2 feeling as though she is suffocating, but after encouragement and education on diaphragmatic breathing, she was agreeable. Pt laid flat in supine to facilitate light hip flexor stretch for 10 minutes and pain decreased to 8/10, but suddenly had increase in pain requiring HOB to be elevated and heat pack reapplied to R hip. Pt performed 2x10 heel slides on LLE with HOB elevated in supine w/AAROM from RUE. Required rest break between sets, pain maintained at 8/10 on R side, no complaint of pain/tightness on L. Encouraged pt to perform throughout day to reduce hip tightness between therapy sessions, pt verbalized understanding. Pain increased to a 9/10 at end of session, propped RLE up on pillows per pt request. Pt was left supine in bed w/RLE propped and all needs in reach. Nursing notified of situation.   Therapy Documentation Precautions:   Precautions Precautions: Fall Precaution Comments: high levels of pain Restrictions Weight Bearing Restrictions: No   Therapy/Group: Individual Therapy Jill Alexanders Divon Krabill, PT, DPT  11/15/2020, 1:58 PM

## 2020-11-15 NOTE — Plan of Care (Signed)
  Problem: Consults Goal: RH GENERAL PATIENT EDUCATION Description: See Patient Education module for education specifics. Outcome: Progressing Goal: Diabetes Guidelines if Diabetic/Glucose > 140 Description: If diabetic or lab glucose is > 140 mg/dl - Initiate Diabetes/Hyperglycemia Guidelines & Document Interventions  Outcome: Progressing   Problem: RH SAFETY Goal: RH STG DECREASED RISK OF FALL WITH ASSISTANCE Description: STG Decreased Risk of Fall With cues and reminders. Outcome: Progressing   Problem: RH PAIN MANAGEMENT Goal: RH STG PAIN MANAGED AT OR BELOW PT'S PAIN GOAL Description: < 3 on a 0-10 pain scale. Outcome: Progressing   Problem: RH KNOWLEDGE DEFICIT GENERAL Goal: RH STG INCREASE KNOWLEDGE OF SELF CARE AFTER HOSPITALIZATION Description: Patient will demonstrate knowledge of medication management, pain management, safety awareness with educational materials and handouts provided by staff independently at discharge. Outcome: Progressing   

## 2020-11-15 NOTE — Progress Notes (Signed)
Patient ID: Chelsea Mathis, female   DOB: [birth year], 30 y.o.   MRN: [id number]  Met with pt and fiance to update regarding team conference goals of supervision-mod/I level and target discharge date 8/30. Both pleased with pt having less pain today and hopes this will continue. Will try to find home health agency who can provide therapies along with RN for her IV antibiotics

## 2020-11-15 NOTE — Patient Care Conference (Signed)
Inpatient RehabilitationTeam Conference and Plan of Care Update Date: 11/15/2020   Time: 12:10 PM    Patient Name: Chelsea Mathis      Medical Record Number: [id number]  Date of Birth: 03-28-1991 Sex: Female         Room/Bed: 4W22C/4W22C-01 Payor Info: Payor: [place] MEDICAID PREPAID HEALTH PLAN / Plan: [place] MEDICAID [facility] / Product Type: *No Product type* /    Admit Date/Time:  11/11/2020  3:17 PM  Primary Diagnosis:  Myositis  Hospital Problems: Principal Problem:   Myositis Active Problems:   Acute blood loss anemia   Leukocytosis   Hyponatremia   Acute bacterial endocarditis   Pain of right thigh   Labile blood glucose    Expected Discharge Date: Expected Discharge Date: 11/29/20  Team Members Present: Physician leading conference: Dr. Sula Soda Social Worker Present: Dossie Der, LCSW Nurse Present: Chana Bode, RN PT Present: Ernestene Kiel, PT OT Present: Towanda Malkin, OT     Current Status/Progress Goal Weekly Team Focus  Bowel/Bladder   Pt continent of B/B LBM 11/12/2020  Regular BMs every 3 days or less  Assess B/B every shift and PRN   Swallow/Nutrition/ Hydration             ADL's   UB ADLs set up A; mod-total A LB ADLs; mod A toileting; mod A slide board transfer; max +2 sit<>stand; limited by pain, anxiety, generalized weakness  spvsn for sit<>stand level ADLs/functional mobility; mod I UB ADLs  pain management, transfer and ADL training, DME/AE, energy conservation, endurance/conditioning   Mobility   Max-min A bed mobility, min-max A for transfers, sup WC  Supervision  Pain management, transfers, initiate gait training   Communication             Safety/Cognition/ Behavioral Observations            Pain   Pt reporting a lot of pain especially with movement. Does get relief from PRN oxycodone, robaxin, and gabapentin. Pain 8-9/10 Pain to legs bilaterally  Pain level <3/10 especially with movement  Assess pain every shift and PRN   Skin   Skin  intact  no new breakdown  Assess skin every shift and PRN     Discharge Planning:  Home with fiance-[name] and other family members to assist with her care. Very limited by pain and feels if better could do more in therapies   Team Discussion: MD addressing pain; increased gabepentin, monitoring for drowsiness and monitoring tachycardia. Pain in arm as well where IV infiltrated, and nose dry from oxygen use. IV abx through 12/19/20.  Patient on target to meet rehab goals: yes, currently min assist for stand pivot transfers. Requires max assist for lower body bathing and dependent for clothes management/hygiene. Able to ambulate with min assist but activity tolerance limited by stiffness, and pain. Goals for discharge set at supervision level  *See Care Plan and progress notes for long and short-term goals.   Revisions to Treatment Plan:    Teaching Needs: Medications, safety, etc  Current Barriers to Discharge: Decreased caregiver support, Home enviroment access/layout, and IV antibiotics  Possible Resolutions to Barriers: Family education     Medical Summary Current Status: bilateral lower extremity leg pain, LUE pain/warmth/tightness following IV infiltration, tachycardia up to 109, nose dry, obesity (BMI 34.21)  Barriers to Discharge: Medical stability  Barriers to Discharge Comments: bilateral lower extremity leg pain, LUE pain/warmth/tightness following IV infiltration, tachycardia up to 109, nose dry, obesity (BMI 34.21) Possible Resolutions to [employer] Focus:  increase gabapentin dose, apply warm compress to her left upper extremity, start nasal spray, start propanolol, provide dietary education   Continued Need for Acute Rehabilitation Level of Care: The patient requires daily medical management by a physician with specialized training in physical medicine and rehabilitation for the following reasons: Direction of a multidisciplinary physical rehabilitation program to  maximize functional independence : Yes Medical management of patient stability for increased activity during participation in an intensive rehabilitation regime.: Yes Analysis of laboratory values and/or radiology reports with any subsequent need for medication adjustment and/or medical intervention. : Yes   I attest that I was present, lead the team conference, and concur with the assessment and plan of the team.   Chana Bode B 11/15/2020, 3:26 PM

## 2020-11-16 LAB — GLUCOSE, CAPILLARY
Glucose-Capillary: 104 mg/dL — ABNORMAL HIGH (ref 70–99)
Glucose-Capillary: 115 mg/dL — ABNORMAL HIGH (ref 70–99)
Glucose-Capillary: 152 mg/dL — ABNORMAL HIGH (ref 70–99)
Glucose-Capillary: 102 mg/dL — ABNORMAL HIGH (ref 70–99)

## 2020-11-16 LAB — VANCOMYCIN, PEAK: Vancomycin Pk: 26 ug/mL — ABNORMAL LOW (ref 30–40)

## 2020-11-16 LAB — VANCOMYCIN, TROUGH: Vancomycin Tr: 10 ug/mL — ABNORMAL LOW (ref 15–20)

## 2020-11-16 NOTE — Progress Notes (Signed)
Pt called saying her nose was bleeding. On assessment pt had very minimal bleeding from right nare. Pt applied a tissue to nare to absorb fluid. Pt stated that she has had prior nose bleed episodes and it is due to her having "a dry nose". Will let oncoming shift know that she would like the doctor to prescribe her a nasal spray. Nose bleed stopped with very minimal fluid. Pt left in bed with call bell within reach and no other needs at this time.

## 2020-11-16 NOTE — Progress Notes (Signed)
[facility] PHYSICAL MEDICINE & REHABILITATION PROGRESS NOTE  Subjective/Complaints: Patient seen sitting up in bed this morning.  She states she slept well overnight.  She notes improvement in strength.  ROS: Denies CP, SOB, N/V/D  Objective: Vital Signs: Blood pressure 129/85, pulse (!) 101, temperature (!) 97.4 F (36.3 C), resp. rate 18, height 5\' 4"  (1.626 m), weight 90.6 kg, last menstrual period 10/31/2020, SpO2 98 %, unknown if currently breastfeeding. No results found. Recent Labs    11/14/20 1251  WBC 13.1*  HGB 8.3*  HCT 26.8*  PLT 549*    Recent Labs    11/14/20 1251  NA 132*  K 4.1  CL 96*  CO2 27  GLUCOSE 132*  BUN <5*  CREATININE 0.46  CALCIUM 8.8*     Intake/Output Summary (Last 24 hours) at 11/16/2020 1113 Last data filed at 11/16/2020 0830 Gross per 24 hour  Intake 880 ml  Output 1350 ml  Net -470 ml         Physical Exam: BP 129/85 (BP Location: Left Leg)   Pulse (!) 101   Temp (!) 97.4 F (36.3 C)   Resp 18   Ht 5\' 4"  (1.626 m)   Wt 90.6 kg   LMP 10/31/2020   SpO2 98%   BMI 34.28 kg/m  Constitutional: No distress . Vital signs reviewed. HENT: Normocephalic.  Atraumatic. Eyes: EOMI. No discharge. Cardiovascular: No JVD.  + Tachycardia.  + Murmur. Respiratory: Normal effort.  No stridor.  Bilateral clear to auscultation. GI: Non-distended.  BS +. Skin: Warm and dry.  Intact. Psych: Flat.  Normal behavior. Musc: Right lower extremity edema.  No tenderness in extremities. Neuro: Alert Motor: Left lower extremity: 4 -/5 proximal to distal (pain inhibition), stable Right lower extremity: Hip flexion, knee extension 2+/5, ankle dorsiflexion 4 -/5 (pain inhibition), stable  Assessment/Plan: 1. Functional deficits which require 3+ hours per day of interdisciplinary therapy in a comprehensive inpatient rehab setting. Physiatrist is providing close team supervision and 24 hour management of active medical problems listed  below. Physiatrist and rehab team continue to assess barriers to discharge/monitor patient progress toward functional and medical goals   Care Tool:  Bathing    Body parts bathed by patient: Right arm, Left arm, Chest, Abdomen, Face, Right upper leg, Left upper leg   Body parts bathed by helper: Front perineal area, Buttocks, Right lower leg, Left lower leg     Bathing assist Assist Level: Moderate Assistance - Patient 50 - 74%     Upper Body Dressing/Undressing Upper body dressing   What is the patient wearing?: Pull over shirt    Upper body assist Assist Level: Set up assist    Lower Body Dressing/Undressing Lower body dressing    Lower body dressing activity did not occur: Safety/medical concerns What is the patient wearing?: Pants, Underwear/pull up     Lower body assist Assist for lower body dressing: Maximal Assistance - Patient 25 - 49%     Toileting Toileting Toileting Activity did not occur (Clothing management and hygiene only): N/A (no void or bm)  Toileting assist Assist for toileting: Total Assistance - Patient < 25%     Transfers Chair/bed transfer  Transfers assist     Chair/bed transfer assist level: Minimal Assistance - Patient > 75% (Stand pivot, RW)     Locomotion Ambulation   Ambulation assist   Ambulation activity did not occur: Safety/medical concerns (fatigue, pain, decreased balance/postural control)          Walk  10 feet activity   Assist  Walk 10 feet activity did not occur: Safety/medical concerns (fatigue, pain, decreased balance/postural control)        Walk 50 feet activity   Assist Walk 50 feet with 2 turns activity did not occur: Safety/medical concerns (fatigue, pain, decreased balance/postural control)         Walk 150 feet activity   Assist Walk 150 feet activity did not occur: Safety/medical concerns (fatigue, pain, decreased balance/postural control)         Walk 10 feet on uneven surface   activity   Assist Walk 10 feet on uneven surfaces activity did not occur: Safety/medical concerns (fatigue, pain, decreased balance/postural control)         Wheelchair     Assist Will patient use wheelchair at discharge?: Yes Type of Wheelchair: Manual Wheelchair activity did not occur: Safety/medical concerns (fatigue, pain, decreased balance/postural control)  Wheelchair assist level: Supervision/Verbal cueing Max wheelchair distance: 44'    Wheelchair 50 feet with 2 turns activity    Assist    Wheelchair 50 feet with 2 turns activity did not occur: Safety/medical concerns (fatigue, pain, decreased balance/postural control)   Assist Level: Supervision/Verbal cueing   Wheelchair 150 feet activity     Assist  Wheelchair 150 feet activity did not occur: Safety/medical concerns (fatigue, pain, decreased balance/postural control)        Medical Problem List and Plan: 1.   Debility secondary to severe sepsis/bacteremia due to Staph aureus as well as weakness/pain from myositis  Continue CIR   -Interdisciplinary Team Conference today   2.  Antithrombotics: -DVT/anticoagulation: Venous Doppler studies negative Mechanical:  Antiembolism stockings, knee (TED hose) Bilateral lower extremities Sequential compression devices, below knee Bilateral lower extremities             -antiplatelet therapy: N/A 3. Lower extremity pain: Oxycodone Robaxin scheduled on 8/14             -kpad for muscular pain in legs  OxyContin started on 8/13  Continue gabapentin 300mg  TID  Relatively controlled on 8/17 4. Anxiety: continue Atarax 25 mg twice daily as needed anxiety as well as 50 mg nightly as needed insomnia             -antipsychotic agents: N/A 5. Neuropsych: This patient is capable of making decisions on her  own behalf. 6. LUE IV infiltration:  Placed nursing order for warm compress 15 minutes TID. Educated patient regarding benefits of this for pain control. 7.  Fluids/Electrolytes/Nutrition: Routine in and outs 8.  ID/Bacteremia/MRSA endocarditis.  Follow-up infectious disease..  Continue vancomycin through 12/19/2020 9.    Acute pulmonary emboli felt to be secondary to septic pulmonary emboli .initially on heparin which was discontinued as patient developed anemia.  Lower extremity Dopplers negative.  Pulmonary was involved no recommendations for ongoing anticoagulation at this time 10.  Anemia/leukocytosis: improving, monitor weekly 11.  Hyponatremia.   Sodium 132 on 8/15  Continue to monitor 12.  Poorly controlled diabetes mellitus.  Hemoglobin A1c 12.3.   NovoLog 8 units 3 times daily, d/ced on 8/14 Semglee to 30 units daily on 8/13.  Diabetic teaching             -adjust regimen as needed  Labile on 8/17, monitor for trend 13.  Constipation.Miralax BID  LOS: 5 days A FACE TO FACE EVALUATION WAS PERFORMED  Brannon Decaire 9/17 11/16/2020, 11:13 AM

## 2020-11-16 NOTE — Progress Notes (Addendum)
Physical Therapy Session Note  Patient Details  Name: Chelsea Mathis MRN: [id number] Date of Birth: 04/22/1990  Today's Date: 11/16/2020 PT Individual Time:Session1: 0017-4944; [place]: 9675-9163 PT Individual Time Calculation (min): 64 min & 64 min  Short Term Goals: Week 1:  PT Short Term Goal 1 (Week 1): pt will perform bed mobility with min A consistantly PT Short Term Goal 2 (Week 1): pt will transfer sit<>stand with LRAD and min A PT Short Term Goal 3 (Week 1): pt will ambulate 32ft with LRAD and mod A  Skilled Therapeutic Interventions/Progress Updates:    Session1: Patient in supine and reporting some increased stiffness in hips after episode of sitting too long yesterday and difficulty with transfer back to bed.  Patient in supine for ankle pumps. AAROM heels slides.  Supine to sit with mod A for R LE and to lift trunk. Demonstrated ace wrap to R hip if desired for supporting muscle and pt agreeable.  Sit to stand from elevated surface with min A to RW.  Standing with UE support while applying 2 6" ace wraps to R groin.  Patient rested then performed standing 2 steps forward and 2 steps back x 4 reps.  C/o R ankle pain so wrapped ankle with 4" ace wrap.  Patient sit to stand min A from higher seat to RW and ambulated 3 steps forward and 3 back with min A and increased time.  Seated for passive hip stretch into ER and abduction 3 x 20 seconds as tolerated.  Performed marching x 10, hip adductor squeezes with pillow x 5 sec hold x 10 and hip abduction with orange t-band 2 x 10.  Patient sit to stand to RW min A from higher surface and ambulated x 20' to door and back with RW min A and increased time.  Patient requesting to toilet so brought [facility] and patient performed stand step to [facility] max A for clothing management and toilet hygiene.  Patient returned to supine mod A for both LE's and positioned for comfort with ice replaced to R hip, all needs in reach and bed alarm active.  Session2: Patient  supine and fiance in the room.  Patient tearful about still stiff and sore all over.  Encouraged and performed "warm up" exercises ankle pumps and AAROM heel slides and SAQ 5-10 reps each.  Wrapped L ankle with 4" ace wrap in supine.  Supine to sit with min to mod A for R LE and scooting hips with HOB elevated.  Performed sit to stand and applied 2 6" ace wraps to R hip, then step with RW to w/c min A.  Patient assisted in w/c to dayroom.  Stand step to mat with RW and min A.  Patient seated for core activities using small yellow weighted ball moving in and out, up and down at arms length, moving hip to hip and shoulder to opposite hip both ways all x 10 with cues for technique and prevent arching back.  Seated for horizontal shoulder abduction with orange t-band x 10, seated rows with band, then diagonal shoulder elevation and external rotation all x 10 each side.  Patient seated for hip adductor squeezes w/ 5 sec hold x 10 and hip abduction with orange t-band 2 x 10.  Sit to stand to RW min A ambulated x 12' with RW and CGA with increased time, decreased foot clearance and short stride length.  Patient assisted in w/c to room.  Performed stand step in bathroom from w/c to  toilet using grabbars stand to sit mod A, sit to stand mod A and assist for hygiene and clothing management.  Patient in w/c assisted to stand step to bed with RW and min A.  Sit to supine mod A for LE's. Left positioned for comfort with ice to R hip, call bell and needs in reach, fiance in the room and bed alarm active.   Therapy Documentation Precautions:  Precautions Precautions: Fall Precaution Comments: high levels of pain Restrictions Weight Bearing Restrictions: No  Pain: Pain Assessment Pain Score: 3    Therapy/Group: Individual Therapy  Elray Mcgregor Sheran Lawless, PT 11/16/2020, 12:55 PM

## 2020-11-16 NOTE — Progress Notes (Signed)
Occupational Therapy Session Note  Patient Details  Name: Chelsea Mathis MRN: [id number] Date of Birth: October 21, 1990  Today's Date: 11/16/2020 OT Individual Time: 0900-1013 OT Individual Time Calculation (min): 73 min    Short Term Goals: Week 1:  OT Short Term Goal 1 (Week 1): Pt will complete LB self care at sit<stand level during 2 consecutive OT sessions as evidence of improved pain mgt OT Short Term Goal 2 (Week 1): Pt will complete 1 grooming task while standing at the sink to increase standing endurance OT Short Term Goal 3 (Week 1): Pt will complete 1/3 components of donning pants using AE as needed  Skilled Therapeutic Interventions/Progress Updates:  Pt greeted supine in bed reporting pain but agreeable to attempt session. Pt required MIN A for supine>sitting with increased time and effort and step by step cues. Pt able to sit EOB with supervision for UB grooming tasks, set- up for UB grooming tasks. Pt continues to present with increased pain, education provided on pain mgmt strategies such as use of imagery, aromatherapy, music and distraction. Waited for RN to enter with pain meds. Pt required MAX A to don pants from EOB via lateral leans. Pt declined lateral scoot transfer to w/c or stand pivot transfer from EOB>w/c secondary to pain. Pt required MOD A to return to supine, pt left supine in bed with all needs within reach and bed alarm activated.  Therapy Documentation Precautions:  Precautions Precautions: Fall Precaution Comments: high levels of pain Restrictions Weight Bearing Restrictions: No   Pain: Pt reports pain >10 in bilateral hips and ankles, alered RN for pain meds. Utilized relaxation techniques, repositioning and stretching as pain mgmt strategy.      Therapy/Group: Individual Therapy  Pollyann Glen [facility] 11/16/2020, 12:00 PM

## 2020-11-16 NOTE — Progress Notes (Signed)
Pharmacy Antibiotic Note  Chelsea Mathis is a 30 y.o. 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  Pharmacy was consulted for vancomycin dosing. Last vancomycin peak/trough drawn 8/11, vancomycin AUC = 494 (within goal range of 400-550).   Steady-state vancomycin levels drawn after last dose of vancomycin 1750 mg IV Q 12 hr regimen (dose administered at 0815 AM today) are as follows: Vancomycin peak level (drawn at 1236 PM): 26 mg/L Vancomycin trough level (drawn at 1924 PM): 10 mg/L Vancomycin AUC calculated from the levels above is 481, which remains within the goal AUC range of 400-550.  WBC 13.1, Tmax 99.6 F; Scr (8/15) 0.46, up slightly; CrCl 110 ml/min  Plan: Continue Vancomycin 1750 mg IV Q 12 hrs (end date 12/19/20 ) Monitor weekly vancomycin levels (ck Scr on same day as vancomycin levels) Monitor WBC, temp, clinical improvement, renal function  Height: 5\' 4"  (162.6 cm) Weight: 90.6 kg (199 lb 11.8 oz) IBW/kg (Calculated) : 54.7  Temp (24hrs), Avg:98.9 F (37.2 C), Min:97.4 F (36.3 C), Max:99.6 F (37.6 C)  Recent Labs  Lab 11/10/20 0514 11/10/20 1424 11/10/20 2153 11/14/20 1251 11/16/20 1236 11/16/20 1924  WBC 19.4*  --   --  13.1*  --   --   CREATININE 0.39*  --   --  0.46  --   --   VANCOTROUGH  --   --  11*  --   --  10*  VANCOPEAK  --  32  --   --  26*  --      Estimated Creatinine Clearance: 110.1 mL/min (by C-G formula based on SCr of 0.46 mg/dL).    No Known Allergies  Antimicrobials this admission: Vancomycin  8/1 >> (12/19/20) Zosyn 8/1 >> 8/2 Clindamycin  x 1 8/1 Cefepime 8/6 >> 8/9 Mupirocin nasal 8/3 >> 8/9  Microbiology results: 8/1 BCx: 2/2 MRSA 8/1 urine: multiple species. Recollection suggested 8/1 COVID, flu A/B, HIV screen: negative 8/1 MRSA PCR: positive 8/3 HCV Aby negative 8/2 BCx x 2: NG/final 8/5 BCx X 2: 1/4 btls with MRSA 8/6 BCx  X 2: 1/4 btls with MRSA 8/8 Bcx X 2: NG/final  Thank you for allowing pharmacy to  be a part of this patient's care.  10/1, PharmD, BCPS, [facility] Clinical Pharmacist 11/16/2020 8:09 PM

## 2020-11-17 LAB — GLUCOSE, CAPILLARY
Glucose-Capillary: 81 mg/dL (ref 70–99)
Glucose-Capillary: 123 mg/dL — ABNORMAL HIGH (ref 70–99)
Glucose-Capillary: 68 mg/dL — ABNORMAL LOW (ref 70–99)
Glucose-Capillary: 126 mg/dL — ABNORMAL HIGH (ref 70–99)

## 2020-11-17 MED ORDER — SALINE SPRAY 0.65 % NA SOLN
1.0000 | Freq: Two times a day (BID) | NASAL | Status: DC
  Administered 2020-11-17 – 2020-11-29 (×22): 1 via NASAL
  Filled 2020-11-17: qty 44

## 2020-11-17 NOTE — Progress Notes (Signed)
Physical Therapy Session Note  Patient Details  Name: Chelsea Mathis MRN: [id number] Date of Birth: [birth year]  Today's Date: 11/17/2020 PT Individual Time: 1104-1200; 0174-9449 PT Individual Time Calculation (min): 56 min and 33 min  Short Term Goals: Week 1:  PT Short Term Goal 1 (Week 1): pt will perform bed mobility with min A consistantly PT Short Term Goal 2 (Week 1): pt will transfer sit<>stand with LRAD and min A PT Short Term Goal 3 (Week 1): pt will ambulate 60ft with LRAD and mod A  Skilled Therapeutic Interventions/Progress Updates:  Session 1  Pt received supine in bed w/fiance by her side, tearful and reporting 8/10 pain in B hips and RLE. Pt reported she had received pain meds about 10 minutes prior to session and verbalized frustration with not receiving meds on a regular schedule. Communicated concerns with nursing and care team. Pt performed 5 AAROM heel slides of LLE in bed very slowly 2/2 pain. Encouraged pt to try to sit EOB using gait belt to assist w/RLE. Pt hesistant but agreeable, moved very slowly and took frequent breaks to rub her legs and yell out in pain, but was able to perform w/CGA. At EOB, pt performed sit <>stand w/RW and ambulated 5' slowly to [facility]. Provided tactile cues to promote trunk extension in standing and lateral weight shifting. Noted pt slides R foot forward rather than stepping. Stand <>sit in [facility] w/min A 2/2 pt uncontrolled sitting and guiding hips to chair. Practiced 4 sit <>stands w/education on sequencing, hand placement and lowering herself to chair slowly. Pt able to perform w/CGA. On 4th stand, pt ambulated back to bed 5' slowly w/RW and CGA. Sit EOB <>supine w/min A for LE management, provided education on techniques to use gait belt to assist w/LEs and to move trunk and legs together, pt reported she was unable to do so 2/2 pain. Heavy education regarding stretching and dynamic movement for pain management, pt reported she is "constantly being told  to push through the pain and suck it up". Provided emotional support and encouragement. Pt was left supine in bed, RLE propped w/heat pack and all needs in reach. She reported pain as 9/10 in RLE, RN notified.   Session 2 Pt received supine in bed w/heating pad on R hip, fiance present in room. Reported 8/10 pain in B hips and RLE, had received pain meds ~15 minutes prior to session. Discussed with pt about switching to 15/7 schedule, provided rationale and pros/cons. Pt very hesitant and requested more time to think about it, care team made aware. Emphasis of session on improved transfers and tolerance to therapy. Educated pt on hook technique for better management of RLE during transfers. Pt able to demonstrate and perform supine <>sit EOB w/CGA. Attempted to practice sit EOB <>supine using method, pt still required min A for RLE management 2/2 pain. Performed supine <>sit EOB and sit <>stand w/CGA to RW and pt held stand for 90s with erect posture. Stand <>sit w/CGA as pt demonstrated slow lowering to bed. Pt unable to perform sit <>supine without yelling out in pain, requiring mod A from therapist and fiance to reposition pt in bed. Pt hysterical over pain, provided heavy verbal cues for deep breathing and provided ice pack for hip. Pt was left supine in bed crying w/RLE propped up, ice pack on hip and all needs within reach, fiance present. Pt reported pain as >10/10, RN notified of situation.   Therapy Documentation Precautions:  Precautions Precautions: Fall Precaution Comments:  high levels of pain Restrictions Weight Bearing Restrictions: No   Therapy/Group: Individual Therapy Jill Alexanders [place], PT, DPT  11/17/2020, 7:46 AM

## 2020-11-17 NOTE — Progress Notes (Signed)
Pt continues to have bad pain, yellow mews protocol implemented. Pt also complained of midline IV site hurting while vanco was infusing, infusion stopped. Upon reassessment site was leaking. IV team came and saw pt. Removed midline, inserted Peripheral IV, Vanco restarted. IV team recommends PICC line be inserted. Left message for doctor to see in morning.

## 2020-11-17 NOTE — Progress Notes (Signed)
[place] PHYSICAL MEDICINE & REHABILITATION PROGRESS NOTE  Subjective/Complaints: She complains of nosebleeing overnight. Did not receive nasal spray- I have scheduled it BID now She also has swelling on dorsum of right wrist from where blood was drawn  ROS: Denies CP, SOB, N/V/D, +nosebleed  Objective: Vital Signs: Blood pressure 126/72, pulse (!) 102, temperature 99 F (37.2 C), resp. rate 18, height 5\' 4"  (1.626 m), weight 90.6 kg, last menstrual period 10/31/2020, SpO2 96 %, unknown if currently breastfeeding. No results found. Recent Labs    11/14/20 1251  WBC 13.1*  HGB 8.3*  HCT 26.8*  PLT 549*   Recent Labs    11/14/20 1251  NA 132*  K 4.1  CL 96*  CO2 27  GLUCOSE 132*  BUN <5*  CREATININE 0.46  CALCIUM 8.8*    Intake/Output Summary (Last 24 hours) at 11/17/2020 1058 Last data filed at 11/17/2020 0700 Gross per 24 hour  Intake 360 ml  Output 500 ml  Net -140 ml        Physical Exam: BP 126/72 (BP Location: Left Leg)   Pulse (!) 102   Temp 99 F (37.2 C)   Resp 18   Ht 5\' 4"  (1.626 m)   Wt 90.6 kg   LMP 10/31/2020   SpO2 96%   BMI 34.28 kg/m  Constitutional: No distress . Vital signs reviewed. HENT: Normocephalic.  Atraumatic. Eyes: EOMI. No discharge. Cardiovascular: No JVD.  + Tachycardia.  + Murmur. Respiratory: Normal effort.  No stridor.  Bilateral clear to auscultation. GI: Non-distended.  BS +. Skin: Swelling over LUE and dorsum of right wrist Psych: Flat.  Normal behavior. Musc: Right lower extremity edema.  No tenderness in extremities. Neuro: Alert Motor: Left lower extremity: 4 -/5 proximal to distal (pain inhibition), stable Right lower extremity: Hip flexion, knee extension 2+/5, ankle dorsiflexion 4 -/5 (pain inhibition), stable  Assessment/Plan: 1. Functional deficits which require 3+ hours per day of interdisciplinary therapy in a comprehensive inpatient rehab setting. Physiatrist is providing close team supervision and 24  hour management of active medical problems listed below. Physiatrist and rehab team continue to assess barriers to discharge/monitor patient progress toward functional and medical goals   Care Tool:  Bathing    Body parts bathed by patient: Face   Body parts bathed by helper: Front perineal area, Buttocks, Right lower leg, Left lower leg     Bathing assist Assist Level: Set up assist     Upper Body Dressing/Undressing Upper body dressing   What is the patient wearing?: Pull over shirt    Upper body assist Assist Level: Set up assist    Lower Body Dressing/Undressing Lower body dressing    Lower body dressing activity did not occur: Safety/medical concerns What is the patient wearing?: Pants     Lower body assist Assist for lower body dressing: Maximal Assistance - Patient 25 - 49%     Toileting Toileting Toileting Activity did not occur (Clothing management and hygiene only): N/A (no void or bm)  Toileting assist Assist for toileting: Total Assistance - Patient < 25%     Transfers Chair/bed transfer  Transfers assist     Chair/bed transfer assist level: Minimal Assistance - Patient > 75% (Stand pivot, RW)     Locomotion Ambulation   Ambulation assist   Ambulation activity did not occur: Safety/medical concerns (fatigue, pain, decreased balance/postural control)          Walk 10 feet activity   Assist  Walk 10 feet  activity did not occur: Safety/medical concerns (fatigue, pain, decreased balance/postural control)        Walk 50 feet activity   Assist Walk 50 feet with 2 turns activity did not occur: Safety/medical concerns (fatigue, pain, decreased balance/postural control)         Walk 150 feet activity   Assist Walk 150 feet activity did not occur: Safety/medical concerns (fatigue, pain, decreased balance/postural control)         Walk 10 feet on uneven surface  activity   Assist Walk 10 feet on uneven surfaces activity did  not occur: Safety/medical concerns (fatigue, pain, decreased balance/postural control)         Wheelchair     Assist Will patient use wheelchair at discharge?: Yes Type of Wheelchair: Manual Wheelchair activity did not occur: Safety/medical concerns (fatigue, pain, decreased balance/postural control)  Wheelchair assist level: Supervision/Verbal cueing Max wheelchair distance: 28'    Wheelchair 50 feet with 2 turns activity    Assist    Wheelchair 50 feet with 2 turns activity did not occur: Safety/medical concerns (fatigue, pain, decreased balance/postural control)   Assist Level: Supervision/Verbal cueing   Wheelchair 150 feet activity     Assist  Wheelchair 150 feet activity did not occur: Safety/medical concerns (fatigue, pain, decreased balance/postural control)        Medical Problem List and Plan: 1.   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  Antithrombotics: -DVT/anticoagulation: Venous Doppler studies negative Mechanical:  Antiembolism stockings, knee (TED hose) Bilateral lower extremities Sequential compression devices, below knee Bilateral lower extremities             -antiplatelet therapy: N/A 3. Lower extremity pain:  Robaxin scheduled on 8/14             -kpad for muscular pain in legs  Continue OxyContin started on 8/13  Continue gabapentin 300mg  TID  Relatively controlled on 8/18 4. Anxiety: continue Atarax 25 mg twice daily as needed anxiety as well as 50 mg nightly as needed insomnia             -antipsychotic agents: N/A 5. Neuropsych: This patient is capable of making decisions on her  own behalf. 6. LUE forearm and RUE dorsum of hand IV infiltrations:  Placed nursing order for warm compress 15 minutes TID. Educated patient regarding benefits of this for pain control. 7. Fluids/Electrolytes/Nutrition: Routine in and outs 8.  ID/Bacteremia/MRSA  endocarditis.  Follow-up infectious disease..  Continue vancomycin through 12/19/2020 9.    Acute pulmonary emboli felt to be secondary to septic pulmonary emboli .initially on heparin which was discontinued as patient developed anemia.  Lower extremity Dopplers negative.  Pulmonary was involved no recommendations for ongoing anticoagulation at this time 10.  Anemia/leukocytosis: improving, monitor weekly 11.  Hyponatremia.   Sodium 132 on 8/15  Continue to monitor 12.  Poorly controlled diabetes mellitus.  Hemoglobin A1c 12.3.   NovoLog 8 units 3 times daily, d/ced on 8/14 Semglee to 30 units daily on 8/13.  Diabetic teaching             -adjust regimen as needed  Labile on 8/18, monitor for trend, discontinue Ensure 13.  Constipation.Miralax BID 14. Dry nose: scheduled saline nasal spray BID  LOS: 6 days A FACE TO FACE EVALUATION WAS PERFORMED  9/18 Bellamie Turney 11/17/2020, 10:58 AM

## 2020-11-17 NOTE — Progress Notes (Signed)
Physical Therapy Session Note  Patient Details  Name: Chelsea Mathis MRN: [id number] Date of Birth: 06-Oct-1990  Today's Date: 11/17/2020 PT Individual Time: 1310-1350 PT Individual Time Calculation (min): 40 min   Short Term Goals: Week 1:  PT Short Term Goal 1 (Week 1): pt will perform bed mobility with min A consistantly PT Short Term Goal 2 (Week 1): pt will transfer sit<>stand with LRAD and min A PT Short Term Goal 3 (Week 1): pt will ambulate 44ft with LRAD and mod A  Skilled Therapeutic Interventions/Progress Updates:    Pt received supine in bed smiling and saying hi to therapist upon entry with pt FaceTiming family/friends. NT in/out for vitals. Pt agreeable to therapy session. Upon therapist reporting plan to transition to EOB pt reports onset of R quad muscle spasm. Pt becomes tearful and starts repeatedly rubbing her thigh for pain management. Transitioned from supine>L EOB with HOB elevated and using bedrails as needed with min assist for B LE management - cuing for slow, segmental movements for pain management and keeping LEs moving together to avoid pain exacerbation. Pt 1x yelling out in pain during this transition and once on EOB requires >40minutes for decreased pain prior to initiating further mobility - pt reports premedicated and utilizes heating pad on R thigh for pain management prior to and during session. Pt repeatedly states feeling "muscle spasm" in R thigh but no palpable muscle contraction noted - attempted gentle R LE long arc quads through pt selected ROM and gentle knee flexion AROM led by pt, but pt unable to tolerate though states these neither increase nor improve her pain. Sit>stand 2x from significantly elevated EOB per pt request with min assist for lifting to stand - pt tolerated standing ~10seconds on 1st attempt with pt vocalizing discomfort and becomes tearful again stating she felt like her leg was going to give way - tolerated standing ~20seconds 2nd attempt  with pt able to attempt improved upright posture with increased hip extension but pt states this caused sudden, sharp pain to shoot down lateral side of R LE but only lasted a second. Pt declining further standing attempts and requires additional prolonged seated break for pain management prior to transitioning back to supine with mod assist for B LE management into the bed. Pt able to tolerate supine lying in flat bed for R hip flexor stretch ~15seconds prior to requesting HOB be elevated. Pt requests pillow be placed under R LE and while gently assisting with positioning of this pt shouted out in pain upon slight hip abduction movement - provided calming/relaxation and breathing strategies for pain management. Pt left supine in bed with needs in reach and bed alarm on.  Therapy Documentation Precautions:  Precautions Precautions: Fall Precaution Comments: high levels of pain Restrictions Weight Bearing Restrictions: No   Pain: Pt with significant pain throughout session despite being premedicated - provided breathing and relaxation strategies as well as rest breaks for pain management - provided ice for R anterior thigh at end of session. Additional details above.   Therapy/Group: Individual Therapy  Ginny Forth , PT, DPT, NCS, CSRS 11/17/2020, 12:35 PM

## 2020-11-17 NOTE — Progress Notes (Signed)
Occupational Therapy Session Note  Patient Details  Name: Chelsea Mathis MRN: [id number] Date of Birth: 09-17-1990  Today's Date: 11/17/2020 OT Individual Time: 9937-1696 OT Individual Time Calculation (min): 75 min    Short Term Goals: Week 1:  OT Short Term Goal 1 (Week 1): Pt will complete LB self care at sit<stand level during 2 consecutive OT sessions as evidence of improved pain mgt OT Short Term Goal 2 (Week 1): Pt will complete 1 grooming task while standing at the sink to increase standing endurance OT Short Term Goal 3 (Week 1): Pt will complete 1/3 components of donning pants using AE as needed  Skilled Therapeutic Interventions/Progress Updates:    Patient seated on commode, she notes pain is 8/10 due to not having medication yet this am.  Donns underwear while seated on commode max A.  Sit to stand from bed side commode with min A - dependent for hygiene and pants up.  SPT to w/c CGA.  She completes upper body bathing/dressing and grooming tasks with set up w/c level at sink.  Max A to donn pants, dependent for clothing management, dependent teds/slipper socks.  ADL tasks require increased time due to pain this am.  To gym via w/c - completed upper body ergometer 10 minutes.  SPT w/c to bed with min A.  Sit to supine max A for bilateral LE management.  She notes ongoing pain but improving.  She remained in bed at close of session, bed alarm set, call bell and tray table in reach.    Therapy Documentation Precautions:  Precautions Precautions: Fall Precaution Comments: high levels of pain Restrictions Weight Bearing Restrictions: No Other Treatments:     Therapy/Group: Individual Therapy  Barrie Lyme 11/17/2020, 7:31 AM

## 2020-11-18 ENCOUNTER — Inpatient Hospital Stay: Payer: Self-pay

## 2020-11-18 LAB — GLUCOSE, CAPILLARY
Glucose-Capillary: 92 mg/dL (ref 70–99)
Glucose-Capillary: 124 mg/dL — ABNORMAL HIGH (ref 70–99)
Glucose-Capillary: 86 mg/dL (ref 70–99)
Glucose-Capillary: 131 mg/dL — ABNORMAL HIGH (ref 70–99)

## 2020-11-18 MED ORDER — CHLORHEXIDINE GLUCONATE CLOTH 2 % EX PADS
6.0000 | MEDICATED_PAD | Freq: Every day | CUTANEOUS | Status: DC
  Administered 2020-11-18 – 2020-11-24 (×6): 6 via TOPICAL

## 2020-11-18 MED ORDER — SODIUM CHLORIDE 0.9% FLUSH
10.0000 mL | INTRAVENOUS | Status: DC | PRN
  Administered 2020-11-18 – 2020-11-22 (×2): 10 mL

## 2020-11-18 NOTE — Progress Notes (Signed)
Physical Therapy Session Note  Patient Details  Name: Chelsea Mathis MRN: [id number] Date of Birth: 1990-07-01  Today's Date: 11/18/2020 PT Individual Time: 0174-9449 PT Individual Time Calculation (min): 25 min   Short Term Goals: Week 1:  PT Short Term Goal 1 (Week 1): pt will perform bed mobility with min A consistantly PT Short Term Goal 2 (Week 1): pt will transfer sit<>stand with LRAD and min A PT Short Term Goal 3 (Week 1): pt will ambulate 50ft with LRAD and mod A  Skilled Therapeutic Interventions/Progress Updates:  Pt received sitting in WC in room w/heat pack on R hip, reported 8/10 pain in R hip/sacrum/LE. Nursing present to administer pain meds. Pt verbalized discouragement regarding progress in therapy, feels as though she keeps "hitting a road block". Pt repeatedly rubbing her R quad for pain relief while speaking to therapist. Provided emotional support and encouragement, as pt walked the furthest distance so far in therapy today. Pt in a lot of pain following prior PT session, provided education on rotated innominates as carryover from pervious session, pt verbalized understanding. Sit <>stand pivot from WC to EOB w/RW and CGA. Stand <>sit EOB w/heavy flop, pt yelling out in pain and repeatedly rubbing her R leg. Sit EOB <>supine very slowly, mod A for LLE management. Provided education and demonstration on posterior pelvic tilts, pt able to perform 7 very slowly and stated it was painful but tolerable. Pt encouraged to continue pos pelvic tilts between therapy sessions and at night. Pt was left supine in bed w/RLE propped on pillow, heat pack on R hip and all needs in reach.   Therapy Documentation Precautions:  Precautions Precautions: Fall Precaution Comments: high levels of pain Restrictions Weight Bearing Restrictions: No   Therapy/Group: Individual Therapy Jill Alexanders Daymion Nazaire, PT, DPT  11/18/2020, 7:47 AM

## 2020-11-18 NOTE — Progress Notes (Signed)
Physical Therapy Session Note  Patient Details  Name: Chelsea Mathis MRN: [id number] Date of Birth: [birth year]  Today's Date: 11/18/2020 PT Individual Time: 2778-2423 PT Individual Time Calculation (min): 47 min   Short Term Goals: Week 1:  PT Short Term Goal 1 (Week 1): pt will perform bed mobility with min A consistantly PT Short Term Goal 2 (Week 1): pt will transfer sit<>stand with LRAD and min A PT Short Term Goal 3 (Week 1): pt will ambulate 65ft with LRAD and mod A  Skilled Therapeutic Interventions/Progress Updates:    Patient initially getting PICC line placed so moved schedule to accommodate.  Patient in supine requesting to toilet.  Assisted each leg slowly toward EOB.  Already with L ankle wrapped.  Performed supine to sit mod A increased time with rails.  Patient sit to stand to RW min A and stand step to bed min A.  Max A toileting for clothing management and hygiene.  Patient ambulated 4' in room and w/c placed behind to sit.  Assisted in w/c to dayroom.  Patient sit to stand min A and ambulated 8' to mat with increased time using RW and CGA with shuffling steps.  Patient seated reaching to 6" step to retrieve bean bags and throw to corn hole board half and half to R then to L with grimacing while reaching more to R than L.  Patient sit to stand to RW with min A to reach laterally and toss bean bags to corn hole working on 1 hand balance while standing and reaching in prep for standing ADL's.  Patient needing CGA for balance.  Noted R hip possibly higher so palpation to PSIS in standing and pt reports severe pain than radiates down the R lateral hip with pressure in this area.  Educated about potential pelvic or SI dysfunction and need for continued core activation during mobility.  Used NT as model of increased lumbar lordosis and need for flattening of the spine and bracing with abdominals.  Patient sit to stand to RW and ambulated 8' back to w/c with min A.  Assisted in w/c to room and  left with call bell and needs in reach with next PT across hallway.   Therapy Documentation Precautions:  Precautions Precautions: Fall Precaution Comments: high levels of pain Restrictions Weight Bearing Restrictions: No General: PT Amount of Missed Time (min): 13 Minutes PT Missed Treatment Reason: Other (Comment) (PICC placement)  Pain: Pain Assessment Pain Scale: 0-10 Pain Score: 7  Pain Type: Acute pain Pain Location: Hip Pain Orientation: Right Pain Radiating Towards: rt side, hip, thigh Pain Frequency: Constant Pain Intervention(s): Medication (See eMAR)   Therapy/Group: Individual Therapy  Elray Mcgregor Sheran Lawless, PT 11/18/2020, 2:56 PM

## 2020-11-18 NOTE — Progress Notes (Signed)
Physical Therapy Session Note  Patient Details  Name: Chelsea Mathis MRN: [id number] Date of Birth: 04-02-1991  Today's Date: 11/18/2020 PT Individual Time: 1448-1856 PT Individual Time Calculation (min): 15 min   Short Term Goals: Week 1:  PT Short Term Goal 1 (Week 1): pt will perform bed mobility with min A consistantly PT Short Term Goal 2 (Week 1): pt will transfer sit<>stand with LRAD and min A PT Short Term Goal 3 (Week 1): pt will ambulate 40f with LRAD and mod A   Skilled Therapeutic Interventions/Progress Updates:   Pt received supine in bed. Pt extremely tearful; reporting that she is "over it" and "just doesn't want to be here" and sick of the pain. Therapeutic use of self to console patient with education for healing process. Pt recommended chaplin of neuropsychology consult to help with coping strategies, pt agreeable. Pt noted to have not eaten breakfast. PT encouraged food intake to help with healing process, and pt agreeable to try cereal this AM. PT assisted pt to reposition BLE in bed for pain management. Pt left in bed with improved affect and all needs met.      Therapy Documentation Precautions:  Precautions Precautions: Fall Precaution Comments: high levels of pain Restrictions Weight Bearing Restrictions: No General: PT Amount of Missed Time (min): 15 Minutes PT Missed Treatment Reason: Pain Vital Signs: Therapy Vitals Temp: 99.3 F (37.4 C) Temp Source: Oral Pulse Rate: (!) 106 Resp: 17 BP: 122/70 Patient Position (if appropriate): Lying Oxygen Therapy SpO2: 96 % O2 Device: Room Air Pain: Pain Assessment Pain Scale: 0-10 Pain Score: 7  Pain Type: Acute pain Pain Location: Hip Pain Orientation: Right Pain Radiating Towards: rt side, hip, thigh Pain Frequency: Constant Pain Intervention(s): Medication (See eMAR)   Therapy/Group: Individual Therapy  ALorie Phenix8/19/2022, 4:48 PM

## 2020-11-18 NOTE — Progress Notes (Signed)
Peripherally Inserted Central Catheter Placement  The IV Nurse has discussed with the patient and/or persons authorized to consent for the patient, the purpose of this procedure and the potential benefits and risks involved with this procedure.  The benefits include less needle sticks, lab draws from the catheter, and the patient may be discharged home with the catheter. Risks include, but not limited to, infection, bleeding, blood clot (thrombus formation), and puncture of an artery; nerve damage and irregular heartbeat and possibility to perform a PICC exchange if needed/ordered by physician.  Alternatives to this procedure were also discussed.  Bard Power PICC patient education guide, fact sheet on infection prevention and patient information card has been provided to patient /or left at bedside.    PICC Placement Documentation  PICC Single Lumen 11/18/20 Right Brachial 39 cm 1 cm (Active)  Exposed Catheter (cm) 1 cm 11/18/20 1352  Site Assessment Clean;Dry;Intact 11/18/20 1352  Line Status Flushed;Saline locked;Blood return noted 11/18/20 1352  Dressing Type Transparent;Securing device 11/18/20 1352  Dressing Status Clean;Dry;Intact 11/18/20 1352  Antimicrobial disc in place? Yes 11/18/20 1352  Safety Lock Not Applicable 11/18/20 1352       Romie Jumper 11/18/2020, 1:55 PM

## 2020-11-18 NOTE — Progress Notes (Signed)
Physical Therapy Session Note  Patient Details  Name: Chelsea Mathis MRN: [id number] Date of Birth: 09/12/1990  Today's Date: 11/18/2020 PT Individual Time: 1019-1100 PT Individual Time Calculation (min): 41 min   Short Term Goals: Week 1:  PT Short Term Goal 1 (Week 1): pt will perform bed mobility with min A consistantly PT Short Term Goal 2 (Week 1): pt will transfer sit<>stand with LRAD and min A PT Short Term Goal 3 (Week 1): pt will ambulate 41f with LRAD and mod A  Skilled Therapeutic Interventions/Progress Updates: Pt presented in w/c handoff from OT session and agreeable to therapy. Pt states 8/10 pain and premedicated. Pt transported to dayroom for time management and energy conservation. Performed stand pivot transfer to high/low mat CGA and participated in x 2 bouts of ambulation ~335feach. Pt was able to progress STS to supervision and ambulation was CGA with mild R knee instability noted. Pt required increased time for recovery however expressed no increase in pain with ambulation. Performed stand pivot to w/c and pt transported back to room. Performed ambulatory transfer to bed with RW and CGA overall. Pt required minA for sit to supine for BLE management but was able to reposition self to comfort with supervision and use of bed features. Pt left in bed with alarm on, call bell within reach and needs met.      Therapy Documentation Precautions:  Precautions Precautions: Fall Precaution Comments: high levels of pain Restrictions Weight Bearing Restrictions: No  Vital Signs:   Pain: Pain Assessment Pain Scale: 0-10 Pain Score: 8  Pain Type: Acute pain Pain Location: Hip Pain Orientation: Right Pain Radiating Towards: rt side, hip, thigh Pain Frequency: Constant Pain Intervention(s): Medication (See eMAR) Mobility:   Locomotion :    Trunk/Postural Assessment :    Balance:   Exercises:   Other Treatments:      Therapy/Group: Individual Therapy  Jamonica Schoff 11/18/2020, 4:03 PM

## 2020-11-18 NOTE — Progress Notes (Signed)
Occupational Therapy Session Note  Patient Details  Name: Chelsea Mathis MRN: [id number] Date of Birth: [birth year]  Today's Date: 11/18/2020 OT Individual Time: 3762-8315 OT Individual Time Calculation (min): 53 min    Short Term Goals: Week 1:  OT Short Term Goal 1 (Week 1): Pt will complete LB self care at sit<stand level during 2 consecutive OT sessions as evidence of improved pain mgt OT Short Term Goal 2 (Week 1): Pt will complete 1 grooming task while standing at the sink to increase standing endurance OT Short Term Goal 3 (Week 1): Pt will complete 1/3 components of donning pants using AE as needed  Skilled Therapeutic Interventions/Progress Updates:    Patient seen for ADL this am.  Patient in bed upon arrival - reporting pain 8/10 in LE's.  Spoke with nurse, and she was checking to see when pain meds could be re-issued.  Patient assisted to edge of bed with much increased time due to pain.  Patient bracing with every attempt to move leading to increased pain - did better with slow movement and with distraction - talking about family.  Left ankle wrapped with ace wrap. Patient ate breakfast at edge of bed, then assisted to wheelchair with min assist and walker.    Patient willing to work with therapy despite pain 8-9/10 throughout session.  Brushed teeth once brought to sink and items within reach.  Able to don pull over shirt with nurse assisting to disconnect line.  Max assist to don pants due to time constraints and pain.  Patient handed over to PT for next session.    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

## 2020-11-18 NOTE — Progress Notes (Signed)
[facility] PHYSICAL MEDICINE & REHABILITATION PROGRESS NOTE  Subjective/Complaints: She said the saline spray helped a lot more and her pain is more tolerable today No new complaints  ROS: Denies CP, SOB, N/V/D, +nosebleed, pain is more tolerable  Objective: Vital Signs: Blood pressure 120/69, pulse 92, temperature 98.5 F (36.9 C), temperature source Oral, resp. rate 18, height 5\' 4"  (1.626 m), weight 90.6 kg, last menstrual period 10/31/2020, SpO2 97 %, unknown if currently breastfeeding. 12/31/2020 EKG SITE RITE  Result Date: 11/18/2020 If Site Rite image not attached, placement could not be confirmed due to current cardiac rhythm.  No results for input(s): WBC, HGB, HCT, PLT in the last 72 hours.  No results for input(s): NA, K, CL, CO2, GLUCOSE, BUN, CREATININE, CALCIUM in the last 72 hours.   Intake/Output Summary (Last 24 hours) at 11/18/2020 1323 Last data filed at 11/18/2020 0035 Gross per 24 hour  Intake 180 ml  Output 600 ml  Net -420 ml        Physical Exam: BP 120/69 (BP Location: Left Leg)   Pulse 92   Temp 98.5 F (36.9 C) (Oral)   Resp 18   Ht 5\' 4"  (1.626 m)   Wt 90.6 kg   LMP 10/31/2020   SpO2 97%   BMI 34.28 kg/m  Constitutional: No distress . Vital signs reviewed. HENT: Normocephalic.  Atraumatic. Eyes: EOMI. No discharge. Cardiovascular: No JVD.  + Tachycardia.  + Murmur. Respiratory: Normal effort.  No stridor.  Bilateral clear to auscultation. GI: Non-distended.  BS +. Skin: Swelling over LUE and dorsum of right wrist Psych: Flat.  Normal behavior. Musc: Right lower extremity edema.  No tenderness in extremities. Pain present with hip extension Neuro: Alert Motor: Left lower extremity: 4 -/5 proximal to distal (pain inhibition), stable Right lower extremity: Hip flexion, knee extension 2+/5, ankle dorsiflexion 4 -/5 (pain inhibition), stable  Assessment/Plan: 1. Functional deficits which require 3+ hours per day of interdisciplinary therapy in a  comprehensive inpatient rehab setting. Physiatrist is providing close team supervision and 24 hour management of active medical problems listed below. Physiatrist and rehab team continue to assess barriers to discharge/monitor patient progress toward functional and medical goals   Care Tool:  Bathing    Body parts bathed by patient: Right arm, Left arm, Chest, Abdomen, Right upper leg, Left upper leg, Face   Body parts bathed by helper: Front perineal area, Buttocks     Bathing assist Assist Level: Minimal Assistance - Patient > 75%     Upper Body Dressing/Undressing Upper body dressing   What is the patient wearing?: Pull over shirt    Upper body assist Assist Level: Set up assist    Lower Body Dressing/Undressing Lower body dressing    Lower body dressing activity did not occur: Safety/medical concerns What is the patient wearing?: Underwear/pull up, Pants     Lower body assist Assist for lower body dressing: Maximal Assistance - Patient 25 - 49%     Toileting Toileting Toileting Activity did not occur (Clothing management and hygiene only): N/A (no void or bm)  Toileting assist Assist for toileting: Total Assistance - Patient < 25%     Transfers Chair/bed transfer  Transfers assist     Chair/bed transfer assist level: Contact Guard/Touching assist     Locomotion Ambulation   Ambulation assist   Ambulation activity did not occur: Safety/medical concerns (fatigue, pain, decreased balance/postural control)  Assist level: Contact Guard/Touching assist   Max distance: 56ft   Walk 10  feet activity   Assist  Walk 10 feet activity did not occur: Safety/medical concerns (fatigue, pain, decreased balance/postural control)  Assist level: Contact Guard/Touching assist Assistive device: Walker-rolling   Walk 50 feet activity   Assist Walk 50 feet with 2 turns activity did not occur: Safety/medical concerns (fatigue, pain, decreased balance/postural  control)         Walk 150 feet activity   Assist Walk 150 feet activity did not occur: Safety/medical concerns (fatigue, pain, decreased balance/postural control)         Walk 10 feet on uneven surface  activity   Assist Walk 10 feet on uneven surfaces activity did not occur: Safety/medical concerns (fatigue, pain, decreased balance/postural control)         Wheelchair     Assist Will patient use wheelchair at discharge?: Yes Type of Wheelchair: Manual Wheelchair activity did not occur: Safety/medical concerns (fatigue, pain, decreased balance/postural control)  Wheelchair assist level: Supervision/Verbal cueing Max wheelchair distance: 28'    Wheelchair 50 feet with 2 turns activity    Assist    Wheelchair 50 feet with 2 turns activity did not occur: Safety/medical concerns (fatigue, pain, decreased balance/postural control)   Assist Level: Supervision/Verbal cueing   Wheelchair 150 feet activity     Assist  Wheelchair 150 feet activity did not occur: Safety/medical concerns (fatigue, pain, decreased balance/postural control)        Medical Problem List and Plan: 1.   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  Antithrombotics: -DVT/anticoagulation: Venous Doppler studies negative Mechanical:  Antiembolism stockings, knee (TED hose) Bilateral lower extremities Sequential compression devices, below knee Bilateral lower extremities             -antiplatelet therapy: N/A 3. Lower extremity pain:  Robaxin scheduled on 8/14             -kpad for muscular pain in legs  Continue OxyContin started on 8/13  Continue gabapentin 300mg  TID  Relatively controlled on 8/18 4. Anxiety: continue Atarax 25 mg twice daily as needed anxiety as well as 50 mg nightly as needed insomnia             -antipsychotic agents: N/A 5. Neuropsych: This patient is capable of making decisions  on her  own behalf. 6. LUE forearm and RUE dorsum of hand IV infiltrations:  Placed nursing order for warm compress 15 minutes TID. Educated patient regarding benefits of this for pain control. PICC to be placed toady 7. Fluids/Electrolytes/Nutrition: Routine in and outs 8.  ID/Bacteremia/MRSA endocarditis.  Follow-up infectious disease..  Continue vancomycin through 12/19/2020 9.    Acute pulmonary emboli felt to be secondary to septic pulmonary emboli .initially on heparin which was discontinued as patient developed anemia.  Lower extremity Dopplers negative.  Pulmonary was involved no recommendations for ongoing anticoagulation at this time 10.  Anemia/leukocytosis: improving, monitor weekly 11.  Hyponatremia.   Sodium 132 on 8/15  Continue to monitor 12.  Poorly controlled diabetes mellitus.  Hemoglobin A1c 12.3.   NovoLog 8 units 3 times daily, d/ced on 8/14 Semglee to 30 units daily on 8/13.  Diabetic teaching             -adjust regimen as needed  Labile on 8/18, monitor for trend, discontinue Ensure 13.  Constipation.Miralax BID 14. Dry nose: scheduled saline nasal spray BID  LOS: 7 days A FACE TO FACE EVALUATION WAS PERFORMED  9/18 Aden Youngman 11/18/2020,  1:23 PM

## 2020-11-19 LAB — GLUCOSE, CAPILLARY
Glucose-Capillary: 92 mg/dL (ref 70–99)
Glucose-Capillary: 129 mg/dL — ABNORMAL HIGH (ref 70–99)
Glucose-Capillary: 115 mg/dL — ABNORMAL HIGH (ref 70–99)
Glucose-Capillary: 156 mg/dL — ABNORMAL HIGH (ref 70–99)

## 2020-11-19 NOTE — Plan of Care (Signed)
  Problem: Consults Goal: RH GENERAL PATIENT EDUCATION Description: See Patient Education module for education specifics. Outcome: Progressing Goal: Diabetes Guidelines if Diabetic/Glucose > 140 Description: If diabetic or lab glucose is > 140 mg/dl - Initiate Diabetes/Hyperglycemia Guidelines & Document Interventions  Outcome: Progressing   Problem: RH SAFETY Goal: RH STG DECREASED RISK OF FALL WITH ASSISTANCE Description: STG Decreased Risk of Fall With cues and reminders. Outcome: Progressing   Problem: RH PAIN MANAGEMENT Goal: RH STG PAIN MANAGED AT OR BELOW PT'S PAIN GOAL Description: < 3 on a 0-10 pain scale. Outcome: Progressing   Problem: RH KNOWLEDGE DEFICIT GENERAL Goal: RH STG INCREASE KNOWLEDGE OF SELF CARE AFTER HOSPITALIZATION Description: Patient will demonstrate knowledge of medication management, pain management, safety awareness with educational materials and handouts provided by staff independently at discharge. Outcome: Progressing   

## 2020-11-19 NOTE — Progress Notes (Signed)
Occupational Therapy Session Note  Patient Details  Name: Chelsea Mathis MRN: [id number] Date of Birth: Apr 17, 1990  Today's Date: 11/20/2020 OT Individual Time: 4481-8563 OT Individual Time Calculation (min): 84 min    Short Term Goals: Week 1:  OT Short Term Goal 1 (Week 1): Pt will complete LB self care at sit<stand level during 2 consecutive OT sessions as evidence of improved pain mgt OT Short Term Goal 1 - Progress (Week 1): Met OT Short Term Goal 2 (Week 1): Pt will complete 1 grooming task while standing at the sink to increase standing endurance OT Short Term Goal 2 - Progress (Week 1): Progressing toward goal OT Short Term Goal 3 (Week 1): Pt will complete 1/3 components of donning pants using AE as needed OT Short Term Goal 3 - Progress (Week 1): Progressing toward goal  Skilled Therapeutic Interventions/Progress Updates:    Pt greeted in bed, premedicated for pain. She requested to start session by using the BSC vs toilet. Setup for bed mobility with increased time, HOB elevated and using the bedrail. CGA for stand pivot<BSC and pt able to manage her hospital gown. Pt with +bladder void, hygiene completed in standing with the same assistance, pt cleansing both frontal and posterior periareas. She then returned to EOB to proceed with bathing/dressing tasks. Setup for UB self care, pt donning a new hospital gown due to PICC line running. Max A for LB self care, dressing the Rt LE first. Pt would benefit from trialing and reacher + sock aide at a later date (reachers at this time not in supply). CGA for sit<stands and for dynamic standing balance using RW. Max A to elevate very tight pants. Oral care completed while seated with setup assistance, pt opting to brush teeth EOB vs transfer to w/c and brush teeth at the sink. She remained in bed at close of session, opting to not transfer OOB at this time. OT ACE wrapped the Lt ankle for support prior to departure.   Therapy  Documentation Precautions:  Precautions Precautions: Fall Precaution Comments: high levels of pain Restrictions Weight Bearing Restrictions: No Pain: Pain Assessment Pain Scale: 0-10 Pain Score: 7  Pain Type: Acute pain Pain Location: Leg Pain Orientation: Right Pain Radiating Towards: right hip Pain Descriptors / Indicators: Aching Pain Frequency: Constant Pain Onset: On-going Patients Stated Pain Goal: 4 Pain Intervention(s): Medication (See eMAR) ADL: ADL Grooming: Setup Where Assessed-Grooming: Edge of bed Upper Body Bathing: Setup Where Assessed-Upper Body Bathing: Edge of bed Lower Body Bathing: Moderate assistance Where Assessed-Lower Body Bathing: Edge of bed, Bed level Upper Body Dressing: Setup Where Assessed-Upper Body Dressing: Edge of bed Lower Body Dressing: Dependent Where Assessed-Lower Body Dressing: Edge of bed, Bed level Toileting: Not assessed Toilet Transfer: Moderate assistance (per PT eval) Toilet Transfer Method: Stand pivot (RW) Toilet Transfer Equipment: Bedside commode Tub/Shower Transfer: Not assessed          :  :     Therapy/Group: Individual Therapy  Dede Dobesh A Jacquita Mulhearn 11/20/2020, 12:32 PM

## 2020-11-19 NOTE — Progress Notes (Signed)
Pharmacy Antibiotic Note  Chelsea Mathis is a 30 y.o. 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  Pharmacy was consulted for vancomycin dosing. Last vancomycin peak/trough drawn 8/17, vancomycin AUC = 481 (within goal range of 400-550).   WBC 13.1 (8/15), Tmax 98 F; Scr (8/15) 0.46, up slightly; CrCl 105 ml/min  Plan: Continue Vancomycin 1750 mg IV Q 12 hrs (end date 12/19/20 ) Monitor weekly vancomycin levels (check renal function on same day), next due 8/22 Monitor WBC, temp, clinical improvement, renal function  Height: 5\' 4"  (162.6 cm) Weight: 83.9 kg (184 lb 15.5 oz) IBW/kg (Calculated) : 54.7  Temp (24hrs), Avg:99.4 F (37.4 C), Min:98 F (36.7 C), Max:100.2 F (37.9 C)  Recent Labs  Lab 11/14/20 1251 11/16/20 1236 11/16/20 1924  WBC 13.1*  --   --   CREATININE 0.46  --   --   VANCOTROUGH  --   --  10*  VANCOPEAK  --  26*  --      Estimated Creatinine Clearance: 105.8 mL/min (by C-G formula based on SCr of 0.46 mg/dL).    No Known Allergies  Antimicrobials this admission: Vancomycin  8/1 >> (12/19/20) Zosyn 8/1 >> 8/2 Clindamycin  x 1 8/1 Cefepime 8/6 >> 8/9 Mupirocin nasal 8/3 >> 8/9  Microbiology results: 8/1 BCx: 2/2 MRSA 8/1 urine: multiple species. Recollection suggested 8/1 COVID, flu A/B, HIV screen: negative 8/1 MRSA PCR: positive 8/3 HCV Aby negative 8/2 BCx x 2: NG/final 8/5 BCx X 2: 1/4 btls with MRSA 8/6 BCx  X 2: 1/4 btls with MRSA 8/8 Bcx X 2: NG/final  Thank you for involving pharmacy in this patient's care.  10/1, PharmD PGY1 Ambulatory Care Pharmacy Resident 11/19/2020 11:12 AM  **Pharmacist phone directory can be found on [email or web address] listed under [facility]**

## 2020-11-20 DIAGNOSIS — M60005 Infective myositis, unspecified leg: Secondary | ICD-10-CM

## 2020-11-20 LAB — GLUCOSE, CAPILLARY
Glucose-Capillary: 94 mg/dL (ref 70–99)
Glucose-Capillary: 153 mg/dL — ABNORMAL HIGH (ref 70–99)
Glucose-Capillary: 78 mg/dL (ref 70–99)
Glucose-Capillary: 125 mg/dL — ABNORMAL HIGH (ref 70–99)

## 2020-11-20 NOTE — Progress Notes (Signed)
Occupational Therapy Weekly Progress Note  Patient Details  Name: Chelsea Mathis MRN: [id number] Date of Birth: 1990/05/09  Beginning of progress report period: 11/12/2020 End of progress report period: 11/20/2020   Patient has met 1 of 3 short term goals.    Pt has made functional progress at time of report. At time of evaluation, pt was unable to tolerate standing for ADLs due to B LE pain, needed to complete LB self care + toileting at bedlevel. At time of report, pts pain is better controlled and she is able to engage in LB self care + toileting tasks at sit<stand level given CGA-Min A using RW. She reports having the Lt ankle ACE wrapped is helping her manage pain during mobility. Will trial AE to further increase functional independence with LB self care during next report period.  Patient continues to demonstrate the following deficits: muscle weakness, decreased cardiorespiratoy endurance and pain, and decreased standing balance and decreased balance strategies and therefore will continue to benefit from skilled OT intervention to enhance overall performance with BADL.  Patient progressing toward long term goals..  Continue plan of care.  OT Short Term Goals Week 1:  OT Short Term Goal 1 (Week 1): Pt will complete LB self care at sit<stand level during 2 consecutive OT sessions as evidence of improved pain mgt OT Short Term Goal 1 - Progress (Week 1): Met OT Short Term Goal 2 (Week 1): Pt will complete 1 grooming task while standing at the sink to increase standing endurance OT Short Term Goal 2 - Progress (Week 1): Progressing toward goal OT Short Term Goal 3 (Week 1): Pt will complete 1/3 components of donning pants using AE as needed OT Short Term Goal 3 - Progress (Week 1): Progressing toward goal Week 2:  OT Short Term Goal 1 (Week 2): Pt will complete 1/3 components of donning pants using AE as needed OT Short Term Goal 2 (Week 2): Pt will complete 1 grooming task while standing  at the sink OT Short Term Goal 3 (Week 2): Pt will complete bathing at sit<stand level with CGA using AE as needed      Therapy Documentation Precautions:  Precautions Precautions: Fall Precaution Comments: high levels of pain Restrictions Weight Bearing Restrictions: No General:   Vital Signs: Therapy Vitals Temp: 98.6 F (37 C) Temp Source: Oral Pulse Rate: 94 Resp: 20 BP: 122/71 Patient Position (if appropriate): Lying Oxygen Therapy SpO2: 97 % O2 Device: Room Air Pain: Pain Assessment Pain Scale: 0-10 Pain Score: 7  Pain Type: Acute pain Pain Location: Leg Pain Orientation: Right Pain Radiating Towards: right hip Pain Descriptors / Indicators: Aching Pain Frequency: Constant Pain Onset: On-going Patients Stated Pain Goal: 4 Pain Intervention(s): Medication (See eMAR) ADL: ADL Grooming: Setup Where Assessed-Grooming: Edge of bed Upper Body Bathing: Setup Where Assessed-Upper Body Bathing: Edge of bed Lower Body Bathing: Moderate assistance Where Assessed-Lower Body Bathing: Edge of bed, Bed level Upper Body Dressing: Setup Where Assessed-Upper Body Dressing: Edge of bed Lower Body Dressing: Dependent Where Assessed-Lower Body Dressing: Edge of bed, Bed level Toileting: Not assessed Toilet Transfer: Moderate assistance (per PT eval) Toilet Transfer Method: Stand pivot (RW) Toilet Transfer Equipment: Bedside commode Tub/Shower Transfer: Not assessed :     Therapy/Group: Individual Therapy  Chelsea Mathis A Chelsea Mathis 11/20/2020, 10:40 AM

## 2020-11-20 NOTE — Progress Notes (Signed)
[place] PHYSICAL MEDICINE & REHABILITATION PROGRESS NOTE  Subjective/Complaints:  Some left ankle pain , thinks she may have injured during a transfer , no swelling, some pain with WB, feels better when PT wraps ankle with ACE  ROS: Denies CP, SOB, N/V/D,   Objective: Vital Signs: Blood pressure 122/71, pulse 94, temperature 98.6 F (37 C), temperature source Oral, resp. rate 20, height 5\' 4"  (1.626 m), weight 84 kg, last menstrual period 10/31/2020, SpO2 97 %, unknown if currently breastfeeding. 12/31/2020 EKG SITE RITE  Result Date: 11/18/2020 If Site Rite image not attached, placement could not be confirmed due to current cardiac rhythm.  No results for input(s): WBC, HGB, HCT, PLT in the last 72 hours.  No results for input(s): NA, K, CL, CO2, GLUCOSE, BUN, CREATININE, CALCIUM in the last 72 hours.   Intake/Output Summary (Last 24 hours) at 11/20/2020 0938 Last data filed at 11/19/2020 2306 Gross per 24 hour  Intake 360 ml  Output 650 ml  Net -290 ml         Physical Exam: BP 122/71 (BP Location: Left Leg)   Pulse 94   Temp 98.6 F (37 C) (Oral)   Resp 20   Ht 5\' 4"  (1.626 m)   Wt 84 kg   LMP 10/31/2020   SpO2 97%   BMI 31.79 kg/m   General: No acute distress Mood and affect are appropriate Heart: Regular rate and rhythm no rubs murmurs or extra sounds Lungs: Clear to auscultation, breathing unlabored, no rales or wheezes Abdomen: Positive bowel sounds, soft nontender to palpation, nondistended Extremities: No clubbing, cyanosis, or edema Skin: No evidence of breakdown, no evidence of rash   Musc: Right lower extremity edema.  No tenderness in extremities.tenderness along RIght lateral thigh Left ankle without swelling or ecchymosis, mild TTP over deltoid ligament  Neuro: Alert Motor: Left lower extremity: 4 -/5 proximal to distal (pain inhibition), stable Right lower extremity: Hip flexion, knee extension 2+/5, ankle dorsiflexion 4 -/5 (pain inhibition),  stable  Assessment/Plan: 1. Functional deficits which require 3+ hours per day of interdisciplinary therapy in a comprehensive inpatient rehab setting. Physiatrist is providing close team supervision and 24 hour management of active medical problems listed below. Physiatrist and rehab team continue to assess barriers to discharge/monitor patient progress toward functional and medical goals   Care Tool:  Bathing    Body parts bathed by patient: Right arm, Left arm, Chest, Abdomen, Right upper leg, Left upper leg, Face   Body parts bathed by helper: Front perineal area, Buttocks     Bathing assist Assist Level: Minimal Assistance - Patient > 75%     Upper Body Dressing/Undressing Upper body dressing   What is the patient wearing?: Pull over shirt    Upper body assist Assist Level: Set up assist    Lower Body Dressing/Undressing Lower body dressing    Lower body dressing activity did not occur: Safety/medical concerns What is the patient wearing?: Underwear/pull up, Pants     Lower body assist Assist for lower body dressing: Maximal Assistance - Patient 25 - 49%     Toileting Toileting Toileting Activity did not occur (Clothing management and hygiene only): N/A (no void or bm)  Toileting assist Assist for toileting: Total Assistance - Patient < 25%     Transfers Chair/bed transfer  Transfers assist     Chair/bed transfer assist level: Contact Guard/Touching assist     Locomotion Ambulation   Ambulation assist   Ambulation activity did not occur: Safety/medical  concerns (fatigue, pain, decreased balance/postural control)  Assist level: Contact Guard/Touching assist   Max distance: 69ft   Walk 10 feet activity   Assist  Walk 10 feet activity did not occur: Safety/medical concerns (fatigue, pain, decreased balance/postural control)  Assist level: Contact Guard/Touching assist Assistive device: Walker-rolling   Walk 50 feet activity   Assist Walk 50  feet with 2 turns activity did not occur: Safety/medical concerns (fatigue, pain, decreased balance/postural control)         Walk 150 feet activity   Assist Walk 150 feet activity did not occur: Safety/medical concerns (fatigue, pain, decreased balance/postural control)         Walk 10 feet on uneven surface  activity   Assist Walk 10 feet on uneven surfaces activity did not occur: Safety/medical concerns (fatigue, pain, decreased balance/postural control)         Wheelchair     Assist Will patient use wheelchair at discharge?: Yes Type of Wheelchair: Manual Wheelchair activity did not occur: Safety/medical concerns (fatigue, pain, decreased balance/postural control)  Wheelchair assist level: Supervision/Verbal cueing Max wheelchair distance: 52'    Wheelchair 50 feet with 2 turns activity    Assist    Wheelchair 50 feet with 2 turns activity did not occur: Safety/medical concerns (fatigue, pain, decreased balance/postural control)   Assist Level: Supervision/Verbal cueing   Wheelchair 150 feet activity     Assist  Wheelchair 150 feet activity did not occur: Safety/medical concerns (fatigue, pain, decreased balance/postural control)        Medical Problem List and Plan: 1.   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  Antithrombotics: -DVT/anticoagulation: Venous Doppler studies negative Mechanical:  Antiembolism stockings, knee (TED hose) Bilateral lower extremities Sequential compression devices, below knee Bilateral lower extremities             -antiplatelet therapy: N/A 3. Lower extremity pain:  Robaxin scheduled on 8/14             -kpad for muscular pain in legs  Continue OxyContin started on 8/13  Continue gabapentin 300mg  TID  Relatively controlled on 8/18 Left ankle pain likely mild liagamentous strain, may ice after therapy and wrap prior to  therapy  4. Anxiety: continue Atarax 25 mg twice daily as needed anxiety as well as 50 mg nightly as needed insomnia             -antipsychotic agents: N/A 5. Neuropsych: This patient is capable of making decisions on her  own behalf. 6. LUE forearm and RUE dorsum of hand IV infiltrations:  Placed nursing order for warm compress 15 minutes TID. Educated patient regarding benefits of this for pain control. PICC to be placed toady 7. Fluids/Electrolytes/Nutrition: Routine in and outs 8.  ID/Bacteremia/MRSA endocarditis.  Follow-up infectious disease..  Continue vancomycin through 12/19/2020 9.    Acute pulmonary emboli felt to be secondary to septic pulmonary emboli .initially on heparin which was discontinued as patient developed anemia.  Lower extremity Dopplers negative.  Pulmonary was involved no recommendations for ongoing anticoagulation at this time 10.  Anemia/leukocytosis: improving, monitor weekly 11.  Hyponatremia.   Sodium 132 on 8/15  Continue to monitor 12.  Poorly controlled diabetes mellitus.  Hemoglobin A1c 12.3.   NovoLog 8 units 3 times daily, d/ced on 8/14 Semglee to 30 units daily on 8/13.  Diabetic teaching             -adjust regimen  as needed  Labile on 8/18, monitor for trend, discontinue Ensure CBG (last 3)  Recent Labs    11/19/20 1627 11/19/20 2100 11/20/20 0628  GLUCAP 115* 156* 94  Controlled 8/21  13.  Constipation.Miralax BID 14. Dry nose: scheduled saline nasal spray BID  LOS: 9 days A FACE TO FACE EVALUATION WAS PERFORMED  Erick Colace 11/20/2020, 9:38 AM

## 2020-11-21 ENCOUNTER — Inpatient Hospital Stay: Payer: Medicaid Other | Admitting: Infectious Diseases

## 2020-11-21 LAB — GLUCOSE, CAPILLARY
Glucose-Capillary: 89 mg/dL (ref 70–99)
Glucose-Capillary: 137 mg/dL — ABNORMAL HIGH (ref 70–99)
Glucose-Capillary: 86 mg/dL (ref 70–99)
Glucose-Capillary: 102 mg/dL — ABNORMAL HIGH (ref 70–99)
Glucose-Capillary: 116 mg/dL — ABNORMAL HIGH (ref 70–99)

## 2020-11-21 MED ORDER — DICLOFENAC SODIUM 1 % EX GEL
2.0000 g | Freq: Four times a day (QID) | CUTANEOUS | Status: DC
  Administered 2020-11-21 – 2020-11-29 (×32): 2 g via TOPICAL
  Filled 2020-11-21 (×2): qty 100

## 2020-11-21 MED ORDER — METHOCARBAMOL 750 MG PO TABS
750.0000 mg | ORAL_TABLET | Freq: Four times a day (QID) | ORAL | Status: DC
  Administered 2020-11-21 – 2020-11-27 (×23): 750 mg via ORAL
  Filled 2020-11-21 (×23): qty 1

## 2020-11-21 NOTE — Progress Notes (Signed)
Physical Therapy Weekly Progress Note  Patient Details  Name: Chelsea Mathis MRN: [id number] Date of Birth: 1990/12/09  Beginning of progress report period: November 12, 2020 End of progress report period: November 21, 2020  Today's Date: 11/21/2020 PT Individual Time: 1001-1057 PT Individual Time Calculation (min): 56 min   Patient has met 2 of 3 short term goals.  Pt fluctuates in status 2/2 uncontrolled pain and muscle stiffness. Pt is progressing towards consistency w/bed mobility 2/2 changes in pain status. Pt has improved her weightbearing tolerance and is ambulating up to 28' w/RW and supervision.   Patient continues to demonstrate the following deficits muscle weakness and muscle joint tightness and decreased standing balance and decreased postural control and therefore will continue to benefit from skilled PT intervention to increase functional independence with mobility.  Patient progressing toward long term goals..  Continue plan of care.  PT Short Term Goals Week 1:  PT Short Term Goal 1 (Week 1): pt will perform bed mobility with min A consistantly PT Short Term Goal 1 - Progress (Week 1): Progressing toward goal PT Short Term Goal 2 (Week 1): pt will transfer sit<>stand with LRAD and min A PT Short Term Goal 2 - Progress (Week 1): Met PT Short Term Goal 3 (Week 1): pt will ambulate 21f with LRAD and mod A PT Short Term Goal 3 - Progress (Week 1): Met Week 2:  PT Short Term Goal 1 (Week 2): LTG = STG due to LOS  Skilled Therapeutic Interventions/Progress Updates:  Pt received supine in bed, flat affect, reported pain as 5/10 in R hip/LE. Pt reported she had "heavy" things on her mind, offered emotional support and active listening, pt declined. Emphasis of session on lateral weight shifting and improved weightbearing tolerance. Pt performed bed mobility to R side w/supervision slowly, but improved from previous week. Sit <> stand from elevated bed w/RW and pt ambulated [facility]' to [facility] slowly w/supervision. Noted increased step length and clearance bilaterally, erect posture and increased cadence. Pt transported to day gym total A for time management. Sit <>stands throughout session w/supervision.   Gait Training -Pt ambulated 55', 35' and 30' w/RW and supervision, short sitting rest breaks in between trials. Pt maintained pain as 6/10 throughout walks. As pt fatigued, step length of LLE decreased 2/2 decreased stance time on RLE. Pt maintained erect posture well and is bearing more weight on RLE.  -in // bars, alt. Toe taps to 2" step for improved lateral weight shift and priming for stair training. Pt performed 4 taps per leg w/BUE support on bars and supervision. Noted pt used posterior lean and momentum to ascend RLE, but was able to clear step. Pain reported as 7/10.  Pt was transported back to room w/total A 2/2 fatigue and was left sitting in WC in room w/heat pack on R hip and all needs in reach.   Therapy Documentation Precautions:  Precautions Precautions: Fall Precaution Comments: high levels of pain Restrictions Weight Bearing Restrictions: No  Therapy/Group: Individual Therapy  JCruzita LedererPlaster, PT, DPT 11/21/2020, 7:46 AM

## 2020-11-21 NOTE — Progress Notes (Signed)
Occupational Therapy Session Note  Patient Details  Name: Chelsea Mathis MRN: [id number] Date of Birth: October 16, 1990  Today's Date: 11/21/2020 OT Individual Time: 1100-1155 OT Individual Time Calculation (min): 55 min    Short Term Goals: Week 2:  OT Short Term Goal 1 (Week 2): Pt will complete 1/3 components of donning pants using AE as needed OT Short Term Goal 2 (Week 2): Pt will complete 1 grooming task while standing at the sink OT Short Term Goal 3 (Week 2): Pt will complete bathing at sit<stand level with CGA using AE as needed  Skilled Therapeutic Interventions/Progress Updates:    Patient seated in w/c, ready for therapy session.   She notes pain in right leg at level 7/10.  Assisted to therapy gym via w/c.  Completed upper body ergometer 2 x 4 minutes.  SPT to mat table CGA.  She tolerated unsupported sitting with focus on core, trunk mobility, reaching, pelvic mobility, hip stretches.  Standing stepping, weight shift and hip coordination activities with rest breaks between.  Ambulation in room with RW CGA to recliner where she remained at close of session, call bell and tray table in reach.     Therapy Documentation Precautions:  Precautions Precautions: Fall Precaution Comments: high levels of pain Restrictions Weight Bearing Restrictions: No   Therapy/Group: Individual Therapy  Barrie Lyme 11/21/2020, 7:34 AM

## 2020-11-21 NOTE — Progress Notes (Signed)
Occupational Therapy Session Note  Patient Details  Name: Chelsea Mathis MRN: [id number] Date of Birth: [birth year]  Today's Date: 11/21/2020 OT Individual Time: 0800-0900 OT Individual Time Calculation (min): 60 min    Short Term Goals: Week 2:  OT Short Term Goal 1 (Week 2): Pt will complete 1/3 components of donning pants using AE as needed OT Short Term Goal 2 (Week 2): Pt will complete 1 grooming task while standing at the sink OT Short Term Goal 3 (Week 2): Pt will complete bathing at sit<stand level with CGA using AE as needed  Skilled Therapeutic Interventions/Progress Updates:   Pt greeted supine in bed agreeable to OT intervention. Pt completed bed mobility MIN A with increased time and effort with heavy utlization of bed features, MIN A needed to scoot hips to EOB. Pt completed toilet transfer to [facility] with RW and CGA. Pt + bowel, bladder. Pt completed posterior/ anterior pericare via lateral leans with set- up but ulitmialtey required MOD A  for cleanliness for posterior pericare in standing. Pt completed stand pivot transfer from BSC>w/c with RW and CGA. Pt completed seated bathing from w/c level at sink with overall MIN A, needing assist mostly for posterior pericare. Set- up assist for UB dressing, and MAX A for LB Dressing via sit<>stand. CGA for stand pivot transfer from w/c>EOB with RW. Pt left seated EOB with RN present giving meds.   Therapy Documentation Precautions:  Precautions Precautions: Fall Precaution Comments: high levels of pain Restrictions Weight Bearing Restrictions: No  Pain: Pt reports 7/10 pain in BLEs, called RN for pain meds, encouraged rest breaks, heat and repositioning as pain mgmt strategies.    Therapy/Group: Individual Therapy  Barron Schmid 11/21/2020, 12:17 PM

## 2020-11-21 NOTE — Progress Notes (Signed)
[name] PHYSICAL MEDICINE & REHABILITATION PROGRESS NOTE  Subjective/Complaints: Right thigh pain that is bringing her to tears- just received her pain medication and is waiting for this to kick in.  ROS: Denies CP, SOB, N/V/D, +right thigh pain  Objective: Vital Signs: Blood pressure (!) 137/95, pulse (!) 110, temperature 98.5 F (36.9 C), temperature source Oral, resp. rate 16, height 5\' 4"  (1.626 m), weight 82.3 kg, last menstrual period 10/31/2020, SpO2 98 %, unknown if currently breastfeeding. No results found. No results for input(s): WBC, HGB, HCT, PLT in the last 72 hours.  No results for input(s): NA, K, CL, CO2, GLUCOSE, BUN, CREATININE, CALCIUM in the last 72 hours.   Intake/Output Summary (Last 24 hours) at 11/21/2020 1305 Last data filed at 11/21/2020 0350 Gross per 24 hour  Intake 480 ml  Output 600 ml  Net -120 ml        Physical Exam: BP (!) 137/95   Pulse (!) 110   Temp 98.5 F (36.9 C) (Oral)   Resp 16   Ht 5\' 4"  (1.626 m)   Wt 82.3 kg   LMP 10/31/2020   SpO2 98%   BMI 31.14 kg/m   Gen: no distress, normal appearing HEENT: oral mucosa pink and moist, NCAT Cardio: Reg rate Chest: normal effort, normal rate of breathing Abd: soft, non-distended Ext: no edema Psych: pleasant, normal affect Skin: intact  Musc: Right lower extremity edema.  No tenderness in extremities.tenderness along RIght lateral thigh Left ankle without swelling or ecchymosis, mild TTP over deltoid ligament  Neuro: Alert Motor: Left lower extremity: 4 -/5 proximal to distal (pain inhibition), stable Right lower extremity: Hip flexion, knee extension 2+/5, ankle dorsiflexion 4 -/5 (pain inhibition), stable  Assessment/Plan: 1. Functional deficits which require 3+ hours per day of interdisciplinary therapy in a comprehensive inpatient rehab setting. Physiatrist is providing close team supervision and 24 hour management of active medical problems listed below. Physiatrist and  rehab team continue to assess barriers to discharge/monitor patient progress toward functional and medical goals   Care Tool:  Bathing    Body parts bathed by patient: Right arm, Left arm, Chest, Abdomen, Front perineal area, Right upper leg, Left upper leg, Right lower leg, Left lower leg, Face   Body parts bathed by helper: Buttocks     Bathing assist Assist Level: Minimal Assistance - Patient > 75%     Upper Body Dressing/Undressing Upper body dressing   What is the patient wearing?: Pull over shirt    Upper body assist Assist Level: Set up assist    Lower Body Dressing/Undressing Lower body dressing    Lower body dressing activity did not occur: Safety/medical concerns What is the patient wearing?: Underwear/pull up, Pants     Lower body assist Assist for lower body dressing: Maximal Assistance - Patient 25 - 49%     Toileting Toileting Toileting Activity did not occur (Clothing management and hygiene only): N/A (no void or bm)  Toileting assist Assist for toileting: Moderate Assistance - Patient 50 - 74%     Transfers Chair/bed transfer  Transfers assist     Chair/bed transfer assist level: Contact Guard/Touching assist     Locomotion Ambulation   Ambulation assist   Ambulation activity did not occur: Safety/medical concerns (fatigue, pain, decreased balance/postural control)  Assist level: Contact Guard/Touching assist   Max distance: 38ft   Walk 10 feet activity   Assist  Walk 10 feet activity did not occur: Safety/medical concerns (fatigue, pain, decreased balance/postural control)  Assist level: Contact Guard/Touching assist Assistive device: Walker-rolling   Walk 50 feet activity   Assist Walk 50 feet with 2 turns activity did not occur: Safety/medical concerns (fatigue, pain, decreased balance/postural control)         Walk 150 feet activity   Assist Walk 150 feet activity did not occur: Safety/medical concerns (fatigue, pain,  decreased balance/postural control)         Walk 10 feet on uneven surface  activity   Assist Walk 10 feet on uneven surfaces activity did not occur: Safety/medical concerns (fatigue, pain, decreased balance/postural control)         Wheelchair     Assist Is the patient using a wheelchair?: Yes Type of Wheelchair: Manual Wheelchair activity did not occur: Safety/medical concerns (fatigue, pain, decreased balance/postural control)  Wheelchair assist level: Supervision/Verbal cueing Max wheelchair distance: 40'    Wheelchair 50 feet with 2 turns activity    Assist    Wheelchair 50 feet with 2 turns activity did not occur: Safety/medical concerns (fatigue, pain, decreased balance/postural control)   Assist Level: Supervision/Verbal cueing   Wheelchair 150 feet activity     Assist  Wheelchair 150 feet activity did not occur: Safety/medical concerns (fatigue, pain, decreased balance/postural control)        Medical Problem List and Plan: 1.   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  Antithrombotics: -DVT/anticoagulation: Venous Doppler studies negative Mechanical:  Antiembolism stockings, knee (TED hose) Bilateral lower extremities Sequential compression devices, below knee Bilateral lower extremities             -antiplatelet therapy: N/A 3. Lower extremity pain:  Robaxin scheduled on 8/14             -kpad for muscular pain in legs  Continue OxyContin started on 8/13  Continue gabapentin 300mg  TID  Added voltaren gel to right thigh Left ankle pain likely mild liagamentous strain, may ice after therapy and wrap prior to therapy    4. Anxiety: continue Atarax 25 mg twice daily as needed anxiety as well as 50 mg nightly as needed insomnia             -antipsychotic agents: N/A 5. Neuropsych: This patient is capable of making decisions on her  own behalf. 6. LUE  forearm and RUE dorsum of hand IV infiltrations:  Placed nursing order for warm compress 15 minutes TID. Educated patient regarding benefits of this for pain control. PICC to be placed toady 7. Fluids/Electrolytes/Nutrition: Routine in and outs 8.  ID/Bacteremia/MRSA endocarditis.  Follow-up infectious disease..  Continue vancomycin through 12/19/2020 9.    Acute pulmonary emboli felt to be secondary to septic pulmonary emboli .initially on heparin which was discontinued as patient developed anemia.  Lower extremity Dopplers negative.  Pulmonary was involved no recommendations for ongoing anticoagulation at this time 10.  Anemia/leukocytosis: improving, monitor weekly 11.  Hyponatremia.   Sodium 132 on 8/15  Continue to monitor 12.  Poorly controlled diabetes mellitus.  Hemoglobin A1c 12.3.   NovoLog 8 units 3 times daily, d/ced on 8/14 Semglee to 30 units daily on 8/13.  Diabetic teaching             -adjust regimen as needed  Labile on 8/18, monitor for trend, discontinue Ensure CBG (last 3)  Recent Labs    11/21/20 0615 11/21/20 0926 11/21/20 1200  GLUCAP 89 137* 86  Controlled 8/22  13.  Constipation.  Continue Miralax BID 14. Dry nose: continue scheduled saline nasal spray BID  LOS: 10 days A FACE TO FACE EVALUATION WAS PERFORMED  Kattleya Kuhnert P Latrena Benegas 11/21/2020, 1:05 PM

## 2020-11-22 LAB — GLUCOSE, CAPILLARY
Glucose-Capillary: 85 mg/dL (ref 70–99)
Glucose-Capillary: 80 mg/dL (ref 70–99)
Glucose-Capillary: 70 mg/dL (ref 70–99)
Glucose-Capillary: 105 mg/dL — ABNORMAL HIGH (ref 70–99)

## 2020-11-22 MED ORDER — INSULIN GLARGINE-YFGN 100 UNIT/ML ~~LOC~~ SOLN
29.0000 [IU] | Freq: Every day | SUBCUTANEOUS | Status: DC
  Administered 2020-11-23 – 2020-11-25 (×3): 29 [IU] via SUBCUTANEOUS
  Filled 2020-11-22 (×4): qty 0.29

## 2020-11-22 NOTE — Progress Notes (Signed)
Occupational Therapy Session Note  Patient Details  Name: Chelsea Mathis MRN: [id number] Date of Birth: [birth year]  Today's Date: 11/22/2020 OT Individual Time: 1300-1330 OT Individual Time Calculation (min): 30 min    Short Term Goals: Week 2:  OT Short Term Goal 1 (Week 2): Pt will complete 1/3 components of donning pants using AE as needed OT Short Term Goal 2 (Week 2): Pt will complete 1 grooming task while standing at the sink OT Short Term Goal 3 (Week 2): Pt will complete bathing at sit<stand level with CGA using AE as needed  Skilled Therapeutic Interventions/Progress Updates:  Pt greeted supine in bed agreeable to OT intervention. Pt completed bed mobility with supervision with heavy use of bed features. Pt completed stand pivot transfer from EOB >w/c with Rw and CGA. Pt transported to ADL apartment with total A for time mgmt. Pt completed functional mobility to flat bed/ couch with pt needing MIN A for sit>supine with pt needing assist to elevate BLEs, education provided on using gait belt as leg lifter as needed. Pt was able to transition supine>sitting with supervision. Pt completed functional mobility to low couch with Rw and CGA. Demo'ed use of TTB as pt has tub at home, pt would benefit from practicing TTB next session. Pt returned to room in w/c with total A, pt left up in w/c with all needs within reach.   Therapy Documentation Precautions:  Precautions Precautions: Fall Precaution Comments: high levels of pain Restrictions Weight Bearing Restrictions: No    Pain: Pt reports mild pain in BLEs, utilized rest breaks, heat  and repositioning as pain mgmt strategies.    Therapy/Group: Individual Therapy  Barron Schmid 11/22/2020, 3:37 PM

## 2020-11-22 NOTE — Progress Notes (Signed)
Occupational Therapy Session Note  Patient Details  Name: Chelsea Mathis MRN: [id number] Date of Birth: 1990-09-07  Today's Date: 11/22/2020 OT Individual Time: 2947-6546 OT Individual Time Calculation (min): 70 min    Short Term Goals: Week 1:  OT Short Term Goal 1 (Week 1): Pt will complete LB self care at sit<stand level during 2 consecutive OT sessions as evidence of improved pain mgt OT Short Term Goal 1 - Progress (Week 1): Met OT Short Term Goal 2 (Week 1): Pt will complete 1 grooming task while standing at the sink to increase standing endurance OT Short Term Goal 2 - Progress (Week 1): Progressing toward goal OT Short Term Goal 3 (Week 1): Pt will complete 1/3 components of donning pants using AE as needed OT Short Term Goal 3 - Progress (Week 1): Progressing toward goal  Skilled Therapeutic Interventions/Progress Updates:    Pt greeted semi-reclined in bed reporting spasms at R thigh, using heat pack and just received meds. Pt agreeable to BADL tasks. Pt completed bed mobility with min A to advance R LE to EOB. Extended rest breaks after all movement 2/2 pain and spasms. Pt completed stand-pivot to wc with RW and min A. Bathing/dressing from wc sit<>stand with pt able to wash peri-area, but needed OT assist to wash posterior peri-area 2/2 fatigue. OT issued LH sponge and pt able to wash lower legs. Max A to thread smaller underwear, but mod A for larger pants. Educated on sock-aid with pt able to demonstrate understanding with min cues. Worked on standing balance/endurance with standing grooming tasks for 4 minutes. Pt took rest break, then ambulated to the door in room w/ RW and min A, but multiple standing rest breaks 2/2 pain. Pt returned to bed and left semi-reclined in bed with needs met.   Therapy Documentation Precautions:  Precautions Precautions: Fall Precaution Comments: high levels of pain Restrictions Weight Bearing Restrictions: No General:   Vital Signs: Therapy  Vitals Temp: 97.6 F (36.4 C) Temp Source: Oral Pulse Rate: 94 Resp: 18 BP: 122/76 Patient Position (if appropriate): Lying Oxygen Therapy SpO2: 99 % O2 Device: Room Air Pain: Pain Assessment Pain Scale: 0-10 Pain Score: 7  Pain Type: Acute pain Pain Location: Leg Pain Orientation: Right Pain Descriptors / Indicators: Cramping Pain Frequency: Intermittent Pain Intervention(s): Medication (See eMAR) ADL: ADL Grooming: Setup Where Assessed-Grooming: Edge of bed Upper Body Bathing: Setup Where Assessed-Upper Body Bathing: Edge of bed Lower Body Bathing: Moderate assistance Where Assessed-Lower Body Bathing: Edge of bed, Bed level Upper Body Dressing: Setup Where Assessed-Upper Body Dressing: Edge of bed Lower Body Dressing: Dependent Where Assessed-Lower Body Dressing: Edge of bed, Bed level Toileting: Not assessed Toilet Transfer: Moderate assistance (per PT eval) Toilet Transfer Method: Stand pivot (RW) Toilet Transfer Equipment: Engineer, technical sales Transfer: Not assessed Vision   Perception    Praxis   Exercises:   Other Treatments:     Therapy/Group: Individual Therapy  Valma Cava 11/22/2020, 8:44 AM

## 2020-11-22 NOTE — Progress Notes (Signed)
Patient ID: Chelsea Mathis, female   DOB: 05-Mar-1991, 30 y.o.   MRN: [id number]  Met with pt and Chelsea Mathis to update team conference progress toward goals of supervision-mod/I level and discharge still 8/30. Chelsea Mathis here to attend therapies, pt deciding upon home with Chelsea Mathis or to Chelsea Mathis's home. She feels at this time she will go home and try this and if not getting the care she needs will go to Chelsea Mathis's home. Aware will need to set up IV antibiotics teaching and will try to find follow up therapies, difficult with medicaid coverage. Continue to work discharge needs.

## 2020-11-22 NOTE — Progress Notes (Signed)
Physical Therapy Session Note  Patient Details  Name: Chelsea Mathis MRN: [id number] Date of Birth: 1991-02-02  Today's Date: 11/22/2020 PT Individual Time: 1022-1100; 0370-4888 PT Individual Time Calculation (min): 38 min and 53 min  Short Term Goals: Week 1:  PT Short Term Goal 1 (Week 1): pt will perform bed mobility with min A consistantly PT Short Term Goal 1 - Progress (Week 1): Progressing toward goal PT Short Term Goal 2 (Week 1): pt will transfer sit<>stand with LRAD and min A PT Short Term Goal 2 - Progress (Week 1): Met PT Short Term Goal 3 (Week 1): pt will ambulate 37f with LRAD and mod A PT Short Term Goal 3 - Progress (Week 1): Met Week 2:  PT Short Term Goal 1 (Week 2): LTG = STG due to LOS  Skilled Therapeutic Interventions/Progress Updates:  Session 1 Pt received supine in bed, reported pain as 4/10 in R hip but described it as more of a muscle spasm. Pt verbalized concern regarding caretaker at home and receiving IV antibiotic upon DC, pt's significant other no longer a caretaker option. Informed her that therapist would notify the team during conference. Emphasis of session on transfers and community reintegration. Pt performed bed mobility w/supervision and sit <>stand from low bed w/RW and supervision. Pt ambulated 15' w/RW and supervision. Pt transported to ortho gym w/total A for time management. Sit <>stand pivot w/supervision and pt completed car transfer w/CGA and RW. Educated pt on reclining car seat to decrease hip flexion needed to get Les into/out of car, pt verbalized understanding and was able to demonstrate. Sit <>stand [facility] and RW, pt self-propelled from gym to room (>200') mod I. Sit <>stand pivot from Wc to EOB w/supervision. Pt performed bed mobility w/supervision and was left supine in bed with all needs in reach, pain in R hip as 5/10 and provided heat pack for relief.   Session 2 Pt received sitting in WC, denied pain in R  hip but verbalized fatigue. Emphasis of session on stair training and improved endurance. Pt self-propelled from room to main gym mod I. In // bars, sit <>stand and alt. Toe taps to 2" step w/BUE support on rails and supervision, x4 per side to prime for stair training. Pt demonstrated improved step clearance and upright posture, denied pain in hip.   Stair training  -Pt ascended/descended 8 3" steps w/BUE support and step-to pattern w/CGA. Educated pt on ascending w/LLE and descending w/RLE, pt able to teach back. Pt demonstrated adequate step clearance and weight bearing on RLE.  -Progressed to ascending/descending 4 6" steps w/BUE support and step-to pattern w/CGA. Pt had minor LOB on last step requiring mod A for correction, which pt reported was 2/2 fatigue.  -Educated pt on ascending/descending curb w/RW using forward approach. Pt practiced x2 w/CGA.   Pt ambulated 150' w/RW and one standing rest break w/supervision. Noted antalgic gait pattern, decreased step length of L >R, decreased step clearance and trunk lean to L, likely 2/2 fatigue. Pt transported back to room w/total A and performed sit <>stand pivot w/RW to EOB w/supervision. Pt performed bed mobility w/min A 2/2 fatigue/pain and was left supine in bed w/nursing receiving pain meds, all needs in reach.   Therapy Documentation Precautions:  Precautions Precautions: Fall Precaution Comments: high levels of pain Restrictions Weight Bearing Restrictions: No   Therapy/Group: Individual Therapy  Chelsea Mathis E Chelsea Mathis 11/22/2020, 7:53 AM

## 2020-11-22 NOTE — Patient Care Conference (Signed)
Inpatient RehabilitationTeam Conference and Plan of Care Update Date: 11/22/2020   Time: 12:06 PM    Patient Name: Chelsea Mathis      Medical Record Number: [id number]  Date of Birth: 03/28/1991 Sex: Female         Room/Bed: 4W22C/4W22C-01 Payor Info: Payor: [place] MEDICAID PREPAID HEALTH PLAN / Plan: [place] MEDICAID [facility] / Product Type: *No Product type* /    Admit Date/Time:  11/11/2020  3:17 PM  Primary Diagnosis:  Myositis  Hospital Problems: Principal Problem:   Myositis Active Problems:   Acute blood loss anemia   Leukocytosis   Hyponatremia   Acute bacterial endocarditis   Pain of right thigh   Labile blood glucose    Expected Discharge Date: Expected Discharge Date: 11/29/20  Team Members Present: Physician leading conference: Dr. Sula Soda Social Worker Present: Dossie Der, LCSW Nurse Present: Chana Bode, RN PT Present: Ernestene Kiel, PT OT Present: Ezra Sites, OT SLP Present: Feliberto Gottron, SLP PPS Coordinator present : Edson Snowball, PT     Current Status/Progress Goal Weekly Team Focus  Bowel/Bladder   Pt cont of B/B. last BM-8/22  Regular BMs every 3 days or less  assess q shift and PRN   Swallow/Nutrition/ Hydration             ADL's   tolerates standing activities, SPT w/ RW and short distance ambulation w/ spvsn/CGA; min A bathing, max A LB ADLs, mod A toileting, set up UB ADLs  spvsn for sit<>stand level ADLs/functional mobility; mod I UB ADLs  LB ADLs, pain management, DME/AE, energy conservation, endurance, d/c planning and fall prevention   Mobility   Supervision-min A bed mobility, transfers and gait  Supervision  Stretching, positioning, pain management, gait training, endurance   Communication             Safety/Cognition/ Behavioral Observations            Pain   Pain 8-10 of 10 to R side( leg, hip). managed by pain medications.  Pain level <3/10 especially with movement  assess pain q shift and PRN   Skin   skin intact  no  new breakdown  assess skin q shift and PRN     Discharge Planning:  Making progress in therapies and pain is somewhat better. [name] here daily and provides support   Team Discussion: Patient with right thigh pain; MD added voltaren gel and muscle relaxers. Patient making progress and looking at options for discharge.  Patient on target to meet rehab goals: Currently requires supervision - min assist for transfers and gait. Completes stand pivot transfers, and short distance ambulation with supervision. Needs min assist for bathing and max assist for lower body ADLs.   *See Care Plan and progress notes for long and short-term goals.   Revisions to Treatment Plan:  Working on ROM of right lower extremity Stretching for pain management Energy conservation tips   Teaching Needs: Transfers, use of DME, medications, insulin administration, etc  Current Barriers to Discharge: Decreased caregiver support, Home enviroment access/layout, and New diabetic and IV ABX  Possible Resolutions to Barriers: Home with mother in [place] Hired help; list of private duty agencies     Medical Summary Current Status: right thigh pain, receiving IV antibiotics, PICC placed last week  Barriers to Discharge: IV antibiotics;Decreased family/caregiver support;Behavior  Barriers to Discharge Comments: right thigh pain, receiving IV antibiotics, PICC placed last week Possible Resolutions to [employer] Focus: conitnue scheduled oxycontin, prn percocet, voltaren gel, conitnue IV  antibiotics and PICC with education of patient and mother   Continued Need for Acute Rehabilitation Level of Care: The patient requires daily medical management by a physician with specialized training in physical medicine and rehabilitation for the following reasons: Direction of a multidisciplinary physical rehabilitation program to maximize functional independence : Yes Medical management of patient stability for increased  activity during participation in an intensive rehabilitation regime.: Yes Analysis of laboratory values and/or radiology reports with any subsequent need for medication adjustment and/or medical intervention. : Yes   I attest that I was present, lead the team conference, and concur with the assessment and plan of the team.   Chana Bode B 11/22/2020, 2:26 PM

## 2020-11-22 NOTE — Progress Notes (Signed)
[place] PHYSICAL MEDICINE & REHABILITATION PROGRESS NOTE  Subjective/Complaints: Right thigh pain much improved today  She is not sure if the voltaren gel is helping, but is applying it in case it is. Mother present today  ROS: Denies CP, SOB, N/V/D, +right thigh pain  Objective: Vital Signs: Blood pressure 128/77, pulse 96, temperature 98.2 F (36.8 C), temperature source Oral, resp. rate 16, height 5\' 4"  (1.626 m), weight 82.1 kg, last menstrual period 10/31/2020, SpO2 98 %, unknown if currently breastfeeding. No results found. No results for input(s): WBC, HGB, HCT, PLT in the last 72 hours.  No results for input(s): NA, K, CL, CO2, GLUCOSE, BUN, CREATININE, CALCIUM in the last 72 hours.   Intake/Output Summary (Last 24 hours) at 11/22/2020 1924 Last data filed at 11/22/2020 1844 Gross per 24 hour  Intake 590 ml  Output 1125 ml  Net -535 ml        Physical Exam: BP 128/77 (BP Location: Left Leg)   Pulse 96   Temp 98.2 F (36.8 C) (Oral)   Resp 16   Ht 5\' 4"  (1.626 m)   Wt 82.1 kg   LMP 10/31/2020   SpO2 98%   BMI 31.07 kg/m  Gen: no distress, normal appearing HEENT: oral mucosa pink and moist, NCAT Cardio: Reg rate Chest: normal effort, normal rate of breathing Abd: soft, non-distended Ext: no edema Psych: pleasant, normal affect Skin: intact  Musc: Right lower extremity edema.  No tenderness in extremities.tenderness along RIght lateral thigh Left ankle without swelling or ecchymosis, mild TTP over deltoid ligament  Neuro: Alert Motor: Left lower extremity: 4 -/5 proximal to distal (pain inhibition), stable Right lower extremity: Hip flexion, knee extension 2+/5, ankle dorsiflexion 4 -/5 (pain inhibition), stable  Assessment/Plan: 1. Functional deficits which require 3+ hours per day of interdisciplinary therapy in a comprehensive inpatient rehab setting. Physiatrist is providing close team supervision and 24 hour management of active medical problems  listed below. Physiatrist and rehab team continue to assess barriers to discharge/monitor patient progress toward functional and medical goals   Care Tool:  Bathing    Body parts bathed by patient: Right arm, Left arm, Chest, Abdomen, Front perineal area, Right upper leg, Left upper leg, Right lower leg, Left lower leg, Face   Body parts bathed by helper: Buttocks     Bathing assist Assist Level: Minimal Assistance - Patient > 75%     Upper Body Dressing/Undressing Upper body dressing   What is the patient wearing?: Pull over shirt    Upper body assist Assist Level: Set up assist    Lower Body Dressing/Undressing Lower body dressing    Lower body dressing activity did not occur: Safety/medical concerns What is the patient wearing?: Underwear/pull up, Pants     Lower body assist Assist for lower body dressing: Maximal Assistance - Patient 25 - 49%     Toileting Toileting Toileting Activity did not occur (Clothing management and hygiene only): N/A (no void or bm)  Toileting assist Assist for toileting: Moderate Assistance - Patient 50 - 74%     Transfers Chair/bed transfer  Transfers assist     Chair/bed transfer assist level: Supervision/Verbal cueing     Locomotion Ambulation   Ambulation assist   Ambulation activity did not occur: Safety/medical concerns (fatigue, pain, decreased balance/postural control)  Assist level: Supervision/Verbal cueing Assistive device: Walker-rolling Max distance: 73'   Walk 10 feet activity   Assist  Walk 10 feet activity did not occur: Safety/medical concerns (fatigue, pain,  decreased balance/postural control)  Assist level: Supervision/Verbal cueing Assistive device: Walker-rolling   Walk 50 feet activity   Assist Walk 50 feet with 2 turns activity did not occur: Safety/medical concerns (fatigue, pain, decreased balance/postural control)  Assist level: Supervision/Verbal cueing Assistive device: Walker-rolling     Walk 150 feet activity   Assist Walk 150 feet activity did not occur: Safety/medical concerns (fatigue, pain, decreased balance/postural control)         Walk 10 feet on uneven surface  activity   Assist Walk 10 feet on uneven surfaces activity did not occur: Safety/medical concerns (fatigue, pain, decreased balance/postural control)         Wheelchair     Assist Is the patient using a wheelchair?: Yes Type of Wheelchair: Manual Wheelchair activity did not occur: Safety/medical concerns (fatigue, pain, decreased balance/postural control)  Wheelchair assist level: Supervision/Verbal cueing Max wheelchair distance: 26'    Wheelchair 50 feet with 2 turns activity    Assist    Wheelchair 50 feet with 2 turns activity did not occur: Safety/medical concerns (fatigue, pain, decreased balance/postural control)   Assist Level: Supervision/Verbal cueing   Wheelchair 150 feet activity     Assist  Wheelchair 150 feet activity did not occur: Safety/medical concerns (fatigue, pain, decreased balance/postural control)        Medical Problem List and Plan: 1.   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  Antithrombotics: -DVT/anticoagulation: Venous Doppler studies negative Mechanical:  Antiembolism stockings, knee (TED hose) Bilateral lower extremities Sequential compression devices, below knee Bilateral lower extremities             -antiplatelet therapy: N/A 3. Lower extremity pain:  Robaxin scheduled on 8/14             -kpad for muscular pain in legs  Continue OxyContin started on 8/13  Continue gabapentin 300mg  TID  Continue voltaren gel to right thigh Left ankle pain likely mild liagamentous strain, may ice after therapy and wrap prior to therapy    4. Anxiety: continue Atarax 25 mg twice daily as needed anxiety as well as 50 mg nightly as needed insomnia              -antipsychotic agents: N/A 5. Neuropsych: This patient is capable of making decisions on her  own behalf. 6. LUE forearm and RUE dorsum of hand IV infiltrations:  Placed nursing order for warm compress 15 minutes TID. Educated patient regarding benefits of this for pain control. PICC placed 7. Fluids/Electrolytes/Nutrition: Routine in and outs 8.  ID/Bacteremia/MRSA endocarditis.  Follow-up infectious disease..  Continue vancomycin through 12/19/2020 9.    Acute pulmonary emboli felt to be secondary to septic pulmonary emboli .initially on heparin which was discontinued as patient developed anemia.  Lower extremity Dopplers negative.  Pulmonary was involved no recommendations for ongoing anticoagulation at this time 10.  Anemia/leukocytosis: improving, monitor weekly 11.  Hyponatremia.   Sodium 132 on 8/15, may repeat outpatient  Continue to monitor 12.  Poorly controlled diabetes mellitus.  Hemoglobin A1c 12.3.   NovoLog 8 units 3 times daily, d/ced on 8/14 Semglee to 30 units daily on 8/13.  Diabetic teaching             -adjust regimen as needed  Labile on 8/18, monitor for trend, discontinue Ensure CBG (last 3)  Recent Labs    11/22/20 0550 11/22/20 1129 11/22/20 1619  GLUCAP 85 80 70  Controlled 8/23- decrease Semglee to 29U.   13.  Constipation. Continue Miralax BID 14. Dry nose: continue scheduled saline nasal spray BID  LOS: 11 days A FACE TO FACE EVALUATION WAS PERFORMED  Drema Pry September Mormile 11/22/2020, 7:24 PM

## 2020-11-23 LAB — GLUCOSE, CAPILLARY
Glucose-Capillary: 107 mg/dL — ABNORMAL HIGH (ref 70–99)
Glucose-Capillary: 65 mg/dL — ABNORMAL LOW (ref 70–99)
Glucose-Capillary: 118 mg/dL — ABNORMAL HIGH (ref 70–99)
Glucose-Capillary: 133 mg/dL — ABNORMAL HIGH (ref 70–99)

## 2020-11-23 LAB — VANCOMYCIN, TROUGH: Vancomycin Tr: 10 ug/mL — ABNORMAL LOW (ref 15–20)

## 2020-11-23 NOTE — Progress Notes (Signed)
Physical Therapy Session Note  Patient Details  Name: Chelsea Mathis MRN: [id number] Date of Birth: [birth year]  Today's Date: 11/23/2020 PT Individual Time: 1520-1545 PT Individual Time Calculation (min): 25 min   Short Term Goals: Week 2:  PT Short Term Goal 1 (Week 2): LTG = STG due to LOS  Skilled Therapeutic Interventions/Progress Updates:    Patient in w/c in room.  Reports tired from steps.  Patient sit to stand S to RW and ambulated with S x 100' noted wearing belt and hip support on R thigh.  Patient seated to remove belt.  To supine with min A for R LE.  In supine performed heel slides x 10, SAQ x 10 w/ 5 sec hold, hip adductor squeezes x 10 5 sec hold, hip abduction in hooklying with green band around knees x 10, hip flexion in hooklying marching x 10.  Patient left supine with call bell and needs in reach and bed alarm active with ice to R groin.   Therapy Documentation Precautions:  Precautions Precautions: Fall Precaution Comments: high levels of pain Restrictions Weight Bearing Restrictions: No  Pain: Pain Assessment Pain Scale: 0-10 Pain Score: 8  Pain Location: Leg Pain Orientation: Right Pain Descriptors / Indicators: Guarding;Aching Pain Onset: With Activity Pain Intervention(s): Repositioned;Cold applied    Therapy/Group: Individual Therapy  Elray Mcgregor [place], PT 11/23/2020, 5:14 PM

## 2020-11-23 NOTE — Progress Notes (Signed)
[place] PHYSICAL MEDICINE & REHABILITATION PROGRESS NOTE  Subjective/Complaints: Pain is better controlled Plan is for her to go home, and go to her mother's home only if she needs additional assistance She has no other complaints today.   ROS: Denies CP, SOB, N/V/D, +right thigh pain  Objective: Vital Signs: Blood pressure 130/77, pulse 94, temperature 98.4 F (36.9 C), temperature source Oral, resp. rate 16, height 5\' 4"  (1.626 m), weight 82 kg, last menstrual period 10/31/2020, SpO2 98 %, unknown if currently breastfeeding. No results found. No results for input(s): WBC, HGB, HCT, PLT in the last 72 hours.  No results for input(s): NA, K, CL, CO2, GLUCOSE, BUN, CREATININE, CALCIUM in the last 72 hours.   Intake/Output Summary (Last 24 hours) at 11/23/2020 1228 Last data filed at 11/22/2020 1844 Gross per 24 hour  Intake 590 ml  Output --  Net 590 ml        Physical Exam: BP 130/77 (BP Location: Left Leg)   Pulse 94   Temp 98.4 F (36.9 C) (Oral)   Resp 16   Ht 5\' 4"  (1.626 m)   Wt 82 kg   LMP 10/31/2020   SpO2 98%   BMI 31.03 kg/m  Gen: no distress, normal appearing HEENT: oral mucosa pink and moist, NCAT Cardio: Reg rate Chest: normal effort, normal rate of breathing Abd: soft, non-distended Ext: no edema Psych: pleasant, normal affect Skin: intact  Musc: Right lower extremity edema.  No tenderness in extremities.tenderness along RIght lateral thigh Left ankle without swelling or ecchymosis, mild TTP over deltoid ligament  Neuro: Alert Motor: Left lower extremity: 4 -/5 proximal to distal (pain inhibition), stable Right lower extremity: Hip flexion, knee extension 2+/5, ankle dorsiflexion 4 -/5 (pain inhibition), stable  Assessment/Plan: 1. Functional deficits which require 3+ hours per day of interdisciplinary therapy in a comprehensive inpatient rehab setting. Physiatrist is providing close team supervision and 24 hour management of active medical  problems listed below. Physiatrist and rehab team continue to assess barriers to discharge/monitor patient progress toward functional and medical goals   Care Tool:  Bathing    Body parts bathed by patient: Right arm, Left arm, Chest, Abdomen, Front perineal area, Right upper leg, Left upper leg, Right lower leg, Left lower leg, Face   Body parts bathed by helper: Buttocks     Bathing assist Assist Level: Minimal Assistance - Patient > 75%     Upper Body Dressing/Undressing Upper body dressing   What is the patient wearing?: Pull over shirt    Upper body assist Assist Level: Set up assist    Lower Body Dressing/Undressing Lower body dressing    Lower body dressing activity did not occur: Safety/medical concerns What is the patient wearing?: Underwear/pull up, Pants     Lower body assist Assist for lower body dressing: Maximal Assistance - Patient 25 - 49%     Toileting Toileting Toileting Activity did not occur (Clothing management and hygiene only): N/A (no void or bm)  Toileting assist Assist for toileting: Moderate Assistance - Patient 50 - 74%     Transfers Chair/bed transfer  Transfers assist     Chair/bed transfer assist level: Supervision/Verbal cueing     Locomotion Ambulation   Ambulation assist   Ambulation activity did not occur: Safety/medical concerns (fatigue, pain, decreased balance/postural control)  Assist level: Supervision/Verbal cueing Assistive device: Walker-rolling Max distance: 19'   Walk 10 feet activity   Assist  Walk 10 feet activity did not occur: Safety/medical concerns (fatigue,  pain, decreased balance/postural control)  Assist level: Supervision/Verbal cueing Assistive device: Walker-rolling   Walk 50 feet activity   Assist Walk 50 feet with 2 turns activity did not occur: Safety/medical concerns (fatigue, pain, decreased balance/postural control)  Assist level: Supervision/Verbal cueing Assistive device:  Walker-rolling    Walk 150 feet activity   Assist Walk 150 feet activity did not occur: Safety/medical concerns (fatigue, pain, decreased balance/postural control)         Walk 10 feet on uneven surface  activity   Assist Walk 10 feet on uneven surfaces activity did not occur: Safety/medical concerns (fatigue, pain, decreased balance/postural control)         Wheelchair     Assist Is the patient using a wheelchair?: Yes Type of Wheelchair: Manual Wheelchair activity did not occur: Safety/medical concerns (fatigue, pain, decreased balance/postural control)  Wheelchair assist level: Supervision/Verbal cueing Max wheelchair distance: 43'    Wheelchair 50 feet with 2 turns activity    Assist    Wheelchair 50 feet with 2 turns activity did not occur: Safety/medical concerns (fatigue, pain, decreased balance/postural control)   Assist Level: Supervision/Verbal cueing   Wheelchair 150 feet activity     Assist  Wheelchair 150 feet activity did not occur: Safety/medical concerns (fatigue, pain, decreased balance/postural control)        Medical Problem List and Plan: 1.   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  Antithrombotics: -DVT/anticoagulation: Venous Doppler studies negative Mechanical:  Antiembolism stockings, knee (TED hose) Bilateral lower extremities Sequential compression devices, below knee Bilateral lower extremities             -antiplatelet therapy: N/A 3. Lower extremity pain:  Robaxin scheduled on 8/14             -kpad for muscular pain in legs  Continue OxyContin started on 8/13  Continue gabapentin 300mg  TID  Continue voltaren gel to right thigh Left ankle pain likely mild liagamentous strain, may ice after therapy and wrap prior to therapy    4. Anxiety: continue Atarax 25 mg twice daily as needed anxiety as well as 50 mg nightly as needed  insomnia             -antipsychotic agents: N/A 5. Neuropsych: This patient is capable of making decisions on her  own behalf. 6. LUE forearm and RUE dorsum of hand IV infiltrations:  Placed nursing order for warm compress 15 minutes TID. Educated patient regarding benefits of this for pain control. PICC placed 7. Fluids/Electrolytes/Nutrition: Routine in and outs 8.  ID/Bacteremia/MRSA endocarditis.  Follow-up infectious disease..  Continue vancomycin through 12/19/2020 9.    Acute pulmonary emboli felt to be secondary to septic pulmonary emboli .initially on heparin which was discontinued as patient developed anemia.  Lower extremity Dopplers negative.  Pulmonary was involved no recommendations for ongoing anticoagulation at this time 10.  Anemia/leukocytosis: improving, monitor weekly 11.  Hyponatremia.   Sodium 132 on 8/15, may repeat outpatient  Continue to monitor 12.  Poorly controlled diabetes mellitus.  Hemoglobin A1c 12.3.   NovoLog 8 units 3 times daily, d/ced on 8/14 Semglee to 30 units daily on 8/13.  Diabetic teaching             -adjust regimen as needed  Labile on 8/18, monitor for trend, discontinue Ensure CBG (last 3)  Recent Labs    11/22/20 1619 11/22/20 2101 11/23/20 0633  GLUCAP 70 105* 107*  Controlled 8/24- continue Semglee 29U. D/c SSI.  13.  Constipation. Continue Miralax BID 14. Dry nose: continue scheduled saline nasal spray BID  LOS: 12 days A FACE TO FACE EVALUATION WAS PERFORMED  Chelsea Mathis 11/23/2020, 12:28 PM

## 2020-11-23 NOTE — Plan of Care (Signed)
  Problem: Consults Goal: RH GENERAL PATIENT EDUCATION Description: See Patient Education module for education specifics. Outcome: Progressing Goal: Diabetes Guidelines if Diabetic/Glucose > 140 Description: If diabetic or lab glucose is > 140 mg/dl - Initiate Diabetes/Hyperglycemia Guidelines & Document Interventions  Outcome: Progressing   Problem: RH SAFETY Goal: RH STG DECREASED RISK OF FALL WITH ASSISTANCE Description: STG Decreased Risk of Fall With cues and reminders. Outcome: Progressing   Problem: RH PAIN MANAGEMENT Goal: RH STG PAIN MANAGED AT OR BELOW PT'S PAIN GOAL Description: < 3 on a 0-10 pain scale. Outcome: Progressing   Problem: RH KNOWLEDGE DEFICIT GENERAL Goal: RH STG INCREASE KNOWLEDGE OF SELF CARE AFTER HOSPITALIZATION Description: Patient will demonstrate knowledge of medication management, pain management, safety awareness with educational materials and handouts provided by staff independently at discharge. Outcome: Progressing   

## 2020-11-23 NOTE — Progress Notes (Signed)
Occupational Therapy Session Note  Patient Details  Name: Chelsea Mathis MRN: [id number] Date of Birth: 10-27-1990  Today's Date: 11/23/2020 OT Individual Time: 1303-1403 OT Individual Time Calculation (min): 60 min    Short Term Goals: Week 2:  OT Short Term Goal 1 (Week 2): Pt will complete 1/3 components of donning pants using AE as needed OT Short Term Goal 2 (Week 2): Pt will complete 1 grooming task while standing at the sink OT Short Term Goal 3 (Week 2): Pt will complete bathing at sit<stand level with CGA using AE as needed  Skilled Therapeutic Interventions/Progress Updates:  Pt greeted supine in bed agreeable to OT intervention. Pt completed bed mobility with supervision. Supervision for stand pivot transfer with RW from EOB >w/c. Pt completed wash up at sink with set- up assist overall, gross supervision for sit<>stand to wash posterior peri area. Pt completed UB dressing with MOD A ( pt wearing gown to accommodate for IV running). Supervision for LB dressing via sit<>stand. Pt completed oral care at sink MOD I. Pt transported to tub room to practice tub transfer to TTB, pt completed transfer with CGA for safety, cues to sequence body mechanics and safe set- up. Pt transported back to room with total A where pt left up in w/c with alarm belt activated and all needs within reach.   Therapy Documentation Precautions:  Precautions Precautions: Fall Precaution Comments: high levels of pain Restrictions Weight Bearing Restrictions: No     Pain: Pt reports no pain during session.    Therapy/Group: Individual Therapy  Barron Schmid 11/23/2020, 3:57 PM

## 2020-11-23 NOTE — Progress Notes (Signed)
Pharmacy Antibiotic Note  Chelsea Mathis is a 30 y.o. 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  Pharmacy was consulted for vancomycin dosing.  Vancomycin trough 10 this AM, same as last week  Plan: Continue Vancomycin 1750 mg IV Q 12 hrs (end date 12/19/20 ) Monitor weekly vancomycin levels   Height: 5\' 4"  (162.6 cm) Weight: 82 kg (180 lb 12.4 oz) IBW/kg (Calculated) : 54.7  Temp (24hrs), Avg:98.2 F (36.8 C), Min:97.9 F (36.6 C), Max:98.4 F (36.9 C)  Recent Labs  Lab 11/16/20 1236 11/16/20 1924 11/23/20 0826  VANCOTROUGH  --  10* 10*  VANCOPEAK 26*  --   --      Estimated Creatinine Clearance: 104.6 mL/min (by C-G formula based on SCr of 0.46 mg/dL).    No Known Allergies  Antimicrobials this admission: Vancomycin  8/1 >> (12/19/20) Zosyn 8/1 >> 8/2 Clindamycin  x 1 8/1 Cefepime 8/6 >> 8/9 Mupirocin nasal 8/3 >> 8/9  Microbiology results: 8/1 BCx: 2/2 MRSA 8/1 urine: multiple species. Recollection suggested 8/1 COVID, flu A/B, HIV screen: negative 8/1 MRSA PCR: positive 8/3 HCV Aby negative 8/2 BCx x 2: NG/final 8/5 BCx X 2: 1/4 btls with MRSA 8/6 BCx  X 2: 1/4 btls with MRSA 8/8 Bcx X 2: NG/final  Thank you for involving pharmacy in this patient's care. 10/1, PharmD 11/23/2020 9:57 AM  **Pharmacist phone directory can be found on [email or web address] listed under [facility]**

## 2020-11-23 NOTE — Progress Notes (Signed)
Physical Therapy Session Note  Patient Details  Name: Chelsea Mathis MRN: [id number] Date of Birth: October 11, 1990  Today's Date: 11/23/2020 PT Individual Time: 1400-1450, 1400-1450 PT Individual Time Calculation (min): 50 min, 50 min  Short Term Goals: Week 1:  PT Short Term Goal 1 (Week 1): pt will perform bed mobility with min A consistantly PT Short Term Goal 1 - Progress (Week 1): Progressing toward goal PT Short Term Goal 2 (Week 1): pt will transfer sit<>stand with LRAD and min A PT Short Term Goal 2 - Progress (Week 1): Met PT Short Term Goal 3 (Week 1): pt will ambulate 51f with LRAD and mod A PT Short Term Goal 3 - Progress (Week 1): Met Week 2:  PT Short Term Goal 1 (Week 2): LTG = STG due to LOS  Skilled Therapeutic Interventions/Progress Updates:    Session 1: Pt on BSC with assistance of fiance on arrival. Therapist assisted with applying cream after toileting and donning R hip/SI brace. Pt performed Stand pivot transfer with CGA and RW throughout session. Pt transported to therapy gym for time management and energy conservation. Pt performed squats to mat table 3 x 10 with RW and supervision. Pt reported mild incr in hip pain. Step taps on 4" step x 10. Attempted isometric SLS hold with foot on step for hip pain, pt reported no change BIL. Gait x 50 ft with CGA and RW. Pt limited by pain in hip. Pt returned to bed after session with stand pivot transfer CGA and was left with all needs in reach and alarm active.   Session 2: Pt seated in w/c on arrival and agreeable to therapy. Pt reports pain in R hip, donned brace, therapy to tolerance. Nursing present to adjust IV. Pt transported to therapy gym for time management and energy conservation. Therapist managed IV line throughout session.   Session focused on stair training including the following: Step taps on 6" step with BUE support 3 x 10 BIL Stair navigation with step to technique x4, x 8, x12 with BUE support.   Pt then  reported that she was fatigued and needed to return to her room. Pt remained in w/c and was left with all needs in reach and alarm active.   Therapy Documentation Precautions:  Precautions Precautions: Fall Precaution Comments: high levels of pain Restrictions Weight Bearing Restrictions: No General: PT Amount of Missed Time (min): 15 Minutes PT Missed Treatment Reason: Other (Comment) (previous pt care)   Therapy/Group: Individual Therapy  OMickel Fuchs8/24/2022, 4:17 PM

## 2020-11-24 LAB — GLUCOSE, CAPILLARY
Glucose-Capillary: 71 mg/dL (ref 70–99)
Glucose-Capillary: 99 mg/dL (ref 70–99)
Glucose-Capillary: 118 mg/dL — ABNORMAL HIGH (ref 70–99)
Glucose-Capillary: 101 mg/dL — ABNORMAL HIGH (ref 70–99)
Glucose-Capillary: 116 mg/dL — ABNORMAL HIGH (ref 70–99)

## 2020-11-24 NOTE — Progress Notes (Signed)
Patient continue to verbalize pain to upper/ mid thigh /leg, prn medications administered ,offering "little relief" states patient.. Pain score 7/10 continues. Very difficult to reposition due to increase pain,K-PAD in place, Voltaren cream applied per orders.Continue to monitor and assisted

## 2020-11-24 NOTE — Progress Notes (Signed)
[place] PHYSICAL MEDICINE & REHABILITATION PROGRESS NOTE  Subjective/Complaints: Pain is better controlled but seems to have slowed processing and some word-finding difficulties today: discussed that these could be effects of the increases muscle relaxers  ROS: Denies CP, SOB, N/V/D, +right thigh pain  Objective: Vital Signs: Blood pressure (!) 143/84, pulse 94, temperature 98.4 F (36.9 C), temperature source Oral, resp. rate 16, height 5\' 4"  (1.626 m), weight 82 kg, last menstrual period 10/31/2020, SpO2 97 %, unknown if currently breastfeeding. No results found. No results for input(s): WBC, HGB, HCT, PLT in the last 72 hours.  No results for input(s): NA, K, CL, CO2, GLUCOSE, BUN, CREATININE, CALCIUM in the last 72 hours.   Intake/Output Summary (Last 24 hours) at 11/24/2020 1334 Last data filed at 11/24/2020 1300 Gross per 24 hour  Intake 480 ml  Output --  Net 480 ml        Physical Exam: BP (!) 143/84 (BP Location: Left Leg)   Pulse 94   Temp 98.4 F (36.9 C) (Oral)   Resp 16   Ht 5\' 4"  (1.626 m)   Wt 82 kg   LMP 10/31/2020   SpO2 97%   BMI 31.03 kg/m  Gen: no distress, normal appearing HEENT: oral mucosa pink and moist, NCAT Cardio: Reg rate Chest: normal effort, normal rate of breathing Abd: soft, non-distended Ext: no edema Psych: pleasant, normal affect Skin: intact  Musc: Right lower extremity edema.  No tenderness in extremities.tenderness along RIght lateral thigh Left ankle without swelling or ecchymosis, mild TTP over deltoid ligament  Neuro: Alert, speech slowed Motor: Left lower extremity: 4 -/5 proximal to distal (pain inhibition), stable Right lower extremity: Hip flexion, knee extension 2+/5, ankle dorsiflexion 4 -/5 (pain inhibition), stable  Assessment/Plan: 1. Functional deficits which require 3+ hours per day of interdisciplinary therapy in a comprehensive inpatient rehab setting. Physiatrist is providing close team supervision and 24  hour management of active medical problems listed below. Physiatrist and rehab team continue to assess barriers to discharge/monitor patient progress toward functional and medical goals   Care Tool:  Bathing    Body parts bathed by patient: Right arm, Left arm, Chest, Abdomen, Front perineal area, Right upper leg, Left upper leg, Right lower leg, Left lower leg, Face, Buttocks   Body parts bathed by helper: Buttocks     Bathing assist Assist Level: Set up assist (from w/c level; sit<>stand for LB)     Upper Body Dressing/Undressing Upper body dressing   What is the patient wearing?: Hospital gown only    Upper body assist Assist Level: Moderate Assistance - Patient 50 - 74%    Lower Body Dressing/Undressing Lower body dressing    Lower body dressing activity did not occur: Safety/medical concerns What is the patient wearing?: Underwear/pull up, Pants     Lower body assist Assist for lower body dressing: Supervision/Verbal cueing     Toileting Toileting Toileting Activity did not occur (Clothing management and hygiene only): N/A (no void or bm)  Toileting assist Assist for toileting: Moderate Assistance - Patient 50 - 74%     Transfers Chair/bed transfer  Transfers assist     Chair/bed transfer assist level: Supervision/Verbal cueing (RW)     Locomotion Ambulation   Ambulation assist   Ambulation activity did not occur: Safety/medical concerns (fatigue, pain, decreased balance/postural control)  Assist level: Supervision/Verbal cueing Assistive device: Walker-rolling Max distance: 76'   Walk 10 feet activity   Assist  Walk 10 feet activity did not  occur: Safety/medical concerns (fatigue, pain, decreased balance/postural control)  Assist level: Supervision/Verbal cueing Assistive device: Walker-rolling   Walk 50 feet activity   Assist Walk 50 feet with 2 turns activity did not occur: Safety/medical concerns (fatigue, pain, decreased  balance/postural control)  Assist level: Supervision/Verbal cueing Assistive device: Walker-rolling    Walk 150 feet activity   Assist Walk 150 feet activity did not occur: Safety/medical concerns (fatigue, pain, decreased balance/postural control)         Walk 10 feet on uneven surface  activity   Assist Walk 10 feet on uneven surfaces activity did not occur: Safety/medical concerns (fatigue, pain, decreased balance/postural control)         Wheelchair     Assist Is the patient using a wheelchair?: Yes Type of Wheelchair: Manual Wheelchair activity did not occur: Safety/medical concerns (fatigue, pain, decreased balance/postural control)  Wheelchair assist level: Supervision/Verbal cueing Max wheelchair distance: 85'    Wheelchair 50 feet with 2 turns activity    Assist    Wheelchair 50 feet with 2 turns activity did not occur: Safety/medical concerns (fatigue, pain, decreased balance/postural control)   Assist Level: Supervision/Verbal cueing   Wheelchair 150 feet activity     Assist  Wheelchair 150 feet activity did not occur: Safety/medical concerns (fatigue, pain, decreased balance/postural control)        Medical Problem List and Plan: 1.   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  Antithrombotics: -DVT/anticoagulation: Venous Doppler studies negative Mechanical:  Antiembolism stockings, knee (TED hose) Bilateral lower extremities Sequential compression devices, below knee Bilateral lower extremities             -antiplatelet therapy: N/A 3. Lower extremity pain:  Robaxin scheduled on 8/14             -kpad for muscular pain in legs  Continue OxyContin started on 8/13  Continue gabapentin 300mg  TID  Continue voltaren gel to right thigh Left ankle pain likely mild liagamentous strain, may ice after therapy and wrap prior to therapy    4. Anxiety:  continue Atarax 25 mg twice daily as needed anxiety as well as 50 mg nightly as needed insomnia             -antipsychotic agents: N/A 5. Neuropsych: This patient is capable of making decisions on her  own behalf. 6. LUE forearm and RUE dorsum of hand IV infiltrations:  Placed nursing order for warm compress 15 minutes TID. Educated patient regarding benefits of this for pain control. PICC placed 7. Fluids/Electrolytes/Nutrition: Routine in and outs 8.  ID/Bacteremia/MRSA endocarditis.  Follow-up infectious disease..  Continue vancomycin through 12/19/2020 9.    Acute pulmonary emboli felt to be secondary to septic pulmonary emboli .initially on heparin which was discontinued as patient developed anemia.  Lower extremity Dopplers negative.  Pulmonary was involved no recommendations for ongoing anticoagulation at this time 10.  Anemia/leukocytosis: improving, monitor weekly 11.  Hyponatremia.   Sodium 132 on 8/15, may repeat outpatient  Continue to monitor 12.  Poorly controlled diabetes mellitus.  Hemoglobin A1c 12.3.   NovoLog 8 units 3 times daily, d/ced on 8/14 Semglee to 30 units daily on 8/13.  Diabetic teaching             -adjust regimen as needed  Labile on 8/18, monitor for trend, discontinue Ensure CBG (last 3)  Recent Labs    11/24/20 0547 11/24/20 0613 11/24/20 1157  GLUCAP 71 99 118*  Controlled 8/25 continue Semglee 29U. D/c SSI.  13.  Constipation. Continue Miralax BID 14. Dry nose: continue scheduled saline nasal spray BID  LOS: 13 days A FACE TO FACE EVALUATION WAS PERFORMED  Clint Bolder P Cythia Bachtel 11/24/2020, 1:34 PM

## 2020-11-24 NOTE — Progress Notes (Signed)
Physical Therapy Session Note  Patient Details  Name: Chelsea Mathis MRN: [id number] Date of Birth: 21-Jan-1991  Today's Date: 11/24/2020 PT Individual Time: 3570-1779; 3903-0092 PT Individual Time Calculation (min): 55 min and 70 min  Short Term Goals: Week 1:  PT Short Term Goal 1 (Week 1): pt will perform bed mobility with min A consistantly PT Short Term Goal 1 - Progress (Week 1): Progressing toward goal PT Short Term Goal 2 (Week 1): pt will transfer sit<>stand with LRAD and min A PT Short Term Goal 2 - Progress (Week 1): Met PT Short Term Goal 3 (Week 1): pt will ambulate 16f with LRAD and mod A PT Short Term Goal 3 - Progress (Week 1): Met Week 2:  PT Short Term Goal 1 (Week 2): LTG = STG due to LOS  Skilled Therapeutic Interventions/Progress Updates:  Session 1 Pt received supine in bed, denied pain in R hip. Emphasis of session on community reintegration and gait training on uneven surfaces. Pt performed bed mobility mod I w/bedrails. Sit <>stands throughout session w/supervision. Pt self-propelled from room to 1st floor outdoor patio mod I. Pt ambulated >400' w/RW and supervision on concrete, incline and decline w/intermittent seated rest breaks. Practiced turning around black poles w/RW, pt demonstrated good technique and maintained safe distance to RW w/supervision. Pt continues to demonstrate antalgic gait pattern, w/decreased stance time on RLE and decreased L step length. Exhibited significant slapping of LLE while ambulating on decline w/uncontrolled anterior weight shift, provided multimodal cues to to decrease step length, maintain safe distance to RW, and improve lateral weight shift to control foot placement. Pt ascended/descended a curb w/RW and supervision using forward technique, did not require additional verbal cues. Pt transported back to room w/total A 2/2 fatigue. Pt performed transfer from chair to bed w/RW and supervision and was left supine in bed w/all needs in  reach.   Session 2  Pt received supine in bed, very fatigued but denied pain. Emphasis of session on stretching techniques for pain management, establishing HEP and AD training. Pt laid flat in supine for 8 minutes for hip flexor stretch, progressed to lying prone w/min A for increased stretch x8 min. Pt able to tolerate stretch and educated on importance of positional changes for pain management. Pt rolled prone <>supine slowly w/min A for LE management, reported 6/10 pain in R hip and provided heat pack for pain relief. Created and implemented HEP for improved hip ROM, pain management and dynamic strengthening. Discussed each exercise w/pt and provided instructions for downloading app to her phone, pt verbalized understanding. Pt very lethargic during session and frequently dozed off, Dr. RRanell Patrickpresent for rounds and informed pt this was likely due to increase in muscle relaxants. Pt performed bed mobility w/supervision to sit EOB. Educated pt on safe use of rollator and pt performed sit <>stand to rollator and ambulated 25' w/supervision. Pt practiced turning to sit on rollator and feels comfortable using it, will be recommended for use at home. Pt was left supine in bed w/all needs in reach, reported pain as 4/10 in R hip, elevated RLE and provided heat pack for pain relief.   Therapy Documentation Precautions:  Precautions Precautions: Fall Precaution Comments: high levels of pain Restrictions Weight Bearing Restrictions: No  Therapy/Group: Individual Therapy JCruzita LedererPlaster, PT, DPT  11/24/2020, 7:43 AM

## 2020-11-24 NOTE — Progress Notes (Signed)
PHARMACY CONSULT NOTE FOR:  OUTPATIENT  PARENTERAL ANTIBIOTIC THERAPY (OPAT)  Indication: MRSA endocarditis Regimen: vancomycin 1750mg  IV q12h End date: 12/19/20  IV antibiotic discharge orders are pended. To discharging provider:  please sign these orders via discharge navigator,  Select New Orders & click on the button choice - Manage This Unsigned Work.     Thank you for allowing pharmacy to be a part of this patient's care.  Johnathan, Tortorelli 11/24/2020, 9:14 AM

## 2020-11-24 NOTE — Progress Notes (Signed)
Hypoglycemic Event  CBG:0647  Treatment: 8 oz juice/soda  Symptoms: None  Follow-up CBG: Time:0613 CBG Result:99  Possible Reasons for Event: Other: Patient states she is no able to consume her diet, because of the taste of the meals   Comments/MD notified:    Chelsea Mathis

## 2020-11-24 NOTE — Progress Notes (Signed)
Occupational Therapy Session Note  Patient Details  Name: Chelsea Mathis MRN: [id number] Date of Birth: 1991/01/26  Today's Date: 11/24/2020 OT Individual Time: 1120-1205 OT Individual Time Calculation (min): 45 min    Short Term Goals: Week 2:  OT Short Term Goal 1 (Week 2): Pt will complete 1/3 components of donning pants using AE as needed OT Short Term Goal 2 (Week 2): Pt will complete 1 grooming task while standing at the sink OT Short Term Goal 3 (Week 2): Pt will complete bathing at sit<stand level with CGA using AE as needed  Skilled Therapeutic Interventions/Progress Updates:    Pt seen this session for BADL training with a focus on use of AE and tolerance to movement.  Pt did very well today completing all tasks with Supervision. She had no difficulty using the reacher and sock aide to dress herself.  She needs general set up A as she has the continuous IV that gets in her way, so until she is off the IV she will still need S to set up.  She did have one leg spasm in which she had to sit and rest for a few minutes.  Great participation today. Resting in bed to eat lunch, alarm on and all needs met.   Therapy Documentation Precautions:  Precautions Precautions: Fall Precaution Comments: high levels of pain Restrictions Weight Bearing Restrictions: No     Pain:  6/10 thigh pain - premedicated   ADL: ADL Eating: Independent Grooming: Independent Where Assessed-Grooming: Sitting at sink Upper Body Bathing: Setup Where Assessed-Upper Body Bathing: Sitting at sink Lower Body Bathing: Supervision/safety Where Assessed-Lower Body Bathing: Wheelchair Upper Body Dressing: Setup Where Assessed-Upper Body Dressing: Wheelchair Lower Body Dressing: Supervision/safety Where Assessed-Lower Body Dressing: Sitting at sink, Standing at sink    Therapy/Group: Individual Therapy  [place] 11/24/2020, 12:19 PM

## 2020-11-25 LAB — GLUCOSE, CAPILLARY
Glucose-Capillary: 93 mg/dL (ref 70–99)
Glucose-Capillary: 79 mg/dL (ref 70–99)
Glucose-Capillary: 57 mg/dL — ABNORMAL LOW (ref 70–99)
Glucose-Capillary: 88 mg/dL (ref 70–99)
Glucose-Capillary: 133 mg/dL — ABNORMAL HIGH (ref 70–99)

## 2020-11-25 MED ORDER — VITAMIN B-6 50 MG PO TABS
50.0000 mg | ORAL_TABLET | Freq: Every day | ORAL | Status: DC
  Administered 2020-11-25 – 2020-11-29 (×5): 50 mg via ORAL
  Filled 2020-11-25 (×5): qty 1

## 2020-11-25 MED ORDER — CHLORHEXIDINE GLUCONATE CLOTH 2 % EX PADS
6.0000 | MEDICATED_PAD | Freq: Every day | CUTANEOUS | Status: DC
  Administered 2020-11-26 – 2020-11-28 (×3): 6 via TOPICAL

## 2020-11-25 NOTE — Progress Notes (Signed)
Physical Therapy Session Note  Patient Details  Name: Chelsea Mathis MRN: [id number] Date of Birth: 02-20-1991  Today's Date: 11/25/2020 PT Individual Time: 1350-1428 PT Individual Time Calculation (min): 38 min   Short Term Goals: Week 1:  PT Short Term Goal 1 (Week 1): pt will perform bed mobility with min A consistantly PT Short Term Goal 1 - Progress (Week 1): Progressing toward goal PT Short Term Goal 2 (Week 1): pt will transfer sit<>stand with LRAD and min A PT Short Term Goal 2 - Progress (Week 1): Met PT Short Term Goal 3 (Week 1): pt will ambulate 77f with LRAD and mod A PT Short Term Goal 3 - Progress (Week 1): Met  Skilled Therapeutic Interventions/Progress Updates:  Pt received supine in bed, reported fatigue but denied pain in R hip. Pt performed bed mobility w/supervision, emphasis of session of gait training w/rollator for improved safety. Sit <>stand from low EOB to rollator w/supervision and pt ambulated 80', 65', 70, and 45' w/rollator and supervision, several minutes of seated rest in between trials. Noted antalgic gait pattern and hip hiking on R side, pt denied it being due to pain. Once in room, pt performed bed mobility slowly w/supervision and reported 7/10 pain in R hip. Provided ice pack for pain relief and nursing notified. Pt was left supine in bed w/all needs in reach.   Therapy Documentation Precautions:  Precautions Precautions: Fall Precaution Comments: high levels of pain Restrictions Weight Bearing Restrictions: No   Therapy/Group: Individual Therapy JCruzita LedererPlaster, PT, DPT  11/25/2020, 7:53 AM

## 2020-11-25 NOTE — Plan of Care (Signed)
  Problem: Consults Goal: RH GENERAL PATIENT EDUCATION Description: See Patient Education module for education specifics. Outcome: Progressing Goal: Diabetes Guidelines if Diabetic/Glucose > 140 Description: If diabetic or lab glucose is > 140 mg/dl - Initiate Diabetes/Hyperglycemia Guidelines & Document Interventions  Outcome: Progressing   Problem: RH SAFETY Goal: RH STG DECREASED RISK OF FALL WITH ASSISTANCE Description: STG Decreased Risk of Fall With cues and reminders. Outcome: Progressing   Problem: RH PAIN MANAGEMENT Goal: RH STG PAIN MANAGED AT OR BELOW PT'S PAIN GOAL Description: < 3 on a 0-10 pain scale. Outcome: Progressing   Problem: RH KNOWLEDGE DEFICIT GENERAL Goal: RH STG INCREASE KNOWLEDGE OF SELF CARE AFTER HOSPITALIZATION Description: Patient will demonstrate knowledge of medication management, pain management, safety awareness with educational materials and handouts provided by staff independently at discharge. Outcome: Progressing

## 2020-11-25 NOTE — Progress Notes (Signed)
Occupational Therapy Session Note  Patient Details  Name: Chelsea Mathis MRN: [id number] Date of Birth: [birth year]  Today's Date: 11/25/2020 OT Individual Time: 1000-1100 OT Individual Time Calculation (min): 60 min    Short Term Goals: Week 2:  OT Short Term Goal 1 (Week 2): Pt will complete 1/3 components of donning pants using AE as needed OT Short Term Goal 2 (Week 2): Pt will complete 1 grooming task while standing at the sink OT Short Term Goal 3 (Week 2): Pt will complete bathing at sit<stand level with CGA using AE as needed  Skilled Therapeutic Interventions/Progress Updates:  Pt greeted supine in bed agreeable to OT intervention. Session focus on BADL reeducation with pt able to complete first shower ( covered PIC during shower)! Pt able to collect ADL items with Rw with overall gross supervision. Pt completed ambulatory shower transfer to walkin shower (TTB). Pt completed bathing with overall CGA only for sit<>stand to wash posterior periarea. Pt completed ambulatory transfer out of shower to w/c for dressing. MOD I for dressing from w/c level. Pt able to stand for oral care at sink with supervision.  Pt transported to group session by this OTA.         Therapy Documentation Precautions:  Precautions Precautions: Fall Precaution Comments: high levels of pain Restrictions Weight Bearing Restrictions: No    Pain: pt reports mild pain during session, offered rest breaks and repositioning as pain mgmt strategy.    Therapy/Group: Individual Therapy  Barron Schmid 11/25/2020, 12:31 PM

## 2020-11-25 NOTE — Progress Notes (Signed)
Occupational Therapy Session Note  Patient Details  Name: Chelsea Mathis MRN: [id number] Date of Birth: 22-Jul-1990  Today's Date: 11/25/2020 OT Group Time: 1101-1200 OT Group Time Calculation (min): 59 min   Short Term Goals: Week 2:  OT Short Term Goal 1 (Week 2): Pt will complete 1/3 components of donning pants using AE as needed OT Short Term Goal 2 (Week 2): Pt will complete 1 grooming task while standing at the sink OT Short Term Goal 3 (Week 2): Pt will complete bathing at sit<stand level with CGA using AE as needed  Skilled Therapeutic Interventions/Progress Updates:  Pt participated in group session with a focus on stress mgmt, education on healthy coping strategies, and social interaction.  Session focus on breaking down stressors into "daily hassles," "major life stressors" and "life circumstances" in effort to allow pts to chunk their stressors into groups. Pt actively sharing stressors and contributing to group conversation. Offered education on factors that protect [place] against stress such as "daily uplifts," "healthy coping strategies" and "protective factors." Encouraged all group members to make an effort to actively recall one event from their day that was a daily uplift in an effort to protect their mindset from stressors. Issued pt handouts on healthy coping strategies to implement into routine. Pt transported back to room by RT.  Therapy Documentation Precautions:  Precautions Precautions: Fall Precaution Comments: high levels of pain Restrictions Weight Bearing Restrictions: No  Pain: Pt reports no pain during group session.    Therapy/Group: Group Therapy  Barron Schmid 11/25/2020, 3:35 PM

## 2020-11-25 NOTE — Progress Notes (Signed)
[facility] PHYSICAL MEDICINE & REHABILITATION PROGRESS NOTE  Subjective/Complaints: Pain still present but improved- gel and increased muscle relaxers appear to be helping   ROS: Denies CP, SOB, N/V/D, +right thigh pain  Objective: Vital Signs: Blood pressure 125/76, pulse 90, temperature 98.1 F (36.7 C), temperature source Oral, resp. rate 18, height 5\' 4"  (1.626 m), weight 82 kg, last menstrual period 10/31/2020, SpO2 98 %, unknown if currently breastfeeding. No results found. No results for input(s): WBC, HGB, HCT, PLT in the last 72 hours.  No results for input(s): NA, K, CL, CO2, GLUCOSE, BUN, CREATININE, CALCIUM in the last 72 hours.   Intake/Output Summary (Last 24 hours) at 11/25/2020 1248 Last data filed at 11/25/2020 0717 Gross per 24 hour  Intake 620 ml  Output 650 ml  Net -30 ml        Physical Exam: BP 125/76 (BP Location: Left Leg)   Pulse 90   Temp 98.1 F (36.7 C) (Oral)   Resp 18   Ht 5\' 4"  (1.626 m)   Wt 82 kg   LMP 10/31/2020   SpO2 98%   BMI 31.03 kg/m  Gen: no distress, normal appearing HEENT: oral mucosa pink and moist, NCAT Cardio: Reg rate Chest: normal effort, normal rate of breathing Abd: soft, non-distended Ext: no edema Psych: pleasant, normal affect Skin: intact  Musc: Right lower extremity edema.  No tenderness in extremities.tenderness along RIght lateral thigh Left ankle without swelling or ecchymosis, mild TTP over deltoid ligament  Neuro: Alert, speech slowed Motor: Left lower extremity: 4 -/5 proximal to distal (pain inhibition), stable Right lower extremity: Hip flexion, knee extension 2+/5, ankle dorsiflexion 4 -/5 (pain inhibition), stable  Assessment/Plan: 1. Functional deficits which require 3+ hours per day of interdisciplinary therapy in a comprehensive inpatient rehab setting. Physiatrist is providing close team supervision and 24 hour management of active medical problems listed below. Physiatrist and rehab team  continue to assess barriers to discharge/monitor patient progress toward functional and medical goals   Care Tool:  Bathing    Body parts bathed by patient: Right arm, Left arm, Chest, Abdomen, Front perineal area, Right upper leg, Left upper leg, Right lower leg, Left lower leg, Face, Buttocks   Body parts bathed by helper: Buttocks     Bathing assist Assist Level: Contact Guard/Touching assist (using LH sponge)     Upper Body Dressing/Undressing Upper body dressing   What is the patient wearing?: Pull over shirt, Bra    Upper body assist Assist Level: Independent with assistive device    Lower Body Dressing/Undressing Lower body dressing    Lower body dressing activity did not occur: Safety/medical concerns What is the patient wearing?: Underwear/pull up, Pants     Lower body assist Assist for lower body dressing: Independent with assitive device     Toileting Toileting Toileting Activity did not occur (Clothing management and hygiene only): N/A (no void or bm)  Toileting assist Assist for toileting: Moderate Assistance - Patient 50 - 74%     Transfers Chair/bed transfer  Transfers assist     Chair/bed transfer assist level: Supervision/Verbal cueing     Locomotion Ambulation   Ambulation assist   Ambulation activity did not occur: Safety/medical concerns (fatigue, pain, decreased balance/postural control)  Assist level: Supervision/Verbal cueing Assistive device: Walker-rolling Max distance: 150'   Walk 10 feet activity   Assist  Walk 10 feet activity did not occur: Safety/medical concerns (fatigue, pain, decreased balance/postural control)  Assist level: Supervision/Verbal cueing Assistive device: Walker-rolling  Walk 50 feet activity   Assist Walk 50 feet with 2 turns activity did not occur: Safety/medical concerns (fatigue, pain, decreased balance/postural control)  Assist level: Supervision/Verbal cueing Assistive device: Walker-rolling     Walk 150 feet activity   Assist Walk 150 feet activity did not occur: Safety/medical concerns (fatigue, pain, decreased balance/postural control)  Assist level: Supervision/Verbal cueing Assistive device: Walker-rolling    Walk 10 feet on uneven surface  activity   Assist Walk 10 feet on uneven surfaces activity did not occur: Safety/medical concerns (fatigue, pain, decreased balance/postural control)   Assist level: Supervision/Verbal cueing Assistive device: Walker-rolling   Wheelchair     Assist Is the patient using a wheelchair?: Yes Type of Wheelchair: Manual Wheelchair activity did not occur: Safety/medical concerns (fatigue, pain, decreased balance/postural control)  Wheelchair assist level: Independent Max wheelchair distance: >39'    Wheelchair 50 feet with 2 turns activity    Assist    Wheelchair 50 feet with 2 turns activity did not occur: Safety/medical concerns (fatigue, pain, decreased balance/postural control)   Assist Level: Independent   Wheelchair 150 feet activity     Assist  Wheelchair 150 feet activity did not occur: Safety/medical concerns (fatigue, pain, decreased balance/postural control)   Assist Level: Independent    Medical Problem List and Plan: 1.   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  Antithrombotics: -DVT/anticoagulation: Venous Doppler studies negative Mechanical:  Antiembolism stockings, knee (TED hose) Bilateral lower extremities Sequential compression devices, below knee Bilateral lower extremities             -antiplatelet therapy: N/A 3. Lower extremity pain:  Robaxin scheduled on 8/14             -kpad for muscular pain in legs  Continue OxyContin started on 8/13  Continue gabapentin 300mg  TID  Continue voltaren gel to right thigh Left ankle pain likely mild liagamentous strain, may ice after therapy and wrap  prior to therapy   Add B6 50mg  daily to help with sensitivity to pain 4. Anxiety: continue Atarax 25 mg twice daily as needed anxiety as well as 50 mg nightly as needed insomnia             -antipsychotic agents: N/A 5. Neuropsych: This patient is capable of making decisions on her  own behalf. 6. LUE forearm and RUE dorsum of hand IV infiltrations:  Placed nursing order for warm compress 15 minutes TID. Educated patient regarding benefits of this for pain control. PICC placed 7. Fluids/Electrolytes/Nutrition: Routine in and outs 8.  ID/Bacteremia/MRSA endocarditis.  Follow-up infectious disease..  Continue vancomycin through 12/19/2020 9.    Acute pulmonary emboli felt to be secondary to septic pulmonary emboli .initially on heparin which was discontinued as patient developed anemia.  Lower extremity Dopplers negative.  Pulmonary was involved no recommendations for ongoing anticoagulation at this time 10.  Anemia/leukocytosis: improving, monitor weekly 11.  Hyponatremia.   Sodium 132 on 8/15, may repeat outpatient  Continue to monitor 12.  Poorly controlled diabetes mellitus.  Hemoglobin A1c 12.3.   NovoLog 8 units 3 times daily, d/ced on 8/14 Semglee to 30 units daily on 8/13.  Diabetic teaching             -adjust regimen as needed  Labile on 8/18, monitor for trend, discontinue Ensure CBG (last 3)  Recent Labs    11/24/20 2108 11/25/20 0540 11/25/20 1205  GLUCAP 116* 93 79  Controlled 8/25 continue Semglee 29U. D/c SSI. Decrease CBG to daily- excellent control.   13.  Constipation. Continue Miralax BID 14. Dry nose: continue scheduled saline nasal spray BID  LOS: 14 days A FACE TO FACE EVALUATION WAS PERFORMED  Drema Pry My Rinke 11/25/2020, 12:48 PM

## 2020-11-25 NOTE — Progress Notes (Signed)
Physical Therapy Session Note  Patient Details  Name: Chelsea Mathis MRN: [id number] Date of Birth: [birth year]  Today's Date: 11/25/2020 PT Individual Time: 7106-2694 PT Individual Time Calculation (min): 31 min   Short Term Goals: Week 1:  PT Short Term Goal 1 (Week 1): pt will perform bed mobility with min A consistantly PT Short Term Goal 1 - Progress (Week 1): Progressing toward goal PT Short Term Goal 2 (Week 1): pt will transfer sit<>stand with LRAD and min A PT Short Term Goal 2 - Progress (Week 1): Met PT Short Term Goal 3 (Week 1): pt will ambulate 15f with LRAD and mod A PT Short Term Goal 3 - Progress (Week 1): Met Week 2:  PT Short Term Goal 1 (Week 2): LTG = STG due to LOS  Skilled Therapeutic Interventions/Progress Updates:     Patient received in bed, very pleasant and cooperative with therapy. Able to generally mobilize on a supervision basis with use of bed features and RW, but limited by pain today (already pre-medicated) in R hip. Independent with WC mobility. Spent increased time working on balance today- does require up to MinA for balance while practicing narrow BOS and narrow stance with one foot forward without BUE support. Note ongoing weakness and pain driven muscle inhibition R hip musculature especially. Otherwise continued trialing gait with rollator- able to perform with supervision but very antalgic and gait distance significantly limited by R hip pain; might do better using RW for longer distances due to improved ability to offload painful R hip with this device. [facility] attending preparing to help patient back to bed.   Therapy Documentation Precautions:  Precautions Precautions: Fall Precaution Comments: high levels of pain Restrictions Weight Bearing Restrictions: No General: PT Amount of Missed Time (min): 14 Minutes PT Missed Treatment Reason: Nursing care (RN setting up vanc IV) Vital Signs: Therapy Vitals Temp:  98 F (36.7 C) Temp Source: Oral Pulse Rate: 94 Resp: 17 BP: (!) 151/92 Patient Position (if appropriate): Sitting Oxygen Therapy SpO2: 98 % O2 Device: Room Air Pain: Pain Assessment Pain Scale: 0-10 Pain Score: 8  Pain Type: Acute pain Pain Location: Hip Pain Orientation: Right;Left Pain Descriptors / Indicators: Aching;Grimacing;Sore Pain Onset: With Activity Patients Stated Pain Goal: 4 Pain Intervention(s): Medication (See eMAR);Ambulation/increased activity;RN made aware Multiple Pain Sites: No     Therapy/Group: Individual Therapy  KWindell Norfolk DPT, PN2   Supplemental Physical Therapist CNora   Pager [phone] Rehab Office [phone] 11/25/2020, 2:59 PM

## 2020-11-25 NOTE — Progress Notes (Signed)
Hypoglycemic Event  CBG: 57  Treatment: 8 oz juice/soda  Symptoms: None  Follow-up CBG: Time:1745 CBG Result:88  Possible Reasons for Event: Inadequate meal intake  Comments/MD notified:Charge notified and protocol followed.    Chelsea Mathis

## 2020-11-25 NOTE — Progress Notes (Signed)
Patient ID: Chelsea Mathis, female   DOB: Aug 14, 1990, 30 y.o.   MRN: [id number]  Met with pt to inform her [facility] will be at her home on Tuesday @ 5:00 to do education regarding IV antibiotic, will get first dose here then second once home. Equipment ordered and should be delivered Monday and still waiting for [facility] to see if can take for PT & OT follow up.

## 2020-11-25 NOTE — Evaluation (Signed)
Recreational Therapy Assessment and Plan  Patient Details  Name: Chelsea Mathis MRN: [id number] Date of Birth: [birth year] Today's Date: 11/25/2020  Rehab Potential:  Good ELOS:   d/c 8/30 Assessment Hospital Problem: Principal Problem:   Myositis     Past Medical History:      Past Medical History:  Diagnosis Date   Diabetes mellitus ([place])     Gestational diabetes      Past Surgical History:       Past Surgical History:  Procedure Laterality Date   LAPAROSCOPIC APPENDECTOMY N/A 11/11/2016    Procedure: APPENDECTOMY LAPAROSCOPIC;  Surgeon: Georganna Skeans, MD;  Location: [place];  Service: General;  Laterality: N/A;   TEE WITHOUT CARDIOVERSION N/A 11/03/2020    Procedure: TRANSESOPHAGEAL ECHOCARDIOGRAM (TEE);  Surgeon: Buford Dresser, MD;  Location: [facility] ENDOSCOPY;  Service: Cardiovascular;  Laterality: N/A;      Assessment & Plan Clinical Impression: Patient is a 30 y.o. year old female with history of uncontrolled diabetes mellitus as well as recent UTI.  Per chart review patient lives with significant other.  Reportedly independent prior to admission working for [employer].  Two-level home with 1-2 steps to entry.  Presented 10/31/2020 after patient reportedly was lifting a box much heavier than she thought it was noting right groin pain that increased and wraparound to her hip and low back.  She was seen by urgent care on 10/01/2020 for UTI given nitrofurantoin for 5 days.  On 7/17 she was seen again at urgent care felt to have UTI with hematuria and a muscle spasm in her right hip.  She was given a Toradol injection and a prescription for Augmentin x5 days.  On 7/18 her vaginal panel came back positive for bacterial vaginal infection and was given a prescription for Flagyl.  She again returned to the ER on 7/24 with right thigh pain of unclear etiology.  X-rays reportedly were completed negative as well as right lower extremity venous Doppler studies negative.  She was given a short  course of pain medication(Norco) recommended follow-up with sports medicine.  She returned to the ER again 8/1 with reports of elevated blood sugars swelling to her hands and lower extremities and generalized pain.  She had followed at home prior to this latest arrival and reported generalized weakness.  CT of the right femur and abdomen/pelvis assessed showing low level edema tracking along the superficial fascia of the thigh and some mild infiltrative edema along the deep fascia planes in the thigh.  CT of abdomen pelvis revealed multiple cavitary lesions within the right lower lobe.  Labs noted severe hyponatremia 103 potassium 4.5 chloride 75 glucose 559 BUN 34 creatinine 1.01 alkaline phosphatase 203 albumin 2.1 indirect bilirubin 3.7 WBC 24,900 platelets 175,000 lactic acid 4.6.  She was placed on intravenous fluids and insulin drip in the ER.  Cultures returned showing staph aureus bacteremia/sepsis.  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  Repeat blood cultures 11/07/2020 negative.  Patient was placed on vancomycin x6 weeks through 12/19/2020.  Her acute pulmonary emboli felt to be secondary to septic pulmonary emboli initially placed on heparin later discontinued as she developed anemia as noted lower extremity Dopplers negative pulmonary service was involved no recommendations for ongoing anticoagulation at this time.  Acute on chronic anemia she was transfused 1 unit packed red blood cells with latest hemoglobin 7.5 and leukocytosis 19,400 improved from  23,800.  She did develop paroxysmal atrial fibrillation requiring amiodarone cardiac rate controlled amiodarone discontinued.  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  Her  initial CK was 586 improved to 177.  Psychiatry was consulted for patient with mixed anxiety depression during hospital stay likely secondary to acute illness and Atarax was added as needed for anxiety.  Therapy evaluations completed due to patient decreased functional mobility was admitted for a comprehensive rehab program   Met with pt today to discuss TR services, including activity analysis identifying potential modifications & stress management.  Pt presents with decreased activity tolerance, decreased functional mobility, decreased balance, feelings of stress/anxiety Limiting pt's independence with leisure/community pursuits.    Plan  Min 1 TR session for coping during LOS  Recommendations for other services: Neuropsych  Discharge Criteria: Patient will be discharged from TR if patient refuses treatment 3 consecutive times without medical reason.  If treatment goals not met, if there is a change in medical status, if patient makes no progress towards goals or if patient is discharged from hospital.  The above assessment, treatment plan, treatment alternatives and goals were discussed and mutually agreed upon: by patient   Session note: Time:  11-12 Pain:  no c/o  Pt referred by team for participation in a stress management/coping group.  Pt educated and participatory in group discussion on stress exploration identifying factors that contribute to stress, factors that protect against stress, coping strategies including deep breathing, progressive muscle relaxation, challenging irrational thoughts and imagery.  Pt appreciative of this group and education materials provided.  No further TR as pt is expected to discharge 8/30.  [place] 11/25/2020, 3:54 PM

## 2020-11-26 LAB — GLUCOSE, CAPILLARY
Glucose-Capillary: 59 mg/dL — ABNORMAL LOW (ref 70–99)
Glucose-Capillary: 109 mg/dL — ABNORMAL HIGH (ref 70–99)
Glucose-Capillary: 84 mg/dL (ref 70–99)
Glucose-Capillary: 81 mg/dL (ref 70–99)
Glucose-Capillary: 121 mg/dL — ABNORMAL HIGH (ref 70–99)

## 2020-11-26 MED ORDER — INSULIN GLARGINE-YFGN 100 UNIT/ML ~~LOC~~ SOLN
24.0000 [IU] | Freq: Every day | SUBCUTANEOUS | Status: DC
  Filled 2020-11-26 (×2): qty 0.24

## 2020-11-26 MED ORDER — POLYETHYLENE GLYCOL 3350 17 G PO PACK: 17.0000 g | PACK | Freq: Every day | ORAL | Status: DC | PRN

## 2020-11-26 NOTE — Progress Notes (Signed)
Physical Therapy Session Note  Patient Details  Name: Chelsea Mathis MRN: [id number] Date of Birth: 04/12/1990  Today's Date: 11/26/2020 PT Individual Time: 0900-0959 PT Individual Time Calculation (min): 59 min   Short Term Goals: Week 1:  PT Short Term Goal 1 (Week 1): pt will perform bed mobility with min A consistantly PT Short Term Goal 1 - Progress (Week 1): Progressing toward goal PT Short Term Goal 2 (Week 1): pt will transfer sit<>stand with LRAD and min A PT Short Term Goal 2 - Progress (Week 1): Met PT Short Term Goal 3 (Week 1): pt will ambulate 25f with LRAD and mod A PT Short Term Goal 3 - Progress (Week 1): Met Week 2:  PT Short Term Goal 1 (Week 2): LTG = STG due to LOS  Skilled Therapeutic Interventions/Progress Updates:  Patient seated upright in w/c on entrance to room. Patient alert but with head down, ears plugged, and teary-eyed. IV infusion pump alarm is actively beeping and causing pt's anxiety. Alarm turned off and RN notified re: alarm. RN notified IV team for reset and disconnect. Pt agreeable to PT session. Patient related low level pain while seated that increased significantly with WB upon standing with weekend MD present. Pt premedicated 30 min prior to session but pt stating that medications usually take ~ an hour to work. Pt relates to MD that she is having issues with blood sugar levels while on long acting insulin. Blood sugar levels have been low the previous evening and again this morning. MD to look into change in medication.  Therapeutic Activity: Transfers: Patient performed STS transfers throughout session with focus on equal weight bearing and decreased use of hands to assist. SPVT transfers performed with HHA initially requiring increased UE support with WB to RLE and decreasing throughout session.  Provided verbal cues for reducing UE pressure.  Gait Training:  At start of session, pt attempted to ambulate out of room barely reaching doorway for  pain with use of rollator and CGA. Allowed time for apin medication, ambulation at end of session with improved quality of gait. Patient ambulated 110' x1 with two standing rest breaks. Completed using rollator with CGA and close w/c follow d/t previously exhibited pain.  Demonstrated good quality of gait initially that breaks down with fatigue and increase in pain. Minimal hip hike noted. Provided vc/ tc for level hips, posterior pelvic tilt.  Neuromuscular Re-ed: NMR facilitated during session with focus on equal weight bearing, muscle activation/ facilitation, motor control. Pt guided in seated RLE toe taps to 4" step with step 2" away from resting placement. Pt demos improvement throughout with reduced compensatory trunk involvement. Standing toe taps performed well with tc/ vc for maintaining level pelvis and post pelvic tilt with return to upright stance. Pt then guided in minisquats with tc for pelvic tilt at end of extensor phase. Focus on weight shift to toward R side for equal WB throughout. NMR performed for improvements in motor control and coordination, balance, sequencing, judgement, and self confidence/ efficacy in performing all aspects of mobility at highest level of independence.   Therapeutic Exercise: Patient performed seated abdominal therex progressing from sitting back to target distance set by therapist's hand, then pillow props on brown wedge. Next progressed to chest pass with ball and coordinated backward lean with sit-up return ball pass. VC/ tc for proper technique and coordinated whole core/ trunk stabilization with coordinated breathing.  Patient seated upright  in w/c initially waiting for NT to change bed linens  prior to return to bed at end of session with brakes locked, no alarm set with NT present, and all needs within reach. NT to assist with return transfer to bed.      Therapy Documentation Precautions:  Precautions Precautions: Fall Precaution Comments: high  levels of pain Restrictions Weight Bearing Restrictions: No  Therapy/Group: Individual Therapy  Alger Simons PT, DPT 11/26/2020, 12:19 PM

## 2020-11-26 NOTE — Progress Notes (Signed)
[facility] PHYSICAL MEDICINE & REHABILITATION PROGRESS NOTE  Subjective/Complaints:  Pt reports still having R hip/leg pain - esp with walking-  Wincing with each step.  Pt upset about her BG's dropping- 2x in 24 hours.  Asked me to make miralax prn since stools too much/too soft.   ROS:  Pt denies SOB, abd pain, CP, N/V/C/D, and vision changes  Objective: Vital Signs: Blood pressure 114/68, pulse 86, temperature 98.4 F (36.9 C), temperature source Oral, resp. rate 17, height 5\' 4"  (1.626 m), weight 83.4 kg, last menstrual period 10/31/2020, SpO2 98 %, unknown if currently breastfeeding. No results found. No results for input(s): WBC, HGB, HCT, PLT in the last 72 hours.  No results for input(s): NA, K, CL, CO2, GLUCOSE, BUN, CREATININE, CALCIUM in the last 72 hours.   Intake/Output Summary (Last 24 hours) at 11/26/2020 1543 Last data filed at 11/26/2020 0900 Gross per 24 hour  Intake 360 ml  Output --  Net 360 ml        Physical Exam: BP 114/68 (BP Location: Left Leg)   Pulse 86   Temp 98.4 F (36.9 C) (Oral)   Resp 17   Ht 5\' 4"  (1.626 m)   Wt 83.4 kg   LMP 10/31/2020   SpO2 98%   BMI 31.56 kg/m    General: awake, alert, appropriate, sitting on rollator in room; PT with her;  NAD HENT: conjugate gaze; oropharynx moist CV: regular rate; no JVD Pulmonary: CTA B/L; no W/R/R- good air movement GI: soft, NT, ND, (+)BS; protuberant Psychiatric: appropriate Neurological: Alert Gait: wincing and favoring RLE with each step/using RW with PT.  Musc: Right lower extremity edema.  No tenderness in extremities.tenderness along RIght lateral thigh Left ankle without swelling or ecchymosis, mild TTP over deltoid ligament  Neuro: Alert, speech slowed Motor: Left lower extremity: 4 -/5 proximal to distal (pain inhibition), stable Right lower extremity: Hip flexion, knee extension 2+/5, ankle dorsiflexion 4 -/5 (pain inhibition), stable  Assessment/Plan: 1. Functional  deficits which require 3+ hours per day of interdisciplinary therapy in a comprehensive inpatient rehab setting. Physiatrist is providing close team supervision and 24 hour management of active medical problems listed below. Physiatrist and rehab team continue to assess barriers to discharge/monitor patient progress toward functional and medical goals   Care Tool:  Bathing    Body parts bathed by patient: Right arm, Left arm, Chest, Abdomen, Front perineal area, Right upper leg, Left upper leg, Right lower leg, Left lower leg, Face, Buttocks   Body parts bathed by helper: Buttocks     Bathing assist Assist Level: Contact Guard/Touching assist (using LH sponge)     Upper Body Dressing/Undressing Upper body dressing   What is the patient wearing?: Pull over shirt, Bra    Upper body assist Assist Level: Independent with assistive device    Lower Body Dressing/Undressing Lower body dressing    Lower body dressing activity did not occur: Safety/medical concerns What is the patient wearing?: Underwear/pull up, Pants     Lower body assist Assist for lower body dressing: Independent with assitive device     Toileting Toileting Toileting Activity did not occur (Clothing management and hygiene only): N/A (no void or bm)  Toileting assist Assist for toileting: Moderate Assistance - Patient 50 - 74%     Transfers Chair/bed transfer  Transfers assist     Chair/bed transfer assist level: Supervision/Verbal cueing     Locomotion Ambulation   Ambulation assist   Ambulation activity did not occur:  Safety/medical concerns (fatigue, pain, decreased balance/postural control)  Assist level: Supervision/Verbal cueing Assistive device: Rollator Max distance: 80'   Walk 10 feet activity   Assist  Walk 10 feet activity did not occur: Safety/medical concerns (fatigue, pain, decreased balance/postural control)  Assist level: Supervision/Verbal cueing Assistive device: Rollator    Walk 50 feet activity   Assist Walk 50 feet with 2 turns activity did not occur: Safety/medical concerns (fatigue, pain, decreased balance/postural control)  Assist level: Supervision/Verbal cueing Assistive device: Rollator    Walk 150 feet activity   Assist Walk 150 feet activity did not occur: Safety/medical concerns (fatigue, pain, decreased balance/postural control)  Assist level: Supervision/Verbal cueing Assistive device: Walker-rolling    Walk 10 feet on uneven surface  activity   Assist Walk 10 feet on uneven surfaces activity did not occur: Safety/medical concerns (fatigue, pain, decreased balance/postural control)   Assist level: Supervision/Verbal cueing Assistive device: Walker-rolling   Wheelchair     Assist Is the patient using a wheelchair?: Yes Type of Wheelchair: Manual Wheelchair activity did not occur: Safety/medical concerns (fatigue, pain, decreased balance/postural control)  Wheelchair assist level: Independent Max wheelchair distance: >44'    Wheelchair 50 feet with 2 turns activity    Assist    Wheelchair 50 feet with 2 turns activity did not occur: Safety/medical concerns (fatigue, pain, decreased balance/postural control)   Assist Level: Independent   Wheelchair 150 feet activity     Assist  Wheelchair 150 feet activity did not occur: Safety/medical concerns (fatigue, pain, decreased balance/postural control)   Assist Level: Independent    Medical Problem List and Plan: 1.   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  Antithrombotics: -DVT/anticoagulation: Venous Doppler studies negative Mechanical:  Antiembolism stockings, knee (TED hose) Bilateral lower extremities Sequential compression devices, below knee Bilateral lower extremities             -antiplatelet therapy: N/A 3. Lower extremity pain:  Robaxin scheduled on  8/14             -kpad for muscular pain in legs  Continue OxyContin started on 8/13  Continue gabapentin 300mg  TID  Continue voltaren gel to right thigh Left ankle pain likely mild liagamentous strain, may ice after therapy and wrap prior to therapy   Add B6 50mg  daily to help with sensitivity to pain  8/27- pain stable but bothersome- con't regimen 4. Anxiety: continue Atarax 25 mg twice daily as needed anxiety as well as 50 mg nightly as needed insomnia             -antipsychotic agents: N/A 5. Neuropsych: This patient is capable of making decisions on her  own behalf. 6. LUE forearm and RUE dorsum of hand IV infiltrations:  Placed nursing order for warm compress 15 minutes TID. Educated patient regarding benefits of this for pain control. PICC placed 7. Fluids/Electrolytes/Nutrition: Routine in and outs 8.  ID/Bacteremia/MRSA endocarditis.  Follow-up infectious disease..  Continue vancomycin through 12/19/2020 9.    Acute pulmonary emboli felt to be secondary to septic pulmonary emboli .initially on heparin which was discontinued as patient developed anemia.  Lower extremity Dopplers negative.  Pulmonary was involved no recommendations for ongoing anticoagulation at this time 10.  Anemia/leukocytosis: improving, monitor weekly 11.  Hyponatremia.   Sodium 132 on 8/15,   8/27- labs MOnday  Continue to monitor 12.  Poorly controlled diabetes mellitus.  Hemoglobin A1c 12.3.   NovoLog  8 units 3 times daily, d/ced on 8/14 Semglee to 30 units daily on 8/13.  Diabetic teaching             -adjust regimen as needed  Labile on 8/18, monitor for trend, discontinue Ensure CBG (last 3)  Recent Labs    11/26/20 0550 11/26/20 0630 11/26/20 1149  GLUCAP 59* 109* 84    8/27- decreased Insulin  to 24 units from 29 units- had 2 episodes in 2 days of 57/59 BG's. Was NOT on insulin at home- will need insulin teaching before d/c.   13.  Constipation. Continue Miralax BID  8/27- pt insisted change  miralax to prn 14. Dry nose: continue scheduled saline nasal spray BID  LOS: 15 days A FACE TO FACE EVALUATION WAS PERFORMED  Benjie Ricketson 11/26/2020, 3:43 PM

## 2020-11-26 NOTE — Progress Notes (Signed)
Occupational Therapy Session Note  Patient Details  Name: Chelsea Mathis MRN: [id number] Date of Birth: 03/29/1991  Today's Date: 11/26/2020 OT Individual Time: 0703-0800 OT Individual Time Calculation (min): 57 min    Short Term Goals: Week 1:  OT Short Term Goal 1 (Week 1): Pt will complete LB self care at sit<stand level during 2 consecutive OT sessions as evidence of improved pain mgt OT Short Term Goal 1 - Progress (Week 1): Met OT Short Term Goal 2 (Week 1): Pt will complete 1 grooming task while standing at the sink to increase standing endurance OT Short Term Goal 2 - Progress (Week 1): Progressing toward goal OT Short Term Goal 3 (Week 1): Pt will complete 1/3 components of donning pants using AE as needed OT Short Term Goal 3 - Progress (Week 1): Progressing toward goal  Skilled Therapeutic Interventions/Progress Updates:     Pt received in bed with 4 out of 10 pain and pt "content" with RLE feeling.   ADL:  Pt completes full BADL with set up (BSC at EOB for toileting and sink level bathing). Pt educated on doffing pants off feet in seated for safety v standing to decrease risk of falling. Pt required elevated EOB for first stand and MIN A for moving RLE to EOB for "first thing in the morning." Educated on strategy to use gait belt or leg lifter and keep in bed for mobility first thing in the morning with increased stiffness. Alternative feet drying strategies offered. Pt able to wash peri area in standing with set up and no UE support on sink. Exited session with pt seated in w/c, exit alarm on and call light in reach   Therapy Documentation Precautions:  Precautions Precautions: Fall Precaution Comments: high levels of pain Restrictions Weight Bearing Restrictions: No General:   Other Treatments:     Therapy/Group: Individual Therapy  Tonny Branch 11/26/2020, 6:51 AM

## 2020-11-26 NOTE — Plan of Care (Signed)
  Problem: Consults Goal: RH GENERAL PATIENT EDUCATION Description: See Patient Education module for education specifics. Outcome: Progressing Goal: Diabetes Guidelines if Diabetic/Glucose > 140 Description: If diabetic or lab glucose is > 140 mg/dl - Initiate Diabetes/Hyperglycemia Guidelines & Document Interventions  Outcome: Progressing   Problem: RH SAFETY Goal: RH STG DECREASED RISK OF FALL WITH ASSISTANCE Description: STG Decreased Risk of Fall With cues and reminders. Outcome: Progressing   Problem: RH PAIN MANAGEMENT Goal: RH STG PAIN MANAGED AT OR BELOW PT'S PAIN GOAL Description: < 3 on a 0-10 pain scale. Outcome: Progressing   Problem: RH KNOWLEDGE DEFICIT GENERAL Goal: RH STG INCREASE KNOWLEDGE OF SELF CARE AFTER HOSPITALIZATION Description: Patient will demonstrate knowledge of medication management, pain management, safety awareness with educational materials and handouts provided by staff independently at discharge. Outcome: Progressing   

## 2020-11-26 NOTE — Progress Notes (Signed)
Hypoglycemic Event  CBG 59  Treatment: 8 oz juice/soda  Symptoms: None  Follow-up CBG: Time:0625 CBG Resul 109  Possible Reasons for Event: Unknown  Comments/MD  notified:CN Patient is asymtomatic  Chelsea Mathis

## 2020-11-27 LAB — GLUCOSE, CAPILLARY
Glucose-Capillary: 84 mg/dL (ref 70–99)
Glucose-Capillary: 92 mg/dL (ref 70–99)
Glucose-Capillary: 114 mg/dL — ABNORMAL HIGH (ref 70–99)
Glucose-Capillary: 143 mg/dL — ABNORMAL HIGH (ref 70–99)

## 2020-11-27 MED ORDER — TIZANIDINE HCL 2 MG PO TABS
2.0000 mg | ORAL_TABLET | Freq: Three times a day (TID) | ORAL | Status: DC
  Administered 2020-11-27 – 2020-11-29 (×6): 2 mg via ORAL
  Filled 2020-11-27 (×5): qty 1

## 2020-11-27 MED ORDER — METHOCARBAMOL 750 MG PO TABS
750.0000 mg | ORAL_TABLET | Freq: Four times a day (QID) | ORAL | Status: DC | PRN
  Administered 2020-11-28: 750 mg via ORAL
  Filled 2020-11-27: qty 1

## 2020-11-27 NOTE — Progress Notes (Signed)
Pharmacy Antibiotic Note  Chelsea Mathis is a 30 y.o. 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  Pharmacy was consulted for vancomycin dosing.  Last vanc trough was 10 on 8/24. Next level planned for 8/31. Next CBC and bmet tomorrow. Pt remains afebrile.   Plan: Continue Vancomycin 1750 mg IV Q 12 hrs (end date 12/19/20 ) Monitor weekly vancomycin levels   Height: 5\' 4"  (162.6 cm) Weight: 85 kg (187 lb 6.3 oz) IBW/kg (Calculated) : 54.7  Temp (24hrs), Avg:98.3 F (36.8 C), Min:98.2 F (36.8 C), Max:98.4 F (36.9 C)  Recent Labs  Lab 11/23/20 0826  VANCOTROUGH 10*     Estimated Creatinine Clearance: 106.5 mL/min (by C-G formula based on SCr of 0.46 mg/dL).    No Known Allergies  Antimicrobials this admission: Vancomycin  8/1 >> (12/19/20) Zosyn 8/1 >> 8/2 Clindamycin  x 1 8/1 Cefepime 8/6 >> 8/9 Mupirocin nasal 8/3 >> 8/9  Microbiology results: 8/1 BCx: 2/2 MRSA 8/1 urine: multiple species. Recollection suggested 8/1 COVID, flu A/B, HIV screen: negative 8/1 MRSA PCR: positive 8/3 HCV Aby negative 8/2 BCx x 2: NG/final 8/5 BCx X 2: 1/4 btls with MRSA 8/6 BCx  X 2: 1/4 btls with MRSA 8/8 Bcx X 2: NG/final  10/8, Pharm.D. PGY-1 Pharmacy Resident [phone] 11/27/2020 12:16 PM   **Pharmacist phone directory can be found on [email or web address] listed under [facility]**

## 2020-11-27 NOTE — Progress Notes (Signed)
[facility]ICINE [facility] REHABILITATION PROGRESS NOTE  Subjective/Complaints:  Having a lot of muscle spasms- A LOT Also thinks doesn't need insulin- refused is yesterday and BG's have been 80s- and 1x max of 121- off insulin- will try off insulin.    ROS:  Pt denies SOB, abd pain, CP, N/V/C/D, and vision changes   Objective: Vital Signs: Blood pressure 127/77, pulse 85, temperature 97.9 F (36.6 C), temperature source Oral, resp. rate 18, height 5\' 4"  (1.626 m), weight 85 kg, last menstrual period 10/31/2020, SpO2 97 %, unknown if currently breastfeeding. No results found. No results for input(s): WBC, HGB, HCT, PLT in the last 72 hours.  No results for input(s): NA, K, CL, CO2, GLUCOSE, BUN, CREATININE, CALCIUM in the last 72 hours.   Intake/Output Summary (Last 24 hours) at 11/27/2020 1539 Last data filed at 11/27/2020 1421 Gross per 24 hour  Intake 118 ml  Output --  Net 118 ml        Physical Exam: BP 127/77 (BP Location: Left Leg)   Pulse 85   Temp 97.9 F (36.6 C) (Oral)   Resp 18   Ht 5\' 4"  (1.626 m)   Wt 85 kg   LMP 10/31/2020   SpO2 97%   BMI 32.17 kg/m     General: awake, alert, appropriate, laying in bed; appears sad; NAD HENT: conjugate gaze; oropharynx moist CV: regular rate; no JVD Pulmonary: CTA B/L; no W/R/R- good air movement GI: soft, NT, ND, (+)BS Psychiatric: appropriate, but sad/depressed affect Neurological: Ox3 Gait: wincing and favoring RLE with each step/using RW with PT.  Musc: Right lower extremity edema.  No tenderness in extremities.tenderness along RIght lateral thigh Left ankle without swelling or ecchymosis, mild TTP over deltoid ligament  Neuro: Alert, speech slowed Motor: Left lower extremity: 4 -/5 proximal to distal (pain inhibition), stable Right lower extremity: Hip flexion, knee extension 2+/5, ankle dorsiflexion 4 -/5 (pain inhibition), stable  Assessment/Plan: 1. Functional deficits which require 3+ hours per  day of interdisciplinary therapy in a comprehensive inpatient rehab setting. Physiatrist is providing close team supervision and 24 hour management of active medical problems listed below. Physiatrist and rehab team continue to assess barriers to discharge/monitor patient progress toward functional and medical goals   Care Tool:  Bathing    Body parts bathed by patient: Right arm, Left arm, Chest, Abdomen, Front perineal area, Right upper leg, Left upper leg, Right lower leg, Left lower leg, Face, Buttocks   Body parts bathed by helper: Buttocks     Bathing assist Assist Level: Contact Guard/Touching assist (using LH sponge)     Upper Body Dressing/Undressing Upper body dressing   What is the patient wearing?: Pull over shirt, Bra    Upper body assist Assist Level: Independent with assistive device    Lower Body Dressing/Undressing Lower body dressing    Lower body dressing activity did not occur: Safety/medical concerns What is the patient wearing?: Underwear/pull up, Pants     Lower body assist Assist for lower body dressing: Independent with assitive device     Toileting Toileting Toileting Activity did not occur (Clothing management and hygiene only): N/A (no void or bm)  Toileting assist Assist for toileting: Moderate Assistance - Patient 50 - 74%     Transfers Chair/bed transfer  Transfers assist     Chair/bed transfer assist level: Supervision/Verbal cueing     Locomotion Ambulation   Ambulation assist   Ambulation activity did not occur: Safety/medical concerns (fatigue, pain, decreased balance/postural  control)  Assist level: Supervision/Verbal cueing Assistive device: Rollator Max distance: 80'   Walk 10 feet activity   Assist  Walk 10 feet activity did not occur: Safety/medical concerns (fatigue, pain, decreased balance/postural control)  Assist level: Supervision/Verbal cueing Assistive device: Rollator   Walk 50 feet  activity   Assist Walk 50 feet with 2 turns activity did not occur: Safety/medical concerns (fatigue, pain, decreased balance/postural control)  Assist level: Supervision/Verbal cueing Assistive device: Rollator    Walk 150 feet activity   Assist Walk 150 feet activity did not occur: Safety/medical concerns (fatigue, pain, decreased balance/postural control)  Assist level: Supervision/Verbal cueing Assistive device: Walker-rolling    Walk 10 feet on uneven surface  activity   Assist Walk 10 feet on uneven surfaces activity did not occur: Safety/medical concerns (fatigue, pain, decreased balance/postural control)   Assist level: Supervision/Verbal cueing Assistive device: Walker-rolling   Wheelchair     Assist Is the patient using a wheelchair?: Yes Type of Wheelchair: Manual Wheelchair activity did not occur: Safety/medical concerns (fatigue, pain, decreased balance/postural control)  Wheelchair assist level: Independent Max wheelchair distance: >67'    Wheelchair 50 feet with 2 turns activity    Assist    Wheelchair 50 feet with 2 turns activity did not occur: Safety/medical concerns (fatigue, pain, decreased balance/postural control)   Assist Level: Independent   Wheelchair 150 feet activity     Assist  Wheelchair 150 feet activity did not occur: Safety/medical concerns (fatigue, pain, decreased balance/postural control)   Assist Level: Independent    Medical Problem List and Plan: 1.   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  Antithrombotics: -DVT/anticoagulation: Venous Doppler studies negative Mechanical:  Antiembolism stockings, knee (TED hose) Bilateral lower extremities Sequential compression devices, below knee Bilateral lower extremities             -antiplatelet therapy: N/A 3. Lower extremity pain:  Robaxin scheduled on 8/14              -kpad for muscular pain in legs  Continue OxyContin started on 8/13  Continue gabapentin 300mg  TID  Continue voltaren gel to right thigh Left ankle pain likely mild liagamentous strain, may ice after therapy and wrap prior to therapy   Add B6 50mg  daily to help with sensitivity to pain  8/27- pain stable but bothersome- con't regimen  8/28- will try to add Zanaflex 2 mg TID and if not enough, then use Robaxin q6 hours prn- switched to prn.  4. Anxiety: continue Atarax 25 mg twice daily as needed anxiety as well as 50 mg nightly as needed insomnia             -antipsychotic agents: N/A 5. Neuropsych: This patient is capable of making decisions on her  own behalf. 6. LUE forearm and RUE dorsum of hand IV infiltrations:  Placed nursing order for warm compress 15 minutes TID. Educated patient regarding benefits of this for pain control. PICC placed 7. Fluids/Electrolytes/Nutrition: Routine in and outs 8.  ID/Bacteremia/MRSA endocarditis.  Follow-up infectious disease..  Continue vancomycin through 12/19/2020 9.    Acute pulmonary emboli felt to be secondary to septic pulmonary emboli .initially on heparin which was discontinued as patient developed anemia.  Lower extremity Dopplers negative.  Pulmonary was involved no recommendations for ongoing anticoagulation at this time 10.  Anemia/leukocytosis: improving, monitor weekly 11.  Hyponatremia.   Sodium 132 on 8/15,   8/28- labs  in AM  Continue to monitor 12.  Poorly controlled diabetes mellitus.  Hemoglobin A1c 12.3.   NovoLog 8 units 3 times daily, d/ced on 8/14 Semglee to 30 units daily on 8/13.  Diabetic teaching             -adjust regimen as needed  Labile on 8/18, monitor for trend, discontinue Ensure CBG (last 3)  Recent Labs    11/26/20 2116 11/27/20 0605 11/27/20 1116  GLUCAP 121* 84 92    8/27- decreased Insulin  to 24 units from 29 units- had 2 episodes in 2 days of 57/59 BG's. Was NOT on insulin at home- will need insulin  teaching before d/c.   8/28- pt had refused long acting insulin and hasn't required short acting insulin in last 24 hours- suggest stopping insulin and restarting? BG's just too low without  insulin to justify continuing it.  13.  Constipation. Continue Miralax BID  8/27- pt insisted change miralax to prn 14. Dry nose: continue scheduled saline nasal spray BID  LOS: 16 days A FACE TO FACE EVALUATION WAS PERFORMED  Chelsea Mathis 11/27/2020, 3:39 PM

## 2020-11-27 NOTE — Plan of Care (Signed)
  Problem: Consults Goal: RH GENERAL PATIENT EDUCATION Description: See Patient Education module for education specifics. Outcome: Progressing Goal: Diabetes Guidelines if Diabetic/Glucose > 140 Description: If diabetic or lab glucose is > 140 mg/dl - Initiate Diabetes/Hyperglycemia Guidelines & Document Interventions  Outcome: Progressing   Problem: RH SAFETY Goal: RH STG DECREASED RISK OF FALL WITH ASSISTANCE Description: STG Decreased Risk of Fall With cues and reminders. Outcome: Progressing   Problem: RH PAIN MANAGEMENT Goal: RH STG PAIN MANAGED AT OR BELOW PT'S PAIN GOAL Description: < 3 on a 0-10 pain scale. Outcome: Progressing   Problem: RH KNOWLEDGE DEFICIT GENERAL Goal: RH STG INCREASE KNOWLEDGE OF SELF CARE AFTER HOSPITALIZATION Description: Patient will demonstrate knowledge of medication management, pain management, safety awareness with educational materials and handouts provided by staff independently at discharge. Outcome: Progressing   

## 2020-11-27 NOTE — Discharge Summary (Signed)
Physician Discharge Summary  Patient ID: Chelsea Mathis MRN: [id number] DOB/AGE: April 15, 1990 30 y.o.  Admit date: 11/11/2020 Discharge date: 11/29/2020  Discharge Diagnoses:  Principal Problem:   Myositis Active Problems:   Acute blood loss anemia   Leukocytosis   Hyponatremia   Acute bacterial endocarditis   Pain of right thigh   Labile blood glucose Anxiety Poorly controlled diabetes mellitus Constipation Acute pulmonary emboli felt to be secondary to septic pulmonary emboli Profound hyponatremia  Discharged Condition: Stable  Significant Diagnostic Studies: CT CHEST WO CONTRAST  Result Date: 10/31/2020 CLINICAL DATA:  Chest pain and shortness of breath. EXAM: CT CHEST WITHOUT CONTRAST TECHNIQUE: Multidetector CT imaging of the chest was performed following the standard protocol without IV contrast. COMPARISON:  10/31/2020 CT abdomen FINDINGS: Cardiovascular: Unremarkable Mediastinum/Nodes: Unremarkable Lungs/Pleura: Scattered cavitary lesions in both lungs are mostly peripheral in location but otherwise random. Some of the scattered pulmonary nodules are solid and not showing signs of cavitation. The cavitary lesions are variable wall thickness, some with relatively thin walls and some with thick walls. Index right middle lobe nodule which is partially cavitary measures 1.3 by 1.4 cm on image 68 series 4. Bilobed cavitary posterior basal segment right lower lobe nodule measures 1.5 by 1.2 cm on image 87 series 4 and has thick walls. Upper Abdomen: Unremarkable Musculoskeletal: Unremarkable IMPRESSION: 1. Bilateral solid and cavitary nodules in the lungs. Appearance favors other septic emboli or atypical infectious process such as tuberculosis or fungal infection. Substantially less likely are noninfective granulomatous process such as Wegener granulomatosis, or cavitary metastatic lesions. Electronically Signed   By: Gaylyn Rong M.D.   On: 10/31/2020 14:42   CT Angio Chest  Pulmonary Embolism (PE) W or WO Contrast  Addendum Date: 11/05/2020   ADDENDUM REPORT: 11/05/2020 23:03 ADDENDUM: Critical Value/emergent results were called by telephone at the time of physician contact on 11/05/2020 at 11:03 pm to provider Dr Camillo Flaming, who verbally acknowledged these results. Electronically Signed   By: Kreg Shropshire M.D.   On: 11/05/2020 23:03   Result Date: 11/05/2020 CLINICAL DATA:  Chest pain, shortness of breath EXAM: CT ANGIOGRAPHY CHEST WITH CONTRAST TECHNIQUE: Multidetector CT imaging of the chest was performed using the standard protocol during bolus administration of intravenous contrast. Multiplanar CT image reconstructions and MIPs were obtained to evaluate the vascular anatomy. CONTRAST:  55mL OMNIPAQUE IOHEXOL 350 MG/ML SOLN COMPARISON:  CT 10/31/2020 FINDINGS: Cardiovascular: Suboptimal contrast opacification of the pulmonary arteries with a central pulmonary artery contrast bolus measuring 186 mean Hounsfield units. Despite the suboptimal opacification, there is demonstrable pulmonary artery embolism seen in the right lower lobar and segmental branches (5/74). No other large central or lobar filling defects are identified within the limitations of this exam. Central pulmonary arteries are top-normal caliber. Elevation of the RV/LV ratio to approximately 1.5. No pericardial effusion. Cardiac size top normal. The aortic root is suboptimally assessed given cardiac pulsation artifact. The aorta is normal caliber. No acute luminal abnormality of the imaged aorta. No periaortic stranding or hemorrhage. Shared origin of the brachiocephalic and left common carotid arteries. Proximal great vessels are otherwise unremarkable. Mediastinum/Nodes: No mediastinal fluid or gas. Normal thyroid gland and thoracic inlet. No acute abnormality of the trachea or esophagus. No worrisome mediastinal, hilar or axillary adenopathy. Lungs/Pleura: Redemonstration and increasing number of numerous regions of  masslike consolidation with some increasing central cavitation throughout both lungs as well as more confluent coalescent consolidation and interspersed regions of ground-glass opacity in the right base. Focal volume loss with  heterogeneous enhancement of the underlying parenchyma in the right lower lobe as well. Small right pleural effusion. Trace left pleural fluid. No pneumothorax. Upper Abdomen: No acute abnormalities present in the visualized portions of the upper abdomen. Musculoskeletal: No acute osseous abnormality or suspicious osseous lesion. Review of the MIP images confirms the above findings. IMPRESSION: 1. Technically limited exam due to respiratory motion artifact and suboptimal contrast opacification. 2. Demonstrable lobar and segmental filling defects seen in the right lower lobe. Elevation of the RV/LV ratio and central pulmonary artery enlargement compatible with some degree of right heart strain. 3. Additional multifocal solid and cavitary nodular opacities throughout the lungs with an appearance and distribution suggestive of septic pulmonary emboli versus atypical infection. 4. Increasing consolidative opacity in the right lung base as well as heterogeneously enhancing atelectatic lung in the right lower lobe. Could reflect a combination of atelectasis, infectious consolidation and pulmonary infarct in the distribution of filling defect described above. Small right pleural effusion and trace left effusion as well. Currently attempting to contact the ordering provider with a critical value result. Addendum will be submitted upon case discussion. Electronically Signed: By: Kreg Shropshire M.D. On: 11/05/2020 22:53   CT ABDOMEN PELVIS W CONTRAST  Addendum Date: 10/31/2020   ADDENDUM REPORT: 10/31/2020 12:21 ADDENDUM: Multifocal cavitary lesions within the right lower lobe, new since 09/23/2020. Consider dedicated CT chest for further evaluation. Findings conveyed to ordering clinician, Dr. Ernie Avena during the time of interpretation of the study. Electronically Signed   By: Roanna Banning MD   On: 10/31/2020 12:21   Result Date: 10/31/2020 CLINICAL DATA:  30 year old female with right lower quadrant abdominal pain. EXAM: CT ABDOMEN AND PELVIS WITH CONTRAST TECHNIQUE: Multidetector CT imaging of the abdomen and pelvis was performed using the standard protocol following bolus administration of intravenous contrast. CONTRAST:  OMNIPAQUE IOHEXOL 300 MG/ML  SOLN COMPARISON:  CT and pelvis, 09/03/2020 and 11/10/2016. FINDINGS: The examination is mildly degraded secondary to motion artifact. Lower chest: Multifocal cavitary lesions within the right lower lobe, new since 09/23/2020 (see key image). Hepatobiliary: No focal liver abnormality is seen. No gallstones, gallbladder wall thickening, or biliary dilatation. Pancreas: Unremarkable. No pancreatic ductal dilatation or surrounding inflammatory changes. Spleen: Normal in size without focal abnormality. Adrenals/Urinary Tract: Adrenal glands are unremarkable. Moderate rotational right renal anomaly. Trace right renal collecting system distention. No discrete hydronephrosis. Moderate distention of urinary bladder. Stomach/Bowel: Stomach is within normal limits. Appendix appears normal. No evidence of bowel wall thickening, distention, or inflammatory changes. Vascular/Lymphatic: No significant vascular findings are present. No enlarged abdominal or pelvic lymph nodes. Other: IUD. Tampon. No abdominal wall hernia or abnormality. No abdominopelvic ascites. Musculoskeletal: No acute or significant osseous findings. IMPRESSION: No acute abdominopelvic process. Electronically Signed: By: Roanna Banning MD On: 10/31/2020 12:02   CT FEMUR RIGHT WO CONTRAST  Result Date: 10/31/2020 CLINICAL DATA:  Soft tissue infection of the thigh EXAM: CT OF THE LOWER RIGHT EXTREMITY WITHOUT CONTRAST TECHNIQUE: Multidetector CT imaging of the right lower extremity was performed  according to the standard protocol. COMPARISON:  10/23/2020 radiographs FINDINGS: Bones/Joint/Cartilage No bony destructive findings characteristic the right femur. Of osteomyelitis involving no large hip joint effusion or substantial knee joint effusion identified. Ligaments Suboptimally assessed by CT. Muscles and Tendons Subtle infiltrative stranding along deep fascia planes without a well-defined drainable fluid collection. No gas tracks in the muscular tissues. Soft tissues Subcutaneous edema noted lateral to the right hip. Low-grade infiltrative edema tracks along the superficial  fascia margin of the thigh laterally. At the level of the knee this is more confluent and tracks down into the lateral and posterior calf region. There is a lesser degree of medial subcutaneous edema distally in the thigh although not substantially asymmetric from the contralateral medial thigh based on inclusion of the contralateral medial thigh. IMPRESSION: 1. Low-level edema tracks along the superficial fascia of the thigh and also there is some mild infiltrative edema along deep fascia planes in the thigh. No drainable abscess, osteomyelitis, or substantial joint effusion. This could be a manifestation of inflammation or third spacing of fluid. No gas in the soft tissues is identified to indicate a aggressive necrotizing infection; careful surveillance in a low threshold for reimaging suggested. Electronically Signed   By: Gaylyn Rong M.D.   On: 10/31/2020 14:34   MR FEMUR RIGHT W WO CONTRAST  Result Date: 11/03/2020 CLINICAL DATA:  Soft tissue infection suspected, thigh, no prior imaging Severe left thigh pain in the setting of MRSA bacteremia EXAM: MR OF THE LEFT LOWER EXTREMITY WITHOUT AND WITH CONTRAST; MRI OF THE RIGHT FEMUR WITHOUT AND WITH CONTRAST TECHNIQUE: Multiplanar, multisequence MR imaging of the left femur was performed both before and after administration of intravenous contrast. Multiplanar,  multisequence MR imaging of the right femur was performed both before and after administration of intravenous contrast. CONTRAST:  10mL GADAVIST GADOBUTROL 1 MMOL/ML IV SOLN COMPARISON:  CT right femur 10/31/2020 FINDINGS: Bones/Joint/Cartilage The cortex is intact. There is no significant marrow signal alteration. No evidence of osteomyelitis. The hip and knee joints are not well visualized due to artifact, but there are partially visualized bilateral knee joint effusions. Ligaments Hip and knee joint ligamentous structures are not well visualized due to artifact. Muscles and Tendons Extensive intramuscular edema throughout the muscles of the thighs, predominantly in the anteromedial compartments and to a lesser degree the posterior compartments. There is perifascial edema and intermuscular fascial edematous thickening bilaterally, right greater than left. In the left thigh, there are multiple intermuscular collections with adjacent fascial enhancement throughout all 3 muscular compartments. There are multiple areas of intramuscular non enhancement with surrounding rim enhancement in the adductors and in the anterior compartment (specifically the vastus intermedius muscle). In the right thigh, there are small areas of ill-defined enhancement within the gracilis muscle, adductor magnus muscle, and biceps femoris muscle (axial postcontrast images 27, 28, and 31). There is no intermuscular fascial enhancement or drainable fluid collection. Soft tissues Subcutaneous soft tissue swelling, right greater than left. IMPRESSION: Left thigh: Myositis and myofasciitis with appearance highly concerning for aggressive soft tissue infectious process. This is evidenced by intermuscular fascial collections and fascial enhancement throughout the muscle compartments. Multiple areas of intramuscular rim enhancement/central non enhancement in the adductors and the vastus intermedius which could reflect small intramuscular abscesses or  possibly diabetic myonecrosis. Right thigh: Myositis and myofasciitis, with overall severity of a much lesser degree than the left side. There are small areas of enhancement within the gracilis muscle, adductor magnus, and biceps femoris, which could be tiny early developing abscesses. No fascial enhancement or evidence of drainable fluid collection at this time. Recommend surgical consultation. These results were called by telephone at the time of interpretation on 11/03/2020 at 4:43 pm to provider RALPH NETTEY , who verbally acknowledged these results. Electronically Signed   By: Caprice Renshaw   On: 11/03/2020 16:49   MR FEMUR LEFT W WO CONTRAST  Result Date: 11/03/2020 CLINICAL DATA:  Soft tissue infection suspected, thigh, no prior  imaging Severe left thigh pain in the setting of MRSA bacteremia EXAM: MR OF THE LEFT LOWER EXTREMITY WITHOUT AND WITH CONTRAST; MRI OF THE RIGHT FEMUR WITHOUT AND WITH CONTRAST TECHNIQUE: Multiplanar, multisequence MR imaging of the left femur was performed both before and after administration of intravenous contrast. Multiplanar, multisequence MR imaging of the right femur was performed both before and after administration of intravenous contrast. CONTRAST:  25mL GADAVIST GADOBUTROL 1 MMOL/ML IV SOLN COMPARISON:  CT right femur 10/31/2020 FINDINGS: Bones/Joint/Cartilage The cortex is intact. There is no significant marrow signal alteration. No evidence of osteomyelitis. The hip and knee joints are not well visualized due to artifact, but there are partially visualized bilateral knee joint effusions. Ligaments Hip and knee joint ligamentous structures are not well visualized due to artifact. Muscles and Tendons Extensive intramuscular edema throughout the muscles of the thighs, predominantly in the anteromedial compartments and to a lesser degree the posterior compartments. There is perifascial edema and intermuscular fascial edematous thickening bilaterally, right greater than left.  In the left thigh, there are multiple intermuscular collections with adjacent fascial enhancement throughout all 3 muscular compartments. There are multiple areas of intramuscular non enhancement with surrounding rim enhancement in the adductors and in the anterior compartment (specifically the vastus intermedius muscle). In the right thigh, there are small areas of ill-defined enhancement within the gracilis muscle, adductor magnus muscle, and biceps femoris muscle (axial postcontrast images 27, 28, and 31). There is no intermuscular fascial enhancement or drainable fluid collection. Soft tissues Subcutaneous soft tissue swelling, right greater than left. IMPRESSION: Left thigh: Myositis and myofasciitis with appearance highly concerning for aggressive soft tissue infectious process. This is evidenced by intermuscular fascial collections and fascial enhancement throughout the muscle compartments. Multiple areas of intramuscular rim enhancement/central non enhancement in the adductors and the vastus intermedius which could reflect small intramuscular abscesses or possibly diabetic myonecrosis. Right thigh: Myositis and myofasciitis, with overall severity of a much lesser degree than the left side. There are small areas of enhancement within the gracilis muscle, adductor magnus, and biceps femoris, which could be tiny early developing abscesses. No fascial enhancement or evidence of drainable fluid collection at this time. Recommend surgical consultation. These results were called by telephone at the time of interpretation on 11/03/2020 at 4:43 pm to provider RALPH NETTEY , who verbally acknowledged these results. Electronically Signed   By: Caprice Renshaw   On: 11/03/2020 16:49   DG CHEST PORT 1 VIEW  Result Date: 11/02/2020 CLINICAL DATA:  Hypoxia EXAM: PORTABLE CHEST 1 VIEW COMPARISON:  10/31/2020 FINDINGS: Borderline to mild cardiomegaly. Worsening airspace disease bilaterally consistent with pneumonia. Possible  small right effusion. No pneumothorax IMPRESSION: Worsening bilateral opacities concerning for pneumonia Electronically Signed   By: Jasmine Pang M.D.   On: 11/02/2020 20:21   DG Chest Port 1 View  Result Date: 10/31/2020 CLINICAL DATA:  Questionable sepsis EXAM: PORTABLE CHEST 1 VIEW COMPARISON:  None. FINDINGS: Heart is normal size. Patchy bilateral airspace opacities. No effusions or acute bony abnormality. IMPRESSION: Patchy bilateral airspace opacities concerning for pneumonia. Electronically Signed   By: Charlett Nose M.D.   On: 10/31/2020 10:53   DG Abd Portable 1V  Result Date: 11/06/2020 CLINICAL DATA:  Abdominal pain and distension. EXAM: PORTABLE ABDOMEN - 1 VIEW COMPARISON:  10/31/2020 CT FINDINGS: Nondistended gas-filled loops of small bowel and colon are noted. No dilated bowel loops are present. No suspicious calcifications are identified. An IUD is noted. No acute bony abnormalities are present. IMPRESSION: Nonspecific  nonobstructive bowel gas pattern. Electronically Signed   By: Harmon Pier M.D.   On: 11/06/2020 11:34   ECHOCARDIOGRAM COMPLETE  Result Date: 11/01/2020    ECHOCARDIOGRAM REPORT   Patient Name:   Chelsea Mathis Date of Exam: 11/01/2020 Medical Rec #:  [id number]      Height:       64.0 in Accession #:    [id number]     Weight:       206.8 lb Date of Birth:  11-01-1990       BSA:          1.984 m Patient Age:    30 years       BP:           115/69 mmHg Patient Gender: F              HR:           135 bpm. Exam Location:  Inpatient Procedure: 2D Echo, Color Doppler and Cardiac Doppler Indications:    Bacteremia  History:        Patient has no prior history of Echocardiogram examinations.                 Risk Factors:Diabetes.  Sonographer:    Irving Burton Senior RDCS Referring Phys: [id number] Jeanella Craze  Sonographer Comments: Echo performed with high heart rate to rule out structural cause, has been elevated since admission. IMPRESSIONS  1. Left ventricular ejection fraction, by  estimation, is 70 to 75%. The left ventricle has hyperdynamic function. The left ventricle has no regional wall motion abnormalities. Indeterminate diastolic filling due to E-A fusion.  2. Right ventricular systolic function is normal. The right ventricular size is normal.  3. The mitral valve is normal in structure. No evidence of mitral valve regurgitation. No evidence of mitral stenosis.  4. The aortic valve is normal in structure. Aortic valve regurgitation is not visualized. No aortic stenosis is present.  5. The inferior vena cava is normal in size with greater than 50% respiratory variability, suggesting right atrial pressure of 3 mmHg. Conclusion(s)/Recommendation(s): No evidence of valvular vegetations on this transthoracic echocardiogram. Would recommend a transesophageal echocardiogram to exclude infective endocarditis if clinically indicated. FINDINGS  Left Ventricle: Left ventricular ejection fraction, by estimation, is 70 to 75%. The left ventricle has hyperdynamic function. The left ventricle has no regional wall motion abnormalities. The left ventricular internal cavity size was normal in size. There is no left ventricular hypertrophy. Indeterminate diastolic filling due to E-A fusion. Right Ventricle: The right ventricular size is normal. No increase in right ventricular wall thickness. Right ventricular systolic function is normal. Left Atrium: Left atrial size was normal in size. Right Atrium: Right atrial size was normal in size. Pericardium: There is no evidence of pericardial effusion. Mitral Valve: The mitral valve is normal in structure. No evidence of mitral valve regurgitation. No evidence of mitral valve stenosis. Tricuspid Valve: The tricuspid valve is normal in structure. Tricuspid valve regurgitation is not demonstrated. No evidence of tricuspid stenosis. Aortic Valve: The aortic valve is normal in structure. Aortic valve regurgitation is not visualized. No aortic stenosis is present.  Pulmonic Valve: The pulmonic valve was normal in structure. Pulmonic valve regurgitation is not visualized. No evidence of pulmonic stenosis. Aorta: The aortic root is normal in size and structure. Venous: The inferior vena cava is normal in size with greater than 50% respiratory variability, suggesting right atrial pressure of 3 mmHg. IAS/Shunts: No atrial level shunt detected by color  flow Doppler.  LEFT VENTRICLE PLAX 2D LVIDd:         3.40 cm LVIDs:         2.00 cm LV PW:         1.00 cm LV IVS:        1.00 cm LVOT diam:     2.00 cm LV SV:         44 LV SV Index:   22 LVOT Area:     3.14 cm  RIGHT VENTRICLE RV S prime:     15.70 cm/s TAPSE (M-mode): 1.8 cm LEFT ATRIUM           Index LA diam:      2.60 cm 1.31 cm/m LA Vol (A4C): 32.1 ml 16.18 ml/m  AORTIC VALVE LVOT Vmax:   94.20 cm/s LVOT Vmean:  70.900 cm/s LVOT VTI:    0.140 m  AORTA Ao Root diam: 3.40 cm Ao Asc diam:  2.90 cm  SHUNTS Systemic VTI:  0.14 m Systemic Diam: 2.00 cm Thurmon Fair MD Electronically signed by Thurmon Fair MD Signature Date/Time: 11/01/2020/10:22:04 AM    Final    ECHO TEE  Result Date: 11/11/2020    TRANSESOPHOGEAL ECHO REPORT   Patient Name:   Chelsea Mathis Date of Exam: 11/03/2020 Medical Rec #:  [id number]      Height:       64.0 in Accession #:    [id number]     Weight:       216.0 lb Date of Birth:  1990-11-19       BSA:          2.022 m Patient Age:    30 years       BP:           124/54 mmHg Patient Gender: F              HR:           108 bpm. Exam Location:  Inpatient Procedure: 2D Echo, Cardiac Doppler, Color Doppler, Transesophageal Echo and 3D            Echo Indications:     Bacteremia  History:         Patient has prior history of Echocardiogram examinations, most                  recent 11/01/2020.  Sonographer:     Roosvelt Maser RDCS Referring Phys:  [id number] Roe Rutherford [facility] Diagnosing Phys: Jodelle Red MD PROCEDURE: After discussion of the risks and benefits of a TEE, an informed consent was obtained  from the patient. The transesophogeal probe was passed without difficulty through the esophogus of the patient. Local oropharyngeal anesthetic was provided with Cetacaine. Sedation performed by different physician. Image quality was good. The patient's vital signs; including heart rate, blood pressure, and oxygen saturation; remained stable throughout the procedure. The patient developed no complications during the procedure. IMPRESSIONS  1. Left ventricular ejection fraction, by estimation, is 70 to 75%. The left ventricle has hyperdynamic function. The left ventricle has no regional wall motion abnormalities.  2. Right ventricular systolic function is normal. The right ventricular size is normal.  3. No left atrial/left atrial appendage thrombus was detected.  4. The mitral valve is normal in structure. No evidence of mitral valve regurgitation. No evidence of mitral stenosis.  5. Abnormal valve suggestive of endocarditis. There is no discrete mass seen attached to the valve, but the valve is overall thickened, and there are  thickened/echogenic areas of the subvalvular apparatus. The tricuspid valve is abnormal.  6. The aortic valve is tricuspid. Aortic valve regurgitation is not visualized. No aortic stenosis is present. Conclusion(s)/Recommendation(s): Suggestive of tricuspid valve endocarditis. While no discrete echodensity is seen on the atrial surface of the valve, the overall thickening and appearance of the valve and subvalvular apparatus supports infection. Cannot  exclude that tricuspid valve had vegetation at one time resulting in septic emboli. Findings communicated with primary and ID teams. FINDINGS  Left Ventricle: Left ventricular ejection fraction, by estimation, is 70 to 75%. The left ventricle has hyperdynamic function. The left ventricle has no regional wall motion abnormalities. The left ventricular internal cavity size was normal in size. Right Ventricle: The right ventricular size is normal.  No increase in right ventricular wall thickness. Right ventricular systolic function is normal. Left Atrium: Left atrial size was normal in size. No left atrial/left atrial appendage thrombus was detected. Right Atrium: Right atrial size was normal in size. Pericardium: Trivial pericardial effusion is present. Mitral Valve: The mitral valve is normal in structure. No evidence of mitral valve regurgitation. No evidence of mitral valve stenosis. There is no evidence of mitral valve vegetation. Tricuspid Valve: Abnormal valve suggestive of endocarditis. There is no discrete mass seen attached to the valve, but the valve is overall thickened, and there are thickened/echogenic areas of the subvalvular apparatus. The tricuspid valve is abnormal. Tricuspid valve regurgitation is trivial. No evidence of tricuspid stenosis. Aortic Valve: The aortic valve is tricuspid. Aortic valve regurgitation is not visualized. No aortic stenosis is present. There is no evidence of aortic valve vegetation. Pulmonic Valve: The pulmonic valve was normal in structure. Pulmonic valve regurgitation is trivial. No evidence of pulmonic stenosis. Aorta: The aortic root is normal in size and structure. IAS/Shunts: No atrial level shunt detected by color flow Doppler. Jodelle Red MD Electronically signed by Jodelle Red MD Signature Date/Time: 11/11/2020/2:37:26 PM    Final    VAS [place] LOWER EXTREMITY VENOUS (DVT)  Result Date: 11/06/2020  Lower Venous DVT Study Patient Name:  Chelsea Mathis  Date of Exam:   11/06/2020 Medical Rec #: [id number]       Accession #:    [id number] Date of Birth: 12-08-1990        Patient Gender: F Patient Age:   79 years Exam Location[facility]  [facility] Procedure:      VAS [place] LOWER EXTREMITY VENOUS (DVT) Referring Phys: RALPH NETTEY --------------------------------------------------------------------------------  Indications: Septic emboli, pulmonary embolism, and Swelling.  Risk Factors: MRSA,  bacteremia. Limitations: Swelling, depth of vessels. Comparison Study: Prior negative right LEV done 10/23/20 Performing Technologist: Sherren Kerns RVS  Examination Guidelines: A complete evaluation includes B-mode imaging, spectral Doppler, color Doppler, and power Doppler as needed of all accessible portions of each vessel. Bilateral testing is considered an integral part of a complete examination. Limited examinations for reoccurring indications may be performed as noted. The reflux portion of the exam is performed with the patient in reverse Trendelenburg.  +---------+---------------+---------+-----------+----------+--------------+ RIGHT    CompressibilityPhasicitySpontaneityPropertiesThrombus Aging +---------+---------------+---------+-----------+----------+--------------+ CFV      Full           Yes      Yes                                 +---------+---------------+---------+-----------+----------+--------------+ SFJ      Full                                                        +---------+---------------+---------+-----------+----------+--------------+  FV Prox  Full                                                        +---------+---------------+---------+-----------+----------+--------------+ FV Mid   Full                                                        +---------+---------------+---------+-----------+----------+--------------+ FV DistalFull                                                        +---------+---------------+---------+-----------+----------+--------------+ PFV      Full                                                        +---------+---------------+---------+-----------+----------+--------------+ POP      Full           Yes      Yes                                 +---------+---------------+---------+-----------+----------+--------------+ PTV      Full                                                         +---------+---------------+---------+-----------+----------+--------------+ PERO     Full                                                        +---------+---------------+---------+-----------+----------+--------------+ Soleal   Full                                                        +---------+---------------+---------+-----------+----------+--------------+   +---------+---------------+---------+-----------+----------+--------------+ LEFT     CompressibilityPhasicitySpontaneityPropertiesThrombus Aging +---------+---------------+---------+-----------+----------+--------------+ CFV      Full           Yes      Yes                                 +---------+---------------+---------+-----------+----------+--------------+ SFJ      Full                                                        +---------+---------------+---------+-----------+----------+--------------+  FV Prox  Full                                                        +---------+---------------+---------+-----------+----------+--------------+ FV Mid   Full                                                        +---------+---------------+---------+-----------+----------+--------------+ FV DistalFull                                                        +---------+---------------+---------+-----------+----------+--------------+ PFV      Full                                                        +---------+---------------+---------+-----------+----------+--------------+ POP      Full           Yes      Yes                                 +---------+---------------+---------+-----------+----------+--------------+ PTV      Full                                                        +---------+---------------+---------+-----------+----------+--------------+ PERO     Full                                                         +---------+---------------+---------+-----------+----------+--------------+     Summary: BILATERAL: - No evidence of deep vein thrombosis seen in the lower extremities, bilaterally. -No evidence of popliteal cyst, bilaterally.   *See table(s) above for measurements and observations. Electronically signed by Heath Lark on 11/06/2020 at 2:06:07 PM.    Final    VAS [place] UPPER EXTREMITY VENOUS DUPLEX  Result Date: 11/09/2020 UPPER VENOUS STUDY  Patient Name:  Chelsea Mathis  Date of Exam:   11/09/2020 Medical Rec #: [id number]       Accession #:    [id number] Date of Birth: [birth year]        Patient Gender: F Patient Age:   36 years Exam Location:  [facility] Procedure:      VAS [place] UPPER EXTREMITY VENOUS DUPLEX Referring Phys: Rebekah Chesterfield POKHREL --------------------------------------------------------------------------------  Indications: Pain, and Swelling Comparison Study: Previous exam 11/06/20 - negative Performing Technologist: Ernestene Mention RVT, RDMS  Examination Guidelines: A complete evaluation includes B-mode imaging, spectral Doppler, color Doppler, and power Doppler as needed of all accessible  portions of each vessel. Bilateral testing is considered an integral part of a complete examination. Limited examinations for reoccurring indications may be performed as noted.  Right Findings: +----------+------------+---------+-----------+----------+-------+ RIGHT     CompressiblePhasicitySpontaneousPropertiesSummary +----------+------------+---------+-----------+----------+-------+ Subclavian    Full       Yes       Yes                      +----------+------------+---------+-----------+----------+-------+  Left Findings: +----------+------------+---------+-----------+----------+--------------------+ LEFT      CompressiblePhasicitySpontaneousProperties      Summary        +----------+------------+---------+-----------+----------+--------------------+ IJV           Full       Yes       Yes                                    +----------+------------+---------+-----------+----------+--------------------+ Subclavian    Full       Yes       Yes                                   +----------+------------+---------+-----------+----------+--------------------+ Axillary      Full       Yes       Yes                                   +----------+------------+---------+-----------+----------+--------------------+ Brachial      Full       Yes       Yes                                   +----------+------------+---------+-----------+----------+--------------------+ Radial        Full                                                       +----------+------------+---------+-----------+----------+--------------------+ Ulnar         Full                                                       +----------+------------+---------+-----------+----------+--------------------+ Cephalic      None       No        No                 Acute - prox-mid                                                             forearm        +----------+------------+---------+-----------+----------+--------------------+ Basilic       Full       Yes       Yes                                   +----------+------------+---------+-----------+----------+--------------------+  Summary:  Right: No evidence of thrombosis in the subclavian.  Left: No evidence of deep vein thrombosis in the upper extremity. Findings consistent with acute superficial vein thrombosis involving the left cephalic vein.  *See table(s) above for measurements and observations.  Diagnosing physician: Coral Else MD Electronically signed by Coral Else MD on 11/09/2020 at 6:54:18 PM.    Final    VAS [place] UPPER EXTREMITY VENOUS DUPLEX  Result Date: 11/06/2020 UPPER VENOUS STUDY  Patient Name:  Chelsea Mathis  Date of Exam:   11/06/2020 Medical Rec #: [id number]       Accession #:    [id number] Date of Birth: 03-29-1991        Patient  Gender: F Patient Age:   36 years Exam Location:  [facility] Procedure:      VAS [place] UPPER EXTREMITY VENOUS DUPLEX Referring Phys: RALPH NETTEY --------------------------------------------------------------------------------  Indications: septic emboli, and pulmonary embolism Risk Factors: MRSA, endocarditis. Limitations: Depth of vessels and poor ultrasound/tissue interface. Comparison Study: No prior study Performing Technologist: Sherren Kerns RVS  Examination Guidelines: A complete evaluation includes B-mode imaging, spectral Doppler, color Doppler, and power Doppler as needed of all accessible portions of each vessel. Bilateral testing is considered an integral part of a complete examination. Limited examinations for reoccurring indications may be performed as noted.  Right Findings: +----------+------------+---------+-----------+----------+--------------+ RIGHT     CompressiblePhasicitySpontaneousProperties   Summary     +----------+------------+---------+-----------+----------+--------------+ IJV           Full       Yes       Yes                             +----------+------------+---------+-----------+----------+--------------+ Subclavian               Yes       Yes                             +----------+------------+---------+-----------+----------+--------------+ Axillary                 Yes       Yes                             +----------+------------+---------+-----------+----------+--------------+ Brachial      Full       Yes       Yes                             +----------+------------+---------+-----------+----------+--------------+ Radial        Full       Yes       Yes                             +----------+------------+---------+-----------+----------+--------------+ Ulnar                                               Not visualized +----------+------------+---------+-----------+----------+--------------+ Cephalic      Full                                                  +----------+------------+---------+-----------+----------+--------------+  Basilic       Full                                                 +----------+------------+---------+-----------+----------+--------------+  Left Findings: +----------+------------+---------+-----------+----------+---------------------+ LEFT      CompressiblePhasicitySpontaneousProperties       Summary        +----------+------------+---------+-----------+----------+---------------------+ IJV           Full       Yes       Yes                                    +----------+------------+---------+-----------+----------+---------------------+ Subclavian               Yes       Yes                                    +----------+------------+---------+-----------+----------+---------------------+ Axillary                 Yes       Yes                                    +----------+------------+---------+-----------+----------+---------------------+ Brachial      Full       Yes       Yes                                    +----------+------------+---------+-----------+----------+---------------------+ Radial        Full       Yes       Yes                                    +----------+------------+---------+-----------+----------+---------------------+ Ulnar                    Yes       Yes               patent by color and                                                             Doppler        +----------+------------+---------+-----------+----------+---------------------+ Cephalic      Full                                                        +----------+------------+---------+-----------+----------+---------------------+ Basilic       Full                                                        +----------+------------+---------+-----------+----------+---------------------+  Summary:  Right: No evidence of deep vein  thrombosis in the upper extremity. No evidence of superficial vein thrombosis in the upper extremity.  Left: No evidence of deep vein thrombosis in the upper extremity. No evidence of superficial vein thrombosis in the upper extremity.  *See table(s) above for measurements and observations.  Diagnosing physician: Heath Lark Electronically signed by Heath Lark on 11/06/2020 at 2:06:26 PM.    Final    ECHOCARDIOGRAM LIMITED  Result Date: 11/07/2020    ECHOCARDIOGRAM LIMITED REPORT   Patient Name:   Chelsea Mathis Date of Exam: 11/07/2020 Medical Rec #:  [id number]      Height:       64.0 in Accession #:    [id number]     Weight:       224.2 lb Date of Birth:  11-30-1990       BSA:          2.054 m Patient Age:    30 years       BP:           137/4 mmHg Patient Gender: F              HR:           120 bpm. Exam Location:  Inpatient Procedure: Limited Color Doppler, Limited Echo, Cardiac Doppler and 2D Echo Indications:    I26.02 Pulmonary embolus  History:        Patient has prior history of Echocardiogram examinations, most                 recent 08/03/2020. CT chest 11/05/20 which showed PE and RV/LV ratio                 1.5, Signs/Symptoms:Shortness of Breath and Chest Pain; Risk                 Factors:Diabetes. H/O Covid 98 in June. After a short recovery                 became very ill. Acute pulmonary embolism and past pulmonary                 embolism this hospitalization.  Sonographer:    Roosvelt Maser RDCS Referring Phys: [phone] RALPH A NETTEY IMPRESSIONS  1. Left ventricular ejection fraction, by estimation, is >75%. The left ventricle has hyperdynamic function. The left ventricle has no regional wall motion abnormalities.  2. Right ventricular systolic function is mildly reduced. The right ventricular size is normal. There is moderately elevated pulmonary artery systolic pressure.  3. The mitral valve is normal in structure. No evidence of mitral valve regurgitation. No evidence of mitral stenosis.  4. The  aortic valve is tricuspid. Aortic valve regurgitation is not visualized. No aortic stenosis is present.  5. The inferior vena cava is normal in size with greater than 50% respiratory variability, suggesting right atrial pressure of 3 mmHg. FINDINGS  Left Ventricle: Left ventricular ejection fraction, by estimation, is >75%. The left ventricle has hyperdynamic function. The left ventricle has no regional wall motion abnormalities. The left ventricular internal cavity size was normal in size. There is no left ventricular hypertrophy. Right Ventricle: The right ventricular size is normal.Right ventricular systolic function is mildly reduced. There is moderately elevated pulmonary artery systolic pressure. The tricuspid regurgitant velocity is 3.35 m/s, and with an assumed right atrial  pressure of 3 mmHg, the estimated right ventricular systolic pressure is 47.9 mmHg. Left Atrium: Left atrial size was normal  in size. Right Atrium: Right atrial size was normal in size. Pericardium: There is no evidence of pericardial effusion. Mitral Valve: The mitral valve is normal in structure. No evidence of mitral valve stenosis. Tricuspid Valve: The tricuspid valve is normal in structure. Tricuspid valve regurgitation is mild . No evidence of tricuspid stenosis. Aortic Valve: The aortic valve is tricuspid. Aortic valve regurgitation is not visualized. No aortic stenosis is present. Pulmonic Valve: The pulmonic valve was normal in structure. Pulmonic valve regurgitation is trivial. No evidence of pulmonic stenosis. Aorta: The aortic root is normal in size and structure. Venous: The inferior vena cava is normal in size with greater than 50% respiratory variability, suggesting right atrial pressure of 3 mmHg. IAS/Shunts: The interatrial septum was not well visualized. RIGHT VENTRICLE RV Basal diam:  3.20 cm TRICUSPID VALVE TR Peak grad:   44.9 mmHg TR Vmax:        335.00 cm/s Olga Millers MD Electronically signed by Olga Millers  MD Signature Date/Time: 11/07/2020/2:41:34 PM    Final    [place] EKG SITE RITE  Result Date: 11/18/2020 If Site Rite image not attached, placement could not be confirmed due to current cardiac rhythm.   Labs:  Basic Metabolic Panel: Recent Labs  Lab 11/28/20 0315  NA 134*  K 3.5  CL 102  CO2 25  GLUCOSE 121*  BUN <5*  CREATININE 0.30*  CALCIUM 8.7*    CBC: Recent Labs  Lab 11/28/20 0315  WBC 10.2  NEUTROABS 7.1  HGB 8.1*  HCT 27.3*  MCV 84.5  PLT 616*    CBG: Recent Labs  Lab 11/27/20 2113 11/28/20 0617 11/28/20 1149 11/28/20 1630 11/28/20 2110  GLUCAP 143* 130* 146* 135* 166*   Family history.  Mother with diabetes and hypertension.  Brother with diabetes as well as maternal uncle.  Denies any colon cancer esophageal cancer or rectal cancer  Brief HPI:   Keryn Deroy is a 30 y.o. right-handed female with history of diabetes mellitus.  Lives with significant other.  Independent prior to admission.  Presented 10/31/2020 after patient reportedly was lifting a box much heavier than she thought and noting right groin pain that increased and wraparound to her hip and low back.  She was seen by urgent care 10/01/2020 for UTI given nitrofurantoin for 5 days.  On 7/17 she was seen again at urgent care felt to have UTI with hematuria and a muscle spasm to her hip.  She is given Toradol injection and a prescription for Augmentin x5 days.  On 7/18 her vaginal panel came back positive for bacterial vaginal infection was given a prescription for Flagyl.  She again returned to the ER 7/24 with right thigh pain of unclear etiology.  X-rays reportedly were completed negative as well as right lower extremity venous Doppler studies negative.  She was given a short course of pain medication Norco recommended follow-up with sports medicine.  She returned to the ER again 8/1 with reports of elevated blood sugars swelling to her hands and lower extremities and generalized pain.  She had been followed  at home prior to this latest arrival and reported generalized weakness.  CT of the right femur and abdomen/pelvis assessed showing low level edema tracking along the superficial fascia of the thigh and some mild infiltrative edema along the deep fascial planes in the thigh.  CT of abdomen pelvis revealed multiple cavitary lesions within the right lower lobe.  Labs noted severe hyponatremia 103 potassium 4.5 chloride 75 glucose 559 BUN  34 alkaline phosphatase 203 indirect bilirubin 3.7 WBC 24,900 lactic acid 4.6.  She was placed on intravenous fluid and insulin drip in the ER.  Cultures returned showing staph aureus bacteremia/sepsis.  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  Repeat blood cultures 11/08/18/2022 negative.  Placed on vancomycin through 12/19/2020.  Her acute pulmonary emboli felt to be secondary to septic pulmonary emboli initially placed on heparin later discontinued as she developed anemia and noted lower extremity Dopplers negative pulmonary service was involved no recommendations for ongoing anticoagulation at this time.  Acute on chronic anemia transfuse 1 unit packed red blood cells latest hemoglobin 7.5 leukocytosis improved 19,400.  She did develop paroxysmal atrial fibrillation requiring amiodarone cardiac rate controlled amiodarone later discontinued.  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  Her initial CK was 586 improved to 177.  Psychiatry consulted for mixed anxiety depression during hospital stay likely secondary to acute illness Atarax was added for anxiety.  Therapy evaluations completed due to patient decreased functional mobility was admitted for a comprehensive rehab program.   Hospital Course:  Carnell Zuver was admitted to rehab 11/11/2020 for inpatient therapies to consist of PT, ST and OT at least three hours five days a week. Past admission physiatrist, therapy team and rehab RN have worked together to provide customized collaborative inpatient rehab.  Pertain to patient's debility secondary to severe sepsis bacteremia due to staph aureus as well as weakness from pain myositis.  Follow-up infectious disease continued on vancomycin through 12/19/2020.  In regards to patient's acute pulmonary emboli felt to be secondary to septic pulmonary emboli initially on heparin discontinued as patient developed anemia lower extremity Dopplers negative no further anticoagulation recommended per critical care.  Pain managed use of Neurontin 300 mg 3 times daily scheduled OxyContin 50 mg every 12 hours and oxycodone for breakthrough pain.  Noted anxiety psychiatry follow-up Atarax as needed for anxiety she was using some trazodone for sleep.  Her hyponatremia continued to improve latest sodium 132.  Poorly controlled diabetes mellitus hemoglobin A1c 12.3 initially on NovoLog sliding scale and later Semglee scheduled and due to low readings Semglee discontinued as well NovoLog consideration made for oral agent however with her blood sugars being so well controlled while in the hospital was recommended to continue diet and stressed the need to follow with PCP for her diabetes mellitus..  Bouts of constipation resolved with laxative assistance.   Blood pressures were monitored on TID basis and controlled  Diabetes has been monitored with ac/hs CBG checks and SSI was use prn for tighter BS control.    Rehab course: During patient's stay in rehab weekly team conferences were held to monitor patient's progress, set goals and discuss barriers to discharge. At admission, patient required minimal assist 3 feet rolling walker moderate assist sit to stand total assist toilet transfer  Physical exam.  Blood pressure  115/65 pulse 112 temperature 99.8 respiration 20 oxygen saturations 99% room air Constitutional.  No acute distress HEENT Head.  Normocephalic and atraumatic Eyes.  Pupils round and reactive to light no discharge without nystagmus Neck.  Supple nontender no JVD without thyromegaly Cardiac regular rate rhythm not extra sounds or murmur heard Abdomen.  Soft nontender positive bowel sounds without rebound Respiratory effort normal no respiratory distress without wheeze Neurologic.  Alert mood  is a bit flat but appropriate oriented x3 reasonable insight and awareness.  No focal sensory deficits.  Upper extremities 3+/5 proximal to 4/5 distal.  Lower extremities 3/5 hip flexors and 4/5 ankle dorsi plantarflexion.  DTRs 1+  He/She  has had improvement in activity tolerance, balance, postural control as well as ability to compensate for deficits. He/She has had improvement in functional use RUE/LUE  and RLE/LLE as well as improvement in awareness.  Patient cooperative and pleasant with therapies.  Able to generally mobilize on a supervision basis with use of bed features and rolling walker.  Ambulates 80 feet rest breaks as needed sit to stand supervision.  Educated on doffing pants off feet and seated for safety.  Full family teaching completed plan discharged home       Disposition: Discharged home    Diet: Diabetic diet  Special Instructions: No driving smoking or alcohol  Follow-up with PCP on medical management of her diabetes mellitus.  Medications at discharge 1.  Tylenol as needed 2.  Voltaren gel 2 g 4 times daily 3.  Neurontin 300 mg p.o. 3 times daily 4.  Atarax 25 mg twice daily as needed anxiety 5.  Semglee 24 units daily 6.  Robaxin 750 mg p.o. 6 times prn  7.  Multivitamin daily 8.  OxyContin CR 10 mg every 12 hours 9.  Oxycodone 1 to 2 tablets every 4 hours as needed pain 10.  MiraLAX as needed 11.  Inderal 10 mg p.o. daily 12.  Vitamin B6 50 mg daily 13.  Senokot S1  tab p.o. twice daily hold for loose stools 14.  Vancomycin 1750 mg every 12 hours until 12/19/2020 and stop. 15.Zanaflex  TID    30-35 minutes were spent completing discharge summary and discharge planning Discharge Instructions     Ambulatory referral to Physical Medicine Rehab   Complete by: As directed    Moderate complexity follow-up 1 to 2 weeks myositis        Follow-up Information     Odette Fraction, MD Follow up.   Specialty: Infectious Diseases Why: call for appointment Contact information: [address] [place] [address] [phone]         Roby Lofts, MD Follow up.   Specialty: Orthopedic Surgery Why: as needed Contact information: [address] [place] [address] [phone]         Horton Chin, MD Follow up.   Specialty: Physical Medicine and Rehabilitation Why: 01/20/21 please arrive at 9:00am for 9:20am appointment, thank you! Contact information: [address] [place] [address] [phone]                 Signed: Charlton Amor 11/29/2020, 5:15 AM

## 2020-11-28 ENCOUNTER — Other Ambulatory Visit (HOSPITAL_COMMUNITY): Payer: Self-pay

## 2020-11-28 LAB — CBC WITH DIFFERENTIAL/PLATELET
Abs Immature Granulocytes: 0.05 10*3/uL (ref 0.00–0.07)
Basophils Absolute: 0 10*3/uL (ref 0.0–0.1)
Basophils Relative: 0 %
Eosinophils Absolute: 0.1 10*3/uL (ref 0.0–0.5)
Eosinophils Relative: 1 %
HCT: 27.3 % — ABNORMAL LOW (ref 36.0–46.0)
Hemoglobin: 8.1 g/dL — ABNORMAL LOW (ref 12.0–15.0)
Immature Granulocytes: 1 %
Lymphocytes Relative: 21 %
Lymphs Abs: 2.1 10*3/uL (ref 0.7–4.0)
MCH: 25.1 pg — ABNORMAL LOW (ref 26.0–34.0)
MCHC: 29.7 g/dL — ABNORMAL LOW (ref 30.0–36.0)
MCV: 84.5 fL (ref 80.0–100.0)
Monocytes Absolute: 0.7 10*3/uL (ref 0.1–1.0)
Monocytes Relative: 7 %
Neutro Abs: 7.1 10*3/uL (ref 1.7–7.7)
Neutrophils Relative %: 70 %
Platelets: 616 10*3/uL — ABNORMAL HIGH (ref 150–400)
RBC: 3.23 MIL/uL — ABNORMAL LOW (ref 3.87–5.11)
RDW: 16.1 % — ABNORMAL HIGH (ref 11.5–15.5)
WBC: 10.2 10*3/uL (ref 4.0–10.5)
nRBC: 0 % (ref 0.0–0.2)

## 2020-11-28 LAB — COMPREHENSIVE METABOLIC PANEL
ALT: 10 U/L (ref 0–44)
AST: 17 U/L (ref 15–41)
Albumin: 2 g/dL — ABNORMAL LOW (ref 3.5–5.0)
Alkaline Phosphatase: 95 U/L (ref 38–126)
Anion gap: 7 (ref 5–15)
BUN: <5 mg/dL — ABNORMAL LOW (ref 6–20)
CO2: 25 mmol/L (ref 22–32)
Calcium: 8.7 mg/dL — ABNORMAL LOW (ref 8.9–10.3)
Chloride: 102 mmol/L (ref 98–111)
Creatinine, Ser: 0.3 mg/dL — ABNORMAL LOW (ref 0.44–1.00)
GFR, Estimated: >60 mL/min (ref >60)
Glucose, Bld: 121 mg/dL — ABNORMAL HIGH (ref 70–99)
Potassium: 3.5 mmol/L (ref 3.5–5.1)
Sodium: 134 mmol/L — ABNORMAL LOW (ref 135–145)
Total Bilirubin: 0.7 mg/dL (ref 0.3–1.2)
Total Protein: 7.7 g/dL (ref 6.5–8.1)

## 2020-11-28 LAB — VANCOMYCIN, TROUGH: Vancomycin Tr: 9 ug/mL — ABNORMAL LOW (ref 15–20)

## 2020-11-28 LAB — GLUCOSE, CAPILLARY
Glucose-Capillary: 130 mg/dL — ABNORMAL HIGH (ref 70–99)
Glucose-Capillary: 146 mg/dL — ABNORMAL HIGH (ref 70–99)
Glucose-Capillary: 135 mg/dL — ABNORMAL HIGH (ref 70–99)
Glucose-Capillary: 166 mg/dL — ABNORMAL HIGH (ref 70–99)

## 2020-11-28 LAB — VANCOMYCIN, PEAK: Vancomycin Pk: 35 ug/mL (ref 30–40)

## 2020-11-28 MED ORDER — POLYETHYLENE GLYCOL 3350 17 G PO PACK: 17.0000 g | PACK | Freq: Every day | ORAL | 0 refills | Status: DC | PRN

## 2020-11-28 MED ORDER — TIZANIDINE HCL 2 MG PO TABS
2.0000 mg | ORAL_TABLET | Freq: Three times a day (TID) | ORAL | 0 refills | Status: AC
  Filled 2020-11-28: qty 90, 30d supply, fill #0

## 2020-11-28 MED ORDER — HYDROXYZINE HCL 25 MG PO TABS
25.0000 mg | ORAL_TABLET | Freq: Two times a day (BID) | ORAL | 0 refills | Status: AC | PRN
  Filled 2020-11-28: qty 30, 15d supply, fill #0

## 2020-11-28 MED ORDER — OXYCODONE-ACETAMINOPHEN 5-325 MG PO TABS
1.0000 | ORAL_TABLET | ORAL | 0 refills | Status: DC | PRN
  Filled 2020-11-28: qty 30, 5d supply, fill #0

## 2020-11-28 MED ORDER — METHOCARBAMOL 750 MG PO TABS
750.0000 mg | ORAL_TABLET | Freq: Four times a day (QID) | ORAL | 0 refills | Status: AC | PRN
  Filled 2020-11-28: qty 60, 15d supply, fill #0

## 2020-11-28 MED ORDER — GABAPENTIN 300 MG PO CAPS
300.0000 mg | ORAL_CAPSULE | Freq: Three times a day (TID) | ORAL | 0 refills | Status: AC
  Filled 2020-11-28: qty 90, 30d supply, fill #0

## 2020-11-28 MED ORDER — ACETAMINOPHEN 325 MG PO TABS: 650.0000 mg | ORAL_TABLET | Freq: Four times a day (QID) | ORAL | Status: AC | PRN

## 2020-11-28 MED ORDER — INSULIN GLARGINE-YFGN 100 UNIT/ML ~~LOC~~ SOLN
30.0000 [IU] | Freq: Every day | SUBCUTANEOUS | Status: DC
  Filled 2020-11-28 (×2): qty 0.3

## 2020-11-28 MED ORDER — DICLOFENAC SODIUM 1 % EX GEL
2.0000 g | Freq: Four times a day (QID) | CUTANEOUS | 0 refills | Status: AC
  Filled 2020-11-28: qty 100, 7d supply, fill #0

## 2020-11-28 MED ORDER — PROPRANOLOL HCL 10 MG PO TABS
10.0000 mg | ORAL_TABLET | Freq: Every day | ORAL | 0 refills | Status: AC
  Filled 2020-11-28: qty 30, 30d supply, fill #0

## 2020-11-28 MED ORDER — INSULIN GLARGINE-YFGN 100 UNIT/ML ~~LOC~~ SOLN: 30.0000 [IU] | Freq: Every day | SUBCUTANEOUS | 11 refills | Status: DC

## 2020-11-28 MED ORDER — PYRIDOXINE HCL 50 MG PO TABS
50.0000 mg | ORAL_TABLET | Freq: Every day | ORAL | 0 refills | Status: AC
  Filled 2020-11-28: qty 30, 30d supply, fill #0

## 2020-11-28 MED ORDER — OXYCODONE HCL 15 MG PO TABS
15.0000 mg | ORAL_TABLET | Freq: Two times a day (BID) | ORAL | 0 refills | Status: DC
  Filled 2020-11-28: qty 10, 5d supply, fill #0

## 2020-11-28 MED ORDER — LANTUS SOLOSTAR 100 UNIT/ML ~~LOC~~ SOPN
30.0000 [IU] | PEN_INJECTOR | Freq: Every day | SUBCUTANEOUS | 11 refills | Status: DC
  Filled 2020-11-28: qty 9, 30d supply, fill #0

## 2020-11-28 NOTE — Progress Notes (Signed)
Inpatient Rehabilitation Care Coordinator Discharge Note   Patient Details  Name: Chelsea Mathis MRN: [id number] Date of Birth: 10/02/1990   Discharge location: HOME WITH FIANCE AND TWO YOUNG CHILDREN  Length of Stay:  69 DAYS  Discharge activity level: SUPERVISION LEVEL  Home/community participation: YES  Patient response MW:UXLKGM Literacy - How often do you need to have someone help you when you read instructions, pamphlets, or other written material from your doctor or pharmacy?: Never  Patient response WN:UUVOZD Isolation - How often do you feel lonely or isolated from those around you?: Rarely  Services provided included: MD, RD, PT, OT, SLP, RN, CM, Pharmacy, SW  Financial Services:  Financial Services Utilized: Medicaid    Choices offered to/list presented to: PT AND MOM  Follow-up services arranged:  Home Health, Patient/Family has no preference for HH/DME agencies, DME Home Health Agency: [facility]-PT & OT AND [facility] FOR IV ANTIBIOTICS    DME : [facility] ROLLATOR ROLLING WALKER, 3 IN 1 AND TUB BENCH    Patient response to transportation need: Is the patient able to respond to transportation needs?: Yes In the past 12 months, has lack of transportation kept you from medical appointments or from getting medications?: No In the past 12 months, has lack of transportation kept you from meetings, work, or from getting things needed for daily living?: No    Comments (or additional information): PT DID WELL AND REACHED HER GOALS HER MAIN ISSUE IS HER PAIN SHE IS ABLE TO MOVE JUST DEPENDS UPON HER PAIN AT THAT TIME. AWARE HELMS WILL BE AT HOUSE AT 5:00 PM 8/30 TO DO EDUCATION ON IV ANTIBIOTICS. HAS UNTIL 9/19.  Patient/Family verbalized understanding of follow-up arrangements:  Yes  Individual responsible for coordination of the follow-up plan: [name]-FIANCE [phone]  Confirmed correct DME delivered: [name] 11/28/2020    Kyann Heydt, Lemar Livings

## 2020-11-28 NOTE — Progress Notes (Signed)
Patient ID: Chelsea Mathis, female   DOB: 08/06/1990, 30 y.o.   MRN: [id number] Met with pt and mom who is present to go over [place] will be coming out tomorrow at 5:00 pm to educate on IV antibiotics and bring the supplies with. [facility] has accepted referral for PT and OT follow up. Pt has received her equipment and feels prepared to go home tomorrow. Aware will need to get first dose here prior to discharging home.

## 2020-11-28 NOTE — Progress Notes (Signed)
Physical Therapy Discharge Summary  Patient Details  Name: Chelsea Mathis MRN: [id number] Date of Birth: September 19, 1990  Today's Date: 11/28/2020 PT Individual Time: 9357-0177; 9390-3009 PT Individual Time Calculation (min): 74 min and 25 min   Patient has met 8 of 10 long term goals due to improved activity tolerance, improved balance, increased strength, increased range of motion, and decreased pain.  Patient to discharge at an ambulatory level Supervision.   Patient's care partner  available  to provide the necessary physical assistance at discharge. Pt's mother participated in family training and feels confident she can provide the necessary assistance at home.   Reasons goals not met: 2 goals regarding WC mobility are no longer applicable to pt as she is discharging at an ambulatory level and does not own a WC.   Recommendation:  Patient will benefit from ongoing skilled PT services in outpatient setting to continue to advance safe functional mobility, address ongoing impairments in global deconditioning, RLE pain, and minimize fall risk.  Equipment: Rollator  Reasons for discharge: treatment goals met and discharge from hospital  Patient/family agrees with progress made and goals achieved: Yes  PT Treatment Session 1 Pt received supine in bed, reported 8/10 pain in R hip. Nursing present to administer pain meds. Emphasis of session on prepare to DC home and pain management. Pt performed bed mobility mod I w/HOB flat and use of bedrail. Sit <>stand from low EOB to rollator w/supervision and pt ambulated 50' w/supervision prior to requesting transport in [facility] 2/2 pain. Stand pivot from rollator to [facility] w/supervision and pt was transported to ortho gym w/total A 2/2 pain. Sit <>stand from [facility] to rollator w/supervision and pt performed car transfer w/supervision and reclined seat. Educated pt on adjusting seat back in car to allow for easier management of Les, pt verbalized understanding. Pt  transported to main stairwell to attempt stair using lateral technique and R handrail. Pt ascended/descended 2 6" steps w/min A and BUE support on rail 2/2 pain. Pt agreed to attempt stairs during later session and requested to end session due to pain in R hip. Pt transported back to room w/total A for time management and performed sit <>stand from [facility] to rollator w/supervision. Using a reacher, pt picked up item from ground w/LUE supported on rollator and supervision. Pt ambulated 5' very slowly to reach EOB and was wincing from pain. Pt performed bed mobility mod I and was left supine in bed, reported pain as 8.5/10 in R hip and was provided heat pack for relief. All needs in reach.   Session 2 Pt received sitting in WC in room w/mom present, reported pain as 5/10 in R hip and had received pain meds prior to session. Emphasis of session on stair training. Pt self-propelled from room to ortho gym mod I and performed sit <>stand from [facility] to stairs w/supervision. Pt ascended/descended 12 6" steps w/2 rails and supervision using step-to pattern. Mother present for family training and feels confident she can provide assistance at home. Pt self-propelled mod I back to room and performed stand pivot to EOB w/no AD and supervision. Pt performed bed mobility mod I, reported pain as 6/10 in R hip. Pt was left supine in bed, IV team present in room, all needs in reach.   PT Discharge Precautions/Restrictions Precautions Precautions: Fall Precaution Comments: high levels of pain Restrictions Weight Bearing Restrictions: No Pain Interference Pain Interference Pain Effect on Sleep: 4. Almost constantly Pain Interference with Therapy Activities: 3. Frequently Pain Interference with  Day-to-Day Activities: 3. Frequently Vision/Perception  Perception Perception: Within Functional Limits Praxis Praxis: Intact  Cognition Overall Cognitive Status: Within Functional Limits for tasks assessed Orientation Level:  Oriented X4 Safety/Judgment: Appears intact Sensation Sensation Light Touch: Appears Intact Coordination Gross Motor Movements are Fluid and Coordinated: No Coordination and Movement Description: Affected by pain in R hip and RLE, fear-avoidance behavior Finger Nose Finger Test: Mild overshooting/incoordination at end range Heel Shin Test: <50% ROM LLE, <25% ROM RLE Motor  Motor Motor: Abnormal postural alignment and control Motor - Discharge Observations: Global deconditioning, RLE pain resulting in fear-avoidance behavior  Mobility Bed Mobility Bed Mobility: Rolling Right;Rolling Left;Supine to Sit;Sitting - Scoot to [employer] of Bed;Sit to Supine Rolling Right: Independent with assistive device (Bedrail, flat HOB) Rolling Left: Independent with assistive device (Bedrail, flat HOB) Supine to Sit: Independent with assistive device (Flat HOB, bedrail) Sitting - Scoot to Edge of Bed: Independent Sit to Supine: Independent with assistive device (HOB flat, bedrail) Transfers Transfers: Sit to Stand;Stand to Sit Sit to Stand: Supervision/Verbal cueing (rollator) Stand to Sit: Supervision/Verbal cueing (rollator) Transfer (Assistive device): Rollator Locomotion  Gait Ambulation: Yes Gait Assistance: Supervision/Verbal cueing Gait Distance (Feet): 150 Feet Assistive device: Rollator Gait Assistance Details: Verbal cues for safe use of DME/AE;Tactile cues for weight beaing Gait Gait: Yes Gait Pattern: Step-to pattern;Antalgic;Decreased step length - left;Decreased stance time - right;Decreased stride length;Decreased weight shift to right;Poor foot clearance - right;Poor foot clearance - left Gait velocity: decreased Stairs / Additional Locomotion Stairs: Yes Stairs Assistance: Supervision/Verbal cueing Stair Management Technique: Two rails Number of Stairs: 12 Height of Stairs: 6 Ramp: Supervision/Verbal cueing Curb: Supervision/Verbal Location manager  Mobility: Yes Wheelchair Assistance: Independent with Camera operator: Both upper extremities Wheelchair Parts Management: Independent Distance: >200'  Trunk/Postural Assessment  Cervical Assessment Cervical Assessment: Within Functional Limits Thoracic Assessment Thoracic Assessment: Exceptions to [facility] (Rounded shoulders) Lumbar Assessment Lumbar Assessment: Exceptions to [facility] (Sits w/posterior pelvic tilt, hip flexion in standing) Postural Control Postural Control: Within Functional Limits  Balance Balance Balance Assessed: Yes Static Sitting Balance Static Sitting - Balance Support: Feet supported;Bilateral upper extremity supported Static Sitting - Level of Assistance: 7: Independent Dynamic Sitting Balance Dynamic Sitting - Balance Support: Feet supported Dynamic Sitting - Level of Assistance: 7: Independent Static Standing Balance Static Standing - Balance Support: Bilateral upper extremity supported;During functional activity Static Standing - Level of Assistance: 5: Stand by assistance Dynamic Standing Balance Dynamic Standing - Balance Support: Bilateral upper extremity supported;During functional activity Dynamic Standing - Level of Assistance: 5: Stand by assistance Dynamic Standing - Balance Activities: Lateral lean/weight shifting;Forward lean/weight shifting;Reaching for objects Extremity Assessment  RLE Assessment RLE Assessment: Exceptions to [facility] RLE Strength RLE Overall Strength: Due to pain Right Hip Flexion: 3/5 Right Hip ABduction: 3+/5 Right Hip ADduction: 3+/5 Right Knee Flexion: 4/5 Right Knee Extension: 4-/5 Right Ankle Dorsiflexion: 4/5 Right Ankle Plantar Flexion: 4/5 LLE Assessment LLE Assessment: Exceptions to [facility] LLE Strength Left Hip Flexion: 4/5 Left Hip ABduction: 4+/5 Left Hip ADduction: 4+/5 Left Knee Flexion: 5/5 Left Knee Extension: 5/5 Left Ankle Dorsiflexion: 4/5 Left Ankle Plantar Flexion: 4/5   Odai Wimmer E  Olivya Sobol, PT, DPT 11/28/2020, 8:28 AM

## 2020-11-28 NOTE — Progress Notes (Signed)
[facility] PHYSICAL MEDICINE & REHABILITATION PROGRESS NOTE  Subjective/Complaints:  Feeling ok. Still hurts and has spasms but trying to deal with them.  ROS: Patient denies fever, rash, sore throat, blurred vision, nausea, vomiting, diarrhea, cough, shortness of breath or chest pain, joint or back pain, headache, or mood change.   Objective: Vital Signs: Blood pressure 122/71, pulse 93, temperature 98.9 F (37.2 C), temperature source Oral, resp. rate 18, height 5\' 4"  (1.626 m), weight 85 kg, last menstrual period 10/31/2020, SpO2 95 %, unknown if currently breastfeeding. No results found. Recent Labs    11/28/20 0315  WBC 10.2  HGB 8.1*  HCT 27.3*  PLT 616*    Recent Labs    11/28/20 0315  NA 134*  K 3.5  CL 102  CO2 25  GLUCOSE 121*  BUN <5*  CREATININE 0.30*  CALCIUM 8.7*     Intake/Output Summary (Last 24 hours) at 11/28/2020 1318 Last data filed at 11/27/2020 1844 Gross per 24 hour  Intake 236 ml  Output --  Net 236 ml        Physical Exam: BP 122/71 (BP Location: Left Leg)   Pulse 93   Temp 98.9 F (37.2 C) (Oral)   Resp 18   Ht 5\' 4"  (1.626 m)   Wt 85 kg   LMP 10/31/2020   SpO2 95%   BMI 32.17 kg/m     Constitutional: No distress . Vital signs reviewed. HEENT: NCAT, EOMI, oral membranes moist Neck: supple Cardiovascular: RRR without murmur. No JVD    Respiratory/Chest: CTA Bilaterally without wheezes or rales. Normal effort    GI/Abdomen: BS +, non-tender, non-distended Ext: no clubbing, cyanosis, or edema Psych: pleasant and cooperative     Musc: Right lower extremity edema.  No tenderness in extremities.remains tender along RIght lateral thigh Left ankle without swelling or ecchymosis, mild TTP over deltoid ligament  Neuro: Alert, speech slowed Motor: Left lower extremity: 4-5 proximal to distal (pain inhibition), stable Right lower extremity: Hip flexion, knee extension 2+ to 3-/5, ankle dorsiflexion 4 -/5 (pain inhibition),  stable  Assessment/Plan: 1. Functional deficits which require 3+ hours per day of interdisciplinary therapy in a comprehensive inpatient rehab setting. Physiatrist is providing close team supervision and 24 hour management of active medical problems listed below. Physiatrist and rehab team continue to assess barriers to discharge/monitor patient progress toward functional and medical goals   Care Tool:  Bathing    Body parts bathed by patient: Right arm, Left arm, Chest, Abdomen, Front perineal area, Right upper leg, Left upper leg, Right lower leg, Left lower leg, Face, Buttocks   Body parts bathed by helper: Buttocks     Bathing assist Assist Level: Set up assist     Upper Body Dressing/Undressing Upper body dressing   What is the patient wearing?: Pull over shirt, Bra    Upper body assist Assist Level: Independent with assistive device    Lower Body Dressing/Undressing Lower body dressing    Lower body dressing activity did not occur: Safety/medical concerns What is the patient wearing?: Underwear/pull up, Pants     Lower body assist Assist for lower body dressing: Independent with assitive device     Toileting Toileting Toileting Activity did not occur (Clothing management and hygiene only): N/A (no void or bm)  Toileting assist Assist for toileting: Moderate Assistance - Patient 50 - 74%     Transfers Chair/bed transfer  Transfers assist     Chair/bed transfer assist level: Supervision/Verbal cueing (rollator)  Locomotion Ambulation   Ambulation assist   Ambulation activity did not occur: Safety/medical concerns (fatigue, pain, decreased balance/postural control)  Assist level: Supervision/Verbal cueing Assistive device: Rollator Max distance: 150'   Walk 10 feet activity   Assist  Walk 10 feet activity did not occur: Safety/medical concerns (fatigue, pain, decreased balance/postural control)  Assist level: Supervision/Verbal  cueing Assistive device: Rollator   Walk 50 feet activity   Assist Walk 50 feet with 2 turns activity did not occur: Safety/medical concerns (fatigue, pain, decreased balance/postural control)  Assist level: Supervision/Verbal cueing Assistive device: Rollator    Walk 150 feet activity   Assist Walk 150 feet activity did not occur: Safety/medical concerns (fatigue, pain, decreased balance/postural control)  Assist level: Supervision/Verbal cueing Assistive device: Rollator    Walk 10 feet on uneven surface  activity   Assist Walk 10 feet on uneven surfaces activity did not occur: Safety/medical concerns (fatigue, pain, decreased balance/postural control)   Assist level: Supervision/Verbal cueing Assistive device: Rollator   Wheelchair     Assist Is the patient using a wheelchair?: No Type of Wheelchair: Manual Wheelchair activity did not occur: Safety/medical concerns (fatigue, pain, decreased balance/postural control)  Wheelchair assist level: Independent Max wheelchair distance: >54'    Wheelchair 50 feet with 2 turns activity    Assist    Wheelchair 50 feet with 2 turns activity did not occur: Safety/medical concerns (fatigue, pain, decreased balance/postural control)   Assist Level: Independent   Wheelchair 150 feet activity     Assist  Wheelchair 150 feet activity did not occur: Safety/medical concerns (fatigue, pain, decreased balance/postural control)   Assist Level: Independent    Medical Problem List and Plan: 1.   Debility secondary to severe sepsis/bacteremia due to Staph aureus as well as weakness/pain from myositis  Continue CIR- PT, OT   -ELOS 8/30 2.  Antithrombotics: -DVT/anticoagulation: Venous Doppler studies negative Mechanical:  Antiembolism stockings, knee (TED hose) Bilateral lower extremities Sequential compression devices, below knee Bilateral lower extremities             -antiplatelet therapy: N/A 3. Lower extremity  pain:  Robaxin scheduled on 8/14             -kpad for muscular pain in legs  Continue OxyContin started on 8/13  Continue gabapentin 300mg  TID  Continue voltaren gel to right thigh Left ankle pain likely mild liagamentous strain, may ice after therapy and wrap prior to therapy   Add B6 50mg  daily to help with sensitivity to pain  8/27- pain stable but bothersome- con't regimen  8/29 tizanidine 2mg  tid seems to have helped.  use Robaxin q6 hours prn- switched to prn.  4. Anxiety: continue Atarax 25 mg twice daily as needed anxiety as well as 50 mg nightly as needed insomnia             -antipsychotic agents: N/A 5. Neuropsych: This patient is capable of making decisions on her  own behalf. 6. LUE forearm and RUE dorsum of hand IV infiltrations:  Placed nursing order for warm compress 15 minutes TID. Educated patient regarding benefits of this for pain control. PICC placed 7. Fluids/Electrolytes/Nutrition: Routine in and outs 8.  ID/Bacteremia/MRSA endocarditis.  Follow-up infectious disease..  Continue vancomycin through 12/19/2020 9.    Acute pulmonary emboli felt to be secondary to septic pulmonary emboli .initially on heparin which was discontinued as patient developed anemia.  Lower extremity Dopplers negative.  Pulmonary was involved no recommendations for ongoing anticoagulation at this time 10.  Anemia/leukocytosis: improving, monitor weekly 11.  Hyponatremia.   Sodium 132 on 8/15,   8/29 134  Continue to monitor 12.  Poorly controlled diabetes mellitus.  Hemoglobin A1c 12.3.   NovoLog 8 units 3 times daily, d/ced on 8/14 Semglee to 30 units daily on 8/13.  Diabetic teaching             -adjust regimen as needed    CBG (last 3)  Recent Labs    11/27/20 2113 11/28/20 0617 11/28/20 1149  GLUCAP 143* 130* 146*    8/27- decreased Insulin  to 24 units from 29 units- had 2 episodes in 2 days of 57/59 BG's. Was NOT on insulin at home- will need insulin teaching before d/c.   8/29  fair control with SSI alone---may need oral agent as outpt 13.  Constipation. Continue Miralax BID  Last bm 8/26--- 14. Dry nose: continue scheduled saline nasal spray BID  LOS: 17 days A FACE TO FACE EVALUATION WAS PERFORMED  Ranelle Oyster 11/28/2020, 1:18 PM

## 2020-11-28 NOTE — Progress Notes (Signed)
Patient ID: Chelsea Mathis, female   DOB: 23-May-1990, 30 y.o.   MRN: [id number] Follow up with the patient and mother regarding pending discharge and self administration of medication/insulin. Patient reported she is comfortable with previous education and mother is a diabetic taking insulin. Reviewed Semglee dose and CMM diet with HS snack. Patient and mother both acknowledge familiarity with insulin administration. Continue to follow along to discharge to address questions. [name]

## 2020-11-28 NOTE — Progress Notes (Signed)
Occupational Therapy Session Note  Patient Details  Name: Chelsea Mathis MRN: [id number] Date of Birth: 01-01-1991  Today's Date: 11/28/2020 OT Individual Time: 1015-1100 OT Individual Time Calculation (min): 45 min    Short Term Goals: Week 2:  OT Short Term Goal 1 (Week 2): Pt will complete 1/3 components of donning pants using AE as needed OT Short Term Goal 2 (Week 2): Pt will complete 1 grooming task while standing at the sink OT Short Term Goal 3 (Week 2): Pt will complete bathing at sit<stand level with CGA using AE as needed   Skilled Therapeutic Interventions/Progress Updates:    Pt semi reclined in bed, c/o 8/10 pain at rest anterior thigh and lateral hip.  Nurse made aware, and medication administered at beginning of session (see MAR).  Pt requesting to wash up at sink.  Min assist needed supine to sit due to pain RLE.  Sit to stand and step pivot using RW with supervision.  Pt completed sinkside grooming and oral hygiene with setup.  Doffed shirt, underwear and pants with mod I. Needing assist to doff socks but reports she was taught how using reacher in previous session. Bathed UB and lb with setup sitting/standing at sink.  Donned shirt with mod I.  Donned pants with min assist due to pain level however reports can complete this using reacher as well.  Pt needing to use toilet.  Step pivot transfer to [facility] with supervision and toileting with setup to provide wet washcloth for pericare.  Pt requesting to return to bed at end of session and completed sit to supine with mod I.  Call bell in reach, bed alarm on at end of session.  Therapy Documentation Precautions:  Precautions Precautions: Fall Precaution Comments: high levels of pain Restrictions Weight Bearing Restrictions: No   Therapy/Group: Individual Therapy  Amie Critchley 11/28/2020, 2:27 PM

## 2020-11-28 NOTE — Plan of Care (Signed)
  Problem: RH Balance Goal: LTG Patient will maintain dynamic standing balance (PT) Description: LTG:  Patient will maintain dynamic standing balance with assistance during mobility activities (PT) Outcome: Completed/Met   Problem: Sit to Stand Goal: LTG:  Patient will perform sit to stand with assistance level (PT) Description: LTG:  Patient will perform sit to stand with assistance level (PT) Outcome: Completed/Met   Problem: RH Bed Mobility Goal: LTG Patient will perform bed mobility with assist (PT) Description: LTG: Patient will perform bed mobility with assistance, with/without cues (PT). Outcome: Completed/Met   Problem: RH Bed to Chair Transfers Goal: LTG Patient will perform bed/chair transfers w/assist (PT) Description: LTG: Patient will perform bed to chair transfers with assistance (PT). Outcome: Completed/Met   Problem: RH Car Transfers Goal: LTG Patient will perform car transfers with assist (PT) Description: LTG: Patient will perform car transfers with assistance (PT). Outcome: Completed/Met   Problem: RH Ambulation Goal: LTG Patient will ambulate in controlled environment (PT) Description: LTG: Patient will ambulate in a controlled environment, # of feet with assistance (PT). Outcome: Completed/Met Goal: LTG Patient will ambulate in home environment (PT) Description: LTG: Patient will ambulate in home environment, # of feet with assistance (PT). Outcome: Completed/Met   Problem: RH Stairs Goal: LTG Patient will ambulate up and down stairs w/assist (PT) Description: LTG: Patient will ambulate up and down # of stairs with assistance (PT) Outcome: Completed/Met   Problem: RH Wheelchair Mobility Goal: LTG Patient will propel w/c in controlled environment (PT) Description: LTG: Patient will propel wheelchair in controlled environment, # of feet with assist (PT) Outcome: Not Applicable Note: Pt has met goal but is discharging at an ambulatory level  Goal: LTG  Patient will propel w/c in home environment (PT) Description: LTG: Patient will propel wheelchair in home environment, # of feet with assistance (PT). Outcome: Not Applicable Note: Pt has met goal but is discharging at an ambulatory level

## 2020-11-28 NOTE — Progress Notes (Signed)
Occupational Therapy Session Note  Patient Details  Name: Chelsea Mathis MRN: [id number] Date of Birth: January 08, 1991  Today's Date: 11/28/2020 OT Individual Time: 5035-4656 OT Individual Time Calculation (min): 49 min    Short Term Goals: Week 2:  OT Short Term Goal 1 (Week 2): Pt will complete 1/3 components of donning pants using AE as needed OT Short Term Goal 2 (Week 2): Pt will complete 1 grooming task while standing at the sink OT Short Term Goal 3 (Week 2): Pt will complete bathing at sit<stand level with CGA using AE as needed  Skilled Therapeutic Interventions/Progress Updates:Pt greeted supine in bed agreeable to OT intervention with pts mom present during session. Session focus on functional transfers and family education as pt reports she maybe staying with her mom/ mother staying with pt intermittently. Pt able to transition supine to sitting MOD I with pt using UEs to maneuver BLEs to EOB, issued pt leg lifter for home during session to assist with bed mobility. Pt completed stand pivot transfer from EOB >w/c with rollator with supervision. Pt transported to shower room with total A for time mgmt. Pt completed ambulatory shower transfer with rollator to TTB, pts mom present for education with mother observing transfer. Pts mother reports that she uses TTB at home is familiar with how to use. Discussed pts current level of assist needed for ADLS with education provided to pts mother on how to best assist pt during ADLS. Issued pt reacher for LB ADLS. Pt returned to room in similar fashion as previously indicated with pt left up in w/c with all needs within reach.                  Therapy Documentation Precautions:  Precautions Precautions: Fall Precaution Comments: high levels of pain Restrictions Weight Bearing Restrictions: No  Pain: Pt reports 6/10 pain in BLEs, Rn enter to provide pain meds.    Therapy/Group: Individual Therapy  [facility] 11/28/2020, 4:01 PM

## 2020-11-29 ENCOUNTER — Other Ambulatory Visit (HOSPITAL_COMMUNITY): Payer: Self-pay

## 2020-11-29 LAB — GLUCOSE, CAPILLARY
Glucose-Capillary: 127 mg/dL — ABNORMAL HIGH (ref 70–99)
Glucose-Capillary: 157 mg/dL — ABNORMAL HIGH (ref 70–99)

## 2020-11-29 MED ORDER — HEPARIN SOD (PORK) LOCK FLUSH 100 UNIT/ML IV SOLN
250.0000 [IU] | INTRAVENOUS | Status: DC | PRN
Start: 2020-11-29 — End: 2020-11-29
  Filled 2020-11-29: qty 2.5

## 2020-11-29 MED ORDER — OXYCODONE HCL ER 10 MG PO T12A
10.0000 mg | EXTENDED_RELEASE_TABLET | Freq: Two times a day (BID) | ORAL | 0 refills | Status: AC
  Filled 2020-11-29: qty 14, 7d supply, fill #0

## 2020-11-29 MED ORDER — OXYCODONE HCL ER 10 MG PO T12A
10.0000 mg | EXTENDED_RELEASE_TABLET | Freq: Two times a day (BID) | ORAL | Status: DC
Start: 2020-11-29 — End: 2020-11-29

## 2020-11-29 MED ORDER — OXYCODONE-ACETAMINOPHEN 5-325 MG PO TABS
1.0000 | ORAL_TABLET | ORAL | 0 refills | Status: DC | PRN
  Filled 2020-11-29: qty 30, 5d supply, fill #0

## 2020-11-29 NOTE — Progress Notes (Signed)
[facility] PHYSICAL MEDICINE & REHABILITATION PROGRESS NOTE  Subjective/Complaints:  Right thigh still painful. Asked how long it might last.   ROS: Patient denies fever, rash, sore throat, blurred vision, nausea, vomiting, diarrhea, cough, shortness of breath or chest pain,   headache, or mood change.   Objective: Vital Signs: Blood pressure 120/65, pulse 82, temperature 97.9 F (36.6 C), temperature source Oral, resp. rate 18, height 5\' 4"  (1.626 m), weight 85.8 kg, last menstrual period 10/31/2020, SpO2 99 %, unknown if currently breastfeeding. No results found. Recent Labs    11/28/20 0315  WBC 10.2  HGB 8.1*  HCT 27.3*  PLT 616*    Recent Labs    11/28/20 0315  NA 134*  K 3.5  CL 102  CO2 25  GLUCOSE 121*  BUN <5*  CREATININE 0.30*  CALCIUM 8.7*     Intake/Output Summary (Last 24 hours) at 11/29/2020 0916 Last data filed at 11/29/2020 0700 Gross per 24 hour  Intake 360 ml  Output --  Net 360 ml        Physical Exam: BP 120/65 (BP Location: Left Leg)   Pulse 82   Temp 97.9 F (36.6 C) (Oral)   Resp 18   Ht 5\' 4"  (1.626 m)   Wt 85.8 kg   LMP 10/31/2020   SpO2 99%   BMI 32.47 kg/m     Constitutional: No distress . Vital signs reviewed. HEENT: NCAT, EOMI, oral membranes moist Neck: supple Cardiovascular: RRR without murmur. No JVD    Respiratory/Chest: CTA Bilaterally without wheezes or rales. Normal effort    GI/Abdomen: BS +, non-tender, non-distended Ext: no clubbing, cyanosis, or edema Psych: pleasant and cooperative  Musc: Right lower extremity edema.   .remains tender along RIght lateral thigh. Has heating pad Left ankle without swelling or ecchymosis, mild TTP over deltoid ligament  Neuro: Alert, speech slowed Motor: Left lower extremity: 4-5 proximal to distal (pain inhibition), stable Right lower extremity: Hip flexion, knee extension 2+ to 3-/5, ankle dorsiflexion 4 -/5 (pain inhibition), no changes  Assessment/Plan: 1. Functional  deficits which require 3+ hours per day of interdisciplinary therapy in a comprehensive inpatient rehab setting. Physiatrist is providing close team supervision and 24 hour management of active medical problems listed below. Physiatrist and rehab team continue to assess barriers to discharge/monitor patient progress toward functional and medical goals   Care Tool:  Bathing    Body parts bathed by patient: Right arm, Left arm, Chest, Abdomen, Front perineal area, Right upper leg, Left upper leg, Right lower leg, Left lower leg, Face, Buttocks   Body parts bathed by helper: Buttocks     Bathing assist Assist Level: Set up assist     Upper Body Dressing/Undressing Upper body dressing   What is the patient wearing?: Pull over shirt, Bra    Upper body assist Assist Level: Independent with assistive device    Lower Body Dressing/Undressing Lower body dressing    Lower body dressing activity did not occur: Safety/medical concerns What is the patient wearing?: Underwear/pull up, Pants     Lower body assist Assist for lower body dressing: Independent with assitive device     Toileting Toileting Toileting Activity did not occur (Clothing management and hygiene only): N/A (no void or bm)  Toileting assist Assist for toileting: Set up assist     Transfers Chair/bed transfer  Transfers assist     Chair/bed transfer assist level: Supervision/Verbal cueing (rollator)     Locomotion Ambulation   Ambulation assist  Ambulation activity did not occur: Safety/medical concerns (fatigue, pain, decreased balance/postural control)  Assist level: Supervision/Verbal cueing Assistive device: Rollator Max distance: 150'   Walk 10 feet activity   Assist  Walk 10 feet activity did not occur: Safety/medical concerns (fatigue, pain, decreased balance/postural control)  Assist level: Supervision/Verbal cueing Assistive device: Rollator   Walk 50 feet activity   Assist Walk 50  feet with 2 turns activity did not occur: Safety/medical concerns (fatigue, pain, decreased balance/postural control)  Assist level: Supervision/Verbal cueing Assistive device: Rollator    Walk 150 feet activity   Assist Walk 150 feet activity did not occur: Safety/medical concerns (fatigue, pain, decreased balance/postural control)  Assist level: Supervision/Verbal cueing Assistive device: Rollator    Walk 10 feet on uneven surface  activity   Assist Walk 10 feet on uneven surfaces activity did not occur: Safety/medical concerns (fatigue, pain, decreased balance/postural control)   Assist level: Supervision/Verbal cueing Assistive device: Rollator   Wheelchair     Assist Is the patient using a wheelchair?: No Type of Wheelchair: Manual Wheelchair activity did not occur: Safety/medical concerns (fatigue, pain, decreased balance/postural control)  Wheelchair assist level: Independent Max wheelchair distance: >71'    Wheelchair 50 feet with 2 turns activity    Assist    Wheelchair 50 feet with 2 turns activity did not occur: Safety/medical concerns (fatigue, pain, decreased balance/postural control)   Assist Level: Independent   Wheelchair 150 feet activity     Assist  Wheelchair 150 feet activity did not occur: Safety/medical concerns (fatigue, pain, decreased balance/postural control)   Assist Level: Independent    Medical Problem List and Plan: 1.   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  -f/u with [facility], primary, ID, ortho +HH 2.  Antithrombotics: -DVT/anticoagulation: Venous Doppler studies negative Mechanical:  Antiembolism stockings, knee (TED hose) Bilateral lower extremities Sequential compression devices, below knee Bilateral lower extremities             -antiplatelet therapy: N/A 3. Lower extremity pain:  Robaxin scheduled on 8/14             -kpad for  muscular pain in legs  Continue OxyContin 15mg  bid  Continue gabapentin 300mg  TID  Continue voltaren gel to right thigh Left ankle pain likely mild liagamentous strain, may ice after therapy and wrap prior to therapy   Added B6 50mg  daily to help with sensitivity to pain  8/30 tizanidine 2mg  tid somewhat helpful--can dc home on this  -discussed stretching, strengthening, heat, ice as well  -may need further reassessment if not improving over the next 2 weeks 4. Anxiety: continue Atarax 25 mg twice daily as needed anxiety as well as 50 mg nightly as needed insomnia             -antipsychotic agents: N/A 5. Neuropsych: This patient is capable of making decisions on her  own behalf. 6. LUE forearm and RUE dorsum of hand IV infiltrations:  Placed nursing order for warm compress 15 minutes TID. Educated patient regarding benefits of this for pain control. PICC placed 7. Fluids/Electrolytes/Nutrition: Routine in and outs 8.  ID/Bacteremia/MRSA endocarditis.  Follow-up infectious disease..  Continue vancomycin through 12/19/2020 9.    Acute pulmonary emboli felt to be secondary to septic pulmonary emboli .initially on heparin which was discontinued as patient developed anemia.  Lower extremity Dopplers negative.  Pulmonary was involved no recommendations for ongoing anticoagulation at this time 10.  Anemia/leukocytosis: improving,  monitor weekly 11.  Hyponatremia.   Sodium 132 on 8/15,   8/29 134  Continue to monitor 12.  Poorly controlled diabetes mellitus.  Hemoglobin A1c 12.3.   NovoLog 8 units 3 times daily, d/ced on 8/14 Semglee to 30 units daily on 8/13.  Diabetic teaching             -adjust regimen as needed    CBG (last 3)  Recent Labs    11/28/20 1630 11/28/20 2110 11/29/20 0531  GLUCAP 135* 166* 127*    8/27- decreased Insulin  to 24 units from 29 units- had 2 episodes in 2 days of 57/59 BG's. Was NOT on insulin at home- will need insulin teaching before d/c.   8/30 fair control  with SSI alone---may need oral agent as outpt. Discussed with pt and husband 61.  Constipation. Continue Miralax BID  Last bm 8/26--- 14. Dry nose: continue scheduled saline nasal spray BID  LOS: 18 days A FACE TO FACE EVALUATION WAS PERFORMED  Ranelle Oyster 11/29/2020, 9:16 AM

## 2020-12-02 ENCOUNTER — Other Ambulatory Visit (HOSPITAL_COMMUNITY): Payer: Self-pay | Admitting: Physician Assistant

## 2020-12-02 ENCOUNTER — Other Ambulatory Visit (HOSPITAL_COMMUNITY): Payer: Self-pay

## 2020-12-03 ENCOUNTER — Emergency Department (HOSPITAL_COMMUNITY): Payer: Medicaid Other

## 2020-12-03 ENCOUNTER — Encounter (HOSPITAL_COMMUNITY): Payer: Self-pay

## 2020-12-03 ENCOUNTER — Inpatient Hospital Stay (HOSPITAL_COMMUNITY)
Admission: EM | Admit: 2020-12-03 | Discharge: 2020-12-11 | DRG: 871 | Disposition: A | Discharge status: Home Health | Payer: Medicaid Other | Attending: Family Medicine | Admitting: Family Medicine

## 2020-12-03 ENCOUNTER — Other Ambulatory Visit: Payer: Self-pay

## 2020-12-03 DIAGNOSIS — I330 Acute and subacute infective endocarditis: Secondary | ICD-10-CM | POA: Diagnosis not present

## 2020-12-03 DIAGNOSIS — M009 Pyogenic arthritis, unspecified: Secondary | ICD-10-CM | POA: Diagnosis present

## 2020-12-03 DIAGNOSIS — A419 Sepsis, unspecified organism: Secondary | ICD-10-CM

## 2020-12-03 DIAGNOSIS — R7881 Bacteremia: Secondary | ICD-10-CM

## 2020-12-03 DIAGNOSIS — Z806 Family history of leukemia: Secondary | ICD-10-CM

## 2020-12-03 DIAGNOSIS — Z79899 Other long term (current) drug therapy: Secondary | ICD-10-CM

## 2020-12-03 DIAGNOSIS — M25551 Pain in right hip: Secondary | ICD-10-CM | POA: Diagnosis present

## 2020-12-03 DIAGNOSIS — M609 Myositis, unspecified: Secondary | ICD-10-CM | POA: Diagnosis present

## 2020-12-03 DIAGNOSIS — R112 Nausea with vomiting, unspecified: Secondary | ICD-10-CM | POA: Diagnosis not present

## 2020-12-03 DIAGNOSIS — J90 Pleural effusion, not elsewhere classified: Secondary | ICD-10-CM | POA: Diagnosis present

## 2020-12-03 DIAGNOSIS — R109 Unspecified abdominal pain: Secondary | ICD-10-CM

## 2020-12-03 DIAGNOSIS — E119 Type 2 diabetes mellitus without complications: Secondary | ICD-10-CM | POA: Diagnosis present

## 2020-12-03 DIAGNOSIS — Z8249 Family history of ischemic heart disease and other diseases of the circulatory system: Secondary | ICD-10-CM

## 2020-12-03 DIAGNOSIS — G894 Chronic pain syndrome: Secondary | ICD-10-CM | POA: Diagnosis present

## 2020-12-03 DIAGNOSIS — N12 Tubulo-interstitial nephritis, not specified as acute or chronic: Secondary | ICD-10-CM | POA: Diagnosis present

## 2020-12-03 DIAGNOSIS — Z823 Family history of stroke: Secondary | ICD-10-CM

## 2020-12-03 DIAGNOSIS — Z20822 Contact with and (suspected) exposure to covid-19: Secondary | ICD-10-CM | POA: Diagnosis present

## 2020-12-03 DIAGNOSIS — E876 Hypokalemia: Secondary | ICD-10-CM | POA: Diagnosis present

## 2020-12-03 DIAGNOSIS — M60005 Infective myositis, unspecified leg: Secondary | ICD-10-CM

## 2020-12-03 DIAGNOSIS — R6520 Severe sepsis without septic shock: Secondary | ICD-10-CM | POA: Diagnosis present

## 2020-12-03 DIAGNOSIS — M4650 Other infective spondylopathies, site unspecified: Secondary | ICD-10-CM

## 2020-12-03 DIAGNOSIS — M4658 Other infective spondylopathies, sacral and sacrococcygeal region: Secondary | ICD-10-CM

## 2020-12-03 DIAGNOSIS — I2690 Septic pulmonary embolism without acute cor pulmonale: Secondary | ICD-10-CM | POA: Diagnosis present

## 2020-12-03 DIAGNOSIS — A4102 Sepsis due to Methicillin resistant Staphylococcus aureus: Principal | ICD-10-CM | POA: Diagnosis present

## 2020-12-03 DIAGNOSIS — B9561 Methicillin susceptible Staphylococcus aureus infection as the cause of diseases classified elsewhere: Secondary | ICD-10-CM | POA: Diagnosis present

## 2020-12-03 DIAGNOSIS — E669 Obesity, unspecified: Secondary | ICD-10-CM | POA: Diagnosis present

## 2020-12-03 DIAGNOSIS — Z8632 Personal history of gestational diabetes: Secondary | ICD-10-CM

## 2020-12-03 DIAGNOSIS — L02416 Cutaneous abscess of left lower limb: Secondary | ICD-10-CM | POA: Diagnosis present

## 2020-12-03 DIAGNOSIS — M25552 Pain in left hip: Secondary | ICD-10-CM | POA: Diagnosis present

## 2020-12-03 DIAGNOSIS — Z833 Family history of diabetes mellitus: Secondary | ICD-10-CM

## 2020-12-03 LAB — CBC WITH DIFFERENTIAL/PLATELET
Abs Immature Granulocytes: 0.06 10*3/uL (ref 0.00–0.07)
Basophils Absolute: 0 10*3/uL (ref 0.0–0.1)
Basophils Relative: 0 %
Eosinophils Absolute: 0 10*3/uL (ref 0.0–0.5)
Eosinophils Relative: 0 %
HCT: 34.9 % — ABNORMAL LOW (ref 36.0–46.0)
Hemoglobin: 10.6 g/dL — ABNORMAL LOW (ref 12.0–15.0)
Immature Granulocytes: 1 %
Lymphocytes Relative: 14 %
Lymphs Abs: 1.7 10*3/uL (ref 0.7–4.0)
MCH: 25.5 pg — ABNORMAL LOW (ref 26.0–34.0)
MCHC: 30.4 g/dL (ref 30.0–36.0)
MCV: 83.9 fL (ref 80.0–100.0)
Monocytes Absolute: 0.2 10*3/uL (ref 0.1–1.0)
Monocytes Relative: 2 %
Neutro Abs: 10 10*3/uL — ABNORMAL HIGH (ref 1.7–7.7)
Neutrophils Relative %: 83 %
Platelets: 508 10*3/uL — ABNORMAL HIGH (ref 150–400)
RBC: 4.16 MIL/uL (ref 3.87–5.11)
RDW: 15.9 % — ABNORMAL HIGH (ref 11.5–15.5)
WBC: 12 10*3/uL — ABNORMAL HIGH (ref 4.0–10.5)
nRBC: 0 % (ref 0.0–0.2)

## 2020-12-03 LAB — I-STAT VENOUS BLOOD GAS, ED
Acid-base deficit: 2 mmol/L (ref 0.0–2.0)
Bicarbonate: 22.8 mmol/L (ref 20.0–28.0)
Calcium, Ion: 1.12 mmol/L — ABNORMAL LOW (ref 1.15–1.40)
HCT: 37 % (ref 36.0–46.0)
Hemoglobin: 12.6 g/dL (ref 12.0–15.0)
O2 Saturation: 73 %
Potassium: 3.6 mmol/L (ref 3.5–5.1)
Sodium: 140 mmol/L (ref 135–145)
TCO2: 24 mmol/L (ref 22–32)
pCO2, Ven: 40.1 mmHg — ABNORMAL LOW (ref 44.0–60.0)
pH, Ven: 7.363 (ref 7.250–7.430)
pO2, Ven: 40 mmHg (ref 32.0–45.0)

## 2020-12-03 LAB — URINALYSIS, ROUTINE W REFLEX MICROSCOPIC
Bilirubin Urine: NEGATIVE
Glucose, UA: NEGATIVE mg/dL
Hgb urine dipstick: NEGATIVE
Ketones, ur: 80 mg/dL — AB
Nitrite: NEGATIVE
Protein, ur: 30 mg/dL — AB
Specific Gravity, Urine: 1.016 (ref 1.005–1.030)
pH: 6 (ref 5.0–8.0)

## 2020-12-03 LAB — APTT: aPTT: 35 seconds (ref 24–36)

## 2020-12-03 LAB — COMPREHENSIVE METABOLIC PANEL
ALT: 11 U/L (ref 0–44)
AST: 16 U/L (ref 15–41)
Albumin: 2.7 g/dL — ABNORMAL LOW (ref 3.5–5.0)
Alkaline Phosphatase: 110 U/L (ref 38–126)
Anion gap: 12 (ref 5–15)
BUN: <5 mg/dL — ABNORMAL LOW (ref 6–20)
CO2: 23 mmol/L (ref 22–32)
Calcium: 9.1 mg/dL (ref 8.9–10.3)
Chloride: 101 mmol/L (ref 98–111)
Creatinine, Ser: 0.43 mg/dL — ABNORMAL LOW (ref 0.44–1.00)
GFR, Estimated: >60 mL/min (ref >60)
Glucose, Bld: 174 mg/dL — ABNORMAL HIGH (ref 70–99)
Potassium: 3.4 mmol/L — ABNORMAL LOW (ref 3.5–5.1)
Sodium: 136 mmol/L (ref 135–145)
Total Bilirubin: 1.1 mg/dL (ref 0.3–1.2)
Total Protein: 9.2 g/dL — ABNORMAL HIGH (ref 6.5–8.1)

## 2020-12-03 LAB — I-STAT BETA HCG BLOOD, ED (MC, WL, AP ONLY): I-stat hCG, quantitative: <5 m[IU]/mL (ref <5)

## 2020-12-03 LAB — PROTIME-INR
INR: 1.3 — ABNORMAL HIGH (ref 0.8–1.2)
Prothrombin Time: 15.8 seconds — ABNORMAL HIGH (ref 11.4–15.2)

## 2020-12-03 LAB — CK: Total CK: 25 U/L — ABNORMAL LOW (ref 38–234)

## 2020-12-03 LAB — CBG MONITORING, ED: Glucose-Capillary: 178 mg/dL — ABNORMAL HIGH (ref 70–99)

## 2020-12-03 LAB — LACTIC ACID, PLASMA
Lactic Acid, Venous: 1.9 mmol/L (ref 0.5–1.9)
Lactic Acid, Venous: 0.9 mmol/L (ref 0.5–1.9)

## 2020-12-03 LAB — BETA-HYDROXYBUTYRIC ACID: Beta-Hydroxybutyric Acid: 1.97 mmol/L — ABNORMAL HIGH (ref 0.05–0.27)

## 2020-12-03 IMAGING — CT CT ABD-PELV W/ CM
2 of 4 series · 15 of 46 positions shown, 17 images · IV contrast (Omni 300)
Comparison: Abdominopelvic CT [DATE]

CLINICAL DATA: Abdominal abscess/infection suspected
Nausea/vomiting

EXAM:
CT ABDOMEN AND PELVIS WITH CONTRAST
TECHNIQUE: Multidetector CT imaging of the abdomen and pelvis was performed
using the standard protocol following bolus administration of
intravenous contrast.
CONTRAST:  80mL OMNIPAQUE IOHEXOL 350 MG/ML SOLN

[Series 3: a/p w/ 5mm · axial · 0.94mm/px · z∈[+905,+1320]mm · 12 of 95 slices shown, 14 images]
[im 8/95  soft-tissue]
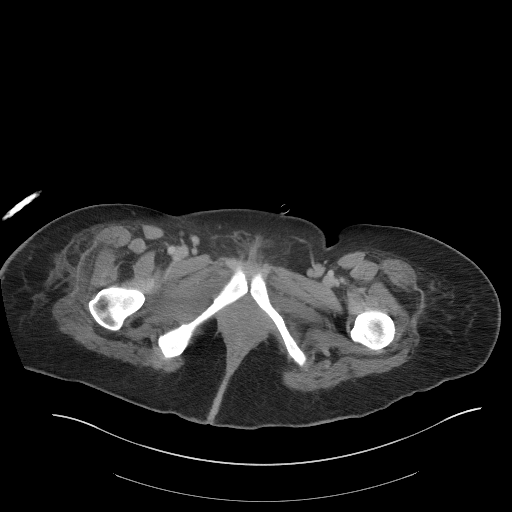
[im 8/95  bone]
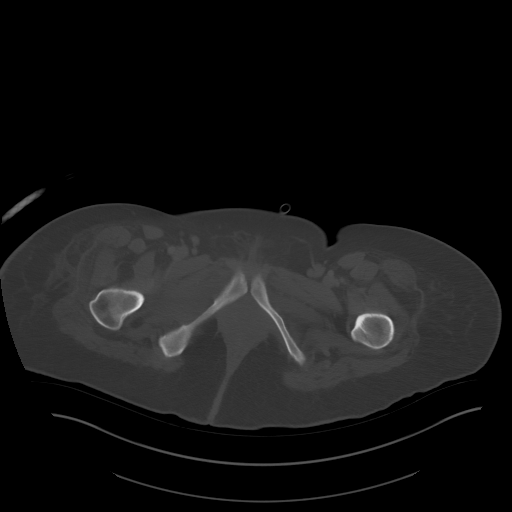
[im 16/95  soft-tissue]
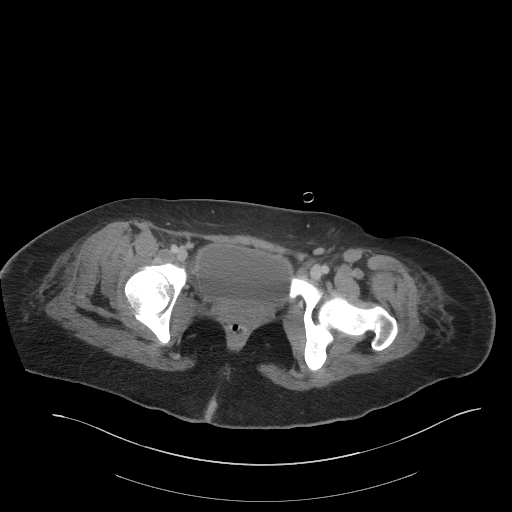
[im 23/95  soft-tissue]
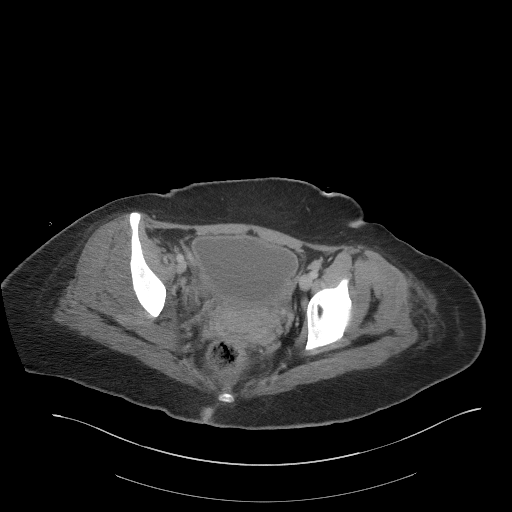
[im 31/95  soft-tissue]
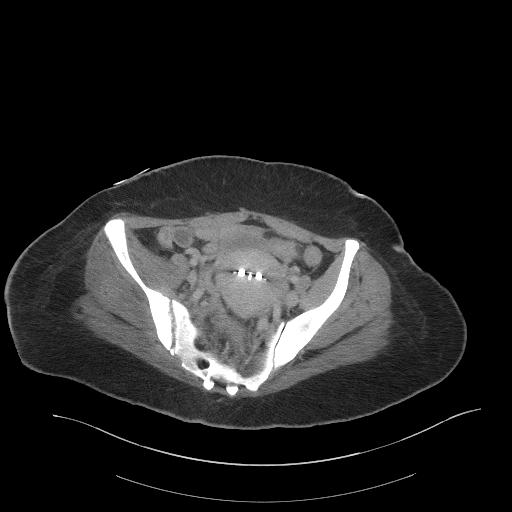
[im 38/95  soft-tissue]
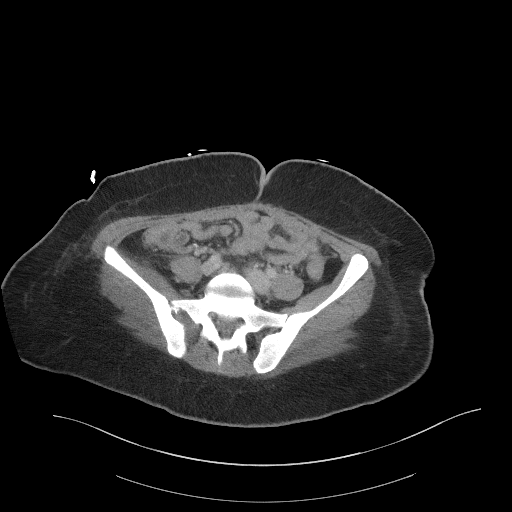
[im 46/95  soft-tissue]
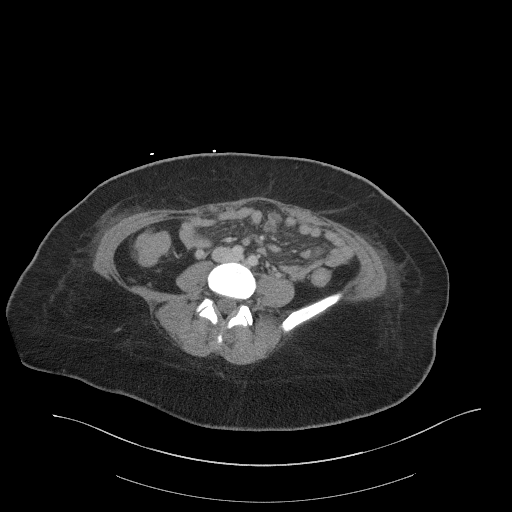
[im 53/95  soft-tissue]
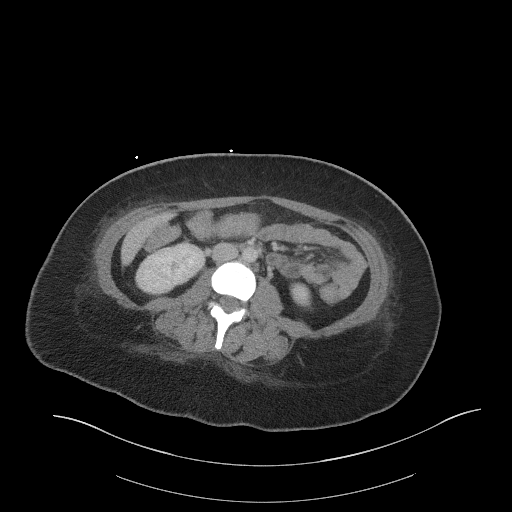
[im 61/95  soft-tissue]
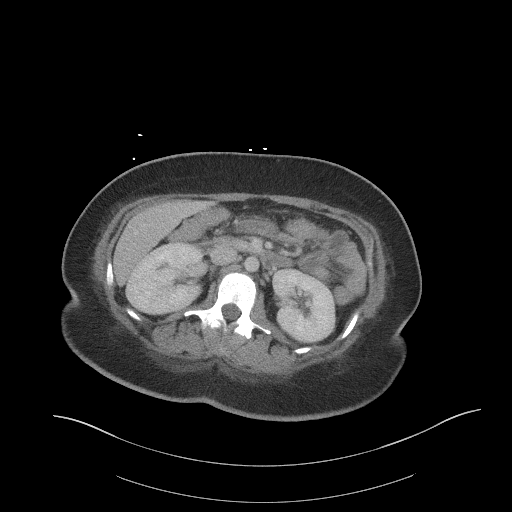
[im 68/95  soft-tissue]
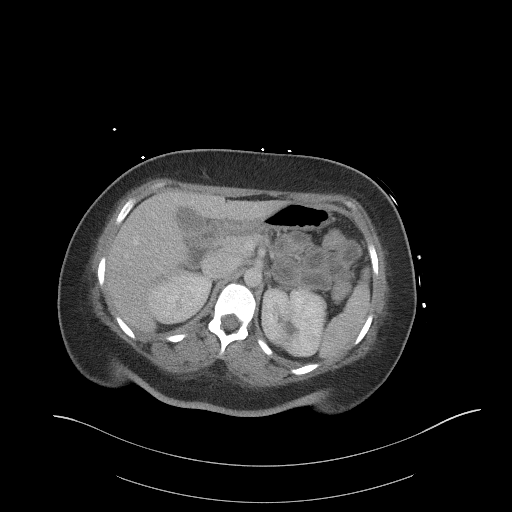
[im 68/95  bone]
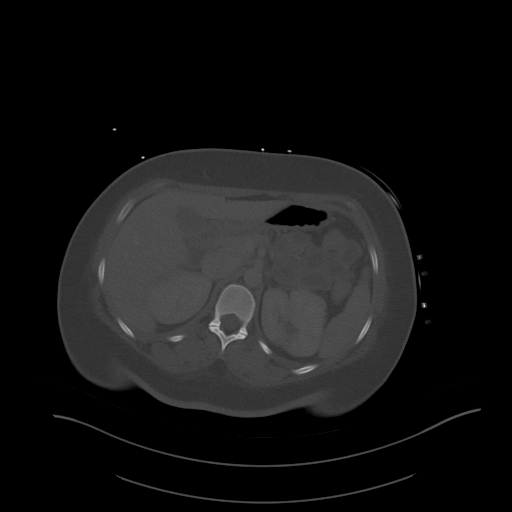
[im 76/95  soft-tissue]
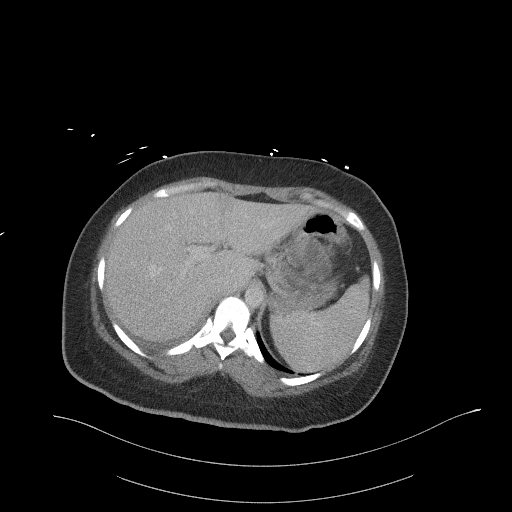
[im 83/95  soft-tissue]
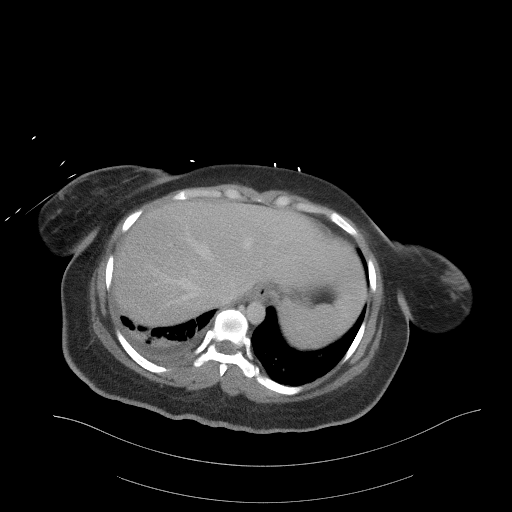
[im 91/95  soft-tissue]
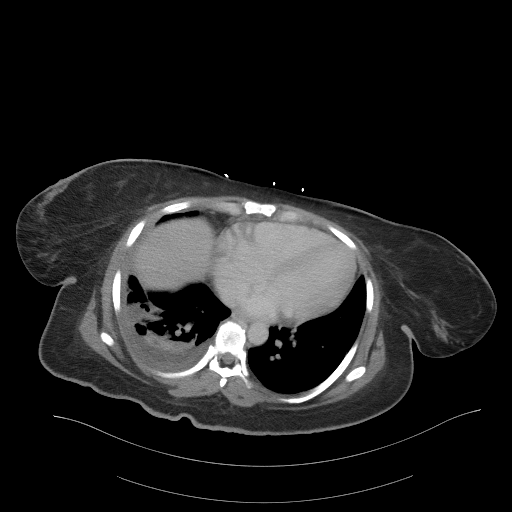

[Series 6: a/p w/ cor · coronal · 0.93mm/px · 3 of 139 slices shown]
[im 62/139  soft-tissue]
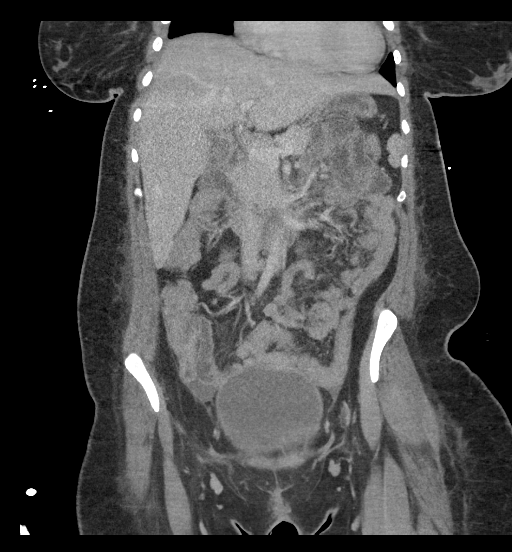
[im 77/139  soft-tissue]
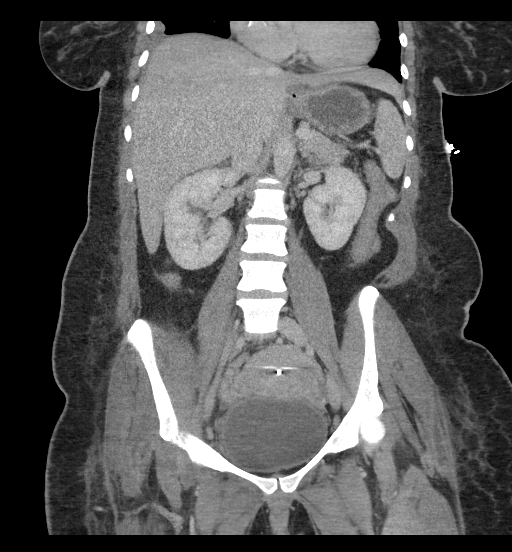
[im 93/139  soft-tissue]
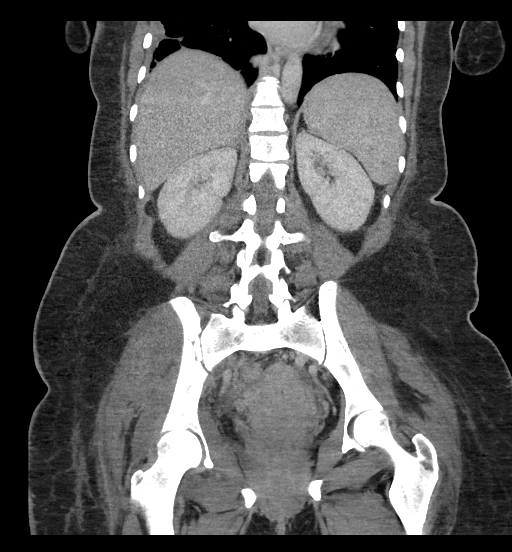

[15 of 46 positions shown; findings below may reference images not displayed]

FINDINGS: Lower chest: Small to moderate-sized right pleural effusion is new
from prior exam. There is pleural thickening in the lower right
hemithorax with mild enhancement. Previous right basilar nodules are
partially obscured by pleural fluid, the cavitary nodules are not
definitively seen.

Hepatobiliary: No focal hepatic abnormality. No calcified gallstone.
No pericholecystic fat stranding. No biliary dilatation.

Pancreas: No ductal dilatation or inflammation.

Spleen: Nonspecific 6 mm low-density in the inferior spleen. This is
retrospectively seen on prior exam. Spleen is normal in size.

Adrenals/Urinary Tract: Normal adrenal glands. Heterogeneous area of
decreased perfusion involving the posterior upper pole the left
kidney, series 3, image 27. No hydronephrosis. No visualized renal
calculi. Partially distended urinary bladder. Equivocal bladder wall
thickening about the dome.

Stomach/Bowel: The stomach is decompressed. There is no small bowel
obstruction or inflammatory change. Appendix is not visualized,
provided history of appendectomy. Majority of the colon is
nondistended. There is equivocal mild pericolonic edema, for example
in the transverse colon series 3, image 39.

Vascular/Lymphatic: Normal caliber abdominal aorta. Patent portal
vein. No portal venous or mesenteric gas. There prominent left
inguinal lymph nodes, largest measuring 9 mm short axis.

Reproductive: IUD in the pelvis short IUD in the uterus. No adnexal
mass.

Other: No free air. Small amount of free fluid in the pelvis may be
physiologic or reactive. There is mild generalized body wall edema.

Musculoskeletal: There is a thin crescentic fluid collection in the
left hip periarticular musculature, series 3, image 78. This
measures approximately 3.9 x 5 mm, series 3, image 81. There is mild
stranding about the included right anterior thigh musculature with
fatty atrophy. Low-density fluid in the right hip periarticular
musculature, series 3, image 80, patient has history of myositis.
Serpiginous lucencies and erosive changes involving the right
sacroiliac joint that are new from prior exam. No convincing
iliopsoas collection.
IMPRESSION: 1. Heterogeneous area of decreased perfusion involving the posterior
upper pole the left kidney, suspicious for pyelonephritis.
2. Thin crescentic fluid collection in the left hip periarticular
musculature measuring 3.9 x 5 mm, suspicious for abscess. Small
amount of ill-defined fluid in the right hip periarticular
musculature.
3. Serpiginous lucencies and erosive changes involving the right
sacroiliac joint that are new from prior exam, suspicious for septic
arthritis.
4. Small to moderate-sized right pleural effusion is new from prior
exam. There is pleural thickening in the lower right hemithorax with
mild enhancement. Previous right basilar nodules, some of which are
cavitary, are obscured by pleural effusion on the current exam.
5. Equivocal mild bladder wall thickening about the dome.
6. Prominent left inguinal lymph nodes are likely reactive.

## 2020-12-03 IMAGING — CR DG FEMUR 2+V*R*
4 series · 4 of 4 positions shown · non-contrast
Comparison: None.

CLINICAL DATA: Myositis.

EXAM:
RIGHT FEMUR 2 VIEWS

[femur ap (1 of 2)]
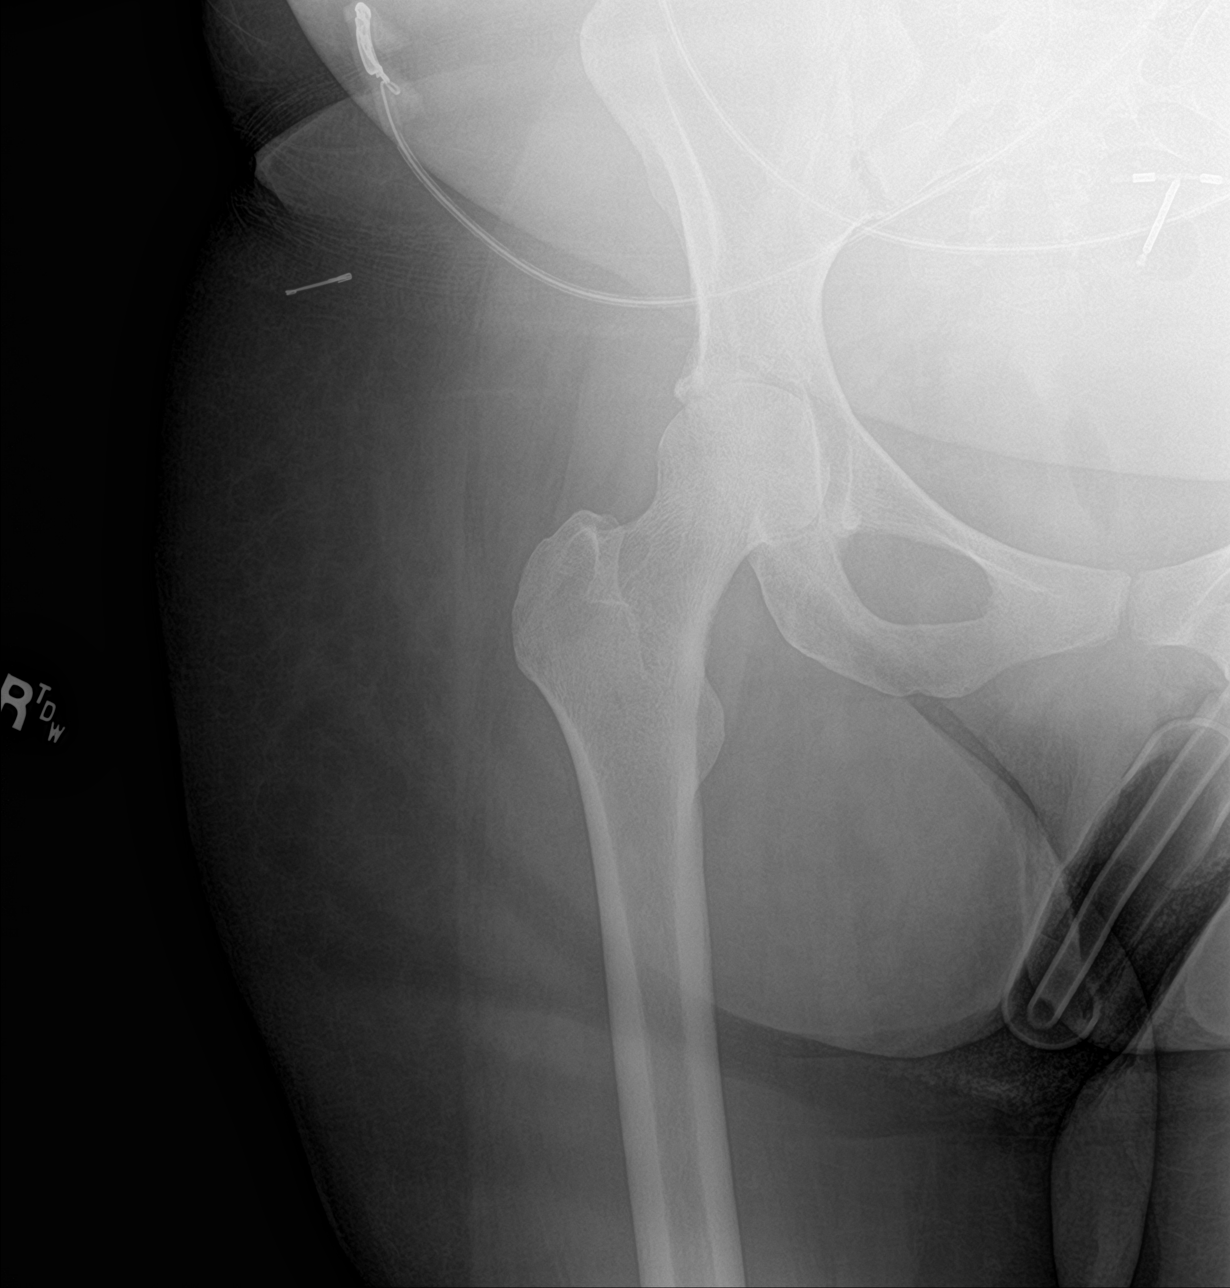

[femur ap (2 of 2)]
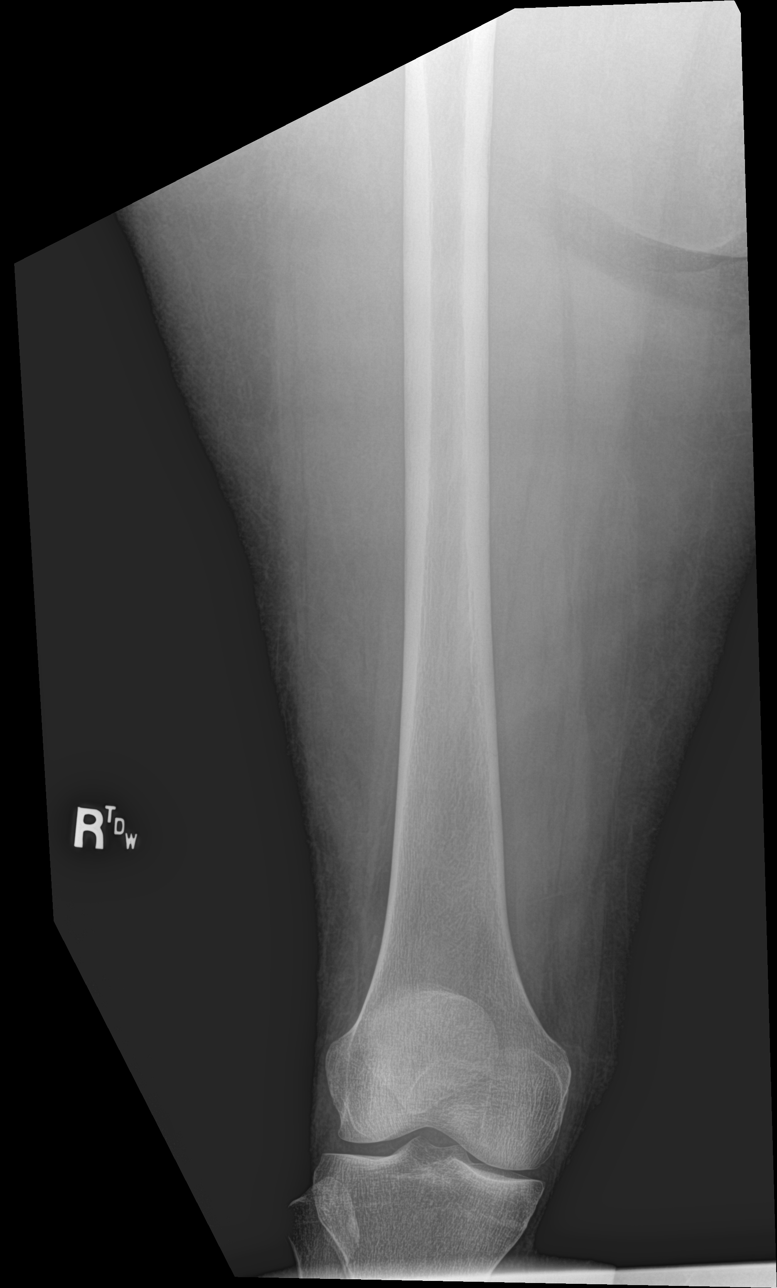

[femur lat (1 of 2)]
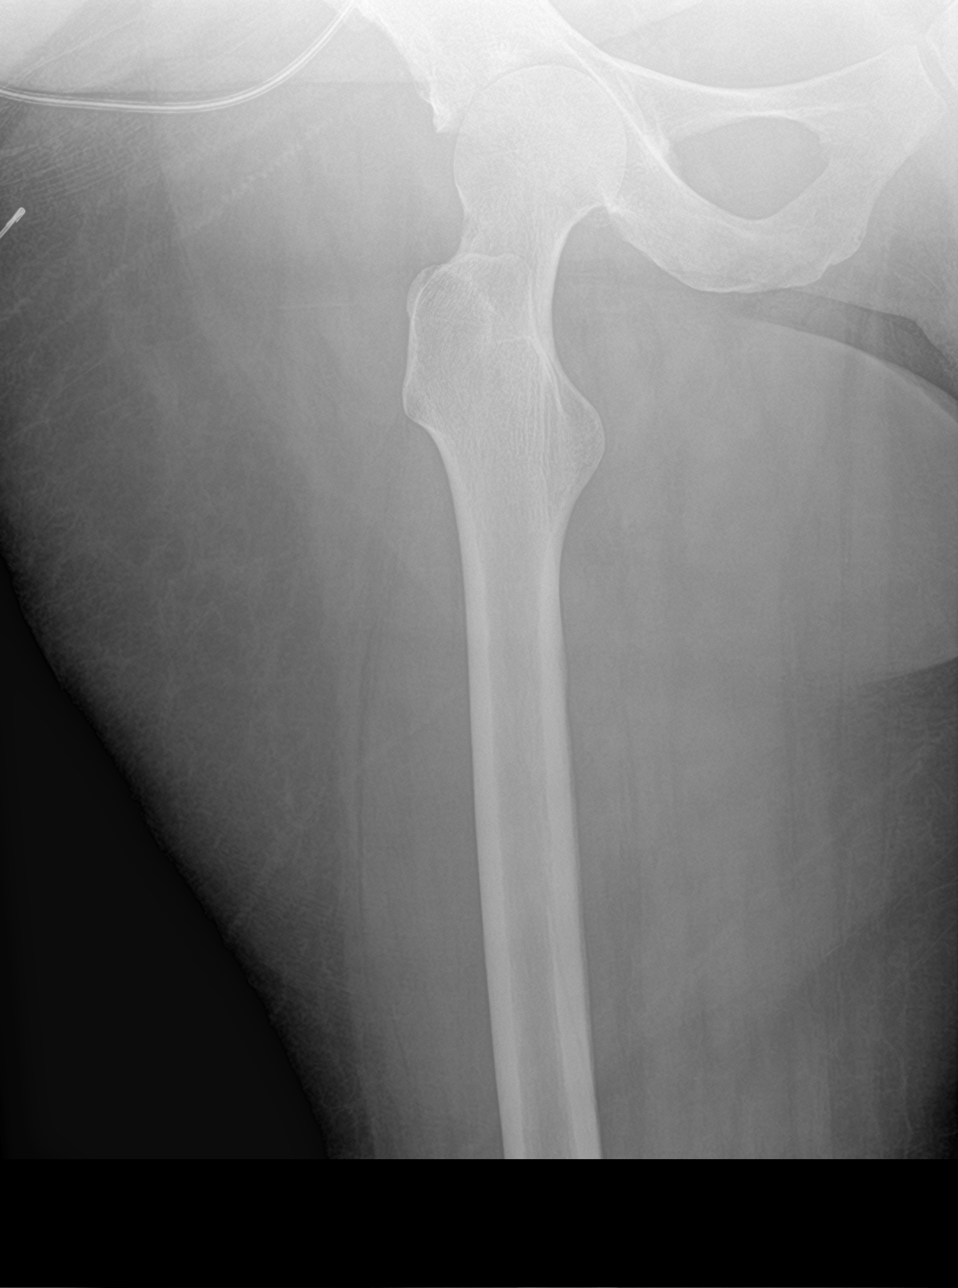

[femur lat (2 of 2)]
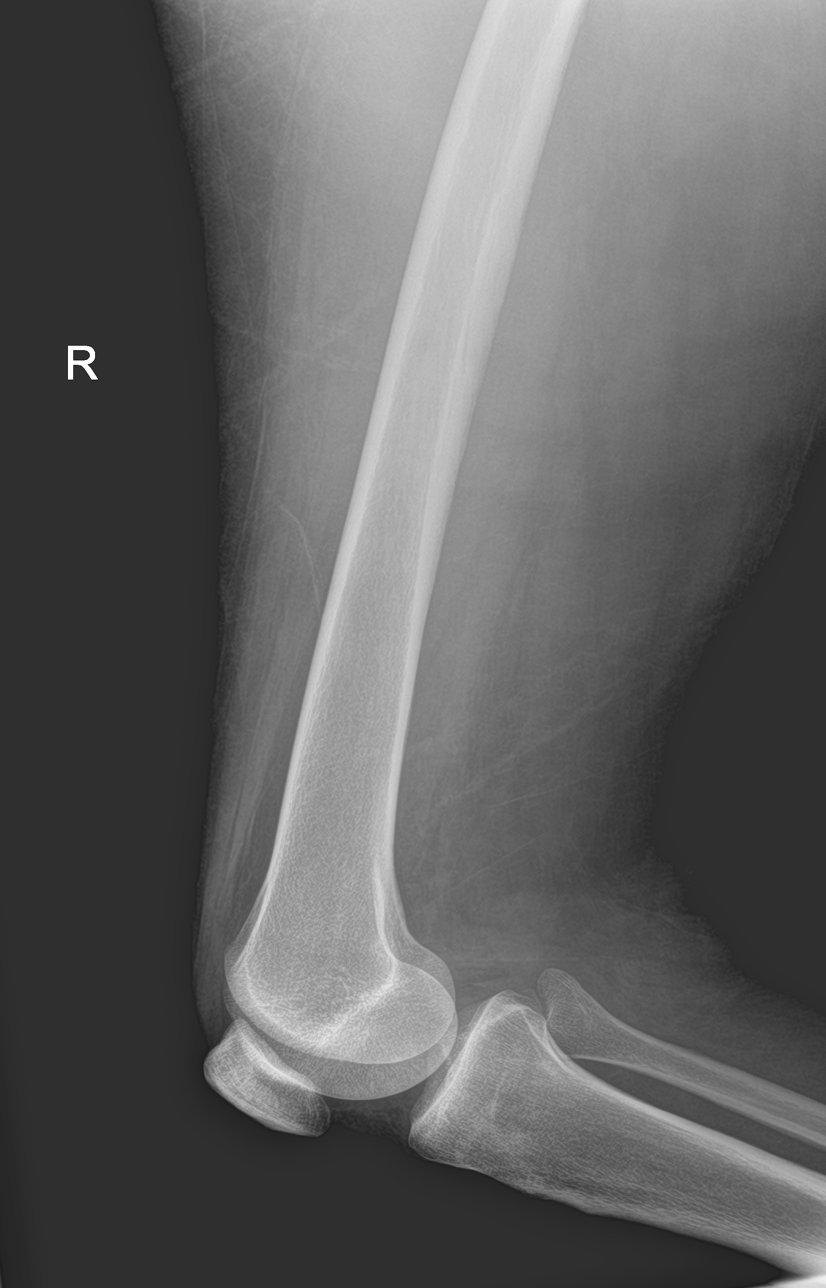

[4 of 4 positions shown; findings below may reference images not displayed]

FINDINGS: There is no evidence of fracture or other focal bone lesions. Soft
tissues are unremarkable. No gas is present within the soft tissues.
IMPRESSION: Negative.

## 2020-12-03 MED ORDER — DICLOFENAC SODIUM 1 % EX GEL
2.0000 g | Freq: Four times a day (QID) | CUTANEOUS | Status: DC
  Administered 2020-12-04 – 2020-12-11 (×22): 2 g via TOPICAL
  Filled 2020-12-03: qty 100

## 2020-12-03 MED ORDER — MIDAZOLAM HCL 2 MG/2ML IJ SOLN
2.0000 mg | Freq: Once | INTRAMUSCULAR | Status: AC
  Administered 2020-12-03: 2 mg via INTRAVENOUS
  Filled 2020-12-03: qty 2

## 2020-12-03 MED ORDER — METHOCARBAMOL 750 MG PO TABS
750.0000 mg | ORAL_TABLET | Freq: Four times a day (QID) | ORAL | Status: DC | PRN
  Administered 2020-12-04 – 2020-12-07 (×2): 750 mg via ORAL
  Filled 2020-12-03 (×3): qty 1

## 2020-12-03 MED ORDER — ADULT MULTIVITAMIN W/MINERALS CH
1.0000 | ORAL_TABLET | Freq: Every day | ORAL | Status: DC
  Administered 2020-12-04 – 2020-12-11 (×8): 1 via ORAL
  Filled 2020-12-03 (×8): qty 1

## 2020-12-03 MED ORDER — LACTATED RINGERS IV BOLUS (SEPSIS)
2000.0000 mL | Freq: Once | INTRAVENOUS | Status: AC
  Administered 2020-12-03: 2000 mL via INTRAVENOUS

## 2020-12-03 MED ORDER — POLYETHYLENE GLYCOL 3350 17 G PO PACK: 17.0000 g | PACK | Freq: Every day | ORAL | Status: DC | PRN

## 2020-12-03 MED ORDER — PANTOPRAZOLE SODIUM 40 MG IV SOLR
40.0000 mg | Freq: Once | INTRAVENOUS | Status: AC
  Administered 2020-12-03: 40 mg via INTRAVENOUS
  Filled 2020-12-03: qty 40

## 2020-12-03 MED ORDER — VANCOMYCIN HCL 1750 MG/350ML IV SOLN
1750.0000 mg | Freq: Two times a day (BID) | INTRAVENOUS | Status: DC
  Administered 2020-12-03 – 2020-12-07 (×8): 1750 mg via INTRAVENOUS
  Filled 2020-12-03 (×8): qty 350

## 2020-12-03 MED ORDER — ONDANSETRON HCL 4 MG/2ML IJ SOLN
4.0000 mg | Freq: Once | INTRAMUSCULAR | Status: AC
  Administered 2020-12-03: 4 mg via INTRAVENOUS
  Filled 2020-12-03: qty 2

## 2020-12-03 MED ORDER — INSULIN ASPART 100 UNIT/ML IJ SOLN
0.0000 [IU] | INTRAMUSCULAR | Status: DC
Start: 2020-12-03 — End: 2020-12-11
  Administered 2020-12-03 – 2020-12-04 (×4): 2 [IU] via SUBCUTANEOUS
  Administered 2020-12-04 – 2020-12-11 (×6): 1 [IU] via SUBCUTANEOUS

## 2020-12-03 MED ORDER — KETOROLAC TROMETHAMINE 30 MG/ML IJ SOLN: 30.0000 mg | Freq: Four times a day (QID) | INTRAMUSCULAR | Status: DC | PRN

## 2020-12-03 MED ORDER — ACETAMINOPHEN 325 MG PO TABS: 650.0000 mg | ORAL_TABLET | Freq: Four times a day (QID) | ORAL | Status: DC | PRN

## 2020-12-03 MED ORDER — ONDANSETRON HCL 4 MG/2ML IJ SOLN
4.0000 mg | Freq: Four times a day (QID) | INTRAMUSCULAR | Status: DC | PRN
  Administered 2020-12-03 – 2020-12-09 (×10): 4 mg via INTRAVENOUS
  Filled 2020-12-03 (×11): qty 2

## 2020-12-03 MED ORDER — VITAMIN B-6 50 MG PO TABS
50.0000 mg | ORAL_TABLET | Freq: Every day | ORAL | Status: DC
  Administered 2020-12-04 – 2020-12-11 (×7): 50 mg via ORAL
  Filled 2020-12-03 (×8): qty 1

## 2020-12-03 MED ORDER — SODIUM CHLORIDE 0.9 % IV SOLN
12.5000 mg | Freq: Four times a day (QID) | INTRAVENOUS | Status: DC | PRN
  Administered 2020-12-03 – 2020-12-06 (×4): 12.5 mg via INTRAVENOUS
  Filled 2020-12-03 (×5): qty 0.5

## 2020-12-03 MED ORDER — SODIUM CHLORIDE 0.9% FLUSH: 10.0000 mL | INTRAVENOUS | Status: DC | PRN

## 2020-12-03 MED ORDER — OXYCODONE-ACETAMINOPHEN 5-325 MG PO TABS: 1.0000 | ORAL_TABLET | ORAL | Status: DC | PRN

## 2020-12-03 MED ORDER — LACTATED RINGERS IV SOLN: INTRAVENOUS | Status: DC

## 2020-12-03 MED ORDER — GABAPENTIN 300 MG PO CAPS
300.0000 mg | ORAL_CAPSULE | Freq: Three times a day (TID) | ORAL | Status: DC
  Administered 2020-12-05 – 2020-12-11 (×17): 300 mg via ORAL
  Filled 2020-12-03 (×21): qty 1

## 2020-12-03 MED ORDER — FENTANYL CITRATE PF 50 MCG/ML IJ SOSY
50.0000 ug | PREFILLED_SYRINGE | Freq: Once | INTRAMUSCULAR | Status: AC
  Administered 2020-12-03: 50 ug via INTRAVENOUS
  Filled 2020-12-03: qty 1

## 2020-12-03 MED ORDER — OXYCODONE HCL ER 10 MG PO T12A: 10.0000 mg | EXTENDED_RELEASE_TABLET | Freq: Two times a day (BID) | ORAL | Status: DC

## 2020-12-03 MED ORDER — HYDROXYZINE HCL 25 MG PO TABS
25.0000 mg | ORAL_TABLET | Freq: Two times a day (BID) | ORAL | Status: DC | PRN
  Administered 2020-12-04 – 2020-12-10 (×6): 25 mg via ORAL
  Filled 2020-12-03 (×7): qty 1

## 2020-12-03 MED ORDER — CHLORHEXIDINE GLUCONATE CLOTH 2 % EX PADS
6.0000 | MEDICATED_PAD | Freq: Every day | CUTANEOUS | Status: DC
  Administered 2020-12-04 – 2020-12-11 (×8): 6 via TOPICAL

## 2020-12-03 MED ORDER — LIDOCAINE VISCOUS HCL 2 % MT SOLN
15.0000 mL | Freq: Once | OROMUCOSAL | Status: AC
  Administered 2020-12-03: 15 mL via ORAL
  Filled 2020-12-03: qty 15

## 2020-12-03 MED ORDER — SENNOSIDES-DOCUSATE SODIUM 8.6-50 MG PO TABS
1.0000 | ORAL_TABLET | Freq: Two times a day (BID) | ORAL | Status: DC
  Administered 2020-12-05 – 2020-12-10 (×3): 1 via ORAL
  Filled 2020-12-03 (×11): qty 1

## 2020-12-03 MED ORDER — ALUM & MAG HYDROXIDE-SIMETH 200-200-20 MG/5ML PO SUSP
30.0000 mL | Freq: Once | ORAL | Status: AC
  Administered 2020-12-03: 30 mL via ORAL
  Filled 2020-12-03: qty 30

## 2020-12-03 MED ORDER — TIZANIDINE HCL 4 MG PO TABS
2.0000 mg | ORAL_TABLET | Freq: Three times a day (TID) | ORAL | Status: DC
  Administered 2020-12-04 – 2020-12-11 (×17): 2 mg via ORAL
  Filled 2020-12-03 (×21): qty 1

## 2020-12-03 MED ORDER — ENOXAPARIN SODIUM 40 MG/0.4ML IJ SOSY
40.0000 mg | PREFILLED_SYRINGE | Freq: Every day | INTRAMUSCULAR | Status: DC
  Administered 2020-12-04 – 2020-12-10 (×8): 40 mg via SUBCUTANEOUS
  Filled 2020-12-03 (×8): qty 0.4

## 2020-12-03 MED ORDER — MORPHINE SULFATE (PF) 2 MG/ML IV SOLN
2.0000 mg | INTRAVENOUS | Status: DC | PRN
  Administered 2020-12-03 – 2020-12-04 (×2): 4 mg via INTRAVENOUS
  Administered 2020-12-04 (×3): 2 mg via INTRAVENOUS
  Administered 2020-12-04 – 2020-12-07 (×11): 4 mg via INTRAVENOUS
  Filled 2020-12-03 (×7): qty 2
  Filled 2020-12-03 (×2): qty 1
  Filled 2020-12-03: qty 2
  Filled 2020-12-03: qty 1
  Filled 2020-12-03 (×5): qty 2

## 2020-12-03 MED ORDER — HALOPERIDOL LACTATE 5 MG/ML IJ SOLN
5.0000 mg | Freq: Once | INTRAMUSCULAR | Status: AC
  Administered 2020-12-03: 5 mg via INTRAVENOUS
  Filled 2020-12-03: qty 1

## 2020-12-03 MED ORDER — SODIUM CHLORIDE 0.9 % IV SOLN
2.0000 g | Freq: Once | INTRAVENOUS | Status: AC
  Administered 2020-12-03: 2 g via INTRAVENOUS
  Filled 2020-12-03: qty 20

## 2020-12-03 MED ORDER — IOHEXOL 350 MG/ML SOLN
80.0000 mL | Freq: Once | INTRAVENOUS | Status: AC | PRN
  Administered 2020-12-03: 80 mL via INTRAVENOUS

## 2020-12-03 MED ORDER — PROPRANOLOL HCL 10 MG PO TABS
10.0000 mg | ORAL_TABLET | Freq: Every day | ORAL | Status: DC
  Administered 2020-12-04 – 2020-12-11 (×8): 10 mg via ORAL
  Filled 2020-12-03 (×8): qty 1

## 2020-12-03 MED ORDER — DEXTROSE-NACL 5-0.45 % IV SOLN: INTRAVENOUS | Status: DC

## 2020-12-03 NOTE — Progress Notes (Signed)
Pharmacy Antibiotic Note  Chelsea Mathis is a 30 y.o. female admitted on 12/03/2020 with  MRSA endocarditis, continuation of outpatient abx .  Pharmacy has been consulted for vancomycin dosing.  Plan: Resume PTA regimen of vancomycin 1750mg  IV q12h, last dose 9/3 at 0800.  Have ordered a 0900/2100 timed regimen, and a VP for 9/4 @ 1200 and VT for 9/4 @ 2030 since no true VP/VT have been obtained in a while. Cr seems to be at baseline from when discharged but just to make sure -EOT 12/19/20 per discharge note -Monitor renal function, clinical status, and antibiotic plan   Temp (24hrs), Avg:98 F (36.7 C), Min:98 F (36.7 C), Max:98 F (36.7 C)  Recent Labs  Lab 11/28/20 0315 11/28/20 1218 12/03/20 1206 12/03/20 1334  WBC 10.2  --  12.0*  --   CREATININE 0.30*  --  0.43*  --   LATICACIDVEN  --   --   --  1.9  VANCOTROUGH  --  9*  --   --   VANCOPEAK 35  --   --   --     Estimated Creatinine Clearance: 106.9 mL/min (A) (by C-G formula based on SCr of 0.43 mg/dL (L)).    No Known Allergies  Thank you for allowing pharmacy to be a part of this patient's care.  02/02/21, PharmD, [facility] Emergency Medicine Clinical Pharmacist ED RPh Phone: [phone] Main RX: [phone]

## 2020-12-03 NOTE — H&P (Signed)
History and Physical    [name] RDE:[id number] DOB: Sep 15, 1990 DOA: 12/03/2020  PCP: Pcp, No  Patient coming from: Home  I have personally briefly reviewed patient's old medical records in [place]  Chief Complaint: N/V  HPI: Chelsea Mathis is a 30 y.o. female with medical history significant of DM2.  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  Admitted to our service from 8/1-8/12 followed by stay in rehab from 8/12-8/30.  Pt presented with severe sepsis as well as DKA that admission.  Multiple blood cultures positive for MRSA, pt put on IV vancomycin which she continues at home through PICC line.  Due to myositis she also developed severe hip / pelvis pain which has been treated with Percocet PRN + Oxycontin scheduled.  She has been taking the Oxycontin since discharge though she has held the percocet due to running low on this medication.  Pt with poor activity at home since discharge.  Today pt developed intractable N/V after waking up this AM.  10+ episodes PTA.  No blood but vomiting now bilious.  Some intermittent sweats, no fevers.   ED Course: WBC 12k.  CT A/P: 1) new since 8/1 R SI joint septic arthritis. 2) small Thin crescentic fluid collection in the left hip periarticular musculature measuring 3.9 x 5 mm, suspicious for abscess. Small amount of ill-defined fluid in the right hip periarticular musculature. 3) ? L pyelonephritis  UA is unimpressive for infection (does show 80 keytones).  AG is only 12, BGL is 174 (though worth noting that insulin was apparently stopped on discharge 8/30).  VBG with pH 7.36, bicarb 22.  Lactate 1.9.  Given 2L LR bolus  Given Rocephin for possible superimposed pyelonephritis.  Vanc continued for MRSA bacteremia, endocarditis, myositis.   Review of Systems: As per HPI, otherwise all review of systems negative.  Past Medical History:  Diagnosis Date   Diabetes mellitus ([place])    Gestational  diabetes     Past Surgical History:  Procedure Laterality Date   LAPAROSCOPIC APPENDECTOMY N/A 11/11/2016   Procedure: APPENDECTOMY LAPAROSCOPIC;  Surgeon: Georganna Skeans, MD;  Location: [place];  Service: General;  Laterality: N/A;   TEE WITHOUT CARDIOVERSION N/A 11/03/2020   Procedure: TRANSESOPHAGEAL ECHOCARDIOGRAM (TEE);  Surgeon: Buford Dresser, MD;  Location: [facility] ENDOSCOPY;  Service: Cardiovascular;  Laterality: N/A;     reports that she has never smoked. She has never used smokeless tobacco. She reports that she does not drink alcohol and does not use drugs.  No Known Allergies  Family History  Problem Relation Age of Onset   Diabetes Mother    Hypertension Mother    Diabetes Maternal Uncle    Diabetes Brother    Stroke Maternal Grandmother    Leukemia Paternal Grandmother    Leukemia Paternal Grandfather      Prior to Admission medications   Medication Sig Start Date End Date Taking? Authorizing Provider  diclofenac Sodium (VOLTAREN) 1 % GEL Apply 2 g topically 4 (four) times daily. 11/28/20  Yes Angiulli, Lavon Paganini, PA-C  gabapentin (NEURONTIN) 300 MG capsule Take 1 capsule (300 mg total) by mouth 3 (three) times daily. 11/28/20  Yes Angiulli, Lavon Paganini, PA-C  hydrOXYzine (ATARAX/VISTARIL) 25 MG tablet Take 1 tablet (25 mg total) by mouth 2 (two) times daily as needed for anxiety. 11/28/20  Yes Angiulli, Lavon Paganini, PA-C  methocarbamol (ROBAXIN) 750 MG tablet Take 1 tablet (750 mg total) by mouth every 6 (six) hours as needed for muscle spasms (if  zanaflex doesn't work). 11/28/20  Yes Angiulli, Lavon Paganini, PA-C  Multiple Vitamin (MULTIVITAMIN WITH MINERALS) TABS tablet Take 1 tablet by mouth daily. 11/12/20  Yes Pokhrel, Corrie Mckusick, MD  oxyCODONE (OXYCONTIN) 10 mg 12 hr tablet Take 1 tablet (10 mg total) by mouth every 12 (twelve) hours. 11/29/20  Yes Angiulli, Lavon Paganini, PA-C  oxyCODONE-acetaminophen (PERCOCET/ROXICET) 5-325 MG tablet Take 1-2 tablets by mouth every 4 (four) hours as  needed for severe pain or moderate pain. 11/28/20  Yes Angiulli, Lavon Paganini, PA-C  propranolol (INDERAL) 10 MG tablet Take 1 tablet (10 mg total) by mouth daily. 11/29/20  Yes Angiulli, Lavon Paganini, PA-C  pyridOXINE (B-6) 50 MG tablet Take 1 tablet (50 mg total) by mouth daily. 11/29/20  Yes Angiulli, Lavon Paganini, PA-C  tiZANidine (ZANAFLEX) 2 MG tablet Take 1 tablet (2 mg total) by mouth 3 (three) times daily. 11/28/20  Yes Angiulli, Lavon Paganini, PA-C  vancomycin IVPB Inject 1,750 mg into the vein every 12 (twelve) hours. Indication:  MRSA endocarditis First Dose: Yes Last Day of Therapy:  12/19/20 Labs - Sunday/Monday:  CBC/D, BMP, and vancomycin trough. Labs - Thursday:  BMP and vancomycin trough Labs - Every other week:  ESR and CRP Method of administration:Elastomeric Method of administration may be changed at the discretion of the patient and/or caregiver's ability to self-administer the medication ordered. 11/11/20 12/20/20 Yes Pokhrel, Corrie Mckusick, MD  acetaminophen (TYLENOL) 325 MG tablet Take 2 tablets (650 mg total) by mouth every 6 (six) hours as needed for mild pain, fever or headache. 11/28/20   Angiulli, Lavon Paganini, PA-C  polyethylene glycol (MIRALAX / GLYCOLAX) 17 g packet Take 17 g by mouth daily as needed for moderate constipation (give 2nd). Patient not taking: Reported on 12/03/2020 11/28/20   Angiulli, Lavon Paganini, PA-C  senna-docusate (SENOKOT-S) 8.6-50 MG tablet Take 1 tablet by mouth 2 (two) times daily. Patient not taking: Reported on 12/03/2020 11/11/20   Flora Lipps, MD    Physical Exam: Vitals:   12/03/20 2000 12/03/20 2030 12/03/20 2130 12/03/20 2200  BP: 131/84 139/86 (!) 143/70 (!) 105/44  Pulse: 67 68 63 70  Resp: (!) 37 (!) 21 (!) 28 (!) 27  Temp:      TempSrc:      SpO2: 100% 98% 99% 98%    Constitutional: NAD, calm, comfortable Eyes: PERRL, lids and conjunctivae normal ENMT: Mucous membranes are moist. Posterior pharynx clear of any exudate or lesions.Normal dentition.  Neck:  normal, supple, no masses, no thyromegaly Respiratory: clear to auscultation bilaterally, no wheezing, no crackles. Normal respiratory effort. No accessory muscle use.  Cardiovascular: Regular rate and rhythm, no murmurs / rubs / gallops. No extremity edema. 2+ pedal pulses. No carotid bruits.  Abdomen: no tenderness, no masses palpated. No hepatosplenomegaly. Bowel sounds positive.  Musculoskeletal: no clubbing / cyanosis. No joint deformity upper and lower extremities. Good ROM, no contractures. Normal muscle tone.  Skin: no rashes, lesions, ulcers. No induration Neurologic: CN 2-12 grossly intact. Sensation intact, DTR normal. Strength 5/5 in all 4.  Psychiatric: Normal judgment and insight. Alert and oriented x 3. Normal mood.    Labs on Admission: I have personally reviewed following labs and imaging studies  CBC: Recent Labs  Lab 11/28/20 0315 12/03/20 1206 12/03/20 1344  WBC 10.2 12.0*  --   NEUTROABS 7.1 10.0*  --   HGB 8.1* 10.6* 12.6  HCT 27.3* 34.9* 37.0  MCV 84.5 83.9  --   PLT 616* 508*  --    Basic Metabolic  Panel: Recent Labs  Lab 11/28/20 0315 12/03/20 1206 12/03/20 1344  NA 134* 136 140  K 3.5 3.4* 3.6  CL 102 101  --   CO2 25 23  --   GLUCOSE 121* 174*  --   BUN <5* <5*  --   CREATININE 0.30* 0.43*  --   CALCIUM 8.7* 9.1  --    GFR: Estimated Creatinine Clearance: 106.9 mL/min (A) (by C-G formula based on SCr of 0.43 mg/dL (L)). Liver Function Tests: Recent Labs  Lab 11/28/20 0315 12/03/20 1206  AST 17 16  ALT 10 11  ALKPHOS 95 110  BILITOT 0.7 1.1  PROT 7.7 9.2*  ALBUMIN 2.0* 2.7*   No results for input(s): LIPASE, AMYLASE in the last 168 hours. No results for input(s): AMMONIA in the last 168 hours. Coagulation Profile: Recent Labs  Lab 12/03/20 1206  INR 1.3*   Cardiac Enzymes: Recent Labs  Lab 12/03/20 1206  CKTOTAL 25*   BNP (last 3 results) No results for input(s): PROBNP in the last 8760 hours. HbA1C: No results for  input(s): HGBA1C in the last 72 hours. CBG: Recent Labs  Lab 11/28/20 1630 11/28/20 2110 11/29/20 0531 11/29/20 1120 12/03/20 2150  GLUCAP 135* 166* 127* 157* 178*   Lipid Profile: No results for input(s): CHOL, HDL, LDLCALC, TRIG, CHOLHDL, LDLDIRECT in the last 72 hours. Thyroid Function Tests: No results for input(s): TSH, T4TOTAL, FREET4, T3FREE, THYROIDAB in the last 72 hours. Anemia Panel: No results for input(s): VITAMINB12, FOLATE, FERRITIN, TIBC, IRON, RETICCTPCT in the last 72 hours. Urine analysis:    Component Value Date/Time   COLORURINE YELLOW 12/03/2020 1503   APPEARANCEUR CLOUDY (A) 12/03/2020 1503   LABSPEC 1.016 12/03/2020 1503   PHURINE 6.0 12/03/2020 1503   GLUCOSEU NEGATIVE 12/03/2020 1503   HGBUR NEGATIVE 12/03/2020 1503   BILIRUBINUR NEGATIVE 12/03/2020 1503   KETONESUR 80 (A) 12/03/2020 1503   PROTEINUR 30 (A) 12/03/2020 1503   NITRITE NEGATIVE 12/03/2020 1503   LEUKOCYTESUR TRACE (A) 12/03/2020 1503    Radiological Exams on Admission: CT ABDOMEN PELVIS W CONTRAST  Result Date: 12/03/2020 CLINICAL DATA:  Abdominal abscess/infection suspected Nausea/vomiting EXAM: CT ABDOMEN AND PELVIS WITH CONTRAST TECHNIQUE: Multidetector CT imaging of the abdomen and pelvis was performed using the standard protocol following bolus administration of intravenous contrast. CONTRAST:  72m OMNIPAQUE IOHEXOL 350 MG/ML SOLN COMPARISON:  Abdominopelvic CT 10/31/2020 FINDINGS: Lower chest: Small to moderate-sized right pleural effusion is new from prior exam. There is pleural thickening in the lower right hemithorax with mild enhancement. Previous right basilar nodules are partially obscured by pleural fluid, the cavitary nodules are not definitively seen. Hepatobiliary: No focal hepatic abnormality. No calcified gallstone. No pericholecystic fat stranding. No biliary dilatation. Pancreas: No ductal dilatation or inflammation. Spleen: Nonspecific 6 mm low-density in the inferior  spleen. This is retrospectively seen on prior exam. Spleen is normal in size. Adrenals/Urinary Tract: Normal adrenal glands. Heterogeneous area of decreased perfusion involving the posterior upper pole the left kidney, series 3, image 27. No hydronephrosis. No visualized renal calculi. Partially distended urinary bladder. Equivocal bladder wall thickening about the dome. Stomach/Bowel: The stomach is decompressed. There is no small bowel obstruction or inflammatory change. Appendix is not visualized, provided history of appendectomy. Majority of the colon is nondistended. There is equivocal mild pericolonic edema, for example in the transverse colon series 3, image 39. Vascular/Lymphatic: Normal caliber abdominal aorta. Patent portal vein. No portal venous or mesenteric gas. There prominent left inguinal lymph nodes, largest measuring  9 mm short axis. Reproductive: IUD in the pelvis short IUD in the uterus. No adnexal mass. Other: No free air. Small amount of free fluid in the pelvis may be physiologic or reactive. There is mild generalized body wall edema. Musculoskeletal: There is a thin crescentic fluid collection in the left hip periarticular musculature, series 3, image 78. This measures approximately 3.9 x 5 mm, series 3, image 81. There is mild stranding about the included right anterior thigh musculature with fatty atrophy. Low-density fluid in the right hip periarticular musculature, series 3, image 80, patient has history of myositis. Serpiginous lucencies and erosive changes involving the right sacroiliac joint that are new from prior exam. No convincing iliopsoas collection. IMPRESSION: 1. Heterogeneous area of decreased perfusion involving the posterior upper pole the left kidney, suspicious for pyelonephritis. 2. Thin crescentic fluid collection in the left hip periarticular musculature measuring 3.9 x 5 mm, suspicious for abscess. Small amount of ill-defined fluid in the right hip periarticular  musculature. 3. Serpiginous lucencies and erosive changes involving the right sacroiliac joint that are new from prior exam, suspicious for septic arthritis. 4. Small to moderate-sized right pleural effusion is new from prior exam. There is pleural thickening in the lower right hemithorax with mild enhancement. Previous right basilar nodules, some of which are cavitary, are obscured by pleural effusion on the current exam. 5. Equivocal mild bladder wall thickening about the dome. 6. Prominent left inguinal lymph nodes are likely reactive. Electronically Signed   By: Keith Rake M.D.   On: 12/03/2020 19:37   DG Femur Min 2 Views Right  Result Date: 12/03/2020 CLINICAL DATA:  Myositis. EXAM: RIGHT FEMUR 2 VIEWS COMPARISON:  None. FINDINGS: There is no evidence of fracture or other focal bone lesions. Soft tissues are unremarkable. No gas is present within the soft tissues. IMPRESSION: Negative. Electronically Signed   By: San Morelle M.D.   On: 12/03/2020 17:11    EKG: Independently reviewed.  Assessment/Plan Principal Problem:   Intractable nausea and vomiting Active Problems:   Bacteremia due to Staphylococcus aureus   DM2 (diabetes mellitus, type 2) (HCC)   Myositis   Acute bacterial endocarditis   Septic arthritis of sacroiliac joint (HCC)   Chronic pain syndrome    Intractable N/V - PRN nausea meds Not entirely clear on cause, DDx includes: Opiate withdrawal, (though was still taking oxycontin so seems less likely). Occult pyelonephritis or other new bacteria with the SI joint septic arthritis (beyond MRSA currently being treated by vanc). Occult DKA due to stopping insulin on discharge (though BGL only 173, has 80 keytones in urine, no AG). PRN zofran / phenergan for nausea NPO except sips with meds IVF: LR at 100 MRSA bacteremia, endocarditis, and myositis - Cont Vanc per pharm consult Septic arthritis of SI joint - IR going to eval in AM and decide if this needs to  be tapped Ortho called by EDP but didn't sound impressed, didn't feel this was surgical. Question of Pyelonephritis - Seen on CT, but UA unimpressive Got 1 dose rocephin in ED Does have h/o ESBL at OSH in June Will hold off on further ABx for the moment (beyond continuing vancomycin) pending UCx for now. Repeat BCx also ordered Repeat CBC in AM DM2 with Question of DKA - No AG Nl pH Bgl only 173 Does have 80 keytones in urine Overall DKA seems un-likely but will check BHB to be sure For the moment will just put pt on sensitive scale SSI Q4H. CPS -  Due to not tolerating POs: Holding home PRN and long acting Oxy Use PRN morphine instead  DVT prophylaxis: Lovenox Code Status: Full Family Communication: Husband on phone Disposition Plan: Place in obs Consults called: IR and ortho surg consulted by EDP, IR is going to see patient in AM. Admission status: Place in obs    Khelani Kops, [place] [facility]  How to contact the [facility] Attending or Consulting provider [place] or covering provider during after hours [place], for this patient?  Check the care team in [facility] and look for a) attending/consulting [facility] provider listed and b) the [facility] team listed Log into [email or web address]  Amion Physician Scheduling and messaging for groups and whole hospitals  On call and physician scheduling software for group practices, residents, hospitalists and other medical providers for call, clinic, rotation and shift schedules. On[employer] is a hospital-wide system for scheduling doctors and paging doctors on call. EasyPlot is for scientific plotting and data analysis.  [email or web address]  and use [place]'s universal password to access. If you do not have the password, please contact the hospital operator.  Locate the [facility] provider you are looking for under [facility] and page to a number that you can be directly reached. If you still have difficulty reaching the provider, please page the [facility]  (Director on Call) for the Hospitalists listed on amion for assistance.  12/03/2020, 11:16 PM

## 2020-12-03 NOTE — ED Triage Notes (Signed)
Per EMS pt recent infection to R hip with MRSA. PICC line with vanc and robaxin.  Pt has been vomiting since 0830 this morning. Low intake. AxO x4 per EMS. Per EMS pt has had a decline in health. Unable to get around as normal with a walker.  VS 129/98 BP 82 HR  157 CBG 97.3 T

## 2020-12-03 NOTE — ED Provider Notes (Signed)
[facility] EMERGENCY DEPARTMENT Provider Note   CSN: [id number] Arrival date & time: 12/03/20  1102     History No chief complaint on file.   Chelsea Mathis is a 30 y.o. female.  HPI     30 year old female comes in with chief complaint of vomiting.  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  She also had infected myositis.  Patient reports that she has been taking antibiotics as prescribed.  This morning, after waking up she started having several bouts of emesis.  She had 10+ emesis prior to ED arrival, they are bilious now.  She does not have any abdominal pain, but does have some intermittent cramping in that region.  She denies any diarrhea.  Review of system is positive for some intermittent sweats, she denies any fevers.  Patient has had chronic pain in the hip now.  Later on in the patient's stay, informed by her fianc that patient ran out of her pain medications 2 days ago, and her last opiate was probably yesterday.  Patient also has history of DKA, diabetes, hyponatremia.  Past Medical History:  Diagnosis Date   Diabetes mellitus ([place])    Gestational diabetes     Patient Active Problem List   Diagnosis Date Noted   Labile blood glucose    Acute blood loss anemia    Leukocytosis    Hyponatremia    Acute bacterial endocarditis    Pain of right thigh    Adjustment disorder with mixed anxiety and depressed mood 11/10/2020   Myositis 11/04/2020   Myofasciitis 11/04/2020   Bacteremia due to Staphylococcus aureus 11/02/2020   Septic pulmonary embolism ([place]) 11/02/2020   Type 2 diabetes mellitus with hypoglycemia without coma ([place]) 11/02/2020   Severe sepsis ([place]) 11/02/2020   DKA (diabetic ketoacidosis) ([place]) 10/31/2020   Acute appendicitis 11/10/2016   Postpartum care following vaginal delivery 05/13/2015   Gestational diabetes mellitus (GDM) affecting pregnancy 05/13/2015    Past Surgical History:  Procedure  Laterality Date   LAPAROSCOPIC APPENDECTOMY N/A 11/11/2016   Procedure: APPENDECTOMY LAPAROSCOPIC;  Surgeon: Georganna Skeans, MD;  Location: [facility];  Service: General;  Laterality: N/A;   TEE WITHOUT CARDIOVERSION N/A 11/03/2020   Procedure: TRANSESOPHAGEAL ECHOCARDIOGRAM (TEE);  Surgeon: Buford Dresser, MD;  Location: [facility] ENDOSCOPY;  Service: Cardiovascular;  Laterality: N/A;     OB History     Gravida  3   Para  2   Term  2   Preterm      AB  1   Living  2      SAB      IAB      Ectopic      Multiple  0   Live Births  2           Family History  Problem Relation Age of Onset   Diabetes Mother    Hypertension Mother    Diabetes Maternal Uncle    Diabetes Brother    Stroke Maternal Grandmother    Leukemia Paternal Grandmother    Leukemia Paternal Grandfather     Social History   Tobacco Use   Smoking status: Never   Smokeless tobacco: Never  Vaping Use   Vaping Use: Never used  Substance Use Topics   Alcohol use: No    Alcohol/week: 0.0 standard drinks   Drug use: No    Home Medications Prior to Admission medications   Medication Sig Start Date End Date Taking? Authorizing Provider  diclofenac Sodium (  VOLTAREN) 1 % GEL Apply 2 g topically 4 (four) times daily. 11/28/20  Yes Angiulli, Lavon Paganini, PA-C  gabapentin (NEURONTIN) 300 MG capsule Take 1 capsule (300 mg total) by mouth 3 (three) times daily. 11/28/20  Yes Angiulli, Lavon Paganini, PA-C  hydrOXYzine (ATARAX/VISTARIL) 25 MG tablet Take 1 tablet (25 mg total) by mouth 2 (two) times daily as needed for anxiety. 11/28/20  Yes Angiulli, Lavon Paganini, PA-C  methocarbamol (ROBAXIN) 750 MG tablet Take 1 tablet (750 mg total) by mouth every 6 (six) hours as needed for muscle spasms (if zanaflex doesn't work). 11/28/20  Yes Angiulli, Lavon Paganini, PA-C  Multiple Vitamin (MULTIVITAMIN WITH MINERALS) TABS tablet Take 1 tablet by mouth daily. 11/12/20  Yes Pokhrel, Corrie Mckusick, MD  oxyCODONE (OXYCONTIN) 10 mg 12 hr tablet  Take 1 tablet (10 mg total) by mouth every 12 (twelve) hours. 11/29/20  Yes Angiulli, Lavon Paganini, PA-C  oxyCODONE-acetaminophen (PERCOCET/ROXICET) 5-325 MG tablet Take 1-2 tablets by mouth every 4 (four) hours as needed for severe pain or moderate pain. 11/28/20  Yes Angiulli, Lavon Paganini, PA-C  propranolol (INDERAL) 10 MG tablet Take 1 tablet (10 mg total) by mouth daily. 11/29/20  Yes Angiulli, Lavon Paganini, PA-C  pyridOXINE (B-6) 50 MG tablet Take 1 tablet (50 mg total) by mouth daily. 11/29/20  Yes Angiulli, Lavon Paganini, PA-C  tiZANidine (ZANAFLEX) 2 MG tablet Take 1 tablet (2 mg total) by mouth 3 (three) times daily. 11/28/20  Yes Angiulli, Lavon Paganini, PA-C  vancomycin IVPB Inject 1,750 mg into the vein every 12 (twelve) hours. Indication:  MRSA endocarditis First Dose: Yes Last Day of Therapy:  12/19/20 Labs - Sunday/Monday:  CBC/D, BMP, and vancomycin trough. Labs - Thursday:  BMP and vancomycin trough Labs - Every other week:  ESR and CRP Method of administration:Elastomeric Method of administration may be changed at the discretion of the patient and/or caregiver's ability to self-administer the medication ordered. 11/11/20 12/20/20 Yes Pokhrel, Corrie Mckusick, MD  acetaminophen (TYLENOL) 325 MG tablet Take 2 tablets (650 mg total) by mouth every 6 (six) hours as needed for mild pain, fever or headache. 11/28/20   Angiulli, Lavon Paganini, PA-C  polyethylene glycol (MIRALAX / GLYCOLAX) 17 g packet Take 17 g by mouth daily as needed for moderate constipation (give 2nd). Patient not taking: Reported on 12/03/2020 11/28/20   Angiulli, Lavon Paganini, PA-C  senna-docusate (SENOKOT-S) 8.6-50 MG tablet Take 1 tablet by mouth 2 (two) times daily. Patient not taking: Reported on 12/03/2020 11/11/20   Flora Lipps, MD    Allergies    Patient has no known allergies.  Review of Systems   Review of Systems  Constitutional:  Positive for activity change and diaphoresis.  Respiratory:  Negative for shortness of breath.   Cardiovascular:   Negative for chest pain.  Gastrointestinal:  Positive for nausea and vomiting.  Neurological:  Positive for dizziness.  All other systems reviewed and are negative.  Physical Exam Updated Vital Signs BP 139/86   Pulse 68   Temp 98 F (36.7 C) (Oral)   Resp (!) 21   SpO2 98%   Physical Exam Vitals and nursing note reviewed.  Constitutional:      Appearance: She is well-developed.  HENT:     Head: Atraumatic.  Cardiovascular:     Rate and Rhythm: Normal rate.  Pulmonary:     Effort: Pulmonary effort is normal.  Musculoskeletal:     Cervical back: Normal range of motion and neck supple.  Skin:    General: Skin  is warm and dry.  Neurological:     Mental Status: She is alert and oriented to person, place, and time.    ED Results / Procedures / Treatments   Labs (all labs ordered are listed, but only abnormal results are displayed) Labs Reviewed  COMPREHENSIVE METABOLIC PANEL - Abnormal; Notable for the following components:      Result Value   Potassium 3.4 (*)    Glucose, Bld 174 (*)    BUN <5 (*)    Creatinine, Ser 0.43 (*)    Total Protein 9.2 (*)    Albumin 2.7 (*)    All other components within normal limits  CBC WITH DIFFERENTIAL/PLATELET - Abnormal; Notable for the following components:   WBC 12.0 (*)    Hemoglobin 10.6 (*)    HCT 34.9 (*)    MCH 25.5 (*)    RDW 15.9 (*)    Platelets 508 (*)    Neutro Abs 10.0 (*)    All other components within normal limits  PROTIME-INR - Abnormal; Notable for the following components:   Prothrombin Time 15.8 (*)    INR 1.3 (*)    All other components within normal limits  URINALYSIS, ROUTINE W REFLEX MICROSCOPIC - Abnormal; Notable for the following components:   APPearance CLOUDY (*)    Ketones, ur 80 (*)    Protein, ur 30 (*)    Leukocytes,Ua TRACE (*)    Bacteria, UA RARE (*)    All other components within normal limits  CK - Abnormal; Notable for the following components:   Total CK 25 (*)    All other  components within normal limits  I-STAT VENOUS BLOOD GAS, ED - Abnormal; Notable for the following components:   pCO2, Ven 40.1 (*)    Calcium, Ion 1.12 (*)    All other components within normal limits  CULTURE, BLOOD (ROUTINE X 2)  CULTURE, BLOOD (ROUTINE X 2)  URINE CULTURE  LACTIC ACID, PLASMA  APTT  LACTIC ACID, PLASMA  BETA-HYDROXYBUTYRIC ACID  I-STAT BETA HCG BLOOD, ED ([facility], [facility], [facility] ONLY)  CBG MONITORING, ED    EKG EKG Interpretation  Date/Time:  Saturday December 03 2020 14:13:06 EDT Ventricular Rate:  77 PR Interval:  162 QRS Duration: 90 QT Interval:  443 QTC Calculation: 502 R Axis:   48 Text Interpretation: Sinus arrhythmia Probable left atrial enlargement Nonspecific T abnormalities, diffuse leads Prolonged QT interval inferior and lateral TWI is new Confirmed by Varney Biles [phone]) on 12/03/2020 2:43:58 PM  Radiology CT ABDOMEN PELVIS W CONTRAST  Result Date: 12/03/2020 CLINICAL DATA:  Abdominal abscess/infection suspected Nausea/vomiting EXAM: CT ABDOMEN AND PELVIS WITH CONTRAST TECHNIQUE: Multidetector CT imaging of the abdomen and pelvis was performed using the standard protocol following bolus administration of intravenous contrast. CONTRAST:  38m OMNIPAQUE IOHEXOL 350 MG/ML SOLN COMPARISON:  Abdominopelvic CT 10/31/2020 FINDINGS: Lower chest: Small to moderate-sized right pleural effusion is new from prior exam. There is pleural thickening in the lower right hemithorax with mild enhancement. Previous right basilar nodules are partially obscured by pleural fluid, the cavitary nodules are not definitively seen. Hepatobiliary: No focal hepatic abnormality. No calcified gallstone. No pericholecystic fat stranding. No biliary dilatation. Pancreas: No ductal dilatation or inflammation. Spleen: Nonspecific 6 mm low-density in the inferior spleen. This is retrospectively seen on prior exam. Spleen is normal in size. Adrenals/Urinary Tract: Normal adrenal glands.  Heterogeneous area of decreased perfusion involving the posterior upper pole the left kidney, series 3, image 27. No hydronephrosis. No visualized  renal calculi. Partially distended urinary bladder. Equivocal bladder wall thickening about the dome. Stomach/Bowel: The stomach is decompressed. There is no small bowel obstruction or inflammatory change. Appendix is not visualized, provided history of appendectomy. Majority of the colon is nondistended. There is equivocal mild pericolonic edema, for example in the transverse colon series 3, image 39. Vascular/Lymphatic: Normal caliber abdominal aorta. Patent portal vein. No portal venous or mesenteric gas. There prominent left inguinal lymph nodes, largest measuring 9 mm short axis. Reproductive: IUD in the pelvis short IUD in the uterus. No adnexal mass. Other: No free air. Small amount of free fluid in the pelvis may be physiologic or reactive. There is mild generalized body wall edema. Musculoskeletal: There is a thin crescentic fluid collection in the left hip periarticular musculature, series 3, image 78. This measures approximately 3.9 x 5 mm, series 3, image 81. There is mild stranding about the included right anterior thigh musculature with fatty atrophy. Low-density fluid in the right hip periarticular musculature, series 3, image 80, patient has history of myositis. Serpiginous lucencies and erosive changes involving the right sacroiliac joint that are new from prior exam. No convincing iliopsoas collection. IMPRESSION: 1. Heterogeneous area of decreased perfusion involving the posterior upper pole the left kidney, suspicious for pyelonephritis. 2. Thin crescentic fluid collection in the left hip periarticular musculature measuring 3.9 x 5 mm, suspicious for abscess. Small amount of ill-defined fluid in the right hip periarticular musculature. 3. Serpiginous lucencies and erosive changes involving the right sacroiliac joint that are new from prior exam,  suspicious for septic arthritis. 4. Small to moderate-sized right pleural effusion is new from prior exam. There is pleural thickening in the lower right hemithorax with mild enhancement. Previous right basilar nodules, some of which are cavitary, are obscured by pleural effusion on the current exam. 5. Equivocal mild bladder wall thickening about the dome. 6. Prominent left inguinal lymph nodes are likely reactive. Electronically Signed   By: Keith Rake M.D.   On: 12/03/2020 19:37   DG Femur Min 2 Views Right  Result Date: 12/03/2020 CLINICAL DATA:  Myositis. EXAM: RIGHT FEMUR 2 VIEWS COMPARISON:  None. FINDINGS: There is no evidence of fracture or other focal bone lesions. Soft tissues are unremarkable. No gas is present within the soft tissues. IMPRESSION: Negative. Electronically Signed   By: San Morelle M.D.   On: 12/03/2020 17:11    Procedures .Critical Care  Date/Time: 12/03/2020 9:20 PM Performed by: Varney Biles, MD Authorized by: Varney Biles, MD   Critical care provider statement:    Critical care time (minutes):  105   Critical care was necessary to treat or prevent imminent or life-threatening deterioration of the following conditions:  Sepsis and circulatory failure   Critical care was time spent personally by me on the following activities:  Discussions with consultants, evaluation of patient's response to treatment, examination of patient, ordering and performing treatments and interventions, ordering and review of laboratory studies, ordering and review of radiographic studies, pulse oximetry, re-evaluation of patient's condition, obtaining history from patient or surrogate and review of old charts   Medications Ordered in ED Medications  promethazine (PHENERGAN) 12.5 mg in sodium chloride 0.9 % 50 mL IVPB (0 mg Intravenous Stopped 12/03/20 1727)  vancomycin (VANCOREADY) IVPB 1750 mg/350 mL (has no administration in time range)  polyethylene glycol (MIRALAX /  GLYCOLAX) packet 17 g (has no administration in time range)  senna-docusate (Senokot-S) tablet 1 tablet (has no administration in time range)  oxyCODONE-acetaminophen (PERCOCET/ROXICET) 5-325  MG per tablet 1-2 tablet (has no administration in time range)  oxyCODONE (OXYCONTIN) 12 hr tablet 10 mg (has no administration in time range)  methocarbamol (ROBAXIN) tablet 750 mg (has no administration in time range)  multivitamin with minerals tablet 1 tablet (has no administration in time range)  hydrOXYzine (ATARAX/VISTARIL) tablet 25 mg (has no administration in time range)  gabapentin (NEURONTIN) capsule 300 mg (has no administration in time range)  acetaminophen (TYLENOL) tablet 650 mg (has no administration in time range)  propranolol (INDERAL) tablet 10 mg (has no administration in time range)  pyridOXINE (VITAMIN B-6) tablet 50 mg (has no administration in time range)  tiZANidine (ZANAFLEX) tablet 2 mg (has no administration in time range)  diclofenac Sodium (VOLTAREN) 1 % topical gel 2 g (has no administration in time range)  insulin aspart (novoLOG) injection 0-9 Units (has no administration in time range)  lactated ringers bolus 2,000 mL (0 mLs Intravenous Stopped 12/03/20 1516)  ondansetron (ZOFRAN) injection 4 mg (4 mg Intravenous Given 12/03/20 1229)  fentaNYL (SUBLIMAZE) injection 50 mcg (50 mcg Intravenous Given 12/03/20 1229)  ondansetron (ZOFRAN) injection 4 mg (4 mg Intravenous Given 12/03/20 1415)  alum & mag hydroxide-simeth (MAALOX/MYLANTA) 200-200-20 MG/5ML suspension 30 mL (30 mLs Oral Given 12/03/20 1556)    And  lidocaine (XYLOCAINE) 2 % viscous mouth solution 15 mL (15 mLs Oral Given 12/03/20 1556)  pantoprazole (PROTONIX) injection 40 mg (40 mg Intravenous Given 12/03/20 1556)  midazolam (VERSED) injection 2 mg (2 mg Intravenous Given 12/03/20 1556)  haloperidol lactate (HALDOL) injection 5 mg (5 mg Intravenous Given 12/03/20 1727)  iohexol (OMNIPAQUE) 350 MG/ML injection 80 mL (80 mLs  Intravenous Contrast Given 12/03/20 1920)  cefTRIAXone (ROCEPHIN) 2 g in sodium chloride 0.9 % 100 mL IVPB (0 g Intravenous Stopped 12/03/20 2110)    ED Course  I have reviewed the triage vital signs and the nursing notes.  Pertinent labs & imaging results that were available during my care of the patient were reviewed by me and considered in my medical decision making (see chart for details).  Clinical Course as of 12/03/20 2120  Sat Dec 03, 2020  1803 Multiple efforts and reassessment to control her nausea.  Now she is going to receive Haldol.  Will reassess, if she continues to have nausea and vomiting then we will proceed with admission for intractable nausea and vomiting. [AN]  2101 CT ABDOMEN PELVIS W CONTRAST CT shows concerns for pyelonephritis.  Additionally, there is a very small area over the left hip musculature that is concerning for abscess and SI joint septic arthritis.  Spoke with interventional radiology team, Dr. Kathlene Cote will see the patient tomorrow.  Spoke with Dr. Lyla Glassing, orthopedic service unlikely to have any further recommendation.  They are not going to come formally on consult.  ID also consulted.  I am adding ceftriaxone to patient's regimen. Discussed case with pharmacist, they will give her her usual dose of vancomycin. [AN]    Clinical Course User Index [AN] Varney Biles, MD   MDM Rules/Calculators/A&P                           30 year old comes in a chief complaint of nausea and vomiting.  On exam, patient is noted to have diaphoresis and appears uncomfortable.  She recently was admitted to the hospital for bacteremia and endocarditis.  She is getting her medication as prescribed.  She has history of diabetes and had hyponatremia at the  time of admission.  Differential diagnosis includes metabolic derangement, AKI, uremia, DKA.  We initiated work-up based on the differential.  Later on, it is becoming more clear that patient also might be having  possibly opiate withdrawals.  I reassessed the patient at 4 PM. Her labs are reassuring at that time besides ketonuria. She has already received 2 rounds of IV Zofran but continues to have nausea.  We will give her IV Versed, Protonix, GI cocktail and D5 half-normal saline bolus and reassess.  Final Clinical Impression(s) / ED Diagnoses Final diagnoses:  Pyelonephritis  Intractable nausea and vomiting  Septic arthritis of vertebra, due to unspecified organism [facility])    Rx / [place] Orders ED Discharge Orders     None        Varney Biles, MD 12/03/20 2121

## 2020-12-04 DIAGNOSIS — J90 Pleural effusion, not elsewhere classified: Secondary | ICD-10-CM | POA: Diagnosis present

## 2020-12-04 DIAGNOSIS — E876 Hypokalemia: Secondary | ICD-10-CM | POA: Diagnosis present

## 2020-12-04 DIAGNOSIS — Z8632 Personal history of gestational diabetes: Secondary | ICD-10-CM | POA: Diagnosis not present

## 2020-12-04 DIAGNOSIS — Z806 Family history of leukemia: Secondary | ICD-10-CM | POA: Diagnosis not present

## 2020-12-04 DIAGNOSIS — R6520 Severe sepsis without septic shock: Secondary | ICD-10-CM | POA: Diagnosis present

## 2020-12-04 DIAGNOSIS — E119 Type 2 diabetes mellitus without complications: Secondary | ICD-10-CM | POA: Diagnosis present

## 2020-12-04 DIAGNOSIS — Z20822 Contact with and (suspected) exposure to covid-19: Secondary | ICD-10-CM | POA: Diagnosis present

## 2020-12-04 DIAGNOSIS — M25552 Pain in left hip: Secondary | ICD-10-CM | POA: Diagnosis present

## 2020-12-04 DIAGNOSIS — R112 Nausea with vomiting, unspecified: Secondary | ICD-10-CM | POA: Diagnosis present

## 2020-12-04 DIAGNOSIS — L02416 Cutaneous abscess of left lower limb: Secondary | ICD-10-CM | POA: Diagnosis present

## 2020-12-04 DIAGNOSIS — Z823 Family history of stroke: Secondary | ICD-10-CM | POA: Diagnosis not present

## 2020-12-04 DIAGNOSIS — E669 Obesity, unspecified: Secondary | ICD-10-CM | POA: Diagnosis present

## 2020-12-04 DIAGNOSIS — N12 Tubulo-interstitial nephritis, not specified as acute or chronic: Secondary | ICD-10-CM | POA: Diagnosis not present

## 2020-12-04 DIAGNOSIS — R7881 Bacteremia: Secondary | ICD-10-CM | POA: Diagnosis not present

## 2020-12-04 DIAGNOSIS — M609 Myositis, unspecified: Secondary | ICD-10-CM | POA: Diagnosis present

## 2020-12-04 DIAGNOSIS — I330 Acute and subacute infective endocarditis: Secondary | ICD-10-CM | POA: Diagnosis present

## 2020-12-04 DIAGNOSIS — Z79899 Other long term (current) drug therapy: Secondary | ICD-10-CM | POA: Diagnosis not present

## 2020-12-04 DIAGNOSIS — I2690 Septic pulmonary embolism without acute cor pulmonale: Secondary | ICD-10-CM | POA: Diagnosis present

## 2020-12-04 DIAGNOSIS — A4102 Sepsis due to Methicillin resistant Staphylococcus aureus: Secondary | ICD-10-CM | POA: Diagnosis present

## 2020-12-04 DIAGNOSIS — Z8249 Family history of ischemic heart disease and other diseases of the circulatory system: Secondary | ICD-10-CM | POA: Diagnosis not present

## 2020-12-04 DIAGNOSIS — Z833 Family history of diabetes mellitus: Secondary | ICD-10-CM | POA: Diagnosis not present

## 2020-12-04 DIAGNOSIS — M25551 Pain in right hip: Secondary | ICD-10-CM | POA: Diagnosis present

## 2020-12-04 DIAGNOSIS — M009 Pyogenic arthritis, unspecified: Secondary | ICD-10-CM | POA: Diagnosis present

## 2020-12-04 DIAGNOSIS — G894 Chronic pain syndrome: Secondary | ICD-10-CM | POA: Diagnosis present

## 2020-12-04 LAB — VANCOMYCIN, TROUGH: Vancomycin Tr: 10 ug/mL — ABNORMAL LOW (ref 15–20)

## 2020-12-04 LAB — CBC
HCT: 27.9 % — ABNORMAL LOW (ref 36.0–46.0)
Hemoglobin: 8.7 g/dL — ABNORMAL LOW (ref 12.0–15.0)
MCH: 25.4 pg — ABNORMAL LOW (ref 26.0–34.0)
MCHC: 31.2 g/dL (ref 30.0–36.0)
MCV: 81.6 fL (ref 80.0–100.0)
Platelets: 536 10*3/uL — ABNORMAL HIGH (ref 150–400)
RBC: 3.42 MIL/uL — ABNORMAL LOW (ref 3.87–5.11)
RDW: 15.9 % — ABNORMAL HIGH (ref 11.5–15.5)
WBC: 13.3 10*3/uL — ABNORMAL HIGH (ref 4.0–10.5)
nRBC: 0 % (ref 0.0–0.2)

## 2020-12-04 LAB — GLUCOSE, CAPILLARY
Glucose-Capillary: 158 mg/dL — ABNORMAL HIGH (ref 70–99)
Glucose-Capillary: 154 mg/dL — ABNORMAL HIGH (ref 70–99)
Glucose-Capillary: 110 mg/dL — ABNORMAL HIGH (ref 70–99)
Glucose-Capillary: 152 mg/dL — ABNORMAL HIGH (ref 70–99)

## 2020-12-04 LAB — VANCOMYCIN, PEAK: Vancomycin Pk: 32 ug/mL (ref 30–40)

## 2020-12-04 LAB — COMPREHENSIVE METABOLIC PANEL
ALT: 8 U/L (ref 0–44)
AST: 15 U/L (ref 15–41)
Albumin: 2.3 g/dL — ABNORMAL LOW (ref 3.5–5.0)
Alkaline Phosphatase: 82 U/L (ref 38–126)
Anion gap: 12 (ref 5–15)
BUN: 3 mg/dL — ABNORMAL LOW (ref 6–20)
CO2: 21 mmol/L — ABNORMAL LOW (ref 22–32)
Calcium: 8.8 mg/dL — ABNORMAL LOW (ref 8.9–10.3)
Chloride: 100 mmol/L (ref 98–111)
Creatinine, Ser: 0.4 mg/dL — ABNORMAL LOW (ref 0.44–1.00)
GFR, Estimated: >60 mL/min (ref >60)
Glucose, Bld: 155 mg/dL — ABNORMAL HIGH (ref 70–99)
Potassium: 3 mmol/L — ABNORMAL LOW (ref 3.5–5.1)
Sodium: 133 mmol/L — ABNORMAL LOW (ref 135–145)
Total Bilirubin: 1 mg/dL (ref 0.3–1.2)
Total Protein: 7.7 g/dL (ref 6.5–8.1)

## 2020-12-04 LAB — MRSA NEXT GEN BY PCR, NASAL: MRSA by PCR Next Gen: NOT DETECTED

## 2020-12-04 LAB — C-REACTIVE PROTEIN: CRP: 4.7 mg/dL — ABNORMAL HIGH (ref <1.0)

## 2020-12-04 LAB — SARS CORONAVIRUS 2 (TAT 6-24 HRS): SARS Coronavirus 2: NEGATIVE

## 2020-12-04 LAB — SEDIMENTATION RATE: Sed Rate: 85 mm/hr — ABNORMAL HIGH (ref 0–22)

## 2020-12-04 LAB — CBG MONITORING, ED: Glucose-Capillary: 142 mg/dL — ABNORMAL HIGH (ref 70–99)

## 2020-12-04 MED ORDER — POTASSIUM CHLORIDE CRYS ER 20 MEQ PO TBCR
40.0000 meq | EXTENDED_RELEASE_TABLET | Freq: Once | ORAL | Status: AC
  Administered 2020-12-04: 40 meq via ORAL
  Filled 2020-12-04: qty 2

## 2020-12-04 MED ORDER — INSULIN GLARGINE-YFGN 100 UNIT/ML ~~LOC~~ SOLN
15.0000 [IU] | Freq: Every day | SUBCUTANEOUS | Status: DC
  Administered 2020-12-04 – 2020-12-11 (×7): 15 [IU] via SUBCUTANEOUS
  Filled 2020-12-04 (×8): qty 0.15

## 2020-12-04 MED ORDER — ENSURE ENLIVE PO LIQD
237.0000 mL | Freq: Two times a day (BID) | ORAL | Status: DC
  Administered 2020-12-04: 237 mL via ORAL

## 2020-12-04 NOTE — Progress Notes (Signed)
Pharmacy Antibiotic Note  Chelsea Mathis is a 30 y.o. female admitted on 12/03/2020 with  MRSA endocarditis, continuation of outpatient abx .  Pharmacy has been consulted for vancomycin dosing. Vancomycin peak 32 vancomycin trough 10 AUC 500 at goal (400-550)   Plan: Continue Vancomycin 1750mg  IV q12h  -EOT 12/19/20 per discharge note -Monitor renal function, clinical status, and antibiotic plan   Temp (24hrs), Avg:98.3 F (36.8 C), Min:97.9 F (36.6 C), Max:98.9 F (37.2 C)  Recent Labs  Lab 11/28/20 0315 11/28/20 1218 12/03/20 1206 12/03/20 1334 12/03/20 2310 12/04/20 0554 12/04/20 1200 12/04/20 2030  WBC 10.2  --  12.0*  --   --  13.3*  --   --   CREATININE 0.30*  --  0.43*  --   --  0.40*  --   --   LATICACIDVEN  --   --   --  1.9 0.9  --   --   --   VANCOTROUGH  --  9*  --   --   --   --   --  10*  VANCOPEAK 35  --   --   --   --   --  32  --      Estimated Creatinine Clearance: 106.9 mL/min (A) (by C-G formula based on SCr of 0.4 mg/dL (L)).    No Known Allergies   02/03/21 Pharm.D. CPP, BCPS Clinical Pharmacist [phone] 12/04/2020 10:51 PM

## 2020-12-04 NOTE — Plan of Care (Signed)

## 2020-12-04 NOTE — Progress Notes (Addendum)
PROGRESS NOTE    Chelsea Mathis  BWI:[id number] DOB: 06-07-1990 DOA: 12/03/2020 PCP: Pcp, No   Brief Narrative:  Wiletta Bermingham is a 30 y.o. female with medical history significant of DM2. 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  Admitted to our service from 8/1-8/12 followed by stay in rehab from 8/12-8/30. Pt presented with severe sepsis as well as DKA that admission. Multiple blood cultures positive for MRSA, pt put on IV vancomycin which she continues at home through PICC line. Due to myositis she also developed severe hip / pelvis pain which has been treated with Percocet PRN + Oxycontin scheduled. She has been taking the Oxycontin since discharge though she has held the percocet due to running low on this medication.  This time patient presented with intractable N/V after waking up this AM.  10+ episodes PTA.  No blood but vomiting now bilious.  ED Course: Upon arrival to ED, she was hemodynamically stable with WBC 12k.   CT A/P: 1) new since 8/1 R SI joint septic arthritis. 2) small Thin crescentic fluid collection in the left hip periarticular musculature measuring 3.9 x 5 mm, suspicious for abscess. Small amount of ill-defined fluid in the right hip periarticular musculature. 3) ? L pyelonephritis   UA is unimpressive for infection (does show 80 keytones). AG is only 12, BGL is 174 (though worth noting that insulin was apparently stopped on discharge 8/30). VBG with pH 7.36, bicarb 22. Lactate 1.9. Given 2L LR bolus. Given Rocephin for possible superimposed pyelonephritis.Vanc continued for MRSA bacteremia, endocarditis, myositis.    Assessment & Plan:   Principal Problem:   Intractable nausea and vomiting Active Problems:   Bacteremia due to Staphylococcus aureus   DM2 (diabetes mellitus, type 2) (HCC)   Myositis   Acute bacterial endocarditis   Septic arthritis of sacroiliac joint (HCC)   Chronic pain syndrome  Intractable nausea and vomiting: Could very well  be due to infection itself.  Still nauseous but no vomiting.  Continue IV fluids, as needed antiemetics.  Remains n.p.o. for possible intervention by IR.  MRSA bacteremia, endocarditis and myositis: Continue vancomycin:  Sepsis secondary to septic arthritis of Right SI joint/left hip septic arthritis and possible abscess, POA: Patient met sepsis criteria based on tachypnea and leukocytosis.  IR has been consulted by ED yesterday.  ID has been consulted as well.  Continue current antibiotics.  Reportedly, Dr. Lyla Glassing of Ortho was also called yesterday but he was not impressed and did not have any recommendations.  We will reconsult them if IR would request that.  Continue current pain medications.  Possible left pyelonephritis: Patient states that she did have some burning urination prior to coming, CT abdomen suspicious for possible left pyelonephritis.  UA has trace leukoesterase but no nitrites or WBC.  I do think that she probably has pyelonephritis.  She did receive 1 dose of Rocephin in the ED which has been on hold.  Now that we have ID, I will defer to them about antibiotic decision.  Hypokalemia: 3.4.  Will replace.  Type 2 diabetes mellitus: Patient did not meet any criteria of DKA.  Recent hemoglobin A1c on 10/31/2020 was 12.3.  She was discharged on 24 units of Lantus from rehab.  She is only on SSI here.  Blood sugar fairly controlled, likely due to her being n.p.o.  We will start her on 15 units of Lantus and continue SSI.  DVT prophylaxis: enoxaparin (LOVENOX) injection 40 mg Start: 12/03/20 2330   Code  Status: Full Code  Family Communication:  None present at bedside.  Plan of care discussed with patient in length and he verbalized understanding and agreed with it.  Status is: Observation  The patient will require care spanning > 2 midnights and should be moved to inpatient because: Inpatient level of care appropriate due to severity of illness  Dispo: The patient is from: Home               Anticipated d/c is to: Home              Patient currently is not medically stable to d/c.   Difficult to place patient No        Estimated body mass index is 32.47 kg/m as calculated from the following:   Height as of 11/11/20: 5' 4" (1.626 m).   Weight as of 11/29/20: 85.8 kg.     Nutritional Assessment: There is no height or weight on file to calculate BMI.. Seen by dietician.  I agree with the assessment and plan as outlined below: Nutrition Status:        .  Skin Assessment: I have examined the patient's skin and I agree with the wound assessment as performed by the wound care RN as outlined below:    Consultants:  ID IR  Procedures:  None  Antimicrobials:  Anti-infectives (From admission, onward)    Start     Dose/Rate Route Frequency Ordered Stop   12/03/20 2100  vancomycin (VANCOREADY) IVPB 1750 mg/350 mL        1,750 mg 175 mL/hr over 120 Minutes Intravenous 2 times daily 12/03/20 2049     12/03/20 2030  cefTRIAXone (ROCEPHIN) 2 g in sodium chloride 0.9 % 100 mL IVPB        2 g 200 mL/hr over 30 Minutes Intravenous  Once 12/03/20 2018 12/03/20 2110          Subjective: Seen and examined.  Still complains of some nausea but no vomiting.  Complains of right hip pain.  No pain in the left hip or no flank pain at this point in time however she did complain of some burning urination before coming to the hospital.  Objective: Vitals:   12/04/20 0100 12/04/20 0338 12/04/20 0400 12/04/20 0504  BP: (!) 140/57 128/67 139/77 139/77  Pulse: 76 83 76 76  Resp: _0 Temp:  98.5 F (36.9 C) 97.9 F (36.6 C) 97.9 F (36.6 C)  TempSrc:  Oral Oral Oral  SpO2: 98% 98% 98%     Intake/Output Summary (Last 24 hours) at 12/04/2020 0805 Last data filed at 12/03/2020 1516 Gross per 24 hour  Intake 2000 ml  Output --  Net 2000 ml   There were no vitals filed for this visit.  Examination:  General exam: Appears calm and comfortable   Respiratory system: Clear to auscultation. Respiratory effort normal. Cardiovascular system: S1 & S2 heard, RRR. No JVD, murmurs, rubs, gallops or clicks. No pedal edema. Gastrointestinal system: Abdomen is nondistended, soft and nontender. No organomegaly or masses felt. Normal bowel sounds heard. Central nervous system: Alert and oriented. No focal neurological deficits. Extremities: Symmetric 5 x 5 power.  Tenderness on the lateral side of the right hip. Skin: No rashes, lesions or ulcers Psychiatry: Judgement and insight appear normal. Mood & affect appropriate.    Data Reviewed: I have personally reviewed following labs and imaging studies  CBC: Recent Labs  Lab 11/28/20 0315 12/03/20 1206 12/03/20 1344 12/04/20 0554  WBC 10.2 12.0*  --  13.3*  NEUTROABS 7.1 10.0*  --   --   HGB 8.1* 10.6* 12.6 8.7*  HCT 27.3* 34.9* 37.0 27.9*  MCV 84.5 83.9  --  81.6  PLT 616* 508*  --  829*   Basic Metabolic Panel: Recent Labs  Lab 11/28/20 0315 12/03/20 1206 12/03/20 1344  NA 134* 136 140  K 3.5 3.4* 3.6  CL 102 101  --   CO2 25 23  --   GLUCOSE 121* 174*  --   BUN <5* <5*  --   CREATININE 0.30* 0.43*  --   CALCIUM 8.7* 9.1  --    GFR: Estimated Creatinine Clearance: 106.9 mL/min (A) (by C-G formula based on SCr of 0.43 mg/dL (L)). Liver Function Tests: Recent Labs  Lab 11/28/20 0315 12/03/20 1206  AST 17 16  ALT 10 11  ALKPHOS 95 110  BILITOT 0.7 1.1  PROT 7.7 9.2*  ALBUMIN 2.0* 2.7*   No results for input(s): LIPASE, AMYLASE in the last 168 hours. No results for input(s): AMMONIA in the last 168 hours. Coagulation Profile: Recent Labs  Lab 12/03/20 1206  INR 1.3*   Cardiac Enzymes: Recent Labs  Lab 12/03/20 1206  CKTOTAL 25*   BNP (last 3 results) No results for input(s): PROBNP in the last 8760 hours. HbA1C: No results for input(s): HGBA1C in the last 72 hours. CBG: Recent Labs  Lab 11/28/20 2110 11/29/20 0531 11/29/20 1120 12/03/20 2150  12/04/20 0334  GLUCAP 166* 127* 157* 178* 142*   Lipid Profile: No results for input(s): CHOL, HDL, LDLCALC, TRIG, CHOLHDL, LDLDIRECT in the last 72 hours. Thyroid Function Tests: No results for input(s): TSH, T4TOTAL, FREET4, T3FREE, THYROIDAB in the last 72 hours. Anemia Panel: No results for input(s): VITAMINB12, FOLATE, FERRITIN, TIBC, IRON, RETICCTPCT in the last 72 hours. Sepsis Labs: Recent Labs  Lab 12/03/20 1334 12/03/20 2310  LATICACIDVEN 1.9 0.9    Recent Results (from the past 240 hour(s))  SARS CORONAVIRUS 2 (TAT 6-24 HRS) Nasopharyngeal Nasopharyngeal Swab     Status: None   Collection Time: 12/03/20  9:47 PM   Specimen: Nasopharyngeal Swab  Result Value Ref Range Status   SARS Coronavirus 2 NEGATIVE NEGATIVE Final    Comment: (NOTE) SARS-CoV-2 target nucleic acids are NOT DETECTED.  The SARS-CoV-2 RNA is generally detectable in upper and lower respiratory specimens during the acute phase of infection. Negative results do not preclude SARS-CoV-2 infection, do not rule out co-infections with other pathogens, and should not be used as the sole basis for treatment or other patient management decisions. Negative results must be combined with clinical observations, patient history, and epidemiological information. The expected result is Negative.  Fact Sheet for Patients: SugarRoll.be  Fact Sheet for Healthcare Providers: https://www.woods-mathews.com/  This test is not yet approved or cleared by the [place] FDA and  has been authorized for detection and/or diagnosis of SARS-CoV-2 by FDA under an Emergency Use Authorization (EUA). This EUA will remain  in effect (meaning this test can be used) for the duration of the COVID-19 declaration under Se ction 564(b)(1) of the Act, 21 U.S.C. section 360bbb-3(b)(1), unless the authorization is terminated or revoked sooner.  Performed at [facility], [place]  [address] [place], [place] [address]       Radiology Studies: CT ABDOMEN PELVIS W CONTRAST  Result Date: 12/03/2020 CLINICAL DATA:  Abdominal abscess/infection suspected Nausea/vomiting EXAM: CT ABDOMEN AND PELVIS WITH CONTRAST TECHNIQUE: Multidetector CT imaging of the  abdomen and pelvis was performed using the standard protocol following bolus administration of intravenous contrast. CONTRAST:  77m OMNIPAQUE IOHEXOL 350 MG/ML SOLN COMPARISON:  Abdominopelvic CT 10/31/2020 FINDINGS: Lower chest: Small to moderate-sized right pleural effusion is new from prior exam. There is pleural thickening in the lower right hemithorax with mild enhancement. Previous right basilar nodules are partially obscured by pleural fluid, the cavitary nodules are not definitively seen. Hepatobiliary: No focal hepatic abnormality. No calcified gallstone. No pericholecystic fat stranding. No biliary dilatation. Pancreas: No ductal dilatation or inflammation. Spleen: Nonspecific 6 mm low-density in the inferior spleen. This is retrospectively seen on prior exam. Spleen is normal in size. Adrenals/Urinary Tract: Normal adrenal glands. Heterogeneous area of decreased perfusion involving the posterior upper pole the left kidney, series 3, image 27. No hydronephrosis. No visualized renal calculi. Partially distended urinary bladder. Equivocal bladder wall thickening about the dome. Stomach/Bowel: The stomach is decompressed. There is no small bowel obstruction or inflammatory change. Appendix is not visualized, provided history of appendectomy. Majority of the colon is nondistended. There is equivocal mild pericolonic edema, for example in the transverse colon series 3, image 39. Vascular/Lymphatic: Normal caliber abdominal aorta. Patent portal vein. No portal venous or mesenteric gas. There prominent left inguinal lymph nodes, largest measuring 9 mm short axis. Reproductive: IUD in the pelvis short IUD in the uterus. No adnexal mass. Other: No  free air. Small amount of free fluid in the pelvis may be physiologic or reactive. There is mild generalized body wall edema. Musculoskeletal: There is a thin crescentic fluid collection in the left hip periarticular musculature, series 3, image 78. This measures approximately 3.9 x 5 mm, series 3, image 81. There is mild stranding about the included right anterior thigh musculature with fatty atrophy. Low-density fluid in the right hip periarticular musculature, series 3, image 80, patient has history of myositis. Serpiginous lucencies and erosive changes involving the right sacroiliac joint that are new from prior exam. No convincing iliopsoas collection. IMPRESSION: 1. Heterogeneous area of decreased perfusion involving the posterior upper pole the left kidney, suspicious for pyelonephritis. 2. Thin crescentic fluid collection in the left hip periarticular musculature measuring 3.9 x 5 mm, suspicious for abscess. Small amount of ill-defined fluid in the right hip periarticular musculature. 3. Serpiginous lucencies and erosive changes involving the right sacroiliac joint that are new from prior exam, suspicious for septic arthritis. 4. Small to moderate-sized right pleural effusion is new from prior exam. There is pleural thickening in the lower right hemithorax with mild enhancement. Previous right basilar nodules, some of which are cavitary, are obscured by pleural effusion on the current exam. 5. Equivocal mild bladder wall thickening about the dome. 6. Prominent left inguinal lymph nodes are likely reactive. Electronically Signed   By: MKeith RakeM.D.   On: 12/03/2020 19:37   DG Femur Min 2 Views Right  Result Date: 12/03/2020 CLINICAL DATA:  Myositis. EXAM: RIGHT FEMUR 2 VIEWS COMPARISON:  None. FINDINGS: There is no evidence of fracture or other focal bone lesions. Soft tissues are unremarkable. No gas is present within the soft tissues. IMPRESSION: Negative. Electronically Signed   By: CSan MorelleM.D.   On: 12/03/2020 17:11    Scheduled Meds:  Chlorhexidine Gluconate Cloth  6 each Topical Daily   diclofenac Sodium  2 g Topical QID   enoxaparin (LOVENOX) injection  40 mg Subcutaneous QHS   feeding supplement  237 mL Oral BID BM   gabapentin  300 mg Oral TID  insulin aspart  0-9 Units Subcutaneous Q4H   multivitamin with minerals  1 tablet Oral Daily   potassium chloride  40 mEq Oral Once   propranolol  10 mg Oral Daily   pyridOXINE  50 mg Oral Daily   senna-docusate  1 tablet Oral BID   tiZANidine  2 mg Oral TID   Continuous Infusions:  lactated ringers 100 mL/hr at 12/04/20 0055   promethazine (PHENERGAN) injection (IM or IVPB) Stopped (12/03/20 1727)   vancomycin Stopped (12/04/20 0047)     LOS: 0 days   Time spent: 37 minutes  Darliss Cheney, MD [facility]  12/04/2020, 8:05 AM  Please page via [facility] and do not message via secure chat for anything urgent. Secure chat can be used for anything non urgent and I will respond at my earliest availability.  How to contact the [facility] Attending or Consulting provider [place] or covering provider during after hours [place], for this patient?  Check the care team in [facility] and look for a) attending/consulting [facility] provider listed and b) the [facility] team listed. Page or secure chat [phone]. Log into [email or web address] and use [place] universal password to access. If you do not have the password, please contact the hospital operator. Locate the [facility] provider you are looking for under [facility] and page to a number that you can be directly reached. If you still have difficulty reaching the provider, please page the [facility] (Director on Call) for the Hospitalists listed on amion for assistance.

## 2020-12-04 NOTE — Consult Note (Addendum)
[facility]                                                                                        Patient Identification: Patient Name: Chelsea RocherReginee Yagi MRN: [id number] Admit Date: 12/03/2020 11:02 AM Today's Date: 12/04/2020 Reason for consult: MRSA bacteremia Requesting provider: Hughie Clossavi Pahwani   Principal Problem:   Intractable nausea and vomiting Active Problems:   Bacteremia due to Staphylococcus aureus   DM2 (diabetes mellitus, type 2) (HCC)   Myositis   Acute bacterial endocarditis   Septic arthritis of sacroiliac joint (HCC)   Chronic pain syndrome   Antibiotics: Vancomycin 9/3- current                    Ceftriaxone 9/3   Lines/Tubes: Rt arm PICC line   Assessment # Left Hip periarticular abscess/ ill defined fluid in the rt periarticular musculature  # 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 Will not add gram negative coverage for now with reasons stated above Follow up blood cultures/urine cultures  Recommend to formally consult Orthopedics for concern for Rt SI joint septic arthritis in the setting of recent MRSA bacteremia and potential need for surgical intervention Monitor CBC, BMP and Vancomycin trough Discussed with patient and Primary   Rest of the management as per the primary team. Please call with questions or concerns.  Thank you for the consult  Odette FractionSabina Elner Seifert, MD Infectious Disease Physician [facility]  [facility] [address] [place], [place] [address] Phone: [phone]  Fax:  [phone]  __________________________________________________________________________________________________________ HPI and Hospital Course: 30 year old female with PMH of DM2, recent hospital admission ) 8/1-8/30/22) for MRSA TV endocarditis including Myositis/myofaciitis with associated intramuscular abscesses of Bilateral thigh being treated with IV vancomycin who presented to the ED on 9/3 with nausea and vomiting.  She woke up in the morning and started having several bouts of nonbilious nonbloody vomiting associated with abdominal cramping.  She also had intermittent sweats.  Denies any fever, chills. Denies any cough, chest pain and SOB. Denies any GU symptoms. Pain in the bilateral thigh previously reported has improved but has pain in bilateral hip areas and has difficulty with ROM. Denies any pain/tenderness and swelling at the PICC line site. She is taking IV abtx as instructed through her PICC line.   At ED, she was febrile with WBC 12.  UA unremarkable for UTI.  CT abdomen pelvis findings as below Blood cultures 9/3 no growth in less than 24 hours ID consulted for abtx recs.   ROS: General- Denies fever, chills, loss of appetite and loss of weight HEENT - Denies headache, blurry vision, neck pain, sinus pain Chest - Denies any chest pain, SOB or cough CVS- Denies any dizziness/lightheadedness, syncopal attacks, palpitations Abdomen- Denies abdominal pain, hematochezia and diarrhea, Nausea and Vomiting + Neuro -  Denies any weakness, numbness, tingling sensation Psych - Denies any changes in mood irritability or depressive symptoms GU- Denies any burning, dysuria, hematuria or increased frequency of urination Skin - denies any rashes/lesions MSK - Bilateral hip pain with associted difficulty with ROM   Past Medical History:  Diagnosis Date   Diabetes mellitus (HCC)    Gestational diabetes    Past Surgical History:  Procedure Laterality Date   LAPAROSCOPIC APPENDECTOMY  N/A 11/11/2016   Procedure: APPENDECTOMY LAPAROSCOPIC;  Surgeon: Violeta Gelinas, MD;  Location: [facility] OR;  Service: General;  Laterality: N/A;   TEE WITHOUT CARDIOVERSION N/A 11/03/2020   Procedure: TRANSESOPHAGEAL ECHOCARDIOGRAM (TEE);  Surgeon: Jodelle Red, MD;  Location: [facility] ENDOSCOPY;  Service: Cardiovascular;  Laterality: N/A;     Scheduled Meds:  Chlorhexidine Gluconate Cloth  6 each Topical Daily   diclofenac Sodium  2 g Topical QID   enoxaparin (LOVENOX) injection  40 mg Subcutaneous QHS   feeding supplement  237 mL Oral BID BM   gabapentin  300 mg Oral TID   insulin aspart  0-9 Units Subcutaneous Q4H   insulin glargine-yfgn  15 Units Subcutaneous Daily   multivitamin with minerals  1 tablet Oral Daily   propranolol  10 mg Oral Daily   pyridOXINE  50 mg Oral Daily   senna-docusate  1 tablet Oral BID   tiZANidine  2 mg Oral TID   Continuous Infusions:  lactated ringers Stopped (12/04/20 0843)   promethazine (PHENERGAN) injection (IM or IVPB) Stopped (12/03/20 1714)   vancomycin 175 mL/hr at 12/04/20 1056   PRN Meds:.acetaminophen, hydrOXYzine, methocarbamol, morphine injection, ondansetron (ZOFRAN) IV, polyethylene glycol, promethazine (PHENERGAN) injection (IM or IVPB), sodium chloride flush  No Known Allergies  Social History   Socioeconomic History   Marital status: Single    Spouse name: Not on file   Number of children: Not on file   Years of education: Not on file   Highest education level: Not on file  Occupational History   Not on file  Tobacco Use   Smoking status: Never   Smokeless tobacco: Never  Vaping Use   Vaping Use: Never used  Substance and Sexual Activity   Alcohol use: No    Alcohol/week: 0.0 standard drinks   Drug use: No   Sexual activity: Yes  Other Topics Concern   Not on file  Social History Narrative   Not on file   Social Determinants of Health   Financial Resource Strain: Not on file  Food Insecurity: Not on file   Transportation Needs: Not on file  Physical Activity: Not on file  Stress: Not on file  Social Connections: Not on file  Intimate Partner Violence: Not on file   Family History  Problem Relation Age of Onset   Diabetes Mother    Hypertension Mother    Diabetes Maternal Uncle    Diabetes Brother    Stroke Maternal Grandmother    Leukemia Paternal Grandmother    Leukemia Paternal Grandfather     Vitals BP (!) 144/79 (BP Location: Left Arm)   Pulse 90   Temp 98.3 F (36.8 C) (Oral)   Resp 18   SpO2 96%    Physical Exam Constitutional:  Sitting up in bed, not in acute distress    Comments:   Cardiovascular:     Rate and Rhythm: Normal rate and regular rhythm.     Heart sounds:  Pulmonary:     Effort: Pulmonary effort is normal.     Comments: clear  lung sounds bilaterally   Abdominal:     Palpations: Abdomen is soft.     Tenderness: Non tender and non distended, BS+  Musculoskeletal:        General: Limited ROM of bilateral hip joints due to pain   Skin:    Comments: No obvious lesions or rashes   Neurological:     General: No focal deficit present. Awake, alert and oriented   Psychiatric:        Mood and Affect: Mood normal. Calm and cooperative    Pertinent Microbiology Results for orders placed or performed during the hospital encounter of 12/03/20  Blood Culture (routine x 2)     Status: None (Preliminary result)   Collection Time: 12/03/20 12:06 PM   Specimen: BLOOD  Result Value Ref Range Status   Specimen Description BLOOD BLOOD RIGHT HAND  Final   Special Requests   Final    BOTTLES DRAWN AEROBIC AND ANAEROBIC Blood Culture results may not be optimal due to an inadequate volume of blood received in culture bottles   Culture   Final    NO GROWTH < 24 HOURS Performed at [facility], [address], [place], [place] [address]    Report Status PENDING  Incomplete  Blood Culture (routine x 2)     Status: None (Preliminary result)    Collection Time: 12/03/20 12:11 PM   Specimen: BLOOD  Result Value Ref Range Status   Specimen Description BLOOD RIGHT ANTECUBITAL  Final   Special Requests   Final    BOTTLES DRAWN AEROBIC AND ANAEROBIC Blood Culture adequate volume   Culture   Final    NO GROWTH < 24 HOURS Performed at [facility], [address] [place], [place] [address]    Report Status PENDING  Incomplete  SARS CORONAVIRUS 2 (TAT 6-24 HRS) Nasopharyngeal Nasopharyngeal Swab     Status: None   Collection Time: 12/03/20  9:47 PM   Specimen: Nasopharyngeal Swab  Result Value Ref Range Status   SARS Coronavirus 2 NEGATIVE NEGATIVE Final    Comment: (NOTE) SARS-CoV-2 target nucleic acids are NOT DETECTED.  The SARS-CoV-2 RNA is generally detectable in upper and lower respiratory specimens during the acute phase of infection. Negative results do not preclude SARS-CoV-2 infection, do not rule out co-infections with other pathogens, and should not be used as the sole basis for treatment or other patient management decisions. Negative results must be combined with clinical observations, patient history, and epidemiological information. The expected result is Negative.  Fact Sheet for Patients: HairSlick.no  Fact Sheet for Healthcare Providers: quierodirigir.com  This test is not yet approved or cleared by the [place] FDA and  has been authorized for detection and/or diagnosis of SARS-CoV-2 by FDA under an Emergency Use Authorization (EUA). This EUA will remain  in effect (meaning this test can be used) for the duration of the COVID-19 declaration under Se ction 564(b)(1) of the Act, 21 U.S.C. section 360bbb-3(b)(1), unless the authorization is terminated or revoked sooner.  Performed at [facility], [address] [place], [place] [address]   MRSA Next Gen by PCR, Nasal     Status: None   Collection Time: 12/04/20  7:47 AM    Specimen: Nasal Mucosa; Nasal Swab  Result Value Ref Range Status   MRSA by PCR Next Gen NOT DETECTED NOT DETECTED Final    Comment: (NOTE) The GeneXpert MRSA Assay (FDA approved for NASAL specimens only), is one component of  a comprehensive MRSA colonization surveillance program. It is not intended to diagnose MRSA infection nor to guide or monitor treatment for MRSA infections. Test performance is not FDA approved in patients less than 16 years old. Performed at [facility], [address], [place], [place] [address]      Pertinent Lab seen by me: CBC Latest Ref Rng & Units 12/04/2020 12/03/2020 12/03/2020  WBC 4.0 - 10.5 K/uL 13.3(H) - 12.0(H)  Hemoglobin 12.0 - 15.0 g/dL 3.1(V) 40.0 10.6(L)  Hematocrit 36.0 - 46.0 % 27.9(L) 37.0 34.9(L)  Platelets 150 - 400 K/uL 536(H) - 508(H)   CMP Latest Ref Rng & Units 12/03/2020 12/03/2020 11/28/2020  Glucose 70 - 99 mg/dL - 867(Y) 195(K)  BUN 6 - 20 mg/dL - <9(T) <2(I)  Creatinine 0.44 - 1.00 mg/dL - 7.12(W) 5.80(D)  Sodium 135 - 145 mmol/L 140 136 134(L)  Potassium 3.5 - 5.1 mmol/L 3.6 3.4(L) 3.5  Chloride 98 - 111 mmol/L - 101 102  CO2 22 - 32 mmol/L - 23 25  Calcium 8.9 - 10.3 mg/dL - 9.1 9.8(P)  Total Protein 6.5 - 8.1 g/dL - 9.2(H) 7.7  Total Bilirubin 0.3 - 1.2 mg/dL - 1.1 0.7  Alkaline Phos 38 - 126 U/L - 110 95  AST 15 - 41 U/L - 16 17  ALT 0 - 44 U/L - 11 10    Pertinent Imagings/Other Imagings Plain films and CT images have been personally visualized and interpreted; radiology reports have been reviewed. Decision making incorporated into the Impression / Recommendations.  CT abdomen/pelvis  IMPRESSION: 1. Heterogeneous area of decreased perfusion involving the posterior upper pole the left kidney, suspicious for pyelonephritis. 2. Thin crescentic fluid collection in the left hip periarticular musculature measuring 3.9 x 5 mm, suspicious for abscess. Small amount of ill-defined fluid in the right hip  periarticular musculature. 3. Serpiginous lucencies and erosive changes involving the right sacroiliac joint that are new from prior exam, suspicious for septic arthritis. 4. Small to moderate-sized right pleural effusion is new from prior exam. There is pleural thickening in the lower right hemithorax with mild enhancement. Previous right basilar nodules, some of which are cavitary, are obscured by pleural effusion on the current exam. 5. Equivocal mild bladder wall thickening about the dome. 6. Prominent left inguinal lymph nodes are likely reactive.   I spent more than 70  minutes for this patient encounter including review of prior medical records/discussing diagnostics and treatment plan with the patient/family/coordinate care with primary/other specialits with greater than 50% of time in face to face encounter.   Electronically signed by:   Odette Fraction, MD Infectious Disease Physician [facility] for Infectious Disease Pager: [phone]

## 2020-12-04 NOTE — ED Notes (Signed)
ED TO INPATIENT HANDOFF REPORT  ED Nurse Name and Phone #:  [phone]  S Name/Age/Gender Chelsea Mathis 30 y.o. female Room/Bed: 030C/030C  Code Status   Code Status: Full Code  Home/SNF/Other Home Patient oriented to: self, place situation and time Is this baseline? 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 PICC line with vanc and robaxin.  Pt has been vomiting since 0830 this morning. Low intake. AxO x4 per EMS. Per EMS pt has had a decline in health. Unable to get around as normal with a walker.  VS 129/98 BP 82 HR  157 CBG 97.3 T     Allergies No Known Allergies  Level of Care/Admitting Diagnosis ED Disposition    ED Disposition  Admit   Condition  --   Comment  Hospital Area: [facility] [[id number]]  Level of Care: Telemetry Medical [[id number]]  May place patient in observation at [facility] or [facility] if equivalent level of care is available:: No  Covid Evaluation: Asymptomatic Screening Protocol (No Symptoms)  Diagnosis: Intractable nausea and vomiting [720114]  Admitting Physician: Hillary Bow [[id number]]  Attending Physician: Hillary Bow [phone]         B Medical/Surgery History Past Medical History:  Diagnosis Date  . Diabetes mellitus (HCC)   . Gestational diabetes    Past Surgical History:  Procedure Laterality Date  . LAPAROSCOPIC APPENDECTOMY N/A 11/11/2016   Procedure: APPENDECTOMY LAPAROSCOPIC;  Surgeon: Violeta Gelinas, MD;  Location: [facility] OR;  Service: General;  Laterality: N/A;  . TEE WITHOUT CARDIOVERSION N/A 11/03/2020   Procedure: TRANSESOPHAGEAL ECHOCARDIOGRAM (TEE);  Surgeon: Jodelle Red, MD;  Location: [facility] ENDOSCOPY;  Service: Cardiovascular;  Laterality: N/A;     A IV Location/Drains/Wounds Patient Lines/Drains/Airways Status    Active Line/Drains/Airways    Name Placement date  Placement time Site Days   PICC Single Lumen 11/18/20 Right Brachial 39 cm 1 cm 11/18/20  1315  Brachial  16   Incision (Closed) 11/11/16 Abdomen Other (Comment) 11/11/16  0906  -- 1484   Incision - 3 Ports Abdomen Umbilicus Right;Lateral;Upper Left;Lower;Lateral 11/11/16  --  -- 1484          Intake/Output Last 24 hours  Intake/Output Summary (Last 24 hours) at 12/04/2020 0339 Last data filed at 12/03/2020 1516 Gross per 24 hour  Intake 2000 ml  Output --  Net 2000 ml    Labs/Imaging Results for orders placed or performed during the hospital encounter of 12/03/20 (from the past 48 hour(s))  Comprehensive metabolic panel     Status: Abnormal   Collection Time: 12/03/20 12:06 PM  Result Value Ref Range   Sodium 136 135 - 145 mmol/L   Potassium 3.4 (L) 3.5 - 5.1 mmol/L   Chloride 101 98 - 111 mmol/L   CO2 23 22 - 32 mmol/L   Glucose, Bld 174 (H) 70 - 99 mg/dL    Comment: Glucose reference range applies only to samples taken after fasting for at least 8 hours.   BUN <5 (L) 6 - 20 mg/dL   Creatinine, Ser 9.14 (L) 0.44 - 1.00 mg/dL   Calcium 9.1 8.9 - 78.2 mg/dL   Total Protein 9.2 (H) 6.5 - 8.1 g/dL   Albumin 2.7 (L) 3.5 - 5.0 g/dL   AST 16 15 - 41 U/L   ALT 11 0 - 44 U/L   Alkaline Phosphatase 110  38 - 126 U/L   Total Bilirubin 1.1 0.3 - 1.2 mg/dL   GFR, Estimated >29 >79 mL/min    Comment: (NOTE) Calculated using the CKD-EPI Creatinine Equation (2021)    Anion gap 12 5 - 15    Comment: Performed at [facility], [address], [place], [place] [address]  CBC WITH DIFFERENTIAL     Status: Abnormal   Collection Time: 12/03/20 12:06 PM  Result Value Ref Range   WBC 12.0 (H) 4.0 - 10.5 K/uL   RBC 4.16 3.87 - 5.11 MIL/uL   Hemoglobin 10.6 (L) 12.0 - 15.0 g/dL   HCT 94.1 (L) 74.0 - 81.4 %   MCV 83.9 80.0 - 100.0 fL   MCH 25.5 (L) 26.0 - 34.0 pg   MCHC 30.4 30.0 - 36.0 g/dL   RDW 48.1 (H) 85.6 - 31.4 %   Platelets 508 (H) 150 - 400 K/uL   nRBC 0.0 0.0 - 0.2 %    Neutrophils Relative % 83 %   Neutro Abs 10.0 (H) 1.7 - 7.7 K/uL   Lymphocytes Relative 14 %   Lymphs Abs 1.7 0.7 - 4.0 K/uL   Monocytes Relative 2 %   Monocytes Absolute 0.2 0.1 - 1.0 K/uL   Eosinophils Relative 0 %   Eosinophils Absolute 0.0 0.0 - 0.5 K/uL   Basophils Relative 0 %   Basophils Absolute 0.0 0.0 - 0.1 K/uL   Immature Granulocytes 1 %   Abs Immature Granulocytes 0.06 0.00 - 0.07 K/uL    Comment: Performed at [facility], [address], [place], [place] [address]  Protime-INR     Status: Abnormal   Collection Time: 12/03/20 12:06 PM  Result Value Ref Range   Prothrombin Time 15.8 (H) 11.4 - 15.2 seconds   INR 1.3 (H) 0.8 - 1.2    Comment: (NOTE) INR goal varies based on device and disease states. Performed at [facility], [address] [place], [place] [address]   APTT     Status: None   Collection Time: 12/03/20 12:06 PM  Result Value Ref Range   aPTT 35 24 - 36 seconds    Comment: Performed at [facility], [address], [place], [place] [address]  CK     Status: Abnormal   Collection Time: 12/03/20 12:06 PM  Result Value Ref Range   Total CK 25 (L) 38 - 234 U/L    Comment: Performed at [facility], [address] [place], [place] [address]  Lactic acid, plasma     Status: None   Collection Time: 12/03/20  1:34 PM  Result Value Ref Range   Lactic Acid, Venous 1.9 0.5 - 1.9 mmol/L    Comment: Performed at [facility], [address], [place], [place] [address]  I-Stat beta hCG blood, ED     Status: None   Collection Time: 12/03/20  1:42 PM  Result Value Ref Range   I-stat hCG, quantitative <5.0 <5 mIU/mL   Comment 3            Comment:   GEST. AGE      CONC.  (mIU/mL)   <=1 WEEK        5 - 50     2 WEEKS       50 - 500     3 WEEKS       100 - 10,000     4 WEEKS  1,000 - 30,000        FEMALE AND NON-PREGNANT FEMALE:     LESS THAN 5 mIU/mL   I-Stat venous blood gas, ([facility] ED)     Status: Abnormal   Collection  Time: 12/03/20  1:44 PM  Result Value Ref Range   pH, Ven 7.363 7.250 - 7.430   pCO2, Ven 40.1 (L) 44.0 - 60.0 mmHg   pO2, Ven 40.0 32.0 - 45.0 mmHg   Bicarbonate 22.8 20.0 - 28.0 mmol/L   TCO2 24 22 - 32 mmol/L   O2 Saturation 73.0 %   Acid-base deficit 2.0 0.0 - 2.0 mmol/L   Sodium 140 135 - 145 mmol/L   Potassium 3.6 3.5 - 5.1 mmol/L   Calcium, Ion 1.12 (L) 1.15 - 1.40 mmol/L   HCT 37.0 36.0 - 46.0 %   Hemoglobin 12.6 12.0 - 15.0 g/dL   Sample type VENOUS   Urinalysis, Routine w reflex microscopic Urine, Clean Catch     Status: Abnormal   Collection Time: 12/03/20  3:03 PM  Result Value Ref Range   Color, Urine YELLOW YELLOW   APPearance CLOUDY (A) CLEAR   Specific Gravity, Urine 1.016 1.005 - 1.030   pH 6.0 5.0 - 8.0   Glucose, UA NEGATIVE NEGATIVE mg/dL   Hgb urine dipstick NEGATIVE NEGATIVE   Bilirubin Urine NEGATIVE NEGATIVE   Ketones, ur 80 (A) NEGATIVE mg/dL   Protein, ur 30 (A) NEGATIVE mg/dL   Nitrite NEGATIVE NEGATIVE   Leukocytes,Ua TRACE (A) NEGATIVE   RBC / HPF 0-5 0 - 5 RBC/hpf   WBC, UA 0-5 0 - 5 WBC/hpf   Bacteria, UA RARE (A) NONE SEEN   Squamous Epithelial / LPF 0-5 0 - 5   Mucus PRESENT    Amorphous Crystal PRESENT     Comment: Performed at [facility], [address] [place], [place] [address]  SARS CORONAVIRUS 2 (TAT 6-24 HRS) Nasopharyngeal Nasopharyngeal Swab     Status: None   Collection Time: 12/03/20  9:47 PM   Specimen: Nasopharyngeal Swab  Result Value Ref Range   SARS Coronavirus 2 NEGATIVE NEGATIVE    Comment: (NOTE) SARS-CoV-2 target nucleic acids are NOT DETECTED.  The SARS-CoV-2 RNA is generally detectable in upper and lower respiratory specimens during the acute phase of infection. Negative results do not preclude SARS-CoV-2 infection, do not rule out co-infections with other pathogens, and should not be used as the sole basis for treatment or other patient management decisions. Negative results must be combined with  clinical observations, patient history, and epidemiological information. The expected result is Negative.  Fact Sheet for Patients: HairSlick.no  Fact Sheet for Healthcare Providers: quierodirigir.com  This test is not yet approved or cleared by the [place] FDA and  has been authorized for detection and/or diagnosis of SARS-CoV-2 by FDA under an Emergency Use Authorization (EUA). This EUA will remain  in effect (meaning this test can be used) for the duration of the COVID-19 declaration under Se ction 564(b)(1) of the Act, 21 U.S.C. section 360bbb-3(b)(1), unless the authorization is terminated or revoked sooner.  Performed at [facility], [address] [place], [place] [address]   POC CBG, ED     Status: Abnormal   Collection Time: 12/03/20  9:50 PM  Result Value Ref Range   Glucose-Capillary 178 (H) 70 - 99 mg/dL    Comment: Glucose reference range applies only to samples taken after fasting for at least 8 hours.  Lactic acid,  plasma     Status: None   Collection Time: 12/03/20 11:10 PM  Result Value Ref Range   Lactic Acid, Venous 0.9 0.5 - 1.9 mmol/L    Comment: Performed at [facility], [address], [place], [place] [address]  Beta-hydroxybutyric acid     Status: Abnormal   Collection Time: 12/03/20 11:12 PM  Result Value Ref Range   Beta-Hydroxybutyric Acid 1.97 (H) 0.05 - 0.27 mmol/L    Comment: Performed at [facility], [address] [place], [place] [address]  CBG monitoring, ED     Status: Abnormal   Collection Time: 12/04/20  3:34 AM  Result Value Ref Range   Glucose-Capillary 142 (H) 70 - 99 mg/dL    Comment: Glucose reference range applies only to samples taken after fasting for at least 8 hours.   CT ABDOMEN PELVIS W CONTRAST  Result Date: 12/03/2020 CLINICAL DATA:  Abdominal abscess/infection suspected Nausea/vomiting EXAM: CT ABDOMEN AND PELVIS WITH CONTRAST TECHNIQUE:  Multidetector CT imaging of the abdomen and pelvis was performed using the standard protocol following bolus administration of intravenous contrast. CONTRAST:  68mL OMNIPAQUE IOHEXOL 350 MG/ML SOLN COMPARISON:  Abdominopelvic CT 10/31/2020 FINDINGS: Lower chest: Small to moderate-sized right pleural effusion is new from prior exam. There is pleural thickening in the lower right hemithorax with mild enhancement. Previous right basilar nodules are partially obscured by pleural fluid, the cavitary nodules are not definitively seen. Hepatobiliary: No focal hepatic abnormality. No calcified gallstone. No pericholecystic fat stranding. No biliary dilatation. Pancreas: No ductal dilatation or inflammation. Spleen: Nonspecific 6 mm low-density in the inferior spleen. This is retrospectively seen on prior exam. Spleen is normal in size. Adrenals/Urinary Tract: Normal adrenal glands. Heterogeneous area of decreased perfusion involving the posterior upper pole the left kidney, series 3, image 27. No hydronephrosis. No visualized renal calculi. Partially distended urinary bladder. Equivocal bladder wall thickening about the dome. Stomach/Bowel: The stomach is decompressed. There is no small bowel obstruction or inflammatory change. Appendix is not visualized, provided history of appendectomy. Majority of the colon is nondistended. There is equivocal mild pericolonic edema, for example in the transverse colon series 3, image 39. Vascular/Lymphatic: Normal caliber abdominal aorta. Patent portal vein. No portal venous or mesenteric gas. There prominent left inguinal lymph nodes, largest measuring 9 mm short axis. Reproductive: IUD in the pelvis short IUD in the uterus. No adnexal mass. Other: No free air. Small amount of free fluid in the pelvis may be physiologic or reactive. There is mild generalized body wall edema. Musculoskeletal: There is a thin crescentic fluid collection in the left hip periarticular musculature, series 3,  image 78. This measures approximately 3.9 x 5 mm, series 3, image 81. There is mild stranding about the included right anterior thigh musculature with fatty atrophy. Low-density fluid in the right hip periarticular musculature, series 3, image 80, patient has history of myositis. Serpiginous lucencies and erosive changes involving the right sacroiliac joint that are new from prior exam. No convincing iliopsoas collection. IMPRESSION: 1. Heterogeneous area of decreased perfusion involving the posterior upper pole the left kidney, suspicious for pyelonephritis. 2. Thin crescentic fluid collection in the left hip periarticular musculature measuring 3.9 x 5 mm, suspicious for abscess. Small amount of ill-defined fluid in the right hip periarticular musculature. 3. Serpiginous lucencies and erosive changes involving the right sacroiliac joint that are new from prior exam, suspicious for septic arthritis. 4. Small to moderate-sized right pleural effusion is new from prior exam. There is  pleural thickening in the lower right hemithorax with mild enhancement. Previous right basilar nodules, some of which are cavitary, are obscured by pleural effusion on the current exam. 5. Equivocal mild bladder wall thickening about the dome. 6. Prominent left inguinal lymph nodes are likely reactive. Electronically Signed   By: Narda Rutherford M.D.   On: 12/03/2020 19:37   DG Femur Min 2 Views Right  Result Date: 12/03/2020 CLINICAL DATA:  Myositis. EXAM: RIGHT FEMUR 2 VIEWS COMPARISON:  None. FINDINGS: There is no evidence of fracture or other focal bone lesions. Soft tissues are unremarkable. No gas is present within the soft tissues. IMPRESSION: Negative. Electronically Signed   By: Marin Roberts M.D.   On: 12/03/2020 17:11    Pending Labs Unresulted Labs (From admission, onward)    Start     Ordered   12/04/20 2030  Vancomycin, trough  Once,   STAT        12/03/20 2103   12/04/20 1200  Vancomycin, peak  Once,    STAT        12/03/20 2103   12/04/20 0500  CBC  Tomorrow morning,   R        12/03/20 2315   12/04/20 0500  Comprehensive metabolic panel  Tomorrow morning,   R        12/03/20 2315   12/03/20 2054  Urine Culture  Once,   STAT       Question:  Indication  Answer:  Flank Pain   12/03/20 2053   12/03/20 1206  Blood Culture (routine x 2)  (Undifferentiated presentation (screening labs and basic nursing orders))  BLOOD CULTURE X 2,   STAT      12/03/20 1206          Vitals/Pain Today's Vitals   12/03/20 2200 12/03/20 2212 12/04/20 0100 12/04/20 0338  BP: (!) 105/44  (!) 140/57 128/67  Pulse: 70  76 83  Resp: (!) Temp:    98.5 F (36.9 C)  TempSrc:    Oral  SpO2: 98%  98% 98%  PainSc:  5       Isolation Precautions No active isolations  Medications Medications  promethazine (PHENERGAN) 12.5 mg in sodium chloride 0.9 % 50 mL IVPB (0 mg Intravenous Stopped 12/03/20 1727)  vancomycin (VANCOREADY) IVPB 1750 mg/350 mL (0 mg Intravenous Stopped 12/04/20 0047)  polyethylene glycol (MIRALAX / GLYCOLAX) packet 17 g (has no administration in time range)  senna-docusate (Senokot-S) tablet 1 tablet (has no administration in time range)  methocarbamol (ROBAXIN) tablet 750 mg (has no administration in time range)  multivitamin with minerals tablet 1 tablet (has no administration in time range)  hydrOXYzine (ATARAX/VISTARIL) tablet 25 mg (has no administration in time range)  gabapentin (NEURONTIN) capsule 300 mg (300 mg Oral Patient Refused/Not Given 12/03/20 2125)  acetaminophen (TYLENOL) tablet 650 mg (has no administration in time range)  propranolol (INDERAL) tablet 10 mg (has no administration in time range)  pyridOXINE (VITAMIN B-6) tablet 50 mg (has no administration in time range)  tiZANidine (ZANAFLEX) tablet 2 mg (2 mg Oral Patient Refused/Not Given 12/03/20 2125)  diclofenac Sodium (VOLTAREN) 1 % topical gel 2 g (2 g Topical Patient Refused/Not Given 12/03/20 2125)  insulin  aspart (novoLOG) injection 0-9 Units (2 Units Subcutaneous Given 12/03/20 2202)  ondansetron (ZOFRAN) injection 4 mg (4 mg Intravenous Given 12/03/20 2135)  morphine 2 MG/ML injection 2-4 mg (4 mg Intravenous Given 12/04/20 0142)  sodium chloride flush (NS) 0.9 %  injection 10-40 mL (has no administration in time range)  Chlorhexidine Gluconate Cloth 2 % PADS 6 each (has no administration in time range)  enoxaparin (LOVENOX) injection 40 mg (40 mg Subcutaneous Given 12/04/20 0056)  lactated ringers infusion ( Intravenous New Bag/Given 12/04/20 0055)  lactated ringers bolus 2,000 mL (0 mLs Intravenous Stopped 12/03/20 1516)  ondansetron (ZOFRAN) injection 4 mg (4 mg Intravenous Given 12/03/20 1229)  fentaNYL (SUBLIMAZE) injection 50 mcg (50 mcg Intravenous Given 12/03/20 1229)  ondansetron (ZOFRAN) injection 4 mg (4 mg Intravenous Given 12/03/20 1415)  alum & mag hydroxide-simeth (MAALOX/MYLANTA) 200-200-20 MG/5ML suspension 30 mL (30 mLs Oral Given 12/03/20 1556)    And  lidocaine (XYLOCAINE) 2 % viscous mouth solution 15 mL (15 mLs Oral Given 12/03/20 1556)  pantoprazole (PROTONIX) injection 40 mg (40 mg Intravenous Given 12/03/20 1556)  midazolam (VERSED) injection 2 mg (2 mg Intravenous Given 12/03/20 1556)  haloperidol lactate (HALDOL) injection 5 mg (5 mg Intravenous Given 12/03/20 1727)  iohexol (OMNIPAQUE) 350 MG/ML injection 80 mL (80 mLs Intravenous Contrast Given 12/03/20 1920)  cefTRIAXone (ROCEPHIN) 2 g in sodium chloride 0.9 % 100 mL IVPB (0 g Intravenous Stopped 12/03/20 2110)    Mobility  High fall risk   Focused Assessments    R Recommendations: See Admitting Provider Note  Report given to:   Additional Notes:

## 2020-12-04 NOTE — Progress Notes (Signed)
Patient ID: Chelsea Mathis, female   DOB: 28-Mar-1991, 30 y.o.   MRN: [id number] Request received for possible aspiration/drainage of left hip intramuscular fluid collection in patient.  Imaging studies were reviewed by Dr. Fredia Sorrow.  The left hip intramuscular fluid collection is too small for drain placement.  If strongly desired by ID team could conceivably aspirate the right SI joint on 9/6. Please place order if desired.

## 2020-12-05 ENCOUNTER — Inpatient Hospital Stay (HOSPITAL_COMMUNITY): Payer: Medicaid Other

## 2020-12-05 DIAGNOSIS — R112 Nausea with vomiting, unspecified: Secondary | ICD-10-CM | POA: Diagnosis not present

## 2020-12-05 LAB — URINALYSIS, COMPLETE (UACMP) WITH MICROSCOPIC
Bacteria, UA: NONE SEEN
Bilirubin Urine: NEGATIVE
Glucose, UA: NEGATIVE mg/dL
Hgb urine dipstick: NEGATIVE
Ketones, ur: 20 mg/dL — AB
Leukocytes,Ua: NEGATIVE
Nitrite: NEGATIVE
Protein, ur: NEGATIVE mg/dL
Specific Gravity, Urine: 1.011 (ref 1.005–1.030)
pH: 7 (ref 5.0–8.0)

## 2020-12-05 LAB — URINE CULTURE

## 2020-12-05 LAB — BASIC METABOLIC PANEL
Anion gap: 10 (ref 5–15)
BUN: <5 mg/dL — ABNORMAL LOW (ref 6–20)
CO2: 27 mmol/L (ref 22–32)
Calcium: 8.8 mg/dL — ABNORMAL LOW (ref 8.9–10.3)
Chloride: 99 mmol/L (ref 98–111)
Creatinine, Ser: 0.32 mg/dL — ABNORMAL LOW (ref 0.44–1.00)
GFR, Estimated: >60 mL/min (ref >60)
Glucose, Bld: 110 mg/dL — ABNORMAL HIGH (ref 70–99)
Potassium: 2.8 mmol/L — ABNORMAL LOW (ref 3.5–5.1)
Sodium: 136 mmol/L (ref 135–145)

## 2020-12-05 LAB — CBC WITH DIFFERENTIAL/PLATELET
Abs Immature Granulocytes: 0.06 10*3/uL (ref 0.00–0.07)
Basophils Absolute: 0.1 10*3/uL (ref 0.0–0.1)
Basophils Relative: 1 %
Eosinophils Absolute: 0 10*3/uL (ref 0.0–0.5)
Eosinophils Relative: 0 %
HCT: 28.5 % — ABNORMAL LOW (ref 36.0–46.0)
Hemoglobin: 8.7 g/dL — ABNORMAL LOW (ref 12.0–15.0)
Immature Granulocytes: 1 %
Lymphocytes Relative: 25 %
Lymphs Abs: 2.8 10*3/uL (ref 0.7–4.0)
MCH: 25.1 pg — ABNORMAL LOW (ref 26.0–34.0)
MCHC: 30.5 g/dL (ref 30.0–36.0)
MCV: 82.4 fL (ref 80.0–100.0)
Monocytes Absolute: 1 10*3/uL (ref 0.1–1.0)
Monocytes Relative: 9 %
Neutro Abs: 7 10*3/uL (ref 1.7–7.7)
Neutrophils Relative %: 64 %
Platelets: 525 10*3/uL — ABNORMAL HIGH (ref 150–400)
RBC: 3.46 MIL/uL — ABNORMAL LOW (ref 3.87–5.11)
RDW: 16 % — ABNORMAL HIGH (ref 11.5–15.5)
WBC: 10.9 10*3/uL — ABNORMAL HIGH (ref 4.0–10.5)
nRBC: 0 % (ref 0.0–0.2)

## 2020-12-05 LAB — GLUCOSE, CAPILLARY
Glucose-Capillary: 105 mg/dL — ABNORMAL HIGH (ref 70–99)
Glucose-Capillary: 109 mg/dL — ABNORMAL HIGH (ref 70–99)
Glucose-Capillary: 110 mg/dL — ABNORMAL HIGH (ref 70–99)
Glucose-Capillary: 123 mg/dL — ABNORMAL HIGH (ref 70–99)
Glucose-Capillary: 125 mg/dL — ABNORMAL HIGH (ref 70–99)
Glucose-Capillary: 84 mg/dL (ref 70–99)

## 2020-12-05 IMAGING — US US ABDOMEN LIMITED
1 series · 14 of 25 positions shown · non-contrast
Comparison: CT [DATE]

CLINICAL DATA: Abdominal pain.

EXAM:
ULTRASOUND ABDOMEN LIMITED RIGHT UPPER QUADRANT

[Series 1: us abdomen limited ruq (liver/gb) · 14 of 49 slices shown]
[im 1/49]
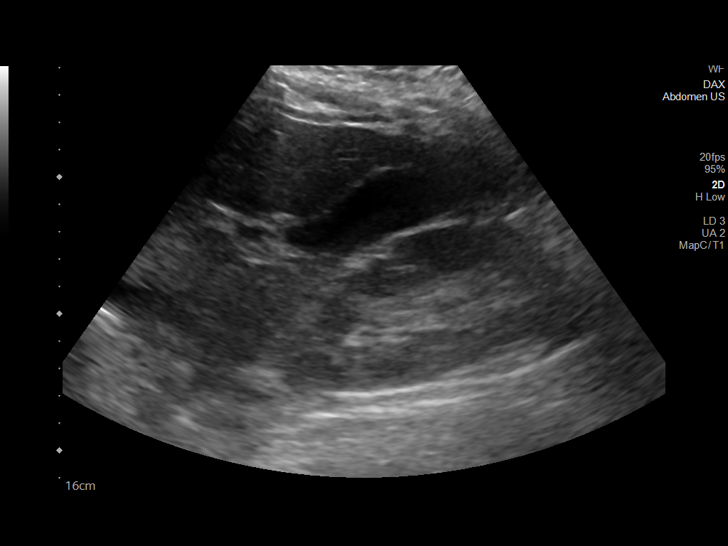
[im 5/49]
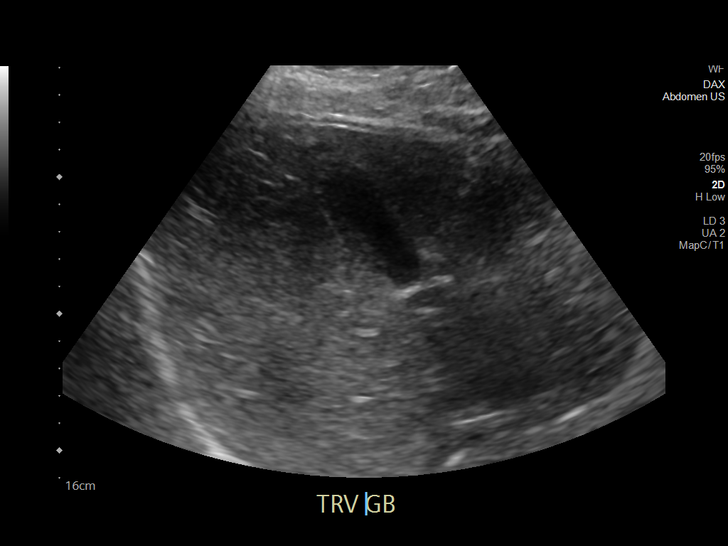
[im 9/49]
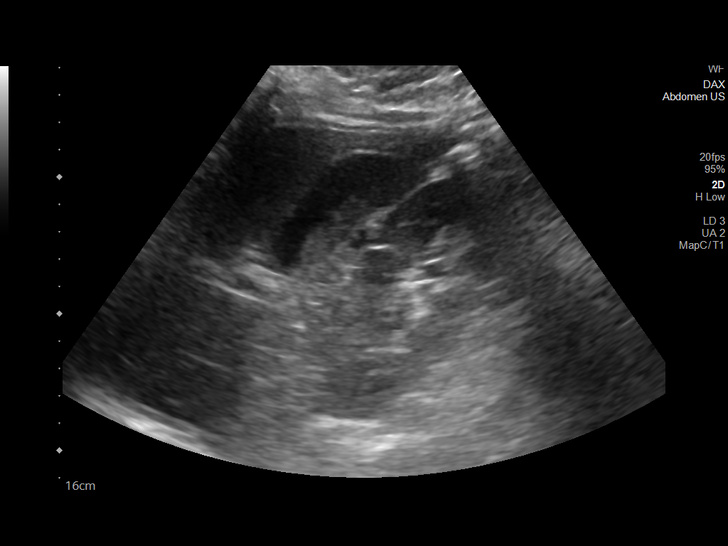
[im 13/49]
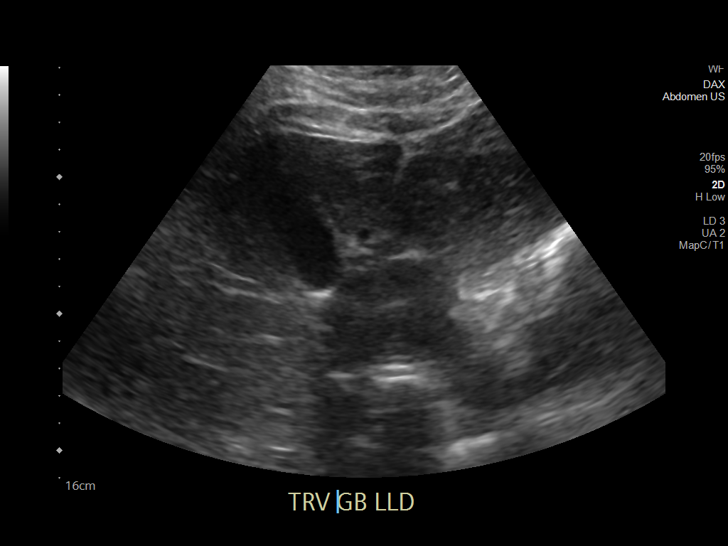
[im 17/49]
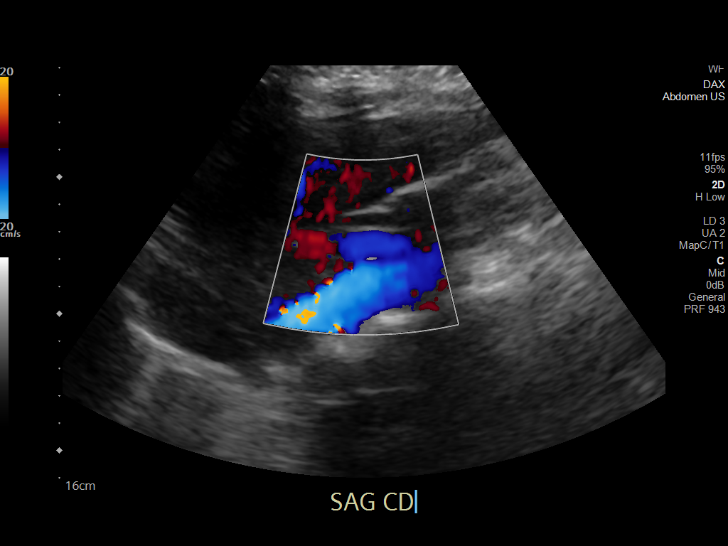
[im 19/49]
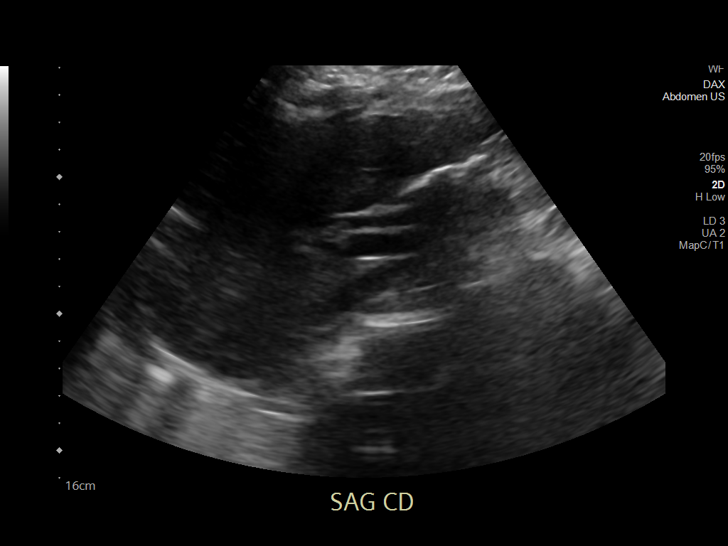
[im 23/49]
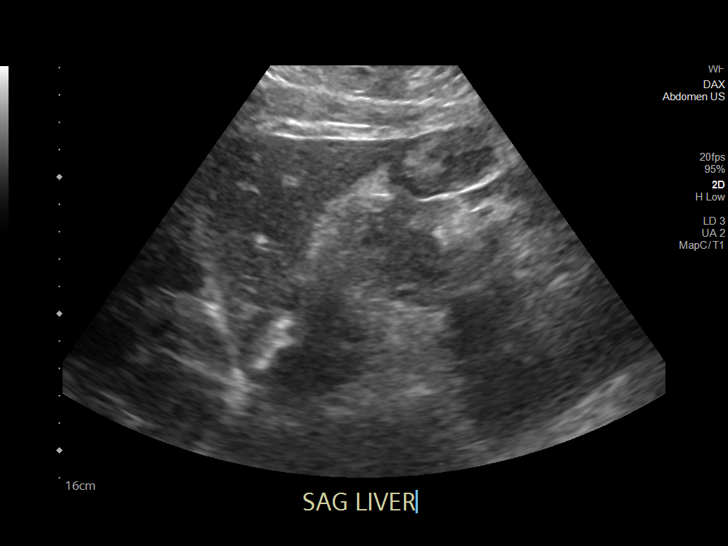
[im 27/49]
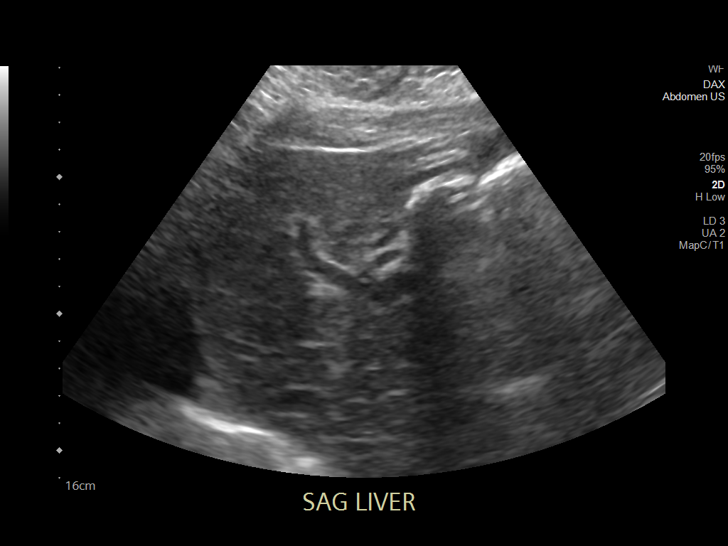
[im 31/49]
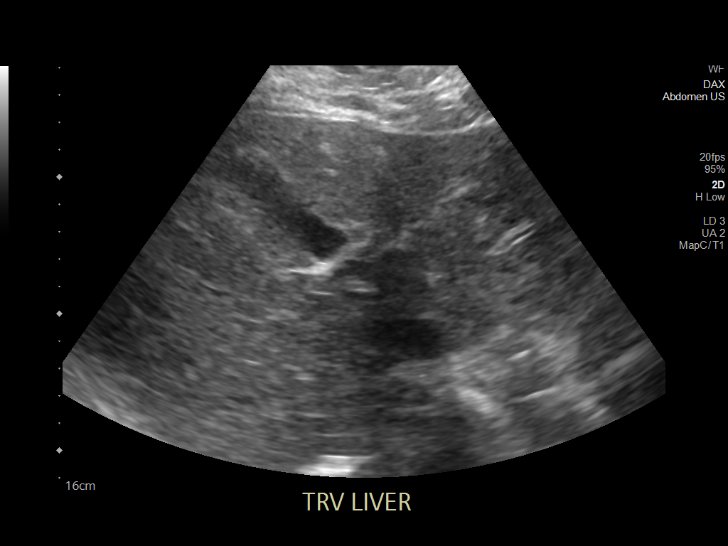
[im 33/49]
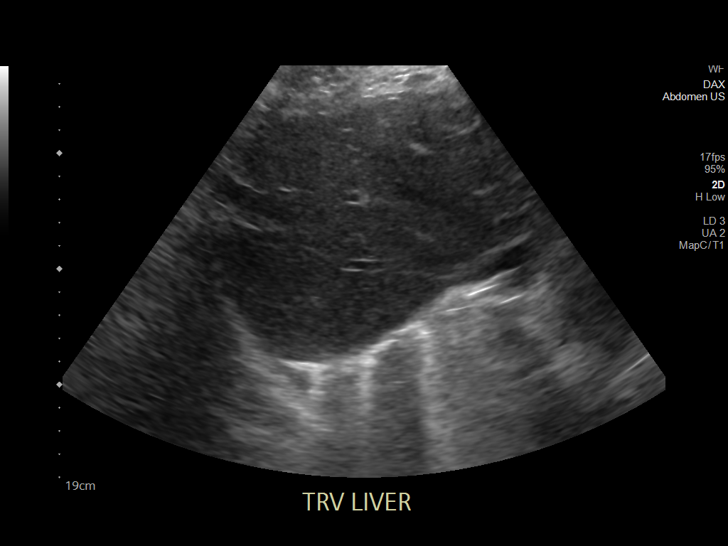
[im 37/49]
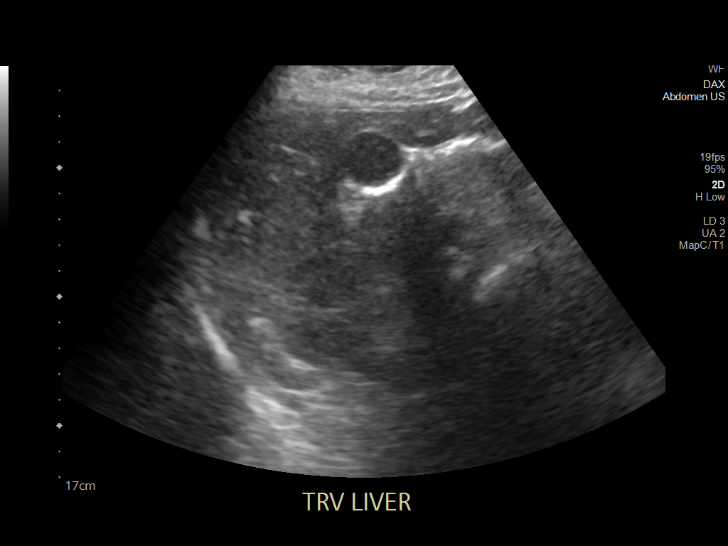
[im 41/49]
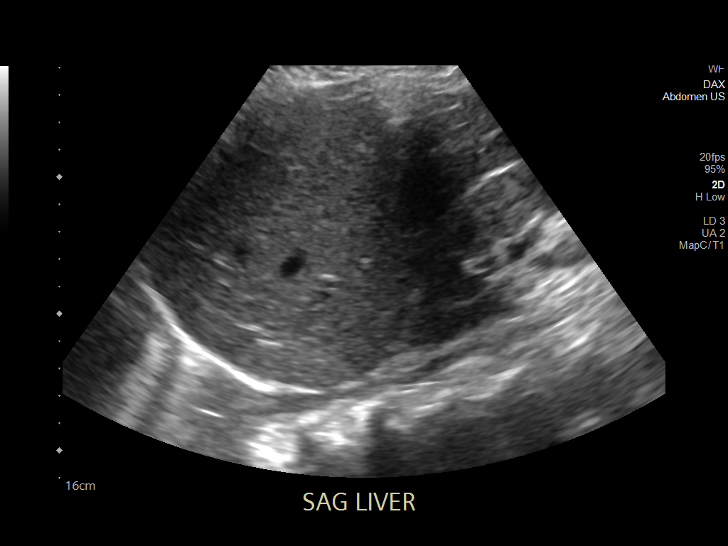
[im 45/49]
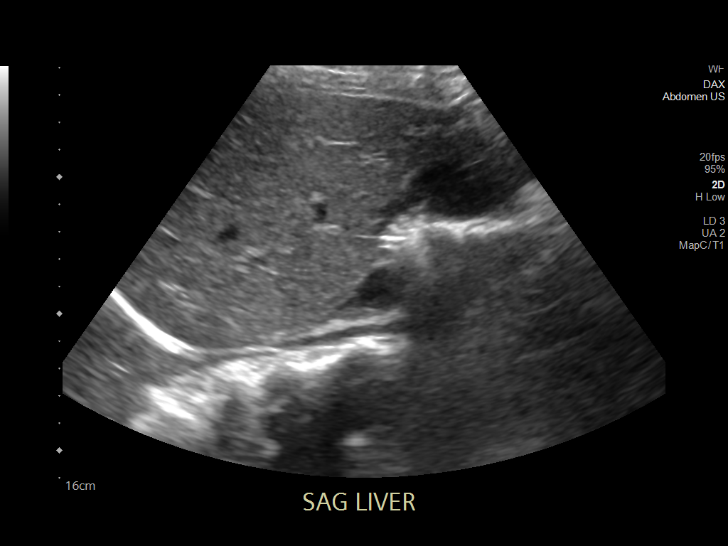
[im 49/49]
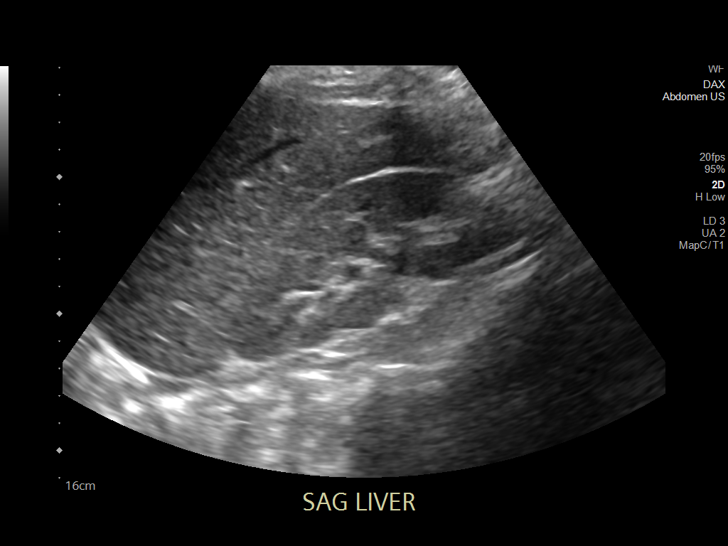

[14 of 25 positions shown; findings below may reference images not displayed]

FINDINGS: Gallbladder:

Physiologically distended with layering sludge. No gallstones or
wall thickening visualized. No sonographic Murphy sign noted by
sonographer.

Common bile duct:

Diameter: 4 mm, normal.

Liver:

Mildly heterogeneous in parenchymal echogenicity but no discrete
focal lesion. No capsular nodularity. Portal vein is patent on color
Doppler imaging with normal direction of blood flow towards the
liver.

Other: No right upper quadrant ascites
IMPRESSION: 1. Gallbladder sludge without gallstones or acute cholecystitis no
biliary dilatation.
2. Mildly heterogeneous liver parenchyma without focal abnormality.

## 2020-12-05 MED ORDER — BOOST / RESOURCE BREEZE PO LIQD CUSTOM
1.0000 | Freq: Three times a day (TID) | ORAL | Status: DC
  Administered 2020-12-05 – 2020-12-06 (×2): 1 via ORAL

## 2020-12-05 MED ORDER — SODIUM CHLORIDE 0.9 % IV SOLN: 12.5000 mg | Freq: Four times a day (QID) | INTRAVENOUS | Status: DC | PRN

## 2020-12-05 MED ORDER — PANTOPRAZOLE SODIUM 40 MG IV SOLR
40.0000 mg | INTRAVENOUS | Status: DC
  Administered 2020-12-05 – 2020-12-10 (×6): 40 mg via INTRAVENOUS
  Filled 2020-12-05 (×6): qty 40

## 2020-12-05 MED ORDER — POTASSIUM CHLORIDE CRYS ER 20 MEQ PO TBCR
40.0000 meq | EXTENDED_RELEASE_TABLET | ORAL | Status: AC
  Administered 2020-12-05 (×3): 40 meq via ORAL
  Filled 2020-12-05 (×3): qty 2

## 2020-12-05 MED ORDER — PROSOURCE PLUS PO LIQD
30.0000 mL | Freq: Three times a day (TID) | ORAL | Status: DC
  Administered 2020-12-05 – 2020-12-06 (×3): 30 mL via ORAL
  Filled 2020-12-05 (×5): qty 30

## 2020-12-05 NOTE — Progress Notes (Signed)
PROGRESS NOTE    Chelsea Mathis  HDQ:[id number] DOB: December 17, 1990 DOA: 12/03/2020 PCP: Pcp, No   Brief Narrative:  Chelsea Mathis is a 30 y.o. female with medical history significant of DM2. 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  Admitted to our service from 8/1-8/12 followed by stay in rehab from 8/12-8/30. Pt presented with severe sepsis as well as DKA that admission. Multiple blood cultures positive for MRSA, pt put on IV vancomycin which she continues at home through PICC line. Due to myositis she also developed severe hip / pelvis pain which has been treated with Percocet PRN + Oxycontin scheduled. She has been taking the Oxycontin since discharge though she has held the percocet due to running low on this medication.  This time patient presented with intractable N/V. Upon arrival to ED, she was hemodynamically stable with WBC 12k.   CT A/P: 1) new since 8/1 R SI joint septic arthritis. 2) small Thin crescentic fluid collection in the left hip periarticular musculature measuring 3.9 x 5 mm, suspicious for abscess. Small amount of ill-defined fluid in the right hip periarticular musculature. 3) ? L pyelonephritis  Lactate 1.9. Given 2L LR bolus. Given Rocephin for possible superimposed pyelonephritis.Vanc continued for MRSA bacteremia, endocarditis, myositis.  Assessment & Plan:   Principal Problem:   Intractable nausea and vomiting Active Problems:   Bacteremia due to Staphylococcus aureus   DM2 (diabetes mellitus, type 2) (HCC)   Sepsis (HCC)   Myositis   Acute bacterial endocarditis   Septic arthritis of sacroiliac joint (HCC)   Chronic pain syndrome  Intractable nausea and vomiting: Could very well be due to infection itself.  She had requested to start her on diet yesterday since she felt better but since yesterday she has had multiple episodes of vomiting.  She now believes that it is secondary to pyelonephritis because she received Rocephin in the ED and felt  better but then she has not received any further Rocephin and now she is vomiting.  She was reassured that UA was not impressive enough to treat for UTI and ID also had suggested not to treat.  Continue Zofran and add Phenergan.  CT abdomen upon admission was negative for any acute pathology.  She now has some tenderness at the epigastric as well as right upper quadrant region.  Will obtain abdominal ultrasound to rule out gallstones.  We will also start on Protonix for possible gastritis.  MRSA bacteremia, endocarditis and myositis: Continue vancomycin, ID on board.  Sepsis secondary to septic arthritis of Right SI joint/left hip septic arthritis and possible abscess, POA: IR was consulted and per them, patient does not have enough fluid at the left hip joint to be drained.  ID is recommending formally consulting orthopedics about their opinion about SI joint and that patient may possibly need surgical debridement.  Discussed with Dr. Linna Caprice who will see patient tomorrow however mentioned that her SI joint does not need any surgery and recommended aspiration by IR.  IR has been consulted for that.  Antibiotics per ID.  Possible left pyelonephritis: Patient states that she did have some burning urination prior to coming, CT abdomen suspicious for possible left pyelonephritis.  UA has trace leukoesterase but no nitrites or WBC.  Urine culture showing multiple organisms.  ID recommends not treating for UTI/pyelonephritis.  Hypokalemia: 2.8.  Will replace again.  Recheck in the morning.  Type 2 diabetes mellitus: Patient did not meet any criteria of DKA.  Recent hemoglobin A1c on 10/31/2020 was  12.3.  She was discharged on 24 units of Lantus from rehab.  She is only on SSI here and Lantus 15 units.  Blood sugar controlled.  Continue this regimen.  DVT prophylaxis: enoxaparin (LOVENOX) injection 40 mg Start: 12/03/20 2330   Code Status: Full Code  Family Communication:  None present at bedside.  Plan of  care discussed with patient in length and he verbalized understanding and agreed with it.  Status is: Inpatient  Remains inpatient appropriate because:Inpatient level of care appropriate due to severity of illness  Dispo: The patient is from: Home              Anticipated d/c is to: Home              Patient currently is not medically stable to d/c.   Difficult to place patient No   Estimated body mass index is 32.47 kg/m as calculated from the following:   Height as of 11/11/20: 5\' 4"  (1.626 m).   Weight as of 11/29/20: 85.8 kg.   Nutritional Assessment: There is no height or weight on file to calculate BMI.. Seen by dietician.  I agree with the assessment and plan as outlined below: Nutrition Status:   Skin Assessment: I have examined the patient's skin and I agree with the wound assessment as performed by the wound care RN as outlined below:    Consultants:  ID IR  Procedures:  None  Antimicrobials:  Anti-infectives (From admission, onward)    Start     Dose/Rate Route Frequency Ordered Stop   12/03/20 2100  vancomycin (VANCOREADY) IVPB 1750 mg/350 mL        1,750 mg 175 mL/hr over 120 Minutes Intravenous 2 times daily 12/03/20 2049     12/03/20 2030  cefTRIAXone (ROCEPHIN) 2 g in sodium chloride 0.9 % 100 mL IVPB        2 g 200 mL/hr over 30 Minutes Intravenous  Once 12/03/20 2018 12/03/20 2110          Subjective: Seen and examined.  Chaperone at the bedside.  Patient complains of right hip pain.  In tears.  Believes that she has UTI.  Objective: Vitals:   12/04/20 1237 12/04/20 1930 12/05/20 0353 12/05/20 0755  BP: 139/89 139/80 137/77 (!) 142/83  Pulse: 65 80 75 67  Resp: 16 17 17 15   Temp: 98.2 F (36.8 C) 98.9 F (37.2 C) 98.1 F (36.7 C) 98.3 F (36.8 C)  TempSrc: Oral Oral Oral Oral  SpO2: 99%  99% 98%    Intake/Output Summary (Last 24 hours) at 12/05/2020 1307 Last data filed at 12/05/2020 0900 Gross per 24 hour  Intake 726.22 ml  Output  --  Net 726.22 ml    There were no vitals filed for this visit.  Examination:  General exam: Appears calm and comfortable  Respiratory system: Clear to auscultation. Respiratory effort normal. Cardiovascular system: S1 & S2 heard, RRR. No JVD, murmurs, rubs, gallops or clicks. No pedal edema. Gastrointestinal system: Abdomen is nondistended, soft and tender in right upper quadrant and epigastrium. No organomegaly or masses felt. Normal bowel sounds heard. Central nervous system: Alert and oriented. No focal neurological deficits. Extremities: Symmetric 5 x 5 power.  Right hip tenderness. Skin: No rashes, lesions or ulcers.  Psychiatry: Judgement and insight appear normal. Mood & affect appropriate.    Data Reviewed: I have personally reviewed following labs and imaging studies  CBC: Recent Labs  Lab 12/03/20 1206 12/03/20 1344 12/04/20 0554 12/05/20 0345  WBC 12.0*  --  13.3* 10.9*  NEUTROABS 10.0*  --   --  7.0  HGB 10.6* 12.6 8.7* 8.7*  HCT 34.9* 37.0 27.9* 28.5*  MCV 83.9  --  81.6 82.4  PLT 508*  --  536* 525*    Basic Metabolic Panel: Recent Labs  Lab 12/03/20 1206 12/03/20 1344 12/04/20 0554 12/05/20 0345  NA 136 140 133* 136  K 3.4* 3.6 3.0* 2.8*  CL 101  --  100 99  CO2 23  --  21* 27  GLUCOSE 174*  --  155* 110*  BUN <5*  --  3* <5*  CREATININE 0.43*  --  0.40* 0.32*  CALCIUM 9.1  --  8.8* 8.8*    GFR: Estimated Creatinine Clearance: 106.9 mL/min (A) (by C-G formula based on SCr of 0.32 mg/dL (L)). Liver Function Tests: Recent Labs  Lab 12/03/20 1206 12/04/20 0554  AST 16 15  ALT 11 8  ALKPHOS 110 82  BILITOT 1.1 1.0  PROT 9.2* 7.7  ALBUMIN 2.7* 2.3*    No results for input(s): LIPASE, AMYLASE in the last 168 hours. No results for input(s): AMMONIA in the last 168 hours. Coagulation Profile: Recent Labs  Lab 12/03/20 1206  INR 1.3*    Cardiac Enzymes: Recent Labs  Lab 12/03/20 1206  CKTOTAL 25*    BNP (last 3 results) No  results for input(s): PROBNP in the last 8760 hours. HbA1C: No results for input(s): HGBA1C in the last 72 hours. CBG: Recent Labs  Lab 12/04/20 2040 12/05/20 0029 12/05/20 0514 12/05/20 0814 12/05/20 1124  GLUCAP 152* 123* 125* 110* 109*    Lipid Profile: No results for input(s): CHOL, HDL, LDLCALC, TRIG, CHOLHDL, LDLDIRECT in the last 72 hours. Thyroid Function Tests: No results for input(s): TSH, T4TOTAL, FREET4, T3FREE, THYROIDAB in the last 72 hours. Anemia Panel: No results for input(s): VITAMINB12, FOLATE, FERRITIN, TIBC, IRON, RETICCTPCT in the last 72 hours. Sepsis Labs: Recent Labs  Lab 12/03/20 1334 12/03/20 2310  LATICACIDVEN 1.9 0.9     Recent Results (from the past 240 hour(s))  Blood Culture (routine x 2)     Status: None (Preliminary result)   Collection Time: 12/03/20 12:06 PM   Specimen: BLOOD  Result Value Ref Range Status   Specimen Description BLOOD BLOOD RIGHT HAND  Final   Special Requests   Final    BOTTLES DRAWN AEROBIC AND ANAEROBIC Blood Culture results may not be optimal due to an inadequate volume of blood received in culture bottles   Culture   Final    NO GROWTH 2 DAYS Performed at [facility], [address] [place], [place] [address]    Report Status PENDING  Incomplete  Blood Culture (routine x 2)     Status: None (Preliminary result)   Collection Time: 12/03/20 12:11 PM   Specimen: BLOOD  Result Value Ref Range Status   Specimen Description BLOOD RIGHT ANTECUBITAL  Final   Special Requests   Final    BOTTLES DRAWN AEROBIC AND ANAEROBIC Blood Culture adequate volume   Culture   Final    NO GROWTH 2 DAYS Performed at [facility], [address], [place], [place] [address]    Report Status PENDING  Incomplete  Urine Culture     Status: Abnormal   Collection Time: 12/03/20  8:54 PM   Specimen: Urine, Clean Catch  Result Value Ref Range Status   Specimen Description URINE, CLEAN CATCH  Final   Special Requests  Final    NONE Performed at [facility], [address], [place], [place] [address]    Culture MULTIPLE SPECIES PRESENT, SUGGEST RECOLLECTION (A)  Final   Report Status 12/05/2020 FINAL  Final  SARS CORONAVIRUS 2 (TAT 6-24 HRS) Nasopharyngeal Nasopharyngeal Swab     Status: None   Collection Time: 12/03/20  9:47 PM   Specimen: Nasopharyngeal Swab  Result Value Ref Range Status   SARS Coronavirus 2 NEGATIVE NEGATIVE Final    Comment: (NOTE) SARS-CoV-2 target nucleic acids are NOT DETECTED.  The SARS-CoV-2 RNA is generally detectable in upper and lower respiratory specimens during the acute phase of infection. Negative results do not preclude SARS-CoV-2 infection, do not rule out co-infections with other pathogens, and should not be used as the sole basis for treatment or other patient management decisions. Negative results must be combined with clinical observations, patient history, and epidemiological information. The expected result is Negative.  Fact Sheet for Patients: HairSlick.no  Fact Sheet for Healthcare Providers: quierodirigir.com  This test is not yet approved or cleared by the [place] FDA and  has been authorized for detection and/or diagnosis of SARS-CoV-2 by FDA under an Emergency Use Authorization (EUA). This EUA will remain  in effect (meaning this test can be used) for the duration of the COVID-19 declaration under Se ction 564(b)(1) of the Act, 21 U.S.C. section 360bbb-3(b)(1), unless the authorization is terminated or revoked sooner.  Performed at [facility], [address], [place], [place] [address]   MRSA Next Gen by PCR, Nasal     Status: None   Collection Time: 12/04/20  7:47 AM   Specimen: Nasal Mucosa; Nasal Swab  Result Value Ref Range Status   MRSA by PCR Next Gen NOT DETECTED NOT DETECTED Final    Comment: (NOTE) The GeneXpert MRSA Assay (FDA approved for NASAL specimens  only), is one component of a comprehensive MRSA colonization surveillance program. It is not intended to diagnose MRSA infection nor to guide or monitor treatment for MRSA infections. Test performance is not FDA approved in patients less than 51 years old. Performed at [facility], [address], [place], [place] [address]        Radiology Studies: CT ABDOMEN PELVIS W CONTRAST  Result Date: 12/03/2020 CLINICAL DATA:  Abdominal abscess/infection suspected Nausea/vomiting EXAM: CT ABDOMEN AND PELVIS WITH CONTRAST TECHNIQUE: Multidetector CT imaging of the abdomen and pelvis was performed using the standard protocol following bolus administration of intravenous contrast. CONTRAST:  33mL OMNIPAQUE IOHEXOL 350 MG/ML SOLN COMPARISON:  Abdominopelvic CT 10/31/2020 FINDINGS: Lower chest: Small to moderate-sized right pleural effusion is new from prior exam. There is pleural thickening in the lower right hemithorax with mild enhancement. Previous right basilar nodules are partially obscured by pleural fluid, the cavitary nodules are not definitively seen. Hepatobiliary: No focal hepatic abnormality. No calcified gallstone. No pericholecystic fat stranding. No biliary dilatation. Pancreas: No ductal dilatation or inflammation. Spleen: Nonspecific 6 mm low-density in the inferior spleen. This is retrospectively seen on prior exam. Spleen is normal in size. Adrenals/Urinary Tract: Normal adrenal glands. Heterogeneous area of decreased perfusion involving the posterior upper pole the left kidney, series 3, image 27. No hydronephrosis. No visualized renal calculi. Partially distended urinary bladder. Equivocal bladder wall thickening about the dome. Stomach/Bowel: The stomach is decompressed. There is no small bowel obstruction or inflammatory change. Appendix is not visualized, provided history of appendectomy. Majority of the colon is nondistended. There is equivocal mild pericolonic edema,  for example in  the transverse colon series 3, image 39. Vascular/Lymphatic: Normal caliber abdominal aorta. Patent portal vein. No portal venous or mesenteric gas. There prominent left inguinal lymph nodes, largest measuring 9 mm short axis. Reproductive: IUD in the pelvis short IUD in the uterus. No adnexal mass. Other: No free air. Small amount of free fluid in the pelvis may be physiologic or reactive. There is mild generalized body wall edema. Musculoskeletal: There is a thin crescentic fluid collection in the left hip periarticular musculature, series 3, image 78. This measures approximately 3.9 x 5 mm, series 3, image 81. There is mild stranding about the included right anterior thigh musculature with fatty atrophy. Low-density fluid in the right hip periarticular musculature, series 3, image 80, patient has history of myositis. Serpiginous lucencies and erosive changes involving the right sacroiliac joint that are new from prior exam. No convincing iliopsoas collection. IMPRESSION: 1. Heterogeneous area of decreased perfusion involving the posterior upper pole the left kidney, suspicious for pyelonephritis. 2. Thin crescentic fluid collection in the left hip periarticular musculature measuring 3.9 x 5 mm, suspicious for abscess. Small amount of ill-defined fluid in the right hip periarticular musculature. 3. Serpiginous lucencies and erosive changes involving the right sacroiliac joint that are new from prior exam, suspicious for septic arthritis. 4. Small to moderate-sized right pleural effusion is new from prior exam. There is pleural thickening in the lower right hemithorax with mild enhancement. Previous right basilar nodules, some of which are cavitary, are obscured by pleural effusion on the current exam. 5. Equivocal mild bladder wall thickening about the dome. 6. Prominent left inguinal lymph nodes are likely reactive. Electronically Signed   By: Narda Rutherford M.D.   On: 12/03/2020 19:37   DG Femur Min 2 Views  Right  Result Date: 12/03/2020 CLINICAL DATA:  Myositis. EXAM: RIGHT FEMUR 2 VIEWS COMPARISON:  None. FINDINGS: There is no evidence of fracture or other focal bone lesions. Soft tissues are unremarkable. No gas is present within the soft tissues. IMPRESSION: Negative. Electronically Signed   By: Marin Roberts M.D.   On: 12/03/2020 17:11    Scheduled Meds:  (feeding supplement) PROSource Plus  30 mL Oral TID BM   Chlorhexidine Gluconate Cloth  6 each Topical Daily   diclofenac Sodium  2 g Topical QID   enoxaparin (LOVENOX) injection  40 mg Subcutaneous QHS   feeding supplement  1 Container Oral TID BM   gabapentin  300 mg Oral TID   insulin aspart  0-9 Units Subcutaneous Q4H   insulin glargine-yfgn  15 Units Subcutaneous Daily   multivitamin with minerals  1 tablet Oral Daily   propranolol  10 mg Oral Daily   pyridOXINE  50 mg Oral Daily   senna-docusate  1 tablet Oral BID   tiZANidine  2 mg Oral TID   Continuous Infusions:  lactated ringers 100 mL/hr at 12/04/20 1644   promethazine (PHENERGAN) injection (IM or IVPB) 12.5 mg (12/05/20 0302)   vancomycin 1,750 mg (12/05/20 0934)     LOS: 1 day   Time spent: 30 minutes  Hughie Closs, MD [facility]  12/05/2020, 1:07 PM  Please page via Lore[facility] Stapler and do not message via secure chat for anything urgent. Secure chat can be used for anything non urgent and I will respond at my earliest availability.  How to contact the [facility] Attending or Consulting provider 7A - 7P or covering provider during after hours 7P -7A, for this patient?  Check the care team  in [facility] and look for a) attending/consulting [facility] provider listed and b) the [facility] team listed. Page or secure chat 7A-7P. Log into [email or web address] and use [facility] universal password to access. If you do not have the password, please contact the hospital operator. Locate the [facility] provider you are looking for under [facility] and page to a number that you can be directly  reached. If you still have difficulty reaching the provider, please page the [facility] (Director on Call) for the Hospitalists listed on amion for assistance.

## 2020-12-05 NOTE — Progress Notes (Addendum)
[facility] Infectious Diseases Follow Up Note  Patient Identification: Patient Name: Chelsea Mathis MRN: [id number] Admit Date: 12/03/2020 11:02 AM Age: 30 y.o.Today's Date: 12/05/2020   Reason for Visit: MRSA bacteremia  Principal Problem:   Intractable nausea and vomiting Active Problems:   Bacteremia due to Staphylococcus aureus   DM2 (diabetes mellitus, type 2) (HCC)   Sepsis (HCC)   Myositis   Acute bacterial endocarditis   Septic arthritis of sacroiliac joint (HCC)   Chronic pain syndrome  Antibiotics: Vancomycin 9/3- current                    Ceftriaxone 9/3    Lines/Tubes: Rt arm PICC line   Interval Events: Continues to be afebrile, WBC is downtrending.  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  Her UA was completely unremarkable on admission although she reported burning prior to admission.  I do not think we need to add gram-negative coverage for now.  But would be reasonable to get a repeat UA to look for any abnormality( ordered).  She is also complaining of nausea, vomiting and is having generalized abdominal pain. Her LFTs are  WNL Primary has ordered US abdomen. No plans for surgical intervention from Ortho.  Reasonable to consult IR for aspiration SI joint Follow-up CBC, BMP, vancomycin trough and UA/blood cultures  Duration will need to be re-determined pending all above  Discussed with  patient and RN   Rest of the management as per the primary team. Thank you for the consult. Please page with pertinent questions or concerns.  ______________________________________________________________________ Subjective patient seen and examined at the bedside. She was walking back to bed from bathroom. Complains of nausea/NBNB vomiting and generalised abdominal pain   Vitals BP (!) 142/83 (BP Location: Left Arm)   Pulse 67   Temp 98.3 F (36.8 C) (Oral)   Resp 15   SpO2 98%     Physical Exam Constitutional:  in mild distress, appears tired     Comments:   Cardiovascular:     Rate and Rhythm: Normal rate and regular rhythm.     Heart sounds:   Pulmonary:     Effort: Pulmonary effort is normal.     Comments:   Abdominal:     Palpations: Abdomen is soft.     Tenderness: generalised tenderness but no RT and guarding  Musculoskeletal:        General: No swelling or tenderness in peripheral joints   Skin:    Comments: No lesions or rashes   Neurological:     General: No focal deficit present. Awake, alert and oriented   Psychiatric:        Mood and Affect: Mood normal. Calm and cooperative   Pertinent Microbiology Results for orders placed or performed during the hospital encounter of 12/03/20  Blood Culture (routine x 2)     Status: None (Preliminary result)   Collection Time: 12/03/20 12:06 PM   Specimen: BLOOD  Result Value Ref Range Status   Specimen Description BLOOD BLOOD RIGHT HAND  Final  Special Requests   Final    BOTTLES DRAWN AEROBIC AND ANAEROBIC Blood Culture results may not be optimal due to an inadequate volume of blood received in culture bottles   Culture   Final    NO GROWTH 2 DAYS Performed at [facility], [address] [place], [place] [address]    Report Status PENDING  Incomplete  Blood Culture (routine x 2)     Status: None (Preliminary result)   Collection Time: 12/03/20 12:11 PM   Specimen: BLOOD  Result Value Ref  Range Status   Specimen Description BLOOD RIGHT ANTECUBITAL  Final   Special Requests   Final    BOTTLES DRAWN AEROBIC AND ANAEROBIC Blood Culture adequate volume   Culture   Final    NO GROWTH 2 DAYS Performed at [facility], [address], [place], [place] [address]    Report Status PENDING  Incomplete  Urine Culture     Status: Abnormal   Collection Time: 12/03/20  8:54 PM   Specimen: Urine, Clean Catch  Result Value Ref Range Status   Specimen Description URINE, CLEAN CATCH  Final   Special Requests   Final    NONE Performed at [facility], [address], [place], [place] [address]    Culture MULTIPLE SPECIES PRESENT, SUGGEST RECOLLECTION (A)  Final   Report Status 12/05/2020 FINAL  Final  SARS CORONAVIRUS 2 (TAT 6-24 HRS) Nasopharyngeal Nasopharyngeal Swab     Status: None   Collection Time: 12/03/20  9:47 PM   Specimen: Nasopharyngeal Swab  Result Value Ref Range Status   SARS Coronavirus 2 NEGATIVE NEGATIVE Final    Comment: (NOTE) SARS-CoV-2 target nucleic acids are NOT DETECTED.  The SARS-CoV-2 RNA is generally detectable in upper and lower respiratory specimens during the acute phase of infection. Negative results do not preclude SARS-CoV-2 infection, do not rule out co-infections with other pathogens, and should not be used as the sole basis for treatment or other patient management decisions. Negative results must be combined with clinical observations, patient history, and epidemiological information. The expected result is Negative.  Fact Sheet for Patients: HairSlick.no  Fact Sheet for Healthcare Providers: quierodirigir.com  This test is not yet approved or cleared by the [place] FDA and  has been authorized for detection and/or diagnosis of SARS-CoV-2 by FDA under an Emergency Use Authorization (EUA). This EUA will remain  in effect (meaning this test can be used) for the duration  of the COVID-19 declaration under Se ction 564(b)(1) of the Act, 21 U.S.C. section 360bbb-3(b)(1), unless the authorization is terminated or revoked sooner.  Performed at [facility], [address], [place], [place] [address]   MRSA Next Gen by PCR, Nasal     Status: None   Collection Time: 12/04/20  7:47 AM   Specimen: Nasal Mucosa; Nasal Swab  Result Value Ref Range Status   MRSA by PCR Next Gen NOT DETECTED NOT DETECTED Final    Comment: (NOTE) The GeneXpert MRSA Assay (FDA approved for NASAL specimens only), is one component of a comprehensive MRSA colonization surveillance program. It is not intended to diagnose MRSA infection nor to guide or monitor treatment for MRSA infections. Test performance is not FDA approved in patients less than 38 years old. Performed at [facility], [address], [place], [place] [address]      Pertinent Lab. CBC Latest Ref Rng & Units 12/05/2020 12/04/2020 12/03/2020  WBC 4.0 - 10.5  K/uL 10.9(H) 13.3(H) -  Hemoglobin 12.0 - 15.0 g/dL 3.4(H) 9.6(Q) 22.9  Hematocrit 36.0 - 46.0 % 28.5(L) 27.9(L) 37.0  Platelets 150 - 400 K/uL 525(H) 536(H) -   CMP Latest Ref Rng & Units 12/05/2020 12/04/2020 12/03/2020  Glucose 70 - 99 mg/dL 798(X) 211(H) -  BUN 6 - 20 mg/dL <4(R) 3(L) -  Creatinine 0.44 - 1.00 mg/dL 7.40(C) 1.44(Y) -  Sodium 135 - 145 mmol/L 136 133(L) 140  Potassium 3.5 - 5.1 mmol/L 2.8(L) 3.0(L) 3.6  Chloride 98 - 111 mmol/L 99 100 -  CO2 22 - 32 mmol/L 27 21(L) -  Calcium 8.9 - 10.3 mg/dL 1.8(H) 6.3(J) -  Total Protein 6.5 - 8.1 g/dL - 7.7 -  Total Bilirubin 0.3 - 1.2 mg/dL - 1.0 -  Alkaline Phos 38 - 126 U/L - 82 -  AST 15 - 41 U/L - 15 -  ALT 0 - 44 U/L - 8 -     Pertinent Imaging today Plain films and CT images have been personally visualized and interpreted; radiology reports have been reviewed. Decision making incorporated into the Impression / Recommendations.  I spent more than 35 minutes for this patient encounter  including review of prior medical records, coordination of care  with greater than 50% of time being face to face/counseling and discussing diagnostics/treatment plan with the patient/family.  Electronically signed by:   Odette Fraction, MD Infectious Disease Physician [facility] for Infectious Disease Pager: [phone]

## 2020-12-05 NOTE — Progress Notes (Signed)
This chaplain responded to the Pt. interest in creating/updating the Pt. Advance Directive.  The Pt. is awake and participating in AD education.  The chaplain understands the Pt. wants to discuss the document with her family and F/U with the chaplain on Tuesday.  The chaplain left the document in the Pt. room for reviewing. The chaplain informed the Pt. on how to F/U with spiritual care as needed.  Chaplain Stephanie Acre Pager [phone]

## 2020-12-05 NOTE — Progress Notes (Signed)
Initial Nutrition Assessment  DOCUMENTATION CODES:   Obesity unspecified  INTERVENTION:   -D/c Ensure Enlive po BID, each supplement provides 350 kcal and 20 grams of protein  -Boost Breeze po TID, each supplement provides 250 kcal and 9 grams of protein  -30 ml Prosource Plus TID, each supplement provides 100 kcals and 15 grams protein -Continue MVI with minerals daily  NUTRITION DIAGNOSIS:   Inadequate oral intake related to poor appetite, nausea, vomiting as evidenced by meal completion < 25%, per patient/family report.  GOAL:   Patient will meet greater than or equal to 90% of their needs  MONITOR:   PO intake, Supplement acceptance, Diet advancement, Labs, Weight trends, Skin, I & O's  REASON FOR ASSESSMENT:   Malnutrition Screening Tool    ASSESSMENT:   Chelsea Mathis is a 30 y.o. female with medical history significant of DM2.  Pt admitted with intractable nausea and vomiting, septic arthritis of SI joint, and MRSA/endocarditis/myositis.   9/4- per IR notes, intramuscular fluid collection of lt hip too small to drain  Reviewed I/O's: +857 ml x 24 hours and +2.9 L since admission  UOP: 900 ml x 24 hours  Spoke with pt at bedside, who reports decreased appetite secondary to nausea. Pt shares that she was recently discharged from the hospital on 11/30/20 and has had poor oral intake secondary to abdominal pain and stress. Prior to discharge, she reports her intake has picking up and consumed 3 meals per day (Breakfast: cereal and orange juice; Lunch: sandwich; Dinner: meat, starch, and vegetable). Per pt, she has been unable to keep foods and liquids down secondary to nausea and vomiting for the past 2 days.   Pt reports she tried to eat today and consumed an entire Ensure supplement, which made her vomit. Pt shares that the chicken broth made her feel queasy and has been keeping down water without difficulty. Noted meal completion 0-50%.  Pt reports her UBW is around  184#. She suspects she has lost some weight within the past week secondary to poor oral intake. Reviewed wt hx; pt has experienced a 5% wt loss over the past month, which is significant for time frame.   Discussed importance of good meal and supplement intake to promote healing. Pt is amenable to [employer].   Medications reviewed and include vitamin B-6 and senokot.   Lab Results  Component Value Date   HGBA1C 12.3 (H) 10/31/2020   PTA DM medications are none.   Labs reviewed: K: 2.8, CBGS: 110-152 (inpatient orders for glycemic control are 0-9 units insulin aspart every 4 hours and 15 units insulin glargine-yfgn daily).    NUTRITION - FOCUSED PHYSICAL EXAM:  Flowsheet Row Most Recent Value  Orbital Region No depletion  Upper Arm Region No depletion  Thoracic and Lumbar Region No depletion  Buccal Region No depletion  Temple Region No depletion  Clavicle Bone Region No depletion  Clavicle and Acromion Bone Region No depletion  Scapular Bone Region No depletion  Dorsal Hand No depletion  Anterior Thigh Region No depletion  Posterior Calf Region No depletion  Edema (RD Assessment) None  Hair Reviewed  Eyes Reviewed  Mouth Reviewed  Skin Reviewed  Nails Reviewed       Diet Order:   Diet Order             Diet clear liquid Room service appropriate? Yes; Fluid consistency: Thin  Diet effective now  EDUCATION NEEDS:   Education needs have been addressed  Skin:  Skin Assessment: Reviewed RN Assessment  Last BM:  12/03/20  Height:   Ht Readings from Last 1 Encounters:  11/11/20 5\' 4"  (1.626 m)    Weight:   Wt Readings from Last 1 Encounters:  11/29/20 85.8 kg    Ideal Body Weight:  54.5 kg  BMI:  There is no height or weight on file to calculate BMI.  Estimated Nutritional Needs:   Kcal:  1700-1900  Protein:  105-110 grams  Fluid:  > 1.7 L    12/01/20, RD, LDN, CDCES Registered Dietitian II Certified Diabetes Care and  Education Specialist Please refer to [facility] for RD and/or RD on-call/weekend/after hours pager

## 2020-12-06 ENCOUNTER — Other Ambulatory Visit (HOSPITAL_COMMUNITY): Payer: Self-pay

## 2020-12-06 DIAGNOSIS — R112 Nausea with vomiting, unspecified: Secondary | ICD-10-CM | POA: Diagnosis not present

## 2020-12-06 LAB — GLUCOSE, CAPILLARY
Glucose-Capillary: 108 mg/dL — ABNORMAL HIGH (ref 70–99)
Glucose-Capillary: 109 mg/dL — ABNORMAL HIGH (ref 70–99)
Glucose-Capillary: 71 mg/dL (ref 70–99)
Glucose-Capillary: 88 mg/dL (ref 70–99)
Glucose-Capillary: 92 mg/dL (ref 70–99)
Glucose-Capillary: 96 mg/dL (ref 70–99)
Glucose-Capillary: 96 mg/dL (ref 70–99)

## 2020-12-06 LAB — CBC WITH DIFFERENTIAL/PLATELET
Abs Immature Granulocytes: 0.03 10*3/uL (ref 0.00–0.07)
Basophils Absolute: 0.1 10*3/uL (ref 0.0–0.1)
Basophils Relative: 1 %
Eosinophils Absolute: 0.1 10*3/uL (ref 0.0–0.5)
Eosinophils Relative: 1 %
HCT: 29.1 % — ABNORMAL LOW (ref 36.0–46.0)
Hemoglobin: 8.9 g/dL — ABNORMAL LOW (ref 12.0–15.0)
Immature Granulocytes: 0 %
Lymphocytes Relative: 33 %
Lymphs Abs: 3.1 10*3/uL (ref 0.7–4.0)
MCH: 25.2 pg — ABNORMAL LOW (ref 26.0–34.0)
MCHC: 30.6 g/dL (ref 30.0–36.0)
MCV: 82.4 fL (ref 80.0–100.0)
Monocytes Absolute: 0.9 10*3/uL (ref 0.1–1.0)
Monocytes Relative: 10 %
Neutro Abs: 5.3 10*3/uL (ref 1.7–7.7)
Neutrophils Relative %: 55 %
Platelets: 521 10*3/uL — ABNORMAL HIGH (ref 150–400)
RBC: 3.53 MIL/uL — ABNORMAL LOW (ref 3.87–5.11)
RDW: 16 % — ABNORMAL HIGH (ref 11.5–15.5)
WBC: 9.4 10*3/uL (ref 4.0–10.5)
nRBC: 0 % (ref 0.0–0.2)

## 2020-12-06 LAB — LIPASE, BLOOD: Lipase: 45 U/L (ref 11–51)

## 2020-12-06 LAB — HEPATIC FUNCTION PANEL
ALT: 8 U/L (ref 0–44)
AST: 17 U/L (ref 15–41)
Albumin: 2.3 g/dL — ABNORMAL LOW (ref 3.5–5.0)
Alkaline Phosphatase: 75 U/L (ref 38–126)
Bilirubin, Direct: <0.1 mg/dL (ref 0.0–0.2)
Total Bilirubin: 0.5 mg/dL (ref 0.3–1.2)
Total Protein: 7.2 g/dL (ref 6.5–8.1)

## 2020-12-06 LAB — BASIC METABOLIC PANEL
Anion gap: 7 (ref 5–15)
BUN: <5 mg/dL — ABNORMAL LOW (ref 6–20)
CO2: 27 mmol/L (ref 22–32)
Calcium: 8.6 mg/dL — ABNORMAL LOW (ref 8.9–10.3)
Chloride: 102 mmol/L (ref 98–111)
Creatinine, Ser: 0.31 mg/dL — ABNORMAL LOW (ref 0.44–1.00)
GFR, Estimated: >60 mL/min (ref >60)
Glucose, Bld: 77 mg/dL (ref 70–99)
Potassium: 3 mmol/L — ABNORMAL LOW (ref 3.5–5.1)
Sodium: 136 mmol/L (ref 135–145)

## 2020-12-06 LAB — MAGNESIUM: Magnesium: 1.5 mg/dL — ABNORMAL LOW (ref 1.7–2.4)

## 2020-12-06 MED ORDER — POTASSIUM CHLORIDE CRYS ER 20 MEQ PO TBCR
40.0000 meq | EXTENDED_RELEASE_TABLET | ORAL | Status: AC
  Administered 2020-12-06 (×2): 40 meq via ORAL
  Filled 2020-12-06 (×2): qty 2

## 2020-12-06 MED ORDER — CEFTRIAXONE SODIUM 2 G IJ SOLR
2.0000 g | INTRAMUSCULAR | Status: DC
  Administered 2020-12-06 – 2020-12-11 (×6): 2 g via INTRAVENOUS
  Filled 2020-12-06 (×6): qty 20

## 2020-12-06 NOTE — Consult Note (Signed)
Reason for Consult:Left SI/thigh infections Referring Physician: Hughie Closs Time called: 0730 Time at bedside: 0847   Chelsea Mathis is an 30 y.o. female.  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 She was admitted in August and treated medically. She was discharged but was readmitted a couple of days later with intractable N/V and lethargy. CT scan showed continued right thigh/hip myositis and new possible right SI septic arthritis and orthopedic surgery was consulted. She notes no change in her musculoskeletal symptoms in the last 2 weeks.  Past Medical History:  Diagnosis Date   Diabetes mellitus (HCC)    Gestational diabetes     Past Surgical History:  Procedure Laterality Date   LAPAROSCOPIC APPENDECTOMY N/A 11/11/2016   Procedure: APPENDECTOMY LAPAROSCOPIC;  Surgeon: Violeta Gelinas, MD;  Location: [facility] OR;  Service: General;  Laterality: N/A;   TEE WITHOUT CARDIOVERSION N/A 11/03/2020   Procedure: TRANSESOPHAGEAL ECHOCARDIOGRAM (TEE);  Surgeon: Jodelle Red, MD;  Location: [facility] ENDOSCOPY;  Service: Cardiovascular;  Laterality: N/A;    Family History  Problem Relation Age of Onset   Diabetes Mother    Hypertension Mother    Diabetes Maternal Uncle    Diabetes Brother    Stroke Maternal Grandmother    Leukemia Paternal Grandmother    Leukemia Paternal Grandfather     Social History:  reports that she has never smoked. She has never used smokeless tobacco. She reports that she does not drink alcohol and does not use drugs.  Allergies: No Known Allergies  Medications: I have reviewed the patient's current medications.  Results for orders placed or performed during the hospital encounter of 12/03/20 (from the past 48 hour(s))  Glucose, capillary     Status: Abnormal   Collection Time: 12/04/20 11:36 AM  Result Value Ref Range   Glucose-Capillary 154 (H) 70 - 99 mg/dL    Comment: Glucose reference range  applies only to samples taken after fasting for at least 8 hours.  Vancomycin, peak     Status: None   Collection Time: 12/04/20 12:00 PM  Result Value Ref Range   Vancomycin Pk 32 30 - 40 ug/mL    Comment: Performed at [facility], [address], [place], [place] [address]  Sedimentation rate     Status: Abnormal   Collection Time: 12/04/20 12:30 PM  Result Value Ref Range   Sed Rate 85 (H) 0 - 22 mm/hr    Comment: Performed at [facility], [address] [place], [place] [address]  C-reactive protein     Status: Abnormal   Collection Time: 12/04/20 12:30 PM  Result Value Ref Range   CRP 4.7 (H) <1.0 mg/dL    Comment: Performed at [facility], [address], [place], [place] [address]  Glucose, capillary     Status: Abnormal   Collection Time: 12/04/20  4:42 PM  Result Value Ref Range   Glucose-Capillary 110 (H) 70 - 99 mg/dL    Comment: Glucose reference range applies only to samples taken after fasting for at least 8 hours.  Vancomycin, trough     Status: Abnormal   Collection Time: 12/04/20  8:30 PM  Result Value Ref Range   Vancomycin Tr 10 (L) 15 - 20 ug/mL    Comment: Performed at [facility], [address], [place], [place] [address]  Glucose, capillary     Status: Abnormal   Collection Time: 12/04/20  8:40 PM  Result  Value Ref Range   Glucose-Capillary 152 (H) 70 - 99 mg/dL    Comment: Glucose reference range applies only to samples taken after fasting for at least 8 hours.  Glucose, capillary     Status: Abnormal   Collection Time: 12/05/20 12:29 AM  Result Value Ref Range   Glucose-Capillary 123 (H) 70 - 99 mg/dL    Comment: Glucose reference range applies only to samples taken after fasting for at least 8 hours.  CBC with Differential/Platelet     Status: Abnormal   Collection Time: 12/05/20  3:45 AM  Result Value Ref Range   WBC 10.9 (H) 4.0 - 10.5 K/uL   RBC 3.46 (L) 3.87 - 5.11 MIL/uL   Hemoglobin 8.7 (L) 12.0 - 15.0 g/dL   HCT  14.4 (L) 31.5 - 46.0 %   MCV 82.4 80.0 - 100.0 fL   MCH 25.1 (L) 26.0 - 34.0 pg   MCHC 30.5 30.0 - 36.0 g/dL   RDW 40.0 (H) 86.7 - 61.9 %   Platelets 525 (H) 150 - 400 K/uL   nRBC 0.0 0.0 - 0.2 %   Neutrophils Relative % 64 %   Neutro Abs 7.0 1.7 - 7.7 K/uL   Lymphocytes Relative 25 %   Lymphs Abs 2.8 0.7 - 4.0 K/uL   Monocytes Relative 9 %   Monocytes Absolute 1.0 0.1 - 1.0 K/uL   Eosinophils Relative 0 %   Eosinophils Absolute 0.0 0.0 - 0.5 K/uL   Basophils Relative 1 %   Basophils Absolute 0.1 0.0 - 0.1 K/uL   Immature Granulocytes 1 %   Abs Immature Granulocytes 0.06 0.00 - 0.07 K/uL    Comment: Performed at [facility], [address] [place], [place] [address]  Basic metabolic panel     Status: Abnormal   Collection Time: 12/05/20  3:45 AM  Result Value Ref Range   Sodium 136 135 - 145 mmol/L   Potassium 2.8 (L) 3.5 - 5.1 mmol/L   Chloride 99 98 - 111 mmol/L   CO2 27 22 - 32 mmol/L   Glucose, Bld 110 (H) 70 - 99 mg/dL    Comment: Glucose reference range applies only to samples taken after fasting for at least 8 hours.   BUN <5 (L) 6 - 20 mg/dL   Creatinine, Ser 6.71 (L) 0.44 - 1.00 mg/dL   Calcium 8.8 (L) 8.9 - 10.3 mg/dL   GFR, Estimated >24 >58 mL/min    Comment: (NOTE) Calculated using the CKD-EPI Creatinine Equation (2021)    Anion gap 10 5 - 15    Comment: Performed at [facility], [address] [place], [place] [address]  Glucose, capillary     Status: Abnormal   Collection Time: 12/05/20  5:14 AM  Result Value Ref Range   Glucose-Capillary 125 (H) 70 - 99 mg/dL    Comment: Glucose reference range applies only to samples taken after fasting for at least 8 hours.  Glucose, capillary     Status: Abnormal   Collection Time: 12/05/20  8:14 AM  Result Value Ref Range   Glucose-Capillary 110 (H) 70 - 99 mg/dL    Comment: Glucose reference range applies only to samples taken after fasting for at least 8 hours.  Glucose, capillary     Status:  Abnormal   Collection Time: 12/05/20 11:24 AM  Result Value Ref Range   Glucose-Capillary 109 (H) 70 - 99 mg/dL    Comment: Glucose reference range applies only to samples  taken after fasting for at least 8 hours.  Glucose, capillary     Status: None   Collection Time: 12/05/20  4:46 PM  Result Value Ref Range   Glucose-Capillary 84 70 - 99 mg/dL    Comment: Glucose reference range applies only to samples taken after fasting for at least 8 hours.  Urinalysis, Complete w Microscopic     Status: Abnormal   Collection Time: 12/05/20  6:00 PM  Result Value Ref Range   Color, Urine STRAW (A) YELLOW   APPearance CLEAR CLEAR   Specific Gravity, Urine 1.011 1.005 - 1.030   pH 7.0 5.0 - 8.0   Glucose, UA NEGATIVE NEGATIVE mg/dL   Hgb urine dipstick NEGATIVE NEGATIVE   Bilirubin Urine NEGATIVE NEGATIVE   Ketones, ur 20 (A) NEGATIVE mg/dL   Protein, ur NEGATIVE NEGATIVE mg/dL   Nitrite NEGATIVE NEGATIVE   Leukocytes,Ua NEGATIVE NEGATIVE   WBC, UA 0-5 0 - 5 WBC/hpf   Bacteria, UA NONE SEEN NONE SEEN   Squamous Epithelial / LPF 0-5 0 - 5   Mucus PRESENT     Comment: Performed at [facility], [address], [place], [place] [address]  Glucose, capillary     Status: Abnormal   Collection Time: 12/05/20  7:57 PM  Result Value Ref Range   Glucose-Capillary 105 (H) 70 - 99 mg/dL    Comment: Glucose reference range applies only to samples taken after fasting for at least 8 hours.  Glucose, capillary     Status: None   Collection Time: 12/06/20 12:11 AM  Result Value Ref Range   Glucose-Capillary 96 70 - 99 mg/dL    Comment: Glucose reference range applies only to samples taken after fasting for at least 8 hours.  CBC with Differential/Platelet     Status: Abnormal   Collection Time: 12/06/20  3:18 AM  Result Value Ref Range   WBC 9.4 4.0 - 10.5 K/uL   RBC 3.53 (L) 3.87 - 5.11 MIL/uL   Hemoglobin 8.9 (L) 12.0 - 15.0 g/dL   HCT 60.4 (L) 54.0 - 98.1 %   MCV 82.4 80.0 - 100.0 fL   MCH  25.2 (L) 26.0 - 34.0 pg   MCHC 30.6 30.0 - 36.0 g/dL   RDW 19.1 (H) 47.8 - 29.5 %   Platelets 521 (H) 150 - 400 K/uL   nRBC 0.0 0.0 - 0.2 %   Neutrophils Relative % 55 %   Neutro Abs 5.3 1.7 - 7.7 K/uL   Lymphocytes Relative 33 %   Lymphs Abs 3.1 0.7 - 4.0 K/uL   Monocytes Relative 10 %   Monocytes Absolute 0.9 0.1 - 1.0 K/uL   Eosinophils Relative 1 %   Eosinophils Absolute 0.1 0.0 - 0.5 K/uL   Basophils Relative 1 %   Basophils Absolute 0.1 0.0 - 0.1 K/uL   Immature Granulocytes 0 %   Abs Immature Granulocytes 0.03 0.00 - 0.07 K/uL    Comment: Performed at [facility], [address] [place], [place] [address]  Basic metabolic panel     Status: Abnormal   Collection Time: 12/06/20  3:18 AM  Result Value Ref Range   Sodium 136 135 - 145 mmol/L   Potassium 3.0 (L) 3.5 - 5.1 mmol/L   Chloride 102 98 - 111 mmol/L   CO2 27 22 - 32 mmol/L   Glucose, Bld 77 70 - 99 mg/dL    Comment: Glucose reference range applies only to samples taken after fasting for  at least 8 hours.   BUN <5 (L) 6 - 20 mg/dL   Creatinine, Ser 1.610.31 (L) 0.44 - 1.00 mg/dL   Calcium 8.6 (L) 8.9 - 10.3 mg/dL   GFR, Estimated >09>60 >60>60 mL/min    Comment: (NOTE) Calculated using the CKD-EPI Creatinine Equation (2021)    Anion gap 7 5 - 15    Comment: Performed at [facility], [address] [place], [place] [id number]  Glucose, capillary     Status: None   Collection Time: 12/06/20  4:26 AM  Result Value Ref Range   Glucose-Capillary 71 70 - 99 mg/dL    Comment: Glucose reference range applies only to samples taken after fasting for at least 8 hours.  Glucose, capillary     Status: None   Collection Time: 12/06/20  8:17 AM  Result Value Ref Range   Glucose-Capillary 88 70 - 99 mg/dL    Comment: Glucose reference range applies only to samples taken after fasting for at least 8 hours.    US Abdomen Limited RUQ (LIVER/GB)  Result Date: 12/05/2020 CLINICAL DATA:  Abdominal pain. EXAM: ULTRASOUND  ABDOMEN LIMITED RIGHT UPPER QUADRANT COMPARISON:  CT 12/03/2020 FINDINGS: Gallbladder: Physiologically distended with layering sludge. No gallstones or wall thickening visualized. No sonographic Murphy sign noted by sonographer. Common bile duct: Diameter: 4 mm, normal. Liver: Mildly heterogeneous in parenchymal echogenicity but no discrete focal lesion. No capsular nodularity. Portal vein is patent on color Doppler imaging with normal direction of blood flow towards the liver. Other: No right upper quadrant ascites IMPRESSION: 1. Gallbladder sludge without gallstones or acute cholecystitis no biliary dilatation. 2. Mildly heterogeneous liver parenchyma without focal abnormality. Electronically Signed   By: Narda RutherfordMelanie  Sanford M.D.   On: 12/05/2020 18:37    Review of Systems  Constitutional:  Positive for activity change. Negative for chills, diaphoresis and fever.  HENT:  Negative for ear discharge, ear pain, hearing loss and tinnitus.   Eyes:  Negative for photophobia and pain.  Respiratory:  Negative for cough and shortness of breath.   Cardiovascular:  Negative for chest pain.  Gastrointestinal:  Positive for nausea and vomiting. Negative for abdominal pain.  Genitourinary:  Negative for dysuria, flank pain, frequency and urgency.  Musculoskeletal:  Positive for arthralgias (Right hip). Negative for back pain, myalgias and neck pain.  Neurological:  Negative for dizziness and headaches.  Hematological:  Does not bruise/bleed easily.  Psychiatric/Behavioral:  The patient is not nervous/anxious.   Blood pressure (!) 164/88, pulse 85, temperature 97.7 F (36.5 C), temperature source Oral, resp. rate 16, SpO2 100 %, unknown if currently breastfeeding. Physical Exam Constitutional:      General: She is not in acute distress.    Appearance: She is well-developed. She is not diaphoretic.  HENT:     Head: Normocephalic and atraumatic.  Eyes:     General: No scleral icterus.       Right eye: No  discharge.        Left eye: No discharge.     Conjunctiva/sclera: Conjunctivae normal.  Cardiovascular:     Rate and Rhythm: Normal rate and regular rhythm.  Pulmonary:     Effort: Pulmonary effort is normal. No respiratory distress.  Musculoskeletal:     Cervical back: Normal range of motion.     Comments: Pelvis--no traumatic wounds or rash, no ecchymosis, stable to manual stress, mild right hip pain with lateral compression. No SI joint TTP.   Skin:    General: Skin is  warm and dry.  Neurological:     Mental Status: She is alert.  Psychiatric:        Mood and Affect: Mood normal.        Behavior: Behavior normal.    Assessment/Plan: Left thigh/?SI infections -- Given lack of tenderness over SI am doubtful CT represents real disease but could have IR aspirate if ID thinks it would be helpful. Left thigh abscess is too small to justify operative I&D and given lack of symptoms on that side would not recommend treatment.    Freeman Caldron, PA-C Orthopedic Surgery [phone] 12/06/2020, 9:08 AM

## 2020-12-06 NOTE — Progress Notes (Addendum)
[facility] Infectious Diseases Follow Up Note  Patient Identification: Patient Name: Chelsea Mathis MRN: [id number] Admit Date: 12/03/2020 11:02 AM Age: 30 y.o.Today's Date: 12/06/2020   Reason for Visit: MRSA bacteremia  Principal Problem:   Intractable nausea and vomiting Active Problems:   Bacteremia due to Staphylococcus aureus   DM2 (diabetes mellitus, type 2) (HCC)   Sepsis (HCC)   Myositis   Acute bacterial endocarditis   Septic arthritis of sacroiliac joint (HCC)   Chronic pain syndrome  Antibiotics: Vancomycin 9/3- current                    Ceftriaxone 9/3    Lines/Tubes: Rt arm PICC line   Interval Events: continues to be afebrile, No leukocytosis, repeat UA unremarkable for UTI. Continues to have persistent nausea/vomiting and abdominal pain. 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 Planned to get IR aspiration of RT SI joint.  Added of LFTs/Lipase, follow CBC and CMP Duration will need to be re-determined pending all above  Antiemetics, pain management per Primary   Rest of the management as per the primary team. Thank you for the consult. Please page with pertinent questions or concerns.  ______________________________________________________________________ Subjective patient seen and examined at the bedside. She is crying with nausea/vomiting and abdominal pain. Vitals BP (!) 164/88  (BP Location: Left Arm)   Pulse 85   Temp 97.7 F (36.5 C) (Oral)   Resp 16   SpO2 100%     Physical Exam Constitutional:  crying and looks uncomfortable due to pain     Comments:   Cardiovascular:     Rate and Rhythm: Normal rate and regular rhythm.     Heart sounds:   Pulmonary:     Effort: Pulmonary effort is normal.     Comments:   Abdominal:     Palpations: Abdomen is soft.     Tenderness: generalised tenderness but no RT and guarding  Musculoskeletal:        General: No swelling or tenderness in peripheral joints   Skin:    Comments: No lesions or rashes   Neurological:     General: No focal deficit present. Awake, alert and oriented   Psychiatric:        Mood and Affect: crying  Pertinent Microbiology Results for orders placed or performed during the hospital encounter of 12/03/20  Blood Culture (routine x 2)     Status: None (Preliminary result)   Collection Time: 12/03/20 12:06 PM   Specimen: BLOOD  Result Value Ref Range Status   Specimen Description BLOOD BLOOD RIGHT HAND  Final   Special Requests   Final    BOTTLES DRAWN AEROBIC AND ANAEROBIC Blood Culture results may not be optimal due to an inadequate volume of blood received in culture bottles   Culture   Final    NO GROWTH 3 DAYS Performed at [facility], [address] [place], [place] [address]    Report Status PENDING  Incomplete  Blood Culture (routine x 2)     Status: None (Preliminary result)  Collection Time: 12/03/20 12:11 PM   Specimen: BLOOD  Result Value Ref Range Status   Specimen Description BLOOD RIGHT ANTECUBITAL  Final   Special Requests   Final    BOTTLES DRAWN AEROBIC AND ANAEROBIC Blood Culture adequate volume   Culture   Final    NO GROWTH 3 DAYS Performed at [facility], [address], [place], [place] [address]    Report Status PENDING  Incomplete  Urine Culture     Status: Abnormal   Collection Time: 12/03/20  8:54 PM   Specimen: Urine, Clean Catch   Result Value Ref Range Status   Specimen Description URINE, CLEAN CATCH  Final   Special Requests   Final    NONE Performed at [facility], [address] [place], [place] [address]    Culture MULTIPLE SPECIES PRESENT, SUGGEST RECOLLECTION (A)  Final   Report Status 12/05/2020 FINAL  Final  SARS CORONAVIRUS 2 (TAT 6-24 HRS) Nasopharyngeal Nasopharyngeal Swab     Status: None   Collection Time: 12/03/20  9:47 PM   Specimen: Nasopharyngeal Swab  Result Value Ref Range Status   SARS Coronavirus 2 NEGATIVE NEGATIVE Final    Comment: (NOTE) SARS-CoV-2 target nucleic acids are NOT DETECTED.  The SARS-CoV-2 RNA is generally detectable in upper and lower respiratory specimens during the acute phase of infection. Negative results do not preclude SARS-CoV-2 infection, do not rule out co-infections with other pathogens, and should not be used as the sole basis for treatment or other patient management decisions. Negative results must be combined with clinical observations, patient history, and epidemiological information. The expected result is Negative.  Fact Sheet for Patients: HairSlick.no  Fact Sheet for Healthcare Providers: quierodirigir.com  This test is not yet approved or cleared by the [place] FDA and  has been authorized for detection and/or diagnosis of SARS-CoV-2 by FDA under an Emergency Use Authorization (EUA). This EUA will remain  in effect (meaning this test can be used) for the duration of the COVID-19 declaration under Se ction 564(b)(1) of the Act, 21 U.S.C. section 360bbb-3(b)(1), unless the authorization is terminated or revoked sooner.  Performed at [facility], [address] [place], [place] [address]   MRSA Next Gen by PCR, Nasal     Status: None   Collection Time: 12/04/20  7:47 AM   Specimen: Nasal Mucosa; Nasal Swab  Result Value Ref Range Status   MRSA by PCR Next Gen NOT  DETECTED NOT DETECTED Final    Comment: (NOTE) The GeneXpert MRSA Assay (FDA approved for NASAL specimens only), is one component of a comprehensive MRSA colonization surveillance program. It is not intended to diagnose MRSA infection nor to guide or monitor treatment for MRSA infections. Test performance is not FDA approved in patients less than 36 years old. Performed at [facility] Lab, [address], [place], [place] [address]      Pertinent Lab. CBC Latest Ref Rng & Units 12/06/2020 12/05/2020 12/04/2020  WBC 4.0 - 10.5 K/uL 9.4 10.9(H) 13.3(H)  Hemoglobin 12.0 - 15.0 g/dL 2.3(F) 5.7(D) 2.2(G)  Hematocrit 36.0 - 46.0 % 29.1(L) 28.5(L) 27.9(L)  Platelets 150 - 400 K/uL 521(H) 525(H) 536(H)   CMP Latest Ref Rng & Units 12/06/2020 12/05/2020 12/04/2020  Glucose 70 - 99 mg/dL 77 254(Y) 706(C)  BUN 6 - 20 mg/dL <3(J) <6(E) 3(L)  Creatinine 0.44 - 1.00 mg/dL 8.31(D) 1.76(H) 6.07(P)  Sodium 135 - 145 mmol/L 136 136 133(L)  Potassium 3.5 -  5.1 mmol/L 3.0(L) 2.8(L) 3.0(L)  Chloride 98 - 111 mmol/L 102 99 100  CO2 22 - 32 mmol/L 27 27 21(L)  Calcium 8.9 - 10.3 mg/dL 2.2(W) 9.7(L) 8.9(Q)  Total Protein 6.5 - 8.1 g/dL - - 7.7  Total Bilirubin 0.3 - 1.2 mg/dL - - 1.0  Alkaline Phos 38 - 126 U/L - - 82  AST 15 - 41 U/L - - 15  ALT 0 - 44 U/L - - 8     Pertinent Imaging today Plain films and CT images have been personally visualized and interpreted; radiology reports have been reviewed. Decision making incorporated into the Impression / Recommendations.  US abdomen 12/05/20 IMPRESSION: 1. Gallbladder sludge without gallstones or acute cholecystitis no biliary dilatation. 2. Mildly heterogeneous liver parenchyma without focal abnormality.   I spent more than 35 minutes for this patient encounter including review of prior medical records, coordination of care  with greater than 50% of time being face to face/counseling and discussing diagnostics/treatment plan with the  patient/family.  Electronically signed by:   Odette Fraction, MD Infectious Disease Physician [facility] Pager: [phone]

## 2020-12-06 NOTE — Progress Notes (Signed)
PROGRESS NOTE    Chelsea Mathis  BJY:[id number] DOB: 05/22/1990 DOA: 12/03/2020 PCP: Pcp, No   Brief Narrative:  Chelsea Mathis is a 30 y.o. female with medical history significant of DM2. 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  Admitted to our service from 8/1-8/12 followed by stay in rehab from 8/12-8/30. Pt presented with severe sepsis as well as DKA that admission. Multiple blood cultures positive for MRSA, pt put on IV vancomycin which she continues at home through PICC line. Due to myositis she also developed severe hip / pelvis pain which has been treated with Percocet PRN + Oxycontin scheduled. She has been taking the Oxycontin since discharge though she has held the percocet due to running low on this medication.  This time patient presented with intractable N/V. Upon arrival to ED, she was hemodynamically stable with WBC 12k.   CT A/P: 1) new since 8/1 R SI joint septic arthritis. 2) small Thin crescentic fluid collection in the left hip periarticular musculature measuring 3.9 x 5 mm, suspicious for abscess. Small amount of ill-defined fluid in the right hip periarticular musculature. 3) ? L pyelonephritis  Lactate 1.9. Given 2L LR bolus. Given Rocephin for possible superimposed pyelonephritis.Vanc continued for MRSA bacteremia, endocarditis, myositis.  Assessment & Plan:   Principal Problem:   Intractable nausea and vomiting Active Problems:   Bacteremia due to Staphylococcus aureus   DM2 (diabetes mellitus, type 2) (HCC)   Sepsis (HCC)   Myositis   Acute bacterial endocarditis   Septic arthritis of sacroiliac joint (HCC)   Chronic pain syndrome  Intractable nausea and vomiting: Still symptomatic with significant nausea and vomiting.  Ultrasound abdomen negative for gallstone or cholecystitis but has sludge.  CT abdomen negative for any acute pathology but it shows possible left-sided pyelonephritis.  UA unremarkable x2 but patient is convinced that she has UTI  and is crying saying that no one is listening to her.  I did inform her that ID has started her on Rocephin today empirically.  She does however has epigastric, right quadrant as well as suprapubic tenderness on exam.  Her leukocytosis has resolved and she is afebrile.  MRSA bacteremia, endocarditis and myositis: Continue vancomycin, ID on board.  Sepsis secondary to septic arthritis of Right SI joint/left hip septic arthritis and possible abscess, POA: IR was consulted and per them, patient does not have enough fluid at the left hip joint to be drained.  Ortho was consulted per ID recommendations.  Ortho does not recommend any surgical intervention.  Consult was placed for IR to aspirate SI joint.  Antibiotics per ID.   Possible left pyelonephritis: As discussed above.  Patient on Rocephin now.  Hypokalemia: 3.0.  Will replace.  Recheck in the morning  Type 2 diabetes mellitus: Patient did not meet any criteria of DKA.  Recent hemoglobin A1c on 10/31/2020 was 12.3.  She was discharged on 24 units of Lantus from rehab.  She is only on SSI here and Lantus 15 units.  Blood sugar controlled.  Continue this regimen.  DVT prophylaxis: enoxaparin (LOVENOX) injection 40 mg Start: 12/03/20 2330   Code Status: Full Code  Family Communication: Mother present at bedside.    Status is: Inpatient  Remains inpatient appropriate because:Inpatient level of care appropriate due to severity of illness  Dispo: The patient is from: Home              Anticipated d/c is to: Home  Patient currently is not medically stable to d/c.   Difficult to place patient No   Estimated body mass index is 32.47 kg/m as calculated from the following:   Height as of 11/11/20: 5\' 4"  (1.626 m).   Weight as of 11/29/20: 85.8 kg.   Nutritional Assessment: There is no height or weight on file to calculate BMI.. Seen by dietician.  I agree with the assessment and plan as outlined below: Nutrition Status: Nutrition  Problem: Inadequate oral intake Etiology: poor appetite, nausea, vomiting Skin Assessment: I have examined the patient's skin and I agree with the wound assessment as performed by the wound care RN as outlined below:    Consultants:  ID IR  Procedures:  None  Antimicrobials:  Anti-infectives (From admission, onward)    Start     Dose/Rate Route Frequency Ordered Stop   12/06/20 1030  cefTRIAXone (ROCEPHIN) 2 g in sodium chloride 0.9 % 100 mL IVPB        2 g 200 mL/hr over 30 Minutes Intravenous Every 24 hours 12/06/20 0919     12/03/20 2100  vancomycin (VANCOREADY) IVPB 1750 mg/350 mL        1,750 mg 175 mL/hr over 120 Minutes Intravenous 2 times daily 12/03/20 2049     12/03/20 2030  cefTRIAXone (ROCEPHIN) 2 g in sodium chloride 0.9 % 100 mL IVPB        2 g 200 mL/hr over 30 Minutes Intravenous  Once 12/03/20 2018 12/03/20 2110          Subjective: Patient seen and examined.  She was already crying when I entered the room.  She was stating that no one is listening to her and that she has UTI and that is why she is having nausea and vomiting.  She was reassured that she has been started on antibiotics for UTI if there is any.  Objective: Vitals:   12/05/20 0755 12/05/20 1606 12/05/20 2032 12/06/20 0822  BP: (!) 142/83 (!) 145/81 140/79 (!) 164/88  Pulse: 67 66 69 85  Resp: 15 15 18 16   Temp: 98.3 F (36.8 C) 98 F (36.7 C) 98.2 F (36.8 C) 97.7 F (36.5 C)  TempSrc: Oral Oral Oral Oral  SpO2: 98% 98% 97% 100%    Intake/Output Summary (Last 24 hours) at 12/06/2020 1129 Last data filed at 12/05/2020 1700 Gross per 24 hour  Intake 480 ml  Output --  Net 480 ml    There were no vitals filed for this visit.  Examination:  General exam: Appears emotional Respiratory system: Clear to auscultation. Respiratory effort normal. Cardiovascular system: S1 & S2 heard, RRR. No JVD, murmurs, rubs, gallops or clicks. No pedal edema. Gastrointestinal system: Abdomen is  nondistended, soft and tender at epigastrium, right upper quadrant and suprapubic area, no organomegaly or masses felt. Normal bowel sounds heard. Central nervous system: Alert and oriented. No focal neurological deficits. Extremities: Symmetric 5 x 5 power. Skin: No rashes, lesions or ulcers.     Data Reviewed: I have personally reviewed following labs and imaging studies  CBC: Recent Labs  Lab 12/03/20 1206 12/03/20 1344 12/04/20 0554 12/05/20 0345 12/06/20 0318  WBC 12.0*  --  13.3* 10.9* 9.4  NEUTROABS 10.0*  --   --  7.0 5.3  HGB 10.6* 12.6 8.7* 8.7* 8.9*  HCT 34.9* 37.0 27.9* 28.5* 29.1*  MCV 83.9  --  81.6 82.4 82.4  PLT 508*  --  536* 525* 521*    Basic Metabolic Panel: Recent Labs  Lab 12/03/20 1206 12/03/20 1344 12/04/20 0554 12/05/20 0345 12/06/20 0318  NA 136 140 133* 136 136  K 3.4* 3.6 3.0* 2.8* 3.0*  CL 101  --  100 99 102  CO2 23  --  21* 27 27  GLUCOSE 174*  --  155* 110* 77  BUN <5*  --  3* <5* <5*  CREATININE 0.43*  --  0.40* 0.32* 0.31*  CALCIUM 9.1  --  8.8* 8.8* 8.6*  MG  --   --   --   --  1.5*    GFR: Estimated Creatinine Clearance: 106.9 mL/min (A) (by C-G formula based on SCr of 0.31 mg/dL (L)). Liver Function Tests: Recent Labs  Lab 12/03/20 1206 12/04/20 0554  AST 16 15  ALT 11 8  ALKPHOS 110 82  BILITOT 1.1 1.0  PROT 9.2* 7.7  ALBUMIN 2.7* 2.3*    No results for input(s): LIPASE, AMYLASE in the last 168 hours. No results for input(s): AMMONIA in the last 168 hours. Coagulation Profile: Recent Labs  Lab 12/03/20 1206  INR 1.3*    Cardiac Enzymes: Recent Labs  Lab 12/03/20 1206  CKTOTAL 25*    BNP (last 3 results) No results for input(s): PROBNP in the last 8760 hours. HbA1C: No results for input(s): HGBA1C in the last 72 hours. CBG: Recent Labs  Lab 12/05/20 1646 12/05/20 1957 12/06/20 0011 12/06/20 0426 12/06/20 0817  GLUCAP 84 105* 96 71 88    Lipid Profile: No results for input(s): CHOL, HDL,  LDLCALC, TRIG, CHOLHDL, LDLDIRECT in the last 72 hours. Thyroid Function Tests: No results for input(s): TSH, T4TOTAL, FREET4, T3FREE, THYROIDAB in the last 72 hours. Anemia Panel: No results for input(s): VITAMINB12, FOLATE, FERRITIN, TIBC, IRON, RETICCTPCT in the last 72 hours. Sepsis Labs: Recent Labs  Lab 12/03/20 1334 12/03/20 2310  LATICACIDVEN 1.9 0.9     Recent Results (from the past 240 hour(s))  Blood Culture (routine x 2)     Status: None (Preliminary result)   Collection Time: 12/03/20 12:06 PM   Specimen: BLOOD  Result Value Ref Range Status   Specimen Description BLOOD BLOOD RIGHT HAND  Final   Special Requests   Final    BOTTLES DRAWN AEROBIC AND ANAEROBIC Blood Culture results may not be optimal due to an inadequate volume of blood received in culture bottles   Culture   Final    NO GROWTH 3 DAYS Performed at [facility], [address] [place], [place] [address]    Report Status PENDING  Incomplete  Blood Culture (routine x 2)     Status: None (Preliminary result)   Collection Time: 12/03/20 12:11 PM   Specimen: BLOOD  Result Value Ref Range Status   Specimen Description BLOOD RIGHT ANTECUBITAL  Final   Special Requests   Final    BOTTLES DRAWN AEROBIC AND ANAEROBIC Blood Culture adequate volume   Culture   Final    NO GROWTH 3 DAYS Performed at [facility], [address] [place], [place] [address]    Report Status PENDING  Incomplete  Urine Culture     Status: Abnormal   Collection Time: 12/03/20  8:54 PM   Specimen: Urine, Clean Catch  Result Value Ref Range Status   Specimen Description URINE, CLEAN CATCH  Final   Special Requests   Final    NONE Performed at [facility], [address] [place], [place] [address]    Culture MULTIPLE SPECIES PRESENT, SUGGEST RECOLLECTION (  A)  Final   Report Status 12/05/2020 FINAL  Final  SARS CORONAVIRUS 2 (TAT 6-24 HRS) Nasopharyngeal Nasopharyngeal Swab     Status: None   Collection  Time: 12/03/20  9:47 PM   Specimen: Nasopharyngeal Swab  Result Value Ref Range Status   SARS Coronavirus 2 NEGATIVE NEGATIVE Final    Comment: (NOTE) SARS-CoV-2 target nucleic acids are NOT DETECTED.  The SARS-CoV-2 RNA is generally detectable in upper and lower respiratory specimens during the acute phase of infection. Negative results do not preclude SARS-CoV-2 infection, do not rule out co-infections with other pathogens, and should not be used as the sole basis for treatment or other patient management decisions. Negative results must be combined with clinical observations, patient history, and epidemiological information. The expected result is Negative.  Fact Sheet for Patients: HairSlick.no  Fact Sheet for Healthcare Providers: quierodirigir.com  This test is not yet approved or cleared by the [place] FDA and  has been authorized for detection and/or diagnosis of SARS-CoV-2 by FDA under an Emergency Use Authorization (EUA). This EUA will remain  in effect (meaning this test can be used) for the duration of the COVID-19 declaration under Se ction 564(b)(1) of the Act, 21 U.S.C. section 360bbb-3(b)(1), unless the authorization is terminated or revoked sooner.  Performed at [facility], [address], [place], [place] [address]   MRSA Next Gen by PCR, Nasal     Status: None   Collection Time: 12/04/20  7:47 AM   Specimen: Nasal Mucosa; Nasal Swab  Result Value Ref Range Status   MRSA by PCR Next Gen NOT DETECTED NOT DETECTED Final    Comment: (NOTE) The GeneXpert MRSA Assay (FDA approved for NASAL specimens only), is one component of a comprehensive MRSA colonization surveillance program. It is not intended to diagnose MRSA infection nor to guide or monitor treatment for MRSA infections. Test performance is not FDA approved in patients less than 72 years old. Performed at [facility], [address], [place], [place] [address]        Radiology Studies: US Abdomen Limited RUQ (LIVER/GB)  Result Date: 12/05/2020 CLINICAL DATA:  Abdominal pain. EXAM: ULTRASOUND ABDOMEN LIMITED RIGHT UPPER QUADRANT COMPARISON:  CT 12/03/2020 FINDINGS: Gallbladder: Physiologically distended with layering sludge. No gallstones or wall thickening visualized. No sonographic Murphy sign noted by sonographer. Common bile duct: Diameter: 4 mm, normal. Liver: Mildly heterogeneous in parenchymal echogenicity but no discrete focal lesion. No capsular nodularity. Portal vein is patent on color Doppler imaging with normal direction of blood flow towards the liver. Other: No right upper quadrant ascites IMPRESSION: 1. Gallbladder sludge without gallstones or acute cholecystitis no biliary dilatation. 2. Mildly heterogeneous liver parenchyma without focal abnormality. Electronically Signed   By: Narda Rutherford M.D.   On: 12/05/2020 18:37    Scheduled Meds:  (feeding supplement) PROSource Plus  30 mL Oral TID BM   Chlorhexidine Gluconate Cloth  6 each Topical Daily   diclofenac Sodium  2 g Topical QID   enoxaparin (LOVENOX) injection  40 mg Subcutaneous QHS   feeding supplement  1 Container Oral TID BM   gabapentin  300 mg Oral TID   insulin aspart  0-9 Units Subcutaneous Q4H   insulin glargine-yfgn  15 Units Subcutaneous Daily   multivitamin with minerals  1 tablet Oral Daily   pantoprazole (PROTONIX) IV  40 mg Intravenous Q24H   potassium chloride  40 mEq Oral Q4H   propranolol  10 mg Oral Daily  pyridOXINE  50 mg Oral Daily   senna-docusate  1 tablet Oral BID   tiZANidine  2 mg Oral TID   Continuous Infusions:  cefTRIAXone (ROCEPHIN)  IV     lactated ringers 100 mL/hr at 12/05/20 2047   promethazine (PHENERGAN) injection (IM or IVPB) 12.5 mg (12/06/20 0922)   vancomycin 1,750 mg (12/06/20 1031)     LOS: 2 days   Time spent: 29 minutes  Hughie Closs, MD [facility]  12/06/2020, 11:29 AM   Please page via Loretha Stapler and do not message via secure chat for anything urgent. Secure chat can be used for anything non urgent and I will respond at my earliest availability.  How to contact the [facility] Attending or Consulting provider 7A - 7P or covering provider during after hours 7P -7A, for this patient?  Check the care team in [facility] and look for a) attending/consulting [facility] provider listed and b) the [facility] team listed. Page or secure chat 7A-7P. Log into [email or web address] and use [place]'s universal password to access. If you do not have the password, please contact the hospital operator. Locate the [facility] provider you are looking for under [facility] and page to a number that you can be directly reached. If you still have difficulty reaching the provider, please page the [facility] (Director on Call) for the Hospitalists listed on amion for assistance.

## 2020-12-06 NOTE — Progress Notes (Signed)
This chaplain is present to F/U with the Pt. interest in completing an Advance Directive.  The chaplain understands the Pt. wants to complete the document in the hospital.   Pt. AD education has been completed. Pt requests assistance with filling out the document and facilitating the notary on Wednesday.

## 2020-12-06 NOTE — Progress Notes (Signed)
Interventional Radiology Progress Note   30 yo female admitted with known MRSA bacteremia, and recent CT showing bilateral hip muscle edema/fluid, and bony changes of the right SI joint.  Also has CT changes of the left kidney of pyelonephritis.   Discussed with Dr. Elinor Parkinson  Regarding the question of possible source control, there is no CT finding of large abscess that would be amenable to image guided therapy.   Call with questions/concerns.   Signed,  Yvone Neu. Loreta Ave, DO

## 2020-12-07 DIAGNOSIS — R112 Nausea with vomiting, unspecified: Secondary | ICD-10-CM | POA: Diagnosis not present

## 2020-12-07 LAB — BASIC METABOLIC PANEL
Anion gap: 7 (ref 5–15)
BUN: 5 mg/dL — ABNORMAL LOW (ref 6–20)
CO2: 27 mmol/L (ref 22–32)
Calcium: 8.5 mg/dL — ABNORMAL LOW (ref 8.9–10.3)
Chloride: 101 mmol/L (ref 98–111)
Creatinine, Ser: <0.3 mg/dL — ABNORMAL LOW (ref 0.44–1.00)
Glucose, Bld: 111 mg/dL — ABNORMAL HIGH (ref 70–99)
Potassium: 3.3 mmol/L — ABNORMAL LOW (ref 3.5–5.1)
Sodium: 135 mmol/L (ref 135–145)

## 2020-12-07 LAB — GLUCOSE, CAPILLARY
Glucose-Capillary: 107 mg/dL — ABNORMAL HIGH (ref 70–99)
Glucose-Capillary: 118 mg/dL — ABNORMAL HIGH (ref 70–99)
Glucose-Capillary: 111 mg/dL — ABNORMAL HIGH (ref 70–99)
Glucose-Capillary: 115 mg/dL — ABNORMAL HIGH (ref 70–99)
Glucose-Capillary: 113 mg/dL — ABNORMAL HIGH (ref 70–99)

## 2020-12-07 MED ORDER — POTASSIUM CHLORIDE CRYS ER 20 MEQ PO TBCR
40.0000 meq | EXTENDED_RELEASE_TABLET | Freq: Once | ORAL | Status: AC
  Administered 2020-12-07: 40 meq via ORAL
  Filled 2020-12-07: qty 2

## 2020-12-07 MED ORDER — CEFTRIAXONE IV (FOR PTA / DISCHARGE USE ONLY): 2.0000 g | INTRAVENOUS | 0 refills | Status: AC

## 2020-12-07 MED ORDER — VANCOMYCIN IV (FOR PTA / DISCHARGE USE ONLY): 1750.0000 mg | Freq: Two times a day (BID) | INTRAVENOUS | 0 refills | Status: AC

## 2020-12-07 MED ORDER — ENSURE MAX PROTEIN PO LIQD
11.0000 [oz_av] | Freq: Every day | ORAL | Status: DC
  Administered 2020-12-07: 11 [oz_av] via ORAL

## 2020-12-07 MED ORDER — GLUCERNA SHAKE PO LIQD: 237.0000 mL | Freq: Two times a day (BID) | ORAL | Status: DC

## 2020-12-07 MED ORDER — HYDROMORPHONE HCL 1 MG/ML IJ SOLN
0.5000 mg | INTRAMUSCULAR | Status: DC | PRN
  Administered 2020-12-07 – 2020-12-09 (×12): 0.5 mg via INTRAVENOUS
  Filled 2020-12-07 (×12): qty 0.5

## 2020-12-07 MED ORDER — VANCOMYCIN HCL 1750 MG/350ML IV SOLN
1750.0000 mg | Freq: Two times a day (BID) | INTRAVENOUS | Status: DC
  Administered 2020-12-07 – 2020-12-09 (×5): 1750 mg via INTRAVENOUS
  Filled 2020-12-07 (×6): qty 350

## 2020-12-07 NOTE — Progress Notes (Signed)
[facility] Infectious Diseases Follow Up Note  Patient Identification: Patient Name: Chelsea Mathis MRN: [id number] Admit Date: 12/03/2020 11:02 AM Age: 30 y.o.Today's Date: 12/07/2020   Reason for Visit: MRSA bacteremia  Principal Problem:   Intractable nausea and vomiting Active Problems:   Bacteremia due to Staphylococcus aureus   DM2 (diabetes mellitus, type 2) (HCC)   Sepsis (HCC)   Myositis   Acute bacterial endocarditis   Septic arthritis of sacroiliac joint (HCC)   Chronic pain syndrome  Antibiotics: Vancomycin 9/3- current                     Ceftriaxone 9/3 -current   Lines/Tubes: Rt arm PICC line   Interval Events: continues to be afebrile, No leukocytosis, No surgical intervention per Ortho/IR   Assessment Left hip periarticular abscess Right SI joint septic arthritis ( new)/ill-defined fluid in the right periarticular musculature     - Bilateral hip pain is stable  MRSA TV endocarditis complicated by pulmonary septic emboli Small-to-moderate right pleural effusion Concern for pyelonephritis in left kidney and CT: Unremarkable UA, most likely related to MRSA infection.  Intractable nausea/vomiting and abdominal pain - nausea and vomiting has resolved, abdominal pain is improving. Lipase, LFTs WNL.  Recommendations Continue vancomycin, pharmacy to dose. New end date would be 4 weeks from 9/3 I.e 12/31/20 Continue ceftriaxone for 7-10 days total for possible pyelonephritis given CT findings. Patient is convinced that she has UTI and ceftriaxone given in the ED made her feel better. She also tells me her nausea/vomiting has resolved after adding ceftriaxone yesterday with improving abdominal pain. Monitor CBC, CMP and vancomycin trough  ID pharmacy to place OPAT orders. Patient has a follow up with [facility] ( myself) ID will sign off for now, please call with questions   Rest of the management as per the primary  team. Thank you for the consult. Please page with pertinent questions or concerns.  ______________________________________________________________________ Subjective patient seen and examined at the bedside. She feels better, nausea and vomiting has resolved.. Abdominal pain is improving. Bilateral hip pain is stable   Vitals BP 122/80 (BP Location: Left Arm)   Pulse 73   Temp 98.3 F (36.8 C) (Oral)   Resp 14   SpO2 98%     Physical Exam Constitutional:  Looks comfortable, not in acute distress    Comments:   Cardiovascular:     Rate and Rhythm: Normal rate and regular rhythm.     Heart sounds:   Pulmonary:     Effort: Pulmonary effort is normal.     Comments: clear lung sounds bilaterally   Abdominal:     Palpations: Abdomen is soft.     Tenderness: generalised mild tenderness but no RT and guarding  Musculoskeletal:        General: No swelling or tenderness in peripheral joints, ROM of bilateral hips is OK although somewhat restricted to pain.   Skin:    Comments: No lesions or rashes   Neurological:     General: No focal deficit present. Awake, alert and oriented   Psychiatric:        Mood and Affect: Calm and cooperative   Pertinent Microbiology Results for orders placed or performed during the hospital encounter of 12/03/20  Blood Culture (routine x 2)     Status: None (Preliminary result)   Collection Time: 12/03/20 12:06 PM   Specimen: BLOOD  Result Value Ref Range Status   Specimen Description BLOOD BLOOD RIGHT HAND  Final  Special Requests   Final    BOTTLES DRAWN AEROBIC AND ANAEROBIC Blood Culture results may not be optimal due to an inadequate volume of blood received in culture bottles   Culture   Final    NO GROWTH 4 DAYS Performed at [facility], [address], [place], [place] [address]    Report Status PENDING  Incomplete  Blood Culture (routine x 2)     Status: None (Preliminary result)   Collection Time: 12/03/20 12:11 PM    Specimen: BLOOD  Result Value Ref Range Status   Specimen Description BLOOD RIGHT ANTECUBITAL  Final   Special Requests   Final    BOTTLES DRAWN AEROBIC AND ANAEROBIC Blood Culture adequate volume   Culture   Final    NO GROWTH 4 DAYS Performed at [facility], [address] [place], [place] [address]    Report Status PENDING  Incomplete  Urine Culture     Status: Abnormal   Collection Time: 12/03/20  8:54 PM   Specimen: Urine, Clean Catch  Result Value Ref Range Status   Specimen Description URINE, CLEAN CATCH  Final   Special Requests   Final    NONE Performed at [facility], [address], [place], [place] [address]    Culture MULTIPLE SPECIES PRESENT, SUGGEST RECOLLECTION (A)  Final   Report Status 12/05/2020 FINAL  Final  SARS CORONAVIRUS 2 (TAT 6-24 HRS) Nasopharyngeal Nasopharyngeal Swab     Status: None   Collection Time: 12/03/20  9:47 PM   Specimen: Nasopharyngeal Swab  Result Value Ref Range Status   SARS Coronavirus 2 NEGATIVE NEGATIVE Final    Comment: (NOTE) SARS-CoV-2 target nucleic acids are NOT DETECTED.  The SARS-CoV-2 RNA is generally detectable in upper and lower respiratory specimens during the acute phase of infection. Negative results do not preclude SARS-CoV-2 infection, do not rule out co-infections with other pathogens, and should not be used as the sole basis for treatment or other patient management decisions. Negative results must be combined with clinical observations, patient history, and epidemiological information. The expected result is Negative.  Fact Sheet for Patients: HairSlick.no  Fact Sheet for Healthcare Providers: quierodirigir.com  This test is not yet approved or cleared by the [place] FDA and  has been authorized for detection and/or diagnosis of SARS-CoV-2 by FDA under an Emergency Use Authorization (EUA). This EUA will remain  in effect (meaning this  test can be used) for the duration of the COVID-19 declaration under Se ction 564(b)(1) of the Act, 21 U.S.C. section 360bbb-3(b)(1), unless the authorization is terminated or revoked sooner.  Performed at [facility], [address], [place], [place] [address]   MRSA Next Gen by PCR, Nasal     Status: None   Collection Time: 12/04/20  7:47 AM   Specimen: Nasal Mucosa; Nasal Swab  Result Value Ref Range Status   MRSA by PCR Next Gen NOT DETECTED NOT DETECTED Final    Comment: (NOTE) The GeneXpert MRSA Assay (FDA approved for NASAL specimens only), is one component of a comprehensive MRSA colonization surveillance program. It is not intended to diagnose MRSA infection nor to guide or monitor treatment for MRSA infections. Test performance is not FDA approved in patients less than 59 years old. Performed at [facility], [address] [place], [place] [address]      Pertinent Lab. CBC Latest Ref Rng & Units 12/06/2020 12/05/2020 12/04/2020  WBC 4.0 - 10.5  K/uL 9.4 10.9(H) 13.3(H)  Hemoglobin 12.0 - 15.0 g/dL 3.4(L) 9.3(X) 9.0(W)  Hematocrit 36.0 - 46.0 % 29.1(L) 28.5(L) 27.9(L)  Platelets 150 - 400 K/uL 521(H) 525(H) 536(H)   CMP Latest Ref Rng & Units 12/06/2020 12/05/2020 12/04/2020  Glucose 70 - 99 mg/dL 77 409(B) 353(G)  BUN 6 - 20 mg/dL <9(J) <2(E) 3(L)  Creatinine 0.44 - 1.00 mg/dL 2.68(T) 4.19(Q) 2.22(L)  Sodium 135 - 145 mmol/L 136 136 133(L)  Potassium 3.5 - 5.1 mmol/L 3.0(L) 2.8(L) 3.0(L)  Chloride 98 - 111 mmol/L 102 99 100  CO2 22 - 32 mmol/L 27 27 21(L)  Calcium 8.9 - 10.3 mg/dL 7.9(G) 9.2(J) 1.9(E)  Total Protein 6.5 - 8.1 g/dL 7.2 - 7.7  Total Bilirubin 0.3 - 1.2 mg/dL 0.5 - 1.0  Alkaline Phos 38 - 126 U/L 75 - 82  AST 15 - 41 U/L 17 - 15  ALT 0 - 44 U/L 8 - 8     Pertinent Imaging today Plain films and CT images have been personally visualized and interpreted; radiology reports have been reviewed. Decision making incorporated into the Impression /  Recommendations.  US abdomen 12/05/20 IMPRESSION: 1. Gallbladder sludge without gallstones or acute cholecystitis no biliary dilatation. 2. Mildly heterogeneous liver parenchyma without focal abnormality.   I spent more than 35 minutes for this patient encounter including review of prior medical records, coordination of care  with greater than 50% of time being face to face/counseling and discussing diagnostics/treatment plan with the patient/family.  Electronically signed by:   Odette Fraction, MD Infectious Disease Physician [facility] for Infectious Disease Pager: [phone]

## 2020-12-07 NOTE — Progress Notes (Signed)
PROGRESS NOTE    Chelsea Mathis  ZOX:[id number] DOB: [birth year] DOA: 12/03/2020 PCP: Pcp, No   Brief Narrative:  Chelsea Mathis is a 30 y.o. female with medical history significant of DM2. 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  Admitted to our service from 8/1-8/12 followed by stay in rehab from 8/12-8/30. Pt presented with severe sepsis as well as DKA that admission. Multiple blood cultures positive for MRSA, pt put on IV vancomycin which she continues at home through PICC line. Due to myositis she also developed severe hip / pelvis pain which has been treated with Percocet PRN + Oxycontin scheduled. She has been taking the Oxycontin since discharge though she has held the percocet due to running low on this medication.  This time patient presented with intractable N/V. Upon arrival to ED, she was hemodynamically stable with WBC 12k.   CT A/P: 1) new since 8/1 R SI joint septic arthritis. 2) small Thin crescentic fluid collection in the left hip periarticular musculature measuring 3.9 x 5 mm, suspicious for abscess. Small amount of ill-defined fluid in the right hip periarticular musculature. 3) ? L pyelonephritis  Lactate 1.9. Given 2L LR bolus. Given Rocephin for possible superimposed pyelonephritis.Vanc continued for MRSA bacteremia, endocarditis, myositis.  Assessment & Plan:   Principal Problem:   Intractable nausea and vomiting Active Problems:   Bacteremia due to Staphylococcus aureus   DM2 (diabetes mellitus, type 2) (HCC)   Sepsis (HCC)   Myositis   Acute bacterial endocarditis   Septic arthritis of sacroiliac joint (HCC)   Chronic pain syndrome  Intractable nausea and vomiting: Resolved.  No further nausea and vomiting and in fact she is requesting to advance her to regular diet.  She believes that her nausea vomiting improved since Rocephin was placed back.  MRSA bacteremia, endocarditis and myositis: Continue vancomycin, ID on board. OPAT orders  signed.  Sepsis secondary to septic arthritis of Right SI joint/left hip septic arthritis and possible abscess, POA: IR was consulted and per them, patient does not have enough fluid at the left hip joint to be drained.  Ortho was consulted per ID recommendations.  Ortho does not recommend any surgical intervention.  Consult was placed for IR to aspirate SI joint.  But again per IR, there is not enough fluid to drain.  ID was called directly.  Continue antibiotics per ID.  Possible left pyelonephritis: As discussed above.  Patient on Rocephin now.  Hypokalemia: Replaced yesterday.  Checking again today.  Type 2 diabetes mellitus: Patient did not meet any criteria of DKA.  Recent hemoglobin A1c on 10/31/2020 was 12.3.  She was discharged on 24 units of Lantus from rehab.  She is only on SSI here and Lantus 15 units.  Blood sugar controlled.  Continue this regimen.  DVT prophylaxis: enoxaparin (LOVENOX) injection 40 mg Start: 12/03/20 2330   Code Status: Full Code  Family Communication: Mother present at bedside.    Status is: Inpatient  Remains inpatient appropriate because:Inpatient level of care appropriate due to severity of illness  Dispo: The patient is from: Home              Anticipated d/c is to: Home with home health              Patient currently is not medically stable to d/c.   Difficult to place patient No   Estimated body mass index is 32.47 kg/m as calculated from the following:   Height as of 11/11/20: 5\' 4"  (1.626  m).   Weight as of 11/29/20: 85.8 kg.   Nutritional Assessment: There is no height or weight on file to calculate BMI.. Seen by dietician.  I agree with the assessment and plan as outlined below: Nutrition Status: Nutrition Problem: Inadequate oral intake Etiology: poor appetite, nausea, vomiting Skin Assessment: I have examined the patient's skin and I agree with the wound assessment as performed by the wound care RN as outlined below:    Consultants:   ID IR  Procedures:  None  Antimicrobials:  Anti-infectives (From admission, onward)    Start     Dose/Rate Route Frequency Ordered Stop   12/07/20 2100  vancomycin (VANCOREADY) IVPB 1750 mg/350 mL        1,750 mg 175 mL/hr over 120 Minutes Intravenous 2 times daily 12/07/20 1032     12/07/20 0000  vancomycin IVPB        1,750 mg Intravenous Every 12 hours 12/07/20 1054 12/31/20 2359   12/06/20 1030  cefTRIAXone (ROCEPHIN) 2 g in sodium chloride 0.9 % 100 mL IVPB        2 g 200 mL/hr over 30 Minutes Intravenous Every 24 hours 12/06/20 0919 12/12/20 2359   12/03/20 2100  vancomycin (VANCOREADY) IVPB 1750 mg/350 mL  Status:  Discontinued        1,750 mg 175 mL/hr over 120 Minutes Intravenous 2 times daily 12/03/20 2049 12/07/20 1032   12/03/20 2030  cefTRIAXone (ROCEPHIN) 2 g in sodium chloride 0.9 % 100 mL IVPB        2 g 200 mL/hr over 30 Minutes Intravenous  Once 12/03/20 2018 12/03/20 2110          Subjective: Seen and examined.  She feels much better today.  No nausea or vomiting.  Wants to advance her diet to regular diet.  No other complaint.  Objective: Vitals:   12/06/20 0822 12/06/20 1653 12/06/20 2231 12/07/20 0823  BP: (!) 164/88 127/82 130/82 122/80  Pulse: 85 85 92 73  Resp: 16 14 18 14   Temp: 97.7 F (36.5 C) 98.1 F (36.7 C) 98.1 F (36.7 C) 98.3 F (36.8 C)  TempSrc: Oral Oral Oral Oral  SpO2: 100% 100% 98% 98%    Intake/Output Summary (Last 24 hours) at 12/07/2020 1057 Last data filed at 12/06/2020 1700 Gross per 24 hour  Intake 240 ml  Output --  Net 240 ml    There were no vitals filed for this visit.  Examination:  General exam: Appears calm and comfortable  Respiratory system: Clear to auscultation. Respiratory effort normal. Cardiovascular system: S1 & S2 heard, RRR. No JVD, murmurs, rubs, gallops or clicks. No pedal edema. Gastrointestinal system: Abdomen is nondistended, soft and minimal epigastric tenderness. No organomegaly or  masses felt. Normal bowel sounds heard. Central nervous system: Alert and oriented. No focal neurological deficits. Extremities: Symmetric 5 x 5 power. Skin: No rashes, lesions or ulcers.  Psychiatry: Judgement and insight appear normal. Mood & affect appropriate.    Data Reviewed: I have personally reviewed following labs and imaging studies  CBC: Recent Labs  Lab 12/03/20 1206 12/03/20 1344 12/04/20 0554 12/05/20 0345 12/06/20 0318  WBC 12.0*  --  13.3* 10.9* 9.4  NEUTROABS 10.0*  --   --  7.0 5.3  HGB 10.6* 12.6 8.7* 8.7* 8.9*  HCT 34.9* 37.0 27.9* 28.5* 29.1*  MCV 83.9  --  81.6 82.4 82.4  PLT 508*  --  536* 525* 521*    Basic Metabolic Panel: Recent Labs  Lab  12/03/20 1206 12/03/20 1344 12/04/20 0554 12/05/20 0345 12/06/20 0318  NA 136 140 133* 136 136  K 3.4* 3.6 3.0* 2.8* 3.0*  CL 101  --  100 99 102  CO2 23  --  21* 27 27  GLUCOSE 174*  --  155* 110* 77  BUN <5*  --  3* <5* <5*  CREATININE 0.43*  --  0.40* 0.32* 0.31*  CALCIUM 9.1  --  8.8* 8.8* 8.6*  MG  --   --   --   --  1.5*    GFR: Estimated Creatinine Clearance: 106.9 mL/min (A) (by C-G formula based on SCr of 0.31 mg/dL (L)). Liver Function Tests: Recent Labs  Lab 12/03/20 1206 12/04/20 0554 12/06/20 0951  AST 16 15 17   ALT 11 8 8   ALKPHOS 110 82 75  BILITOT 1.1 1.0 0.5  PROT 9.2* 7.7 7.2  ALBUMIN 2.7* 2.3* 2.3*    Recent Labs  Lab 12/06/20 0951  LIPASE 45   No results for input(s): AMMONIA in the last 168 hours. Coagulation Profile: Recent Labs  Lab 12/03/20 1206  INR 1.3*    Cardiac Enzymes: Recent Labs  Lab 12/03/20 1206  CKTOTAL 25*    BNP (last 3 results) No results for input(s): PROBNP in the last 8760 hours. HbA1C: No results for input(s): HGBA1C in the last 72 hours. CBG: Recent Labs  Lab 12/06/20 1649 12/06/20 2014 12/06/20 2354 12/07/20 0418 12/07/20 0800  GLUCAP 92 109* 108* 107* 118*    Lipid Profile: No results for input(s): CHOL, HDL, LDLCALC,  TRIG, CHOLHDL, LDLDIRECT in the last 72 hours. Thyroid Function Tests: No results for input(s): TSH, T4TOTAL, FREET4, T3FREE, THYROIDAB in the last 72 hours. Anemia Panel: No results for input(s): VITAMINB12, FOLATE, FERRITIN, TIBC, IRON, RETICCTPCT in the last 72 hours. Sepsis Labs: Recent Labs  Lab 12/03/20 1334 12/03/20 2310  LATICACIDVEN 1.9 0.9     Recent Results (from the past 240 hour(s))  Blood Culture (routine x 2)     Status: None (Preliminary result)   Collection Time: 12/03/20 12:06 PM   Specimen: BLOOD  Result Value Ref Range Status   Specimen Description BLOOD BLOOD RIGHT HAND  Final   Special Requests   Final    BOTTLES DRAWN AEROBIC AND ANAEROBIC Blood Culture results may not be optimal due to an inadequate volume of blood received in culture bottles   Culture   Final    NO GROWTH 4 DAYS Performed at [facility], [address] [place], [place] [address]    Report Status PENDING  Incomplete  Blood Culture (routine x 2)     Status: None (Preliminary result)   Collection Time: 12/03/20 12:11 PM   Specimen: BLOOD  Result Value Ref Range Status   Specimen Description BLOOD RIGHT ANTECUBITAL  Final   Special Requests   Final    BOTTLES DRAWN AEROBIC AND ANAEROBIC Blood Culture adequate volume   Culture   Final    NO GROWTH 4 DAYS Performed at [facility], [address] [place], [place] [address]    Report Status PENDING  Incomplete  Urine Culture     Status: Abnormal   Collection Time: 12/03/20  8:54 PM   Specimen: Urine, Clean Catch  Result Value Ref Range Status   Specimen Description URINE, CLEAN CATCH  Final   Special Requests   Final    NONE Performed at [facility], [address] [place], [place] [address]  Culture MULTIPLE SPECIES PRESENT, SUGGEST RECOLLECTION (A)  Final   Report Status 12/05/2020 FINAL  Final  SARS CORONAVIRUS 2 (TAT 6-24 HRS) Nasopharyngeal Nasopharyngeal Swab     Status: None   Collection Time:  12/03/20  9:47 PM   Specimen: Nasopharyngeal Swab  Result Value Ref Range Status   SARS Coronavirus 2 NEGATIVE NEGATIVE Final    Comment: (NOTE) SARS-CoV-2 target nucleic acids are NOT DETECTED.  The SARS-CoV-2 RNA is generally detectable in upper and lower respiratory specimens during the acute phase of infection. Negative results do not preclude SARS-CoV-2 infection, do not rule out co-infections with other pathogens, and should not be used as the sole basis for treatment or other patient management decisions. Negative results must be combined with clinical observations, patient history, and epidemiological information. The expected result is Negative.  Fact Sheet for Patients: HairSlick.no  Fact Sheet for Healthcare Providers: quierodirigir.com  This test is not yet approved or cleared by the [place] FDA and  has been authorized for detection and/or diagnosis of SARS-CoV-2 by FDA under an Emergency Use Authorization (EUA). This EUA will remain  in effect (meaning this test can be used) for the duration of the COVID-19 declaration under Se ction 564(b)(1) of the Act, 21 U.S.C. section 360bbb-3(b)(1), unless the authorization is terminated or revoked sooner.  Performed at [facility], [address] [place], [place] [address]   MRSA Next Gen by PCR, Nasal     Status: None   Collection Time: 12/04/20  7:47 AM   Specimen: Nasal Mucosa; Nasal Swab  Result Value Ref Range Status   MRSA by PCR Next Gen NOT DETECTED NOT DETECTED Final    Comment: (NOTE) The GeneXpert MRSA Assay (FDA approved for NASAL specimens only), is one component of a comprehensive MRSA colonization surveillance program. It is not intended to diagnose MRSA infection nor to guide or monitor treatment for MRSA infections. Test performance is not FDA approved in patients less than 32 years old. Performed at [facility], [address] [place], [place] [address]        Radiology Studies: US Abdomen Limited RUQ (LIVER/GB)  Result Date: 12/05/2020 CLINICAL DATA:  Abdominal pain. EXAM: ULTRASOUND ABDOMEN LIMITED RIGHT UPPER QUADRANT COMPARISON:  CT 12/03/2020 FINDINGS: Gallbladder: Physiologically distended with layering sludge. No gallstones or wall thickening visualized. No sonographic Murphy sign noted by sonographer. Common bile duct: Diameter: 4 mm, normal. Liver: Mildly heterogeneous in parenchymal echogenicity but no discrete focal lesion. No capsular nodularity. Portal vein is patent on color Doppler imaging with normal direction of blood flow towards the liver. Other: No right upper quadrant ascites IMPRESSION: 1. Gallbladder sludge without gallstones or acute cholecystitis no biliary dilatation. 2. Mildly heterogeneous liver parenchyma without focal abnormality. Electronically Signed   By: Narda Rutherford M.D.   On: 12/05/2020 18:37    Scheduled Meds:  Chlorhexidine Gluconate Cloth  6 each Topical Daily   diclofenac Sodium  2 g Topical QID   enoxaparin (LOVENOX) injection  40 mg Subcutaneous QHS   feeding supplement (GLUCERNA SHAKE)  237 mL Oral BID BM   gabapentin  300 mg Oral TID   insulin aspart  0-9 Units Subcutaneous Q4H   insulin glargine-yfgn  15 Units Subcutaneous Daily   multivitamin with minerals  1 tablet Oral Daily   pantoprazole (PROTONIX) IV  40 mg Intravenous Q24H   propranolol  10 mg Oral Daily   Ensure Max Protein  11 oz Oral QHS   pyridOXINE  50 mg Oral Daily   senna-docusate  1 tablet Oral BID   tiZANidine  2 mg Oral TID   Continuous Infusions:  cefTRIAXone (ROCEPHIN)  IV 2 g (12/06/20 1238)   lactated ringers 100 mL/hr at 12/07/20 0906   promethazine (PHENERGAN) injection (IM or IVPB) 12.5 mg (12/06/20 0922)   vancomycin       LOS: 3 days   Time spent: 28 minutes  Hughie Closs, MD [facility]  12/07/2020, 10:57 AM  Please page via Lore[facility] Stapler and do not message via secure chat for  anything urgent. Secure chat can be used for anything non urgent and I will respond at my earliest availability.  How to contact the [facility] Attending or Consulting provider 7A - 7P or covering provider during after hours 7P -7A, for this patient?  Check the care team in [facility] and look for a) attending/consulting TR[facility] provider listed and b) the [facility] team listed. Page or secure chat 7A-7P. Log into [email or web address] and use [facility] universal password to access. If you do not have the password, please contact the hospital operator. Locate the [facility] provider you are looking for under [facility] and page to a number that you can be directly reached. If you still have difficulty reaching the provider, please page the [facility] (Director on Call) for the Hospitalists listed on amion for assistance.

## 2020-12-07 NOTE — Progress Notes (Signed)
Nutrition Follow-up  DOCUMENTATION CODES:   Obesity unspecified  INTERVENTION:   -D/c Boost Breeze -D/c Prosource Plus -Continue MVI with minerals daily -Glucerna Shake po BID, each supplement provides 220 kcal and 10 grams of protein  -Ensure Max po daily, each supplement provides 150 kcal and 30 grams of protein -Advance diet to solid foods; case discussed with MD who gave RD permission to advance diet   NUTRITION DIAGNOSIS:   Inadequate oral intake related to poor appetite, nausea, vomiting as evidenced by meal completion < 25%, per patient/family report.  Ongoing  GOAL:   Patient will meet greater than or equal to 90% of their needs  Progressing   MONITOR:   PO intake, Supplement acceptance, Diet advancement, Labs, Weight trends, Skin, I & O's  REASON FOR ASSESSMENT:   Malnutrition Screening Tool    ASSESSMENT:   Samone Chiasson is a 30 y.o. female with medical history significant of DM2.   9/4- per IR notes, intramuscular fluid collection of lt hip too small to drain 9/6- CT revealed lt kidney pyelonephritis, no abscess amenable to imaging interventions  Reviewed I/O's: +240 ml x 24 hours and +4 L since admission  Spoke with pt and mom. They reports that nausea and vomiting have improved. Pt shared that the clear liquids "no longer hold her" and would like to try solid foods. Case discussed with MD; received permission to advance diet.   Labs reviewed: CBGS: 92-118 (inpatient orders for glycemic control are 0-9 units insulin aspart every 4 hours and 15 units insulin glargine-yfgn daily).    Diet Order:   Diet Order             Diet Carb Modified Fluid consistency: Thin; Room service appropriate? Yes  Diet effective now                   EDUCATION NEEDS:   Education needs have been addressed  Skin:  Skin Assessment: Reviewed RN Assessment  Last BM:  12/03/20  Height:   Ht Readings from Last 1 Encounters:  11/11/20 5\' 4"  (1.626 m)    Weight:    Wt Readings from Last 1 Encounters:  11/29/20 85.8 kg    Ideal Body Weight:  54.5 kg  BMI:  There is no height or weight on file to calculate BMI.  Estimated Nutritional Needs:   Kcal:  1700-1900  Protein:  105-110 grams  Fluid:  > 1.7 L    12/01/20, RD, LDN, CDCES Registered Dietitian II Certified Diabetes Care and Education Specialist Please refer to [facility] for RD and/or RD on-call/weekend/after hours pager

## 2020-12-07 NOTE — Progress Notes (Signed)
Pharmacy Antibiotic Note  Chelsea Mathis is a 30 y.o. female admitted on 12/03/2020 with  MRSA endocarditis, continuation of outpatient abx .  Pharmacy has been consulted for vancomycin dosing.  Vanc level were therapeutic on last check on 9/4. SCr stable.   Plan: -Continue Vancomycin 1750mg  IV q12h. Per ID, new end date is 12/31/20  -Will check biweekly SCr and weekly vanc levels until then    Temp (24hrs), Avg:98.2 F (36.8 C), Min:98.1 F (36.7 C), Max:98.3 F (36.8 C)  Recent Labs  Lab 12/03/20 1206 12/03/20 1334 12/03/20 2310 12/04/20 0554 12/04/20 1200 12/04/20 2030 12/05/20 0345 12/06/20 0318  WBC 12.0*  --   --  13.3*  --   --  10.9* 9.4  CREATININE 0.43*  --   --  0.40*  --   --  0.32* 0.31*  LATICACIDVEN  --  1.9 0.9  --   --   --   --   --   VANCOTROUGH  --   --   --   --   --  10*  --   --   VANCOPEAK  --   --   --   --  32  --   --   --      Estimated Creatinine Clearance: 106.9 mL/min (A) (by C-G formula based on SCr of 0.31 mg/dL (L)).    No Known Allergies   02/05/21, PharmD., BCPS, BCCCP Clinical Pharmacist Please refer to [facility] for unit-specific pharmacist

## 2020-12-07 NOTE — Progress Notes (Signed)
   12/07/20 1445  Clinical Encounter Type  Visited With Patient  Visit Type Follow-up  Referral From Chaplain  Consult/Referral To Chaplain   Chaplain facilitated notarization of AD. Original and 2 copies were placed on shelf per Pt's instruction. A copy was also put into Pt's physical file. Chaplain remains available.   This note was prepared by Chaplain Resident, Tacy Learn, MDiv. Chaplain remains available as needed through the on-call pager: [phone].

## 2020-12-07 NOTE — Evaluation (Signed)
Physical Therapy Evaluation and Discharge Patient Details Name: Chelsea Mathis MRN: [id number] DOB: [birth year] Today's Date: 12/07/2020   History of Present Illness  Pt is a 30 y/o female admitted secondary to intractable nausea and vomiting. Imaging showed continued right thigh/hip myositis and new possible right SI septic arthritis. Recent admission for myositis and MRSA endocarditits. PMH includes DM.  Clinical Impression  Pt admitted secondary to problem above with deficits below. Pt overall at a mod I level for transfers and gait using RW this session. Pt reports feeling more comfortable with RW, so recommending one for d/c. Pt also reports she was supposed to receive a tub bench prior to d/c during last admission, however, has not received one yet; will need to ensure pt receives that as well. Recommending HHPT at d/c and all further needs can be addressed with HHPT. Will sign off. If needs change, please re-consult.     Follow Up Recommendations Home health PT (resume HHPT)    Equipment Recommendations  Rolling walker with 5" wheels;Other (comment) (tub bench (was supposed to get during last admission, but has not received yet))    Recommendations for Other Services       Precautions / Restrictions Precautions Precautions: Fall Restrictions Weight Bearing Restrictions: No      Mobility  Bed Mobility               General bed mobility comments: Sitting in chair in bathroom upon entry    Transfers Overall transfer level: Modified independent Equipment used: Rolling walker (2 wheeled)                Ambulation/Gait Ambulation/Gait assistance: Modified independent (Device/Increase time) Gait Distance (Feet): 40 Feet Assistive device: Rolling walker (2 wheeled) Gait Pattern/deviations: Decreased step length - right;Decreased step length - left;Decreased stride length;Step-through pattern;Antalgic Gait velocity: Decreased   General Gait Details: Guarded,  antalgic gait, but no overt LOB noted. Pt reporting feeling more comfortable with RW and would like to have one for home.  Stairs            Wheelchair Mobility    Modified Rankin (Stroke Patients Only)       Balance Overall balance assessment: Mild deficits observed, not formally tested                                           Pertinent Vitals/Pain Pain Assessment: Faces Faces Pain Scale: Hurts little more Pain Location: pelvis Pain Descriptors / Indicators: Grimacing;Guarding Pain Intervention(s): Limited activity within patient's tolerance;Monitored during session;Repositioned    Home Living Family/patient expects to be discharged to:: Private residence Living Arrangements: Spouse/significant other Available Help at Discharge: Family;Available 24 hours/day Type of Home: House Home Access: Stairs to enter Entrance Stairs-Rails: None Entrance Stairs-Number of Steps: 1 Home Layout: Bed/bath upstairs;1/2 bath on main level;Two level Home Equipment: Walker - 4 wheels;Bedside commode      Prior Function Level of Independence: Independent with assistive device(s)         Comments: Has been using rollator for ambulation. Has been able to sponge bathe.     Hand Dominance   Dominant Hand: Right    Extremity/Trunk Assessment   Upper Extremity Assessment Upper Extremity Assessment: Overall WFL for tasks assessed    Lower Extremity Assessment Lower Extremity Assessment: Generalized weakness    Cervical / Trunk Assessment Cervical / Trunk Assessment: Normal  Communication  Communication: No difficulties  Cognition Arousal/Alertness: Awake/alert Behavior During Therapy: WFL for tasks assessed/performed Overall Cognitive Status: Within Functional Limits for tasks assessed                                        General Comments General comments (skin integrity, edema, etc.): Pt's mother present during session. REports  HHPT was scheduled to start after d/c from CIR, however, was unable to start prior to being re-admitted.    Exercises     Assessment/Plan    PT Assessment All further PT needs can be met in the next venue of care  PT Problem List Decreased strength;Decreased mobility;Decreased activity tolerance;Decreased knowledge of use of DME;Decreased knowledge of precautions;Pain       PT Treatment Interventions      PT Goals (Current goals can be found in the Care Plan section)  Acute Rehab PT Goals Patient Stated Goal: to go home PT Goal Formulation: With patient/family Time For Goal Achievement: 12/07/20 Potential to Achieve Goals: Good    Frequency     Barriers to discharge        Co-evaluation               AM-PAC PT "6 Clicks" Mobility  Outcome Measure Help needed turning from your back to your side while in a flat bed without using bedrails?: None Help needed moving from lying on your back to sitting on the side of a flat bed without using bedrails?: None Help needed moving to and from a bed to a chair (including a wheelchair)?: None Help needed standing up from a chair using your arms (e.g., wheelchair or bedside chair)?: None Help needed to walk in hospital room?: None Help needed climbing 3-5 steps with a railing? : A Little 6 Click Score: 23    End of Session   Activity Tolerance: Patient tolerated treatment well Patient left: in chair;with call bell/phone within reach;with family/visitor present Nurse Communication: Mobility status PT Visit Diagnosis: Other abnormalities of gait and mobility (R26.89);Difficulty in walking, not elsewhere classified (R26.2)    Time: 1624-4695 PT Time Calculation (min) (ACUTE ONLY): 12 min   Charges:   PT Evaluation $PT Eval Low Complexity: 1 Low          Chelsea Mathis, DPT  Acute Rehabilitation Services  Pager: [phone] Office: [phone]   Rudean Hitt 12/07/2020, 10:54 AM

## 2020-12-07 NOTE — Progress Notes (Addendum)
PHARMACY CONSULT NOTE FOR:  OUTPATIENT  PARENTERAL ANTIBIOTIC THERAPY (OPAT)  Indication: MRSA IE/ New Septic Arthritis Regimen:  Vancomycin 1750 mg IV q12h Ceftriaxone 2g IV q24h x7 days (stop 12/12/20) End date: 12/31/20  IV antibiotic discharge orders are pended. To discharging provider:  please sign these orders via discharge navigator,  Select New Orders & click on the button choice - Manage This Unsigned Work.     Thank you for allowing pharmacy to be a part of this patient's care.  Shirlee More, PharmD PGY2 Infectious Diseases Pharmacy Resident   Please check [email or web address] for unit-specific pharmacy phone numbers

## 2020-12-07 NOTE — Progress Notes (Signed)
Occupational Therapy Discharge Patient Details Name: Chelsea Mathis MRN: [id number] DOB: Jul 17, 1990 Today's Date: 12/07/2020 Time:  -     Patient discharged from OT services secondary to  screening via PT [place] at this time .  Please see latest therapy progress note for current level of functioning and progress toward goals.    Progress and discharge plan discussed with patient and/or caregiver: Patient/Caregiver agrees with plan  GO     Wynona Neat, OTR/L  Acute Rehabilitation Services Pager: [phone] Office: [phone] .  12/07/2020, 10:41 AM

## 2020-12-07 NOTE — Progress Notes (Signed)
   12/07/20 1100  Clinical Encounter Type  Visited With Patient and family together  Visit Type Initial  Referral From Nurse  Consult/Referral To Chaplain   Chaplain responded to chaplain referral for notarization. Paperwork is still incomplete. Pt requested a pen and chaplain found one on unit. Pt will tell her nurse when paperwork is complete and ready for notarization.   This note was prepared by Chaplain Resident, Tacy Learn, MDiv. Chaplain remains available as needed through the on-call pager: [phone].

## 2020-12-07 NOTE — TOC Initial Note (Addendum)
Transition of Care [facility]) - Initial/Assessment Note    Patient Details  Name: Chelsea Mathis MRN: [id number] Date of Birth: July 20, 1990  [facility] of [facility]) CM/SW Contact:    Epifanio Lesches, RN Phone Number: 12/07/2020, 2:42 PM  Clinical Narrative:                 Readmitted with c/o N/V, right thigh/hip myositis and new possible right SI septic arthritis. Hx of myositis and MRSA endocardititis. From home with boyfriend. PTA independent with ADL's.  PTA active with [facility] [phone]) for IV ABX therapy, [facility] [phone]) for RN and [facility]( PT/OT). OPAT orders and ID clinicals faxed to [facility] @ [phone]. Home health RN order faxed to [facility] for resumption of RN services @ [phone].  Order noted for DME: RW and tub bench. Referral made with [employer]. Equipment will be delivered to bedside prior to d/c.  TOC team will continue to monitor and assit with needs.....  9/8 Per [employer] pt already given tub bench and rolator with insurance in the past. Pt will have to pay an out of pocket expense if RW delivered. [employer] made pt aware.  Expected Discharge Plan: Home w Home Health Services Barriers to Discharge: Continued Medical Work up   Patient Goals and CMS Choice     Choice offered to / list presented to : Patient  Expected Discharge Plan and Services Expected Discharge Plan: Home w Home Health Services   Discharge Planning Services: CM Consult   Living arrangements for the past 2 months: Single Family Home                 DME Arranged: Other see comment DME Agency: Other - Comment ([facility]) Date DME Agency Contacted: 12/06/20 Time DME Agency Contacted: 1628   HH Arranged: RN, PT, OT HH Agency: [facility] Date HH Agency Contacted: 12/06/20 Time HH Agency Contacted: 1629 Representative spoke with at [facility] Agency: Pearson Grippe  Prior Living Arrangements/Services Living arrangements for  the past 2 months: Single Family Home Lives with:: Significant Other Patient language and need for interpreter reviewed:: Yes Do you feel safe going back to the place where you live?: Yes      Need for Family Participation in Patient Care: Yes (Comment) Care giver support system in place?: Yes (comment)   Criminal Activity/Legal Involvement Pertinent to Current Situation/Hospitalization: No - Comment as needed  Activities of Daily Living Home Assistive Devices/Equipment: Walker (specify type) (4 wheel) ADL Screening (condition at time of admission) Patient's cognitive ability adequate to safely complete daily activities?: Yes Is the patient deaf or have difficulty hearing?: No Does the patient have difficulty seeing, even when wearing glasses/contacts?: No Does the patient have difficulty concentrating, remembering, or making decisions?: No Patient able to express need for assistance with ADLs?: Yes Does the patient have difficulty dressing or bathing?: Yes Independently performs ADLs?: No Communication: Independent Dressing (OT): Needs assistance Is this a change from baseline?: Pre-admission baseline Grooming: Needs assistance Is this a change from baseline?: Pre-admission baseline Feeding: Independent Bathing: Needs assistance Is this a change from baseline?: Pre-admission baseline Toileting: Needs assistance Is this a change from baseline?: Pre-admission baseline In/Out Bed: Needs assistance Is this a change from baseline?: Pre-admission baseline Walks in Home: Needs assistance Is this a change from baseline?: Pre-admission baseline Does the patient have difficulty walking or climbing stairs?: Yes Weakness of Legs: Both Weakness of Arms/Hands: Both  Permission Sought/Granted   Permission granted to  share information with : Yes, Verbal Permission Granted  Share Information with NAME: Jaretzi Droz (Mother)[phone]           Emotional Assessment Appearance::  Appears stated age Attitude/Demeanor/Rapport: Engaged Affect (typically observed): Accepting Orientation: : Oriented to Self, Oriented to Place, Oriented to  Time, Oriented to Situation Alcohol / Substance Use: Not Applicable Psych Involvement: No (comment)  Admission diagnosis:  Pyelonephritis [N12] Intractable nausea and vomiting [R11.2] Septic arthritis of vertebra, due to unspecified organism (HCC) [M46.50] Sepsis (HCC) [A41.9] Patient Active Problem List   Diagnosis Date Noted   Intractable nausea and vomiting 12/03/2020   Septic arthritis of sacroiliac joint (HCC) 12/03/2020   Chronic pain syndrome 12/03/2020   Labile blood glucose    Acute blood loss anemia    Leukocytosis    Hyponatremia    Acute bacterial endocarditis    Pain of right thigh    Adjustment disorder with mixed anxiety and depressed mood 11/10/2020   Myositis 11/04/2020   Myofasciitis 11/04/2020   Bacteremia due to Staphylococcus aureus 11/02/2020   Septic pulmonary embolism (HCC) 11/02/2020   DM2 (diabetes mellitus, type 2) (HCC) 11/02/2020   Sepsis (HCC) 11/02/2020   DKA (diabetic ketoacidosis) (HCC) 10/31/2020   Acute appendicitis 11/10/2016   Postpartum care following vaginal delivery 05/13/2015   Gestational diabetes mellitus (GDM) affecting pregnancy 05/13/2015   PCP:  Oneita Hurt, No Pharmacy:   [id number] [place], [place] - [address] AT [facility] [address] [place] [address] Phone: [phone] Fax: [phone]  [facility] #[id number] [place], [place] - [address] AT [facility] OF [address] [place] [address] Phone: [phone] Fax: [phone]  [facility] [address] [place] [address] Phone: [phone] Fax: [phone]     Social Determinants of Health (SDOH) Interventions    Readmission Risk Interventions No flowsheet data found.

## 2020-12-08 DIAGNOSIS — R112 Nausea with vomiting, unspecified: Secondary | ICD-10-CM | POA: Diagnosis not present

## 2020-12-08 LAB — BASIC METABOLIC PANEL
Anion gap: 9 (ref 5–15)
BUN: <5 mg/dL — ABNORMAL LOW (ref 6–20)
CO2: 26 mmol/L (ref 22–32)
Calcium: 8.7 mg/dL — ABNORMAL LOW (ref 8.9–10.3)
Chloride: 100 mmol/L (ref 98–111)
Creatinine, Ser: 0.3 mg/dL — ABNORMAL LOW (ref 0.44–1.00)
GFR, Estimated: >60 mL/min (ref >60)
Glucose, Bld: 111 mg/dL — ABNORMAL HIGH (ref 70–99)
Potassium: 3.2 mmol/L — ABNORMAL LOW (ref 3.5–5.1)
Sodium: 135 mmol/L (ref 135–145)

## 2020-12-08 LAB — GLUCOSE, CAPILLARY
Glucose-Capillary: 130 mg/dL — ABNORMAL HIGH (ref 70–99)
Glucose-Capillary: 97 mg/dL (ref 70–99)
Glucose-Capillary: 89 mg/dL (ref 70–99)
Glucose-Capillary: 108 mg/dL — ABNORMAL HIGH (ref 70–99)
Glucose-Capillary: 78 mg/dL (ref 70–99)
Glucose-Capillary: 115 mg/dL — ABNORMAL HIGH (ref 70–99)

## 2020-12-08 LAB — CULTURE, BLOOD (ROUTINE X 2)
Culture: NO GROWTH
Culture: NO GROWTH
Special Requests: ADEQUATE

## 2020-12-08 MED ORDER — SODIUM CHLORIDE 0.9 % IV SOLN
12.5000 mg | Freq: Four times a day (QID) | INTRAVENOUS | Status: DC | PRN
  Administered 2020-12-08 – 2020-12-09 (×3): 12.5 mg via INTRAVENOUS
  Filled 2020-12-08 (×2): qty 0.5
  Filled 2020-12-08: qty 12.5
  Filled 2020-12-08: qty 0.5
  Filled 2020-12-08: qty 12.5
  Filled 2020-12-08: qty 0.5

## 2020-12-08 MED ORDER — POTASSIUM CHLORIDE CRYS ER 20 MEQ PO TBCR
40.0000 meq | EXTENDED_RELEASE_TABLET | ORAL | Status: AC
  Administered 2020-12-08 (×2): 40 meq via ORAL
  Filled 2020-12-08 (×2): qty 2

## 2020-12-08 MED ORDER — ONDANSETRON 4 MG PO TBDP: 4.0000 mg | ORAL_TABLET | Freq: Three times a day (TID) | ORAL | 0 refills | Status: AC | PRN

## 2020-12-08 NOTE — TOC Transition Note (Signed)
Transition of Care [facility]) - CM/SW Discharge Note   Patient Details  Name: Chelsea Mathis MRN: [id number] Date of Birth: 01/18/1991  Transition of Care [facility]) CM/SW Contact:  Epifanio Lesches, RN Phone Number: 12/08/2020, 2:03 PM   Clinical Narrative:    Patient will DC to: Home  Anticipated DC date: 12/08/2020 Family notified:yes Transport by: car  Per MD patient ready for DC today.RN, patient, patient's family, [facility] [phone]), [facility] and [facility] notified of DC.[facility] order faxed to [facility] @ [phone]. NCM order received, [facility] 12/09/2020.  Pt states without PCP, NCM shared  Primary Care @ [facility]. Pt interested. Post hospital follow scheduled and to establish primary care scheduled and noted on AVS.   Pt with transportation to home .  RNCM will sign off for now as intervention is no longer needed. Please consult [place] again if new needs arise.   Final next level of care: Home w Home Health Services Barriers to Discharge: No Barriers Identified   Patient Goals and CMS Choice     Choice offered to / list presented to : Patient  Discharge Placement                       Discharge Plan and Services   Discharge Planning Services: CM Consult            DME Arranged: Other see comment DME Agency: Other - Comment ([facility]) Date DME Agency Contacted: 12/06/20 Time DME Agency Contacted: [phone]   HH Arranged: RN, PT, OT HH Agency: [facility] Date HH Agency Contacted: 12/06/20 Time HH Agency Contacted: [phone] Representative spoke with at [facility] Agency: Pearson Grippe  Social Determinants of Health (SDOH) Interventions     Readmission Risk Interventions No flowsheet data found.

## 2020-12-08 NOTE — Discharge Summary (Addendum)
Physician Discharge Summary  Chelsea Mathis NIO:[id number] DOB: 08/01/1990 DOA: 12/03/2020  PCP: Pcp, No  Admit date: 12/03/2020 Discharge date: 12/08/2020 30 Day Unplanned Readmission Risk Score    Flowsheet Row ED to Hosp-Admission (Current) from 12/03/2020 in [facility]  30 Day Unplanned Readmission Risk Score (%) 27.28 Filed at 12/08/2020 0801       This score is the patient's risk of an unplanned readmission within 30 days of being discharged (0 -100%). The score is based on dignosis, age, lab data, medications, orders, and past utilization.   Low:  0-14.9   Medium: 15-21.9   High: 22-29.9   Extreme: 30 and above          Admitted From: Home Disposition: Home  Recommendations for Outpatient Follow-up:  Follow up with PCP in 1-2 weeks Please obtain BMP/CBC in one week Follow-up with ID as they have a scheduled Please follow up with your PCP on the following pending results: Unresulted Labs (From admission, onward)    None         Home Health: Yes Equipment/Devices: Rolling walker, shower stool  Discharge Condition: Stable CODE STATUS: Full code Diet recommendation: Cardiac  Subjective: Seen and examined.  No complaints.  Minimal suprapubic abdominal pain but no nausea or vomiting.  Tolerating diet.  Ready to go home today.  Brief/Interim Summary: Chelsea Mathis is a 30 y.o. female with medical history significant of DM2. 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  Admitted to our service from 8/1-8/12 followed by stay in rehab from 8/12-8/30. Pt presented with severe sepsis as well as DKA that admission. Multiple blood cultures positive for MRSA, pt put on IV vancomycin which she continues at home through PICC line. Due to myositis she also developed severe hip / pelvis pain which has been treated with Percocet PRN + Oxycontin scheduled. She has been taking the Oxycontin since discharge though she has held the percocet due to  running low on this medication.   This time patient presented with intractable N/V. Upon arrival to ED, she was hemodynamically stable with WBC 12k.   CT A/P: 1) new since 8/1 R SI joint septic arthritis. 2) small Thin crescentic fluid collection in the left hip periarticular musculature measuring 3.9 x 5 mm, suspicious for abscess. Small amount of ill-defined fluid in the right hip periarticular musculature. 3) ? L pyelonephritis   Lactate 1.9. Given 2L LR bolus. Given Rocephin for possible superimposed pyelonephritis.Vanc continued for MRSA bacteremia, endocarditis, myositis.  Patient was admitted under hospitalist service and see below for detailed hospitalization.  Intractable nausea and vomiting/pyelonephritis left: Resolved.  No further nausea and vomiting and she is tolerating regular diet.  She believes that her nausea and vomiting was secondary to pyelonephritis and believes that since we have added Rocephin she is feeling better.  Per ID, continue Rocephin for total of 7 more days at home.  Last day of Rocephin will be 12/12/2020.   MRSA bacteremia, endocarditis and myositis: Continue vancomycin, ID on board. OPAT orders signed.  Last day of vancomycin will be 12/31/2020.   Sepsis secondary to septic arthritis of Right SI joint/left hip septic arthritis and possible abscess, POA: IR was consulted and per them, patient does not have enough fluid at the left hip joint to be drained.  Ortho was consulted per ID recommendations.  Ortho does not recommend any surgical intervention.  Consult was placed for IR to aspirate SI joint.  But again per IR, there  is not enough fluid to drain.  ID was called directly.  ID recommends continuing antibiotics.   Hypokalemia: Low again.  Patient will receive potassium replacement today before discharge.   Type 2 diabetes mellitus: Patient did not meet any criteria of DKA.  Recent hemoglobin A1c on 10/31/2020 was 12.3.  She was discharged on 24 units of  Lantus from rehab.  She is only on SSI here and Lantus 15 units.  Blood sugar controlled.  Resume home medications.  Discharge Diagnoses:  Principal Problem:   Intractable nausea and vomiting Active Problems:   Bacteremia due to Staphylococcus aureus   DM2 (diabetes mellitus, type 2) (HCC)   Sepsis ([place])   Myositis   Acute bacterial endocarditis   Septic arthritis of sacroiliac joint ([place])   Chronic pain syndrome    Discharge Instructions  Discharge Instructions     [facility] pharmacist to adjust dose for Vancomycin, Aminoglycosides and other anti-infective therapies as requested by physician.   Complete by: As directed    [facility] to provide Cath Flo 72m   Complete by: As directed    Administer for PICC line occlusion and as ordered by physician for other access device issues.   Anaphylaxis Kit: Provided to treat any anaphylactic reaction to the medication being provided to the patient if First Dose or when requested by physician   Complete by: As directed    Epinephrine 157mml vial / amp: Administer 0.71m15m0.71ml74mubcutaneously once for moderate to severe anaphylaxis, nurse to call physician and pharmacy when reaction occurs and call 911 if needed for immediate care   Diphenhydramine 50mg61mIV vial: Administer 25-50mg 26mM PRN for first dose reaction, rash, itching, mild reaction, nurse to call physician and pharmacy when reaction occurs   Sodium Chloride 0.9% NS 500ml I7mdminister if needed for hypovolemic blood pressure drop or as ordered by physician after call to physician with anaphylactic reaction   Change dressing on IV access line weekly and PRN   Complete by: As directed    Flush IV access with Sodium Chloride 0.9% and Heparin 10 units/ml or 100 units/ml   Complete by: As directed    Home infusion instructions - [facility]   Complete by: As directed    Instructions: Flush IV access with Sodium Chloride 0.9% and Heparin  10units/ml or 100units/ml   Change dressing on IV access line: Weekly and PRN   Instructions Cath Flo 2mg: Ad45mister for PICC Line occlusion and as ordered by physician for other access device   [facility] pharmacist to adjust dose for: Vancomycin, Aminoglycosides and other anti-infective therapies as requested by physician   Method of administration may be changed at the discretion of home infusion pharmacist based upon assessment of the patient and/or caregiver's ability to self-administer the medication ordered   Complete by: As directed       Allergies as of 12/08/2020   No Known Allergies      Medication List     STOP taking these medications    polyethylene glycol 17 g packet Commonly known as: MIRALAX / GLYCOLAX       TAKE these medications    acetaminophen 325 MG tablet Commonly known as: TYLENOL Take 2 tablets (650 mg total) by mouth every 6 (six) hours as needed for mild pain, fever or headache.   cefTRIAXone  IVPB Commonly known as: ROCEPHIN Inject 2 g into the vein daily for 5 days. Indication:  ? Pyelo First Dose: Yes Last  Day of Therapy:  12/12/20 Labs - Once weekly:  CBC/D and BMP, Labs - Every other week:  ESR and CRP Method of administration: IV Push Method of administration may be changed at the discretion of home infusion pharmacist based upon assessment of the patient and/or caregiver's ability to self-administer the medication ordered.   diclofenac Sodium 1 % Gel Commonly known as: VOLTAREN Apply 2 g topically 4 (four) times daily.   gabapentin 300 MG capsule Commonly known as: NEURONTIN Take 1 capsule (300 mg total) by mouth 3 (three) times daily.   hydrOXYzine 25 MG tablet Commonly known as: ATARAX/VISTARIL Take 1 tablet (25 mg total) by mouth 2 (two) times daily as needed for anxiety.   methocarbamol 750 MG tablet Commonly known as: ROBAXIN Take 1 tablet (750 mg total) by mouth every 6 (six) hours as needed for muscle spasms (if  zanaflex doesn't work).   multivitamin with minerals Tabs tablet Take 1 tablet by mouth daily.   ondansetron 4 MG disintegrating tablet Commonly known as: Zofran ODT Take 1 tablet (4 mg total) by mouth every 8 (eight) hours as needed for nausea or vomiting.   oxyCODONE-acetaminophen 5-325 MG tablet Commonly known as: PERCOCET/ROXICET Take 1-2 tablets by mouth every 4 (four) hours as needed for severe pain or moderate pain.   OxyCONTIN 10 mg 12 hr tablet Generic drug: oxyCODONE Take 1 tablet (10 mg total) by mouth every 12 (twelve) hours.   propranolol 10 MG tablet Commonly known as: INDERAL Take 1 tablet (10 mg total) by mouth daily.   senna-docusate 8.6-50 MG tablet Commonly known as: Senokot-S Take 1 tablet by mouth 2 (two) times daily.   tiZANidine 2 MG tablet Commonly known as: ZANAFLEX Take 1 tablet (2 mg total) by mouth 3 (three) times daily.   vancomycin  IVPB Inject 1,750 mg into the vein every 12 (twelve) hours for 24 days. Indication:  MRSA endocarditis- new septic arthritis First Dose: Yes Last Day of Therapy:  12/31/20 Labs - "Sunday/Monday:  CBC/D, BMP, and vancomycin trough. Labs - Thursday:  BMP and vancomycin trough Labs - Every other week:  ESR and CRP Method of administration:Elastomeric Method of administration may be changed at the discretion of the patient and/or caregiver's ability to self-administer the medication ordered. What changed: additional instructions   Vitamin B6 50 MG Tabs Take 1 tablet (50 mg total) by mouth daily.               Durable Medical Equipment  (From admission, onward)           Start     Ordered   12/07/20 1057  For home use only DME Walker rolling  Once       Question Answer Comment  Walker: With 5 Inch Wheels   Patient needs a walker to treat with the following condition Pain      12/07/20 1057   12/07/20 1057  For home use only DME Shower stool  Once        12/07/20 1057              Discharge  Care Instructions  (From admission, onward)           Start     Ordered   12/07/20 0000  Change dressing on IV access line weekly and PRN  (Home infusion instructions - [facility] )        09" /07/22 1347            No Known  Allergies  Consultations: Orthopedics, IR and ID   Procedures/Studies: CT ABDOMEN PELVIS W CONTRAST  Result Date: 12/03/2020 CLINICAL DATA:  Abdominal abscess/infection suspected Nausea/vomiting EXAM: CT ABDOMEN AND PELVIS WITH CONTRAST TECHNIQUE: Multidetector CT imaging of the abdomen and pelvis was performed using the standard protocol following bolus administration of intravenous contrast. CONTRAST:  90m OMNIPAQUE IOHEXOL 350 MG/ML SOLN COMPARISON:  Abdominopelvic CT 10/31/2020 FINDINGS: Lower chest: Small to moderate-sized right pleural effusion is new from prior exam. There is pleural thickening in the lower right hemithorax with mild enhancement. Previous right basilar nodules are partially obscured by pleural fluid, the cavitary nodules are not definitively seen. Hepatobiliary: No focal hepatic abnormality. No calcified gallstone. No pericholecystic fat stranding. No biliary dilatation. Pancreas: No ductal dilatation or inflammation. Spleen: Nonspecific 6 mm low-density in the inferior spleen. This is retrospectively seen on prior exam. Spleen is normal in size. Adrenals/Urinary Tract: Normal adrenal glands. Heterogeneous area of decreased perfusion involving the posterior upper pole the left kidney, series 3, image 27. No hydronephrosis. No visualized renal calculi. Partially distended urinary bladder. Equivocal bladder wall thickening about the dome. Stomach/Bowel: The stomach is decompressed. There is no small bowel obstruction or inflammatory change. Appendix is not visualized, provided history of appendectomy. Majority of the colon is nondistended. There is equivocal mild pericolonic edema, for example in the transverse colon series 3, image 39.  Vascular/Lymphatic: Normal caliber abdominal aorta. Patent portal vein. No portal venous or mesenteric gas. There prominent left inguinal lymph nodes, largest measuring 9 mm short axis. Reproductive: IUD in the pelvis short IUD in the uterus. No adnexal mass. Other: No free air. Small amount of free fluid in the pelvis may be physiologic or reactive. There is mild generalized body wall edema. Musculoskeletal: There is a thin crescentic fluid collection in the left hip periarticular musculature, series 3, image 78. This measures approximately 3.9 x 5 mm, series 3, image 81. There is mild stranding about the included right anterior thigh musculature with fatty atrophy. Low-density fluid in the right hip periarticular musculature, series 3, image 80, patient has history of myositis. Serpiginous lucencies and erosive changes involving the right sacroiliac joint that are new from prior exam. No convincing iliopsoas collection. IMPRESSION: 1. Heterogeneous area of decreased perfusion involving the posterior upper pole the left kidney, suspicious for pyelonephritis. 2. Thin crescentic fluid collection in the left hip periarticular musculature measuring 3.9 x 5 mm, suspicious for abscess. Small amount of ill-defined fluid in the right hip periarticular musculature. 3. Serpiginous lucencies and erosive changes involving the right sacroiliac joint that are new from prior exam, suspicious for septic arthritis. 4. Small to moderate-sized right pleural effusion is new from prior exam. There is pleural thickening in the lower right hemithorax with mild enhancement. Previous right basilar nodules, some of which are cavitary, are obscured by pleural effusion on the current exam. 5. Equivocal mild bladder wall thickening about the dome. 6. Prominent left inguinal lymph nodes are likely reactive. Electronically Signed   By: MKeith RakeM.D.   On: 12/03/2020 19:37   DG Femur Min 2 Views Right  Result Date: 12/03/2020 CLINICAL  DATA:  Myositis. EXAM: RIGHT FEMUR 2 VIEWS COMPARISON:  None. FINDINGS: There is no evidence of fracture or other focal bone lesions. Soft tissues are unremarkable. No gas is present within the soft tissues. IMPRESSION: Negative. Electronically Signed   By: CSan MorelleM.D.   On: 12/03/2020 17:11   VAS [place]ER EXTREMITY VENOUS DUPLEX  Result Date: 11/09/2020 UPPER  VENOUS STUDY  Patient Name:  LATORSHA CURLING  Date of Exam:   11/09/2020 Medical Rec #: [id number]       Accession #:    [id number] Date of Birth: 10/28/1990        Patient Gender: F Patient Age:   18 years Exam Location[facility]  [facility] Procedure:      VAS [place] UPPER EXTREMITY VENOUS DUPLEX Referring Phys: Corrie Mckusick POKHREL --------------------------------------------------------------------------------  Indications: Pain, and Swelling Comparison Study: Previous exam 11/06/20 - negative Performing Technologist: Rogelia Rohrer RVT, RDMS  Examination Guidelines: A complete evaluation includes B-mode imaging, spectral Doppler, color Doppler, and power Doppler as needed of all accessible portions of each vessel. Bilateral testing is considered an integral part of a complete examination. Limited examinations for reoccurring indications may be performed as noted.  Right Findings: +----------+------------+---------+-----------+----------+-------+ RIGHT     CompressiblePhasicitySpontaneousPropertiesSummary +----------+------------+---------+-----------+----------+-------+ Subclavian    Full       Yes       Yes                      +----------+------------+---------+-----------+----------+-------+  Left Findings: +----------+------------+---------+-----------+----------+--------------------+ LEFT      CompressiblePhasicitySpontaneousProperties      Summary        +----------+------------+---------+-----------+----------+--------------------+ IJV           Full       Yes       Yes                                    +----------+------------+---------+-----------+----------+--------------------+ Subclavian    Full       Yes       Yes                                   +----------+------------+---------+-----------+----------+--------------------+ Axillary      Full       Yes       Yes                                   +----------+------------+---------+-----------+----------+--------------------+ Brachial      Full       Yes       Yes                                   +----------+------------+---------+-----------+----------+--------------------+ Radial        Full                                                       +----------+------------+---------+-----------+----------+--------------------+ Ulnar         Full                                                       +----------+------------+---------+-----------+----------+--------------------+ Cephalic      None       No        No  Acute - prox-mid                                                             forearm        +----------+------------+---------+-----------+----------+--------------------+ Basilic       Full       Yes       Yes                                   +----------+------------+---------+-----------+----------+--------------------+  Summary:  Right: No evidence of thrombosis in the subclavian.  Left: No evidence of deep vein thrombosis in the upper extremity. Findings consistent with acute superficial vein thrombosis involving the left cephalic vein.  *See table(s) above for measurements and observations.  Diagnosing physician: Harold Barban MD Electronically signed by Harold Barban MD on 11/09/2020 at 55:54:18 PM.    Final    [place] EKG SITE RITE  Result Date: 11/18/2020 If Site Rite image not attached, placement could not be confirmed due to current cardiac rhythm.  US Abdomen Limited RUQ (LIVER/GB)  Result Date: 12/05/2020 CLINICAL DATA:  Abdominal pain. EXAM: ULTRASOUND ABDOMEN  LIMITED RIGHT UPPER QUADRANT COMPARISON:  CT 12/03/2020 FINDINGS: Gallbladder: Physiologically distended with layering sludge. No gallstones or wall thickening visualized. No sonographic Murphy sign noted by sonographer. Common bile duct: Diameter: 4 mm, normal. Liver: Mildly heterogeneous in parenchymal echogenicity but no discrete focal lesion. No capsular nodularity. Portal vein is patent on color Doppler imaging with normal direction of blood flow towards the liver. Other: No right upper quadrant ascites IMPRESSION: 1. Gallbladder sludge without gallstones or acute cholecystitis no biliary dilatation. 2. Mildly heterogeneous liver parenchyma without focal abnormality. Electronically Signed   By: Keith Rake M.D.   On: 12/05/2020 18:37     Discharge Exam: Vitals:   12/08/20 0550 12/08/20 0809  BP: 110/68 109/67  Pulse: 85 75  Resp: 16 15  Temp: 98.3 F (36.8 C) 97.7 F (36.5 C)  SpO2: 100% 97%   Vitals:   12/07/20 1612 12/07/20 2231 12/08/20 0550 12/08/20 0809  BP: 122/78 (!) 111/55 110/68 109/67  Pulse: 88 88 85 75  Resp: '15 18 16 15  ' Temp: 98.1 F (36.7 C) 98.5 F (36.9 C) 98.3 F (36.8 C) 97.7 F (36.5 C)  TempSrc: Oral Oral Oral Oral  SpO2: 99% 98% 100% 97%    General: Pt is alert, awake, not in acute distress Cardiovascular: RRR, S1/S2 +, no rubs, no gallops Respiratory: CTA bilaterally, no wheezing, no rhonchi Abdominal: Soft, NT, ND, bowel sounds + Extremities: no edema, no cyanosis    The results of significant diagnostics from this hospitalization (including imaging, microbiology, ancillary and laboratory) are listed below for reference.     Microbiology: Recent Results (from the past 240 hour(s))  Blood Culture (routine x 2)     Status: None   Collection Time: 12/03/20 12:06 PM   Specimen: BLOOD  Result Value Ref Range Status   Specimen Description BLOOD BLOOD RIGHT HAND  Final   Special Requests   Final    BOTTLES DRAWN AEROBIC AND ANAEROBIC Blood  Culture results may not be optimal due to an inadequate volume of blood received in culture bottles   Culture   Final  NO GROWTH 5 DAYS Performed at [facility], [place] [address] [place], [place] [address]    Report Status 12/08/2020 FINAL  Final  Blood Culture (routine x 2)     Status: None   Collection Time: 12/03/20 12:11 PM   Specimen: BLOOD  Result Value Ref Range Status   Specimen Description BLOOD RIGHT ANTECUBITAL  Final   Special Requests   Final    BOTTLES DRAWN AEROBIC AND ANAEROBIC Blood Culture adequate volume   Culture   Final    NO GROWTH 5 DAYS Performed at [facility], [place] [address], [place], [place] [address]    Report Status 12/08/2020 FINAL  Final  Urine Culture     Status: Abnormal   Collection Time: 12/03/20  8:54 PM   Specimen: Urine, Clean Catch  Result Value Ref Range Status   Specimen Description URINE, CLEAN CATCH  Final   Special Requests   Final    NONE Performed at [facility], [place] [address], [place], [place] [address]    Culture MULTIPLE SPECIES PRESENT, SUGGEST RECOLLECTION (A)  Final   Report Status 12/05/2020 FINAL  Final  SARS CORONAVIRUS 2 (TAT 6-24 HRS) Nasopharyngeal Nasopharyngeal Swab     Status: None   Collection Time: 12/03/20  9:47 PM   Specimen: Nasopharyngeal Swab  Result Value Ref Range Status   SARS Coronavirus 2 NEGATIVE NEGATIVE Final    Comment: (NOTE) SARS-CoV-2 target nucleic acids are NOT DETECTED.  The SARS-CoV-2 RNA is generally detectable in upper and lower respiratory specimens during the acute phase of infection. Negative results do not preclude SARS-CoV-2 infection, do not rule out co-infections with other pathogens, and should not be used as the sole basis for treatment or other patient management decisions. Negative results must be combined with clinical observations, patient history, and epidemiological information. The expected result is Negative.  Fact Sheet for  Patients: SugarRoll.be  Fact Sheet for Healthcare Providers: https://www.woods-mathews.com/  This test is not yet approved or cleared by the [place] FDA and  has been authorized for detection and/or diagnosis of SARS-CoV-2 by FDA under an Emergency Use Authorization (EUA). This EUA will remain  in effect (meaning this test can be used) for the duration of the COVID-19 declaration under Se ction 564(b)(1) of the Act, 21 U.S.C. section 360bbb-3(b)(1), unless the authorization is terminated or revoked sooner.  Performed at [facility], [place] [address], [place], [place] [address]   MRSA Next Gen by PCR, Nasal     Status: None   Collection Time: 12/04/20  7:47 AM   Specimen: Nasal Mucosa; Nasal Swab  Result Value Ref Range Status   MRSA by PCR Next Gen NOT DETECTED NOT DETECTED Final    Comment: (NOTE) The GeneXpert MRSA Assay (FDA approved for NASAL specimens only), is one component of a comprehensive MRSA colonization surveillance program. It is not intended to diagnose MRSA infection nor to guide or monitor treatment for MRSA infections. Test performance is not FDA approved in patients less than 60 years old. Performed at [facility], [place] [address], [place],  [address]      Labs: BNP (last 3 results) No results for input(s): BNP in the last 8760 hours. Basic Metabolic Panel: Recent Labs  Lab 12/04/20 0554 12/05/20 0345 12/06/20 0318 12/07/20 1240 12/08/20 0407  NA 133* 136 136 135 135  K 3.0* 2.8* 3.0* 3.3* 3.2*  CL 100 99 102 101 100  CO2 21* 27  '27 27 26  ' GLUCOSE 155* 110* 77 111* 111*  BUN 3* <5* <5* 5* <5*  CREATININE 0.40* 0.32* 0.31* <0.30* 0.30*  CALCIUM 8.8* 8.8* 8.6* 8.5* 8.7*  MG  --   --  1.5*  --   --    Liver Function Tests: Recent Labs  Lab 12/03/20 1206 12/04/20 0554 12/06/20 0951  AST '16 15 17  ' ALT '11 8 8  ' ALKPHOS 110 82 75  BILITOT 1.1 1.0 0.5  PROT 9.2* 7.7 7.2  ALBUMIN  2.7* 2.3* 2.3*   Recent Labs  Lab 12/06/20 0951  LIPASE 45   No results for input(s): AMMONIA in the last 168 hours. CBC: Recent Labs  Lab 12/03/20 1206 12/03/20 1344 12/04/20 0554 12/05/20 0345 12/06/20 0318  WBC 12.0*  --  13.3* 10.9* 9.4  NEUTROABS 10.0*  --   --  7.0 5.3  HGB 10.6* 12.6 8.7* 8.7* 8.9*  HCT 34.9* 37.0 27.9* 28.5* 29.1*  MCV 83.9  --  81.6 82.4 82.4  PLT 508*  --  536* 525* 521*   Cardiac Enzymes: Recent Labs  Lab 12/03/20 1206  CKTOTAL 25*   BNP: Invalid input(s): POCBNP CBG: Recent Labs  Lab 12/07/20 2126 12/08/20 0144 12/08/20 0446 12/08/20 0807 12/08/20 1231  GLUCAP 113* 130* 97 89 108*   D-Dimer No results for input(s): DDIMER in the last 72 hours. Hgb A1c No results for input(s): HGBA1C in the last 72 hours. Lipid Profile No results for input(s): CHOL, HDL, LDLCALC, TRIG, CHOLHDL, LDLDIRECT in the last 72 hours. Thyroid function studies No results for input(s): TSH, T4TOTAL, T3FREE, THYROIDAB in the last 72 hours.  Invalid input(s): FREET3 Anemia work up No results for input(s): VITAMINB12, FOLATE, FERRITIN, TIBC, IRON, RETICCTPCT in the last 72 hours. Urinalysis    Component Value Date/Time   COLORURINE STRAW (A) 12/05/2020 1800   APPEARANCEUR CLEAR 12/05/2020 1800   LABSPEC 1.011 12/05/2020 1800   PHURINE 7.0 12/05/2020 1800   GLUCOSEU NEGATIVE 12/05/2020 1800   HGBUR NEGATIVE 12/05/2020 1800   BILIRUBINUR NEGATIVE 12/05/2020 1800   KETONESUR 20 (A) 12/05/2020 1800   PROTEINUR NEGATIVE 12/05/2020 1800   NITRITE NEGATIVE 12/05/2020 1800   LEUKOCYTESUR NEGATIVE 12/05/2020 1800   Sepsis Labs Invalid input(s): PROCALCITONIN,  WBC,  LACTICIDVEN Microbiology Recent Results (from the past 240 hour(s))  Blood Culture (routine x 2)     Status: None   Collection Time: 12/03/20 12:06 PM   Specimen: BLOOD  Result Value Ref Range Status   Specimen Description BLOOD BLOOD RIGHT HAND  Final   Special Requests   Final    BOTTLES  DRAWN AEROBIC AND ANAEROBIC Blood Culture results may not be optimal due to an inadequate volume of blood received in culture bottles   Culture   Final    NO GROWTH 5 DAYS Performed at [facility], [place] [address], [place], [place] [address]    Report Status 12/08/2020 FINAL  Final  Blood Culture (routine x 2)     Status: None   Collection Time: 12/03/20 12:11 PM   Specimen: BLOOD  Result Value Ref Range Status   Specimen Description BLOOD RIGHT ANTECUBITAL  Final   Special Requests   Final    BOTTLES DRAWN AEROBIC AND ANAEROBIC Blood Culture adequate volume   Culture   Final    NO GROWTH 5 DAYS Performed at [facility], [place] [address], [place], [place] [address]    Report Status 12/08/2020 FINAL  Final  Urine Culture  Status: Abnormal   Collection Time: 12/03/20  8:54 PM   Specimen: Urine, Clean Catch  Result Value Ref Range Status   Specimen Description URINE, CLEAN CATCH  Final   Special Requests   Final    NONE Performed at [facility], [place] [address] [place], [place] [address]    Culture MULTIPLE SPECIES PRESENT, SUGGEST RECOLLECTION (A)  Final   Report Status 12/05/2020 FINAL  Final  SARS CORONAVIRUS 2 (TAT 6-24 HRS) Nasopharyngeal Nasopharyngeal Swab     Status: None   Collection Time: 12/03/20  9:47 PM   Specimen: Nasopharyngeal Swab  Result Value Ref Range Status   SARS Coronavirus 2 NEGATIVE NEGATIVE Final    Comment: (NOTE) SARS-CoV-2 target nucleic acids are NOT DETECTED.  The SARS-CoV-2 RNA is generally detectable in upper and lower respiratory specimens during the acute phase of infection. Negative results do not preclude SARS-CoV-2 infection, do not rule out co-infections with other pathogens, and should not be used as the sole basis for treatment or other patient management decisions. Negative results must be combined with clinical observations, patient history, and epidemiological information. The expected result is  Negative.  Fact Sheet for Patients: SugarRoll.be  Fact Sheet for Healthcare Providers: https://www.woods-mathews.com/  This test is not yet approved or cleared by the [place] FDA and  has been authorized for detection and/or diagnosis of SARS-CoV-2 by FDA under an Emergency Use Authorization (EUA). This EUA will remain  in effect (meaning this test can be used) for the duration of the COVID-19 declaration under Se ction 564(b)(1) of the Act, 21 U.S.C. section 360bbb-3(b)(1), unless the authorization is terminated or revoked sooner.  Performed at [facility], [place] [address], [place], [place] [address]   MRSA Next Gen by PCR, Nasal     Status: None   Collection Time: 12/04/20  7:47 AM   Specimen: Nasal Mucosa; Nasal Swab  Result Value Ref Range Status   MRSA by PCR Next Gen NOT DETECTED NOT DETECTED Final    Comment: (NOTE) The GeneXpert MRSA Assay (FDA approved for NASAL specimens only), is one component of a comprehensive MRSA colonization surveillance program. It is not intended to diagnose MRSA infection nor to guide or monitor treatment for MRSA infections. Test performance is not FDA approved in patients less than 79 years old. Performed at [facility], [place] [address] [place], [place] [address]      Time coordinating discharge: Over 30 minutes  SIGNED:   Darliss Cheney, MD  [facility] 12/08/2020, 1:02 PM  If 7PM-7AM, please contact night-coverage [email or web address]

## 2020-12-09 DIAGNOSIS — I330 Acute and subacute infective endocarditis: Secondary | ICD-10-CM | POA: Diagnosis not present

## 2020-12-09 DIAGNOSIS — R7881 Bacteremia: Secondary | ICD-10-CM | POA: Diagnosis not present

## 2020-12-09 DIAGNOSIS — G894 Chronic pain syndrome: Secondary | ICD-10-CM | POA: Diagnosis not present

## 2020-12-09 DIAGNOSIS — R112 Nausea with vomiting, unspecified: Secondary | ICD-10-CM | POA: Diagnosis not present

## 2020-12-09 LAB — GLUCOSE, CAPILLARY
Glucose-Capillary: 108 mg/dL — ABNORMAL HIGH (ref 70–99)
Glucose-Capillary: 118 mg/dL — ABNORMAL HIGH (ref 70–99)
Glucose-Capillary: 73 mg/dL (ref 70–99)
Glucose-Capillary: 90 mg/dL (ref 70–99)
Glucose-Capillary: 94 mg/dL (ref 70–99)
Glucose-Capillary: 96 mg/dL (ref 70–99)
Glucose-Capillary: 97 mg/dL (ref 70–99)

## 2020-12-09 MED ORDER — OXYCODONE HCL 5 MG PO TABS
5.0000 mg | ORAL_TABLET | ORAL | Status: DC | PRN
  Administered 2020-12-09 – 2020-12-11 (×4): 5 mg via ORAL
  Filled 2020-12-09 (×4): qty 1

## 2020-12-09 MED ORDER — HYDROMORPHONE HCL 1 MG/ML IJ SOLN
0.5000 mg | Freq: Four times a day (QID) | INTRAMUSCULAR | Status: DC | PRN
  Administered 2020-12-09 – 2020-12-11 (×6): 0.5 mg via INTRAVENOUS
  Filled 2020-12-09 (×6): qty 0.5

## 2020-12-09 NOTE — TOC Progression Note (Addendum)
Transition of Care [facility]) - Progression Note    Patient Details  Name: Chelsea Mathis MRN: [id number] Date of Birth: 06-15-1990  Transition of Care [facility]) CM/SW Contact  Leone Haven, RN Phone Number: 12/09/2020, 9:26 AM  Clinical Narrative:    NCM notified [facility], and [facility] that patient is for dc today, per [facility] the medication was delivered to paitent's home yesterday.  Patient states she would like the shower stool to be delivered to her home.  NCM informed Shelia with [employer].    NCM received vm from [facility] with [employer] regarding the shower stool stating they gave patient one in August so they are unable to supply this for her.  NCM informed patient of this information, she states the somone name Becky with [employer] is suppose to be checking on this for her.  NCM informed her they are gone for the day and she will have to have somone to check on the showerstool tomorrow.   Expected Discharge Plan: Home w Home Health Services Barriers to Discharge: No Barriers Identified  Expected Discharge Plan and Services Expected Discharge Plan: Home w Home Health Services   Discharge Planning Services: CM Consult   Living arrangements for the past 2 months: Single Family Home Expected Discharge Date: 12/08/20               DME Arranged: Other see comment DME Agency: Other - Comment ([facility]) Date DME Agency Contacted: 12/06/20 Time DME Agency Contacted: 1628   HH Arranged: RN, PT, OT HH Agency: [facility] Date HH Agency Contacted: 12/06/20 Time HH Agency Contacted: 1629 Representative spoke with at [facility] Agency: Pearson Grippe   Social Determinants of Health (SDOH) Interventions    Readmission Risk Interventions No flowsheet data found.

## 2020-12-09 NOTE — Progress Notes (Addendum)
PROGRESS NOTE    Chelsea Mathis  YQM:[id number] DOB: [birth year] DOA: 12/03/2020 PCP: Pcp, No   Brief Narrative:  Chelsea Mathis is a 30 y.o. female with medical history significant of DM2. 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  Admitted to our service from 8/1-8/12 followed by stay in rehab from 8/12-8/30. Pt presented with severe sepsis as well as DKA that admission. Multiple blood cultures positive for MRSA, pt put on IV vancomycin which she continues at home through PICC line. Due to myositis she also developed severe hip / pelvis pain which has been treated with Percocet PRN + Oxycontin scheduled. She has been taking the Oxycontin since discharge though she has held the percocet due to running low on this medication.  This time patient presented with intractable N/V. Upon arrival to ED, she was hemodynamically stable with WBC 12k.   CT A/P: 1) new since 8/1 R SI joint septic arthritis. 2) small Thin crescentic fluid collection in the left hip periarticular musculature measuring 3.9 x 5 mm, suspicious for abscess. Small amount of ill-defined fluid in the right hip periarticular musculature. 3) ? L pyelonephritis  Lactate 1.9. Given 2L LR bolus. Given Rocephin for possible superimposed pyelonephritis.Vanc continued for MRSA bacteremia, endocarditis, myositis.  Assessment & Plan:   Principal Problem:   Intractable nausea and vomiting Active Problems:   Bacteremia due to Staphylococcus aureus   DM2 (diabetes mellitus, type 2) (HCC)   Sepsis (HCC)   Myositis   Acute bacterial endocarditis   Septic arthritis of sacroiliac joint (HCC)   Chronic pain syndrome  Intractable nausea and vomiting: Previously resolved.  However, today the patient says her nausea and vomiting is back.  She is continuing to require IV Dilaudid every 3 hours.  She is also complaining of nonspecific abdominal discomfort.  Will order abdominal ultrasound.  As needed nausea medication.  Also discussed  with the patient that we need to back off on the IV Dilaudid.  Will change IV Dilaudid to IV every 6 hours and I hope to wean her off the IV pain meds and place her on Oxy IR as needed. -If her symptoms are continuing to persist and ultrasound is unrevealing we may need to consider CT of the abdomen and pelvis to evaluate.  MRSA bacteremia, endocarditis and myositis: Continue vancomycin, ID on board. OPAT orders signed.  Sepsis secondary to septic arthritis of Right SI joint/left hip septic arthritis and possible abscess, POA: IR was consulted and per them, patient does not have enough fluid at the left hip joint to be drained.  Ortho was consulted per ID recommendations.  Ortho does not recommend any surgical intervention.  Consult was placed for IR to aspirate SI joint.  But again per IR, there is not enough fluid to drain.  ID was called directly.  Continue antibiotics per ID.  Possible left pyelonephritis: As discussed above.  Patient on Rocephin now.  Hypokalemia/hypomagnesemia: Replaced yesterday but no labs today.  Order BMP in AM.  Continue to monitor and replace as needed.  Type 2 diabetes mellitus: Patient did not meet any criteria of DKA.  Recent hemoglobin A1c on 10/31/2020 was 12.3.  She was discharged on 24 units of Lantus from rehab.  She is only on SSI here and Lantus 15 units.  Blood sugar controlled.  Continue this regimen.  DVT prophylaxis: enoxaparin (LOVENOX) injection 40 mg Start: 12/03/20 2330   Code Status: Full Code  Family Communication: No family at bedside, discussed with significant other who  the patient called on the phone at the time of examining the patient.    Status is: Inpatient  Remains inpatient appropriate because:Inpatient level of care appropriate due to severity of illness  Dispo: The patient is from: Home              Anticipated d/c is to: Home with home health              Patient currently is not medically stable to d/c.   Difficult to place patient  No   Estimated body mass index is 32.47 kg/m as calculated from the following:   Height as of 11/11/20: 5\' 4"  (1.626 m).   Weight as of 11/29/20: 85.8 kg.   Nutritional Assessment: There is no height or weight on file to calculate BMI.. Seen by dietician.  I agree with the assessment and plan as outlined below: Nutrition Status: Nutrition Problem: Inadequate oral intake Etiology: poor appetite, nausea, vomiting Skin Assessment: I have examined the patient's skin and I agree with the wound assessment as performed by the wound care RN as outlined below:    Consultants:  ID IR  Procedures:  None  Antimicrobials:  Anti-infectives (From admission, onward)    Start     Dose/Rate Route Frequency Ordered Stop   12/07/20 2100  vancomycin (VANCOREADY) IVPB 1750 mg/350 mL        1,750 mg 175 mL/hr over 120 Minutes Intravenous 2 times daily 12/07/20 1032     12/07/20 0000  vancomycin IVPB        1,750 mg Intravenous Every 12 hours 12/07/20 1054 12/31/20 2359   12/07/20 0000  cefTRIAXone (ROCEPHIN) IVPB        2 g Intravenous Every 24 hours 12/07/20 1347 12/12/20 2359   12/06/20 1030  cefTRIAXone (ROCEPHIN) 2 g in sodium chloride 0.9 % 100 mL IVPB        2 g 200 mL/hr over 30 Minutes Intravenous Every 24 hours 12/06/20 0919 12/13/20 0959   12/03/20 2100  vancomycin (VANCOREADY) IVPB 1750 mg/350 mL  Status:  Discontinued        1,750 mg 175 mL/hr over 120 Minutes Intravenous 2 times daily 12/03/20 2049 12/07/20 1032   12/03/20 2030  cefTRIAXone (ROCEPHIN) 2 g in sodium chloride 0.9 % 100 mL IVPB        2 g 200 mL/hr over 30 Minutes Intravenous  Once 12/03/20 2018 12/03/20 2110          Subjective: She is complaining of nausea and vomiting, complaining of epigastric discomfort. Denies diarrhea.  Objective: Vitals:   12/08/20 2057 12/09/20 0600 12/09/20 0800 12/09/20 1453  BP: (!) 151/98 (!) 148/66 123/69 131/71  Pulse: 75 88 88 72  Resp: 19 17 16 17   Temp: 98.1 F (36.7  C) 98 F (36.7 C) 98.3 F (36.8 C) 97.9 F (36.6 C)  TempSrc: Oral Oral Oral Oral  SpO2: 98%  97% 97%    Intake/Output Summary (Last 24 hours) at 12/09/2020 1748 Last data filed at 12/09/2020 0300 Gross per 24 hour  Intake 456.6 ml  Output --  Net 456.6 ml    There were no vitals filed for this visit.  Examination:  General exam: Appears calm and comfortable  Respiratory system: Clear to auscultation. Respiratory effort normal. Cardiovascular system: S1 & S2 heard, RRR. No JVD, murmurs, rubs, gallops or clicks. No pedal edema. Gastrointestinal system: Abdomen is nondistended, soft and minimal epigastric tenderness, no guarding or rebound. Normal bowel sounds heard. Central nervous  system: Alert and oriented. No focal neurological deficits. Extremities: Symmetric 5 x 5 power. Skin: No rashes, lesions or ulcers.  Psychiatry: Judgement and insight appear normal. Mood & affect appropriate.    Data Reviewed: I have personally reviewed following labs and imaging studies  CBC: Recent Labs  Lab 12/03/20 1206 12/03/20 1344 12/04/20 0554 12/05/20 0345 12/06/20 0318  WBC 12.0*  --  13.3* 10.9* 9.4  NEUTROABS 10.0*  --   --  7.0 5.3  HGB 10.6* 12.6 8.7* 8.7* 8.9*  HCT 34.9* 37.0 27.9* 28.5* 29.1*  MCV 83.9  --  81.6 82.4 82.4  PLT 508*  --  536* 525* 521*    Basic Metabolic Panel: Recent Labs  Lab 12/04/20 0554 12/05/20 0345 12/06/20 0318 12/07/20 1240 12/08/20 0407  NA 133* 136 136 135 135  K 3.0* 2.8* 3.0* 3.3* 3.2*  CL 100 99 102 101 100  CO2 21* 27 27 27 26   GLUCOSE 155* 110* 77 111* 111*  BUN 3* <5* <5* 5* <5*  CREATININE 0.40* 0.32* 0.31* <0.30* 0.30*  CALCIUM 8.8* 8.8* 8.6* 8.5* 8.7*  MG  --   --  1.5*  --   --     GFR: Estimated Creatinine Clearance: 106.9 mL/min (A) (by C-G formula based on SCr of 0.3 mg/dL (L)). Liver Function Tests: Recent Labs  Lab 12/03/20 1206 12/04/20 0554 12/06/20 0951  AST 16 15 17   ALT 11 8 8   ALKPHOS 110 82 75  BILITOT  1.1 1.0 0.5  PROT 9.2* 7.7 7.2  ALBUMIN 2.7* 2.3* 2.3*    Recent Labs  Lab 12/06/20 0951  LIPASE 45    No results for input(s): AMMONIA in the last 168 hours. Coagulation Profile: Recent Labs  Lab 12/03/20 1206  INR 1.3*    Cardiac Enzymes: Recent Labs  Lab 12/03/20 1206  CKTOTAL 25*    BNP (last 3 results) No results for input(s): PROBNP in the last 8760 hours. HbA1C: No results for input(s): HGBA1C in the last 72 hours. CBG: Recent Labs  Lab 12/09/20 0439 12/09/20 0642 12/09/20 0748 12/09/20 1137 12/09/20 1623  GLUCAP 96 90 97 108* 73    Lipid Profile: No results for input(s): CHOL, HDL, LDLCALC, TRIG, CHOLHDL, LDLDIRECT in the last 72 hours. Thyroid Function Tests: No results for input(s): TSH, T4TOTAL, FREET4, T3FREE, THYROIDAB in the last 72 hours. Anemia Panel: No results for input(s): VITAMINB12, FOLATE, FERRITIN, TIBC, IRON, RETICCTPCT in the last 72 hours. Sepsis Labs: Recent Labs  Lab 12/03/20 1334 12/03/20 2310  LATICACIDVEN 1.9 0.9     Recent Results (from the past 240 hour(s))  Blood Culture (routine x 2)     Status: None   Collection Time: 12/03/20 12:06 PM   Specimen: BLOOD  Result Value Ref Range Status   Specimen Description BLOOD BLOOD RIGHT HAND  Final   Special Requests   Final    BOTTLES DRAWN AEROBIC AND ANAEROBIC Blood Culture results may not be optimal due to an inadequate volume of blood received in culture bottles   Culture   Final    NO GROWTH 5 DAYS Performed at [facility], [address] [place], [facility] [address]    Report Status 12/08/2020 FINAL  Final  Blood Culture (routine x 2)     Status: None   Collection Time: 12/03/20 12:11 PM   Specimen: BLOOD  Result Value Ref Range Status   Specimen Description BLOOD RIGHT ANTECUBITAL  Final   Special Requests   Final  BOTTLES DRAWN AEROBIC AND ANAEROBIC Blood Culture adequate volume   Culture   Final    NO GROWTH 5 DAYS Performed at [facility], [address], [place], [place] [address]    Report Status 12/08/2020 FINAL  Final  Urine Culture     Status: Abnormal   Collection Time: 12/03/20  8:54 PM   Specimen: Urine, Clean Catch  Result Value Ref Range Status   Specimen Description URINE, CLEAN CATCH  Final   Special Requests   Final    NONE Performed at [facility], [address] [place], [place] [address]    Culture MULTIPLE SPECIES PRESENT, SUGGEST RECOLLECTION (A)  Final   Report Status 12/05/2020 FINAL  Final  SARS CORONAVIRUS 2 (TAT 6-24 HRS) Nasopharyngeal Nasopharyngeal Swab     Status: None   Collection Time: 12/03/20  9:47 PM   Specimen: Nasopharyngeal Swab  Result Value Ref Range Status   SARS Coronavirus 2 NEGATIVE NEGATIVE Final    Comment: (NOTE) SARS-CoV-2 target nucleic acids are NOT DETECTED.  The SARS-CoV-2 RNA is generally detectable in upper and lower respiratory specimens during the acute phase of infection. Negative results do not preclude SARS-CoV-2 infection, do not rule out co-infections with other pathogens, and should not be used as the sole basis for treatment or other patient management decisions. Negative results must be combined with clinical observations, patient history, and epidemiological information. The expected result is Negative.  Fact Sheet for Patients: HairSlick.no  Fact Sheet for Healthcare Providers: quierodirigir.com  This test is not yet approved or cleared by the [place] FDA and  has been authorized for detection and/or diagnosis of SARS-CoV-2 by FDA under an Emergency Use Authorization (EUA). This EUA will remain  in effect (meaning this test can be used) for the duration of the COVID-19 declaration under Se ction 564(b)(1) of the Act, 21 U.S.C. section 360bbb-3(b)(1), unless the authorization is terminated or revoked sooner.  Performed at [facility], [address], [place],  [place] [address]   MRSA Next Gen by PCR, Nasal     Status: None   Collection Time: 12/04/20  7:47 AM   Specimen: Nasal Mucosa; Nasal Swab  Result Value Ref Range Status   MRSA by PCR Next Gen NOT DETECTED NOT DETECTED Final    Comment: (NOTE) The GeneXpert MRSA Assay (FDA approved for NASAL specimens only), is one component of a comprehensive MRSA colonization surveillance program. It is not intended to diagnose MRSA infection nor to guide or monitor treatment for MRSA infections. Test performance is not FDA approved in patients less than 8 years old. Performed at [facility], [address], [place], [place] [address]        Radiology Studies: No results found.  Scheduled Meds:  Chlorhexidine Gluconate Cloth  6 each Topical Daily   diclofenac Sodium  2 g Topical QID   enoxaparin (LOVENOX) injection  40 mg Subcutaneous QHS   feeding supplement (GLUCERNA SHAKE)  237 mL Oral BID BM   gabapentin  300 mg Oral TID   insulin aspart  0-9 Units Subcutaneous Q4H   insulin glargine-yfgn  15 Units Subcutaneous Daily   multivitamin with minerals  1 tablet Oral Daily   pantoprazole (PROTONIX) IV  40 mg Intravenous Q24H   propranolol  10 mg Oral Daily   Ensure Max Protein  11 oz Oral QHS   pyridOXINE  50 mg Oral Daily   senna-docusate  1 tablet Oral BID  tiZANidine  2 mg Oral TID   Continuous Infusions:  cefTRIAXone (ROCEPHIN)  IV 2 g (12/09/20 16100923)   lactated ringers 10 mL/hr at 12/09/20 1639   promethazine (PHENERGAN) injection (IM or IVPB) 12.5 mg (12/09/20 1045)   vancomycin 1,750 mg (12/09/20 1121)     LOS: 5 days   Time spent: 35 minutes  Vonzella NippleAnupama Starkeisha Vanwinkle, MD [facility]  12/09/2020, 5:48 PM  Please page via Amion and do not message via secure chat for anything urgent. Secure chat can be used for anything non urgent and I will respond at my earliest availability.  How to contact the [facility] Attending or Consulting provider 7A - 7P or covering provider during after  hours 7P -7A, for this patient?  Check the care team in [facility] and look for a) attending/consulting TR[facility] provider listed and b) the [facility] team listed. Page or secure chat 7A-7P. Log into [email or web address] and use 's universal password to access. If you do not have the password, please contact the hospital operator. Locate the [facility] provider you are looking for under [facility] and page to a number that you can be directly reached. If you still have difficulty reaching the provider, please page the [facility] (Director on Call) for the Hospitalists listed on amion for assistance.

## 2020-12-10 ENCOUNTER — Inpatient Hospital Stay (HOSPITAL_COMMUNITY): Payer: Medicaid Other

## 2020-12-10 DIAGNOSIS — R112 Nausea with vomiting, unspecified: Secondary | ICD-10-CM | POA: Diagnosis not present

## 2020-12-10 LAB — GLUCOSE, CAPILLARY
Glucose-Capillary: 107 mg/dL — ABNORMAL HIGH (ref 70–99)
Glucose-Capillary: 124 mg/dL — ABNORMAL HIGH (ref 70–99)
Glucose-Capillary: 130 mg/dL — ABNORMAL HIGH (ref 70–99)
Glucose-Capillary: 93 mg/dL (ref 70–99)
Glucose-Capillary: 94 mg/dL (ref 70–99)
Glucose-Capillary: 99 mg/dL (ref 70–99)

## 2020-12-10 LAB — COMPREHENSIVE METABOLIC PANEL
ALT: 10 U/L (ref 0–44)
AST: 19 U/L (ref 15–41)
Albumin: 2.2 g/dL — ABNORMAL LOW (ref 3.5–5.0)
Alkaline Phosphatase: 79 U/L (ref 38–126)
Anion gap: 7 (ref 5–15)
BUN: <5 mg/dL — ABNORMAL LOW (ref 6–20)
CO2: 28 mmol/L (ref 22–32)
Calcium: 8.4 mg/dL — ABNORMAL LOW (ref 8.9–10.3)
Chloride: 102 mmol/L (ref 98–111)
Creatinine, Ser: 0.34 mg/dL — ABNORMAL LOW (ref 0.44–1.00)
GFR, Estimated: >60 mL/min (ref >60)
Glucose, Bld: 98 mg/dL (ref 70–99)
Potassium: 2.8 mmol/L — ABNORMAL LOW (ref 3.5–5.1)
Sodium: 137 mmol/L (ref 135–145)
Total Bilirubin: 0.6 mg/dL (ref 0.3–1.2)
Total Protein: 6.7 g/dL (ref 6.5–8.1)

## 2020-12-10 LAB — CBC
HCT: 28.6 % — ABNORMAL LOW (ref 36.0–46.0)
Hemoglobin: 8.6 g/dL — ABNORMAL LOW (ref 12.0–15.0)
MCH: 25.1 pg — ABNORMAL LOW (ref 26.0–34.0)
MCHC: 30.1 g/dL (ref 30.0–36.0)
MCV: 83.6 fL (ref 80.0–100.0)
Platelets: 502 10*3/uL — ABNORMAL HIGH (ref 150–400)
RBC: 3.42 MIL/uL — ABNORMAL LOW (ref 3.87–5.11)
RDW: 16.5 % — ABNORMAL HIGH (ref 11.5–15.5)
WBC: 7.9 10*3/uL (ref 4.0–10.5)
nRBC: 0 % (ref 0.0–0.2)

## 2020-12-10 LAB — MAGNESIUM: Magnesium: 1.6 mg/dL — ABNORMAL LOW (ref 1.7–2.4)

## 2020-12-10 IMAGING — US US ABDOMEN LIMITED
1 series · 14 of 25 positions shown · non-contrast
Comparison: [DATE]

CLINICAL DATA: Abdominal pain

EXAM:
ULTRASOUND ABDOMEN LIMITED RIGHT UPPER QUADRANT

[Series 1: us abdomen limited ruq (liver/gb) · 14 of 49 slices shown]
[im 1/49]
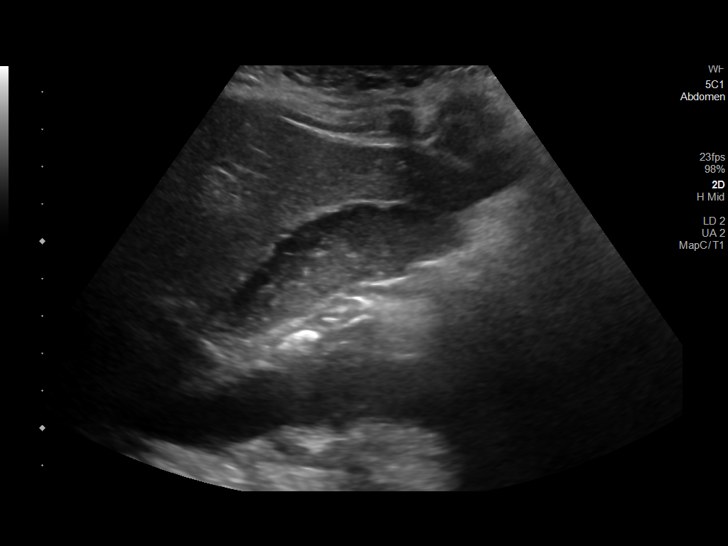
[im 5/49]
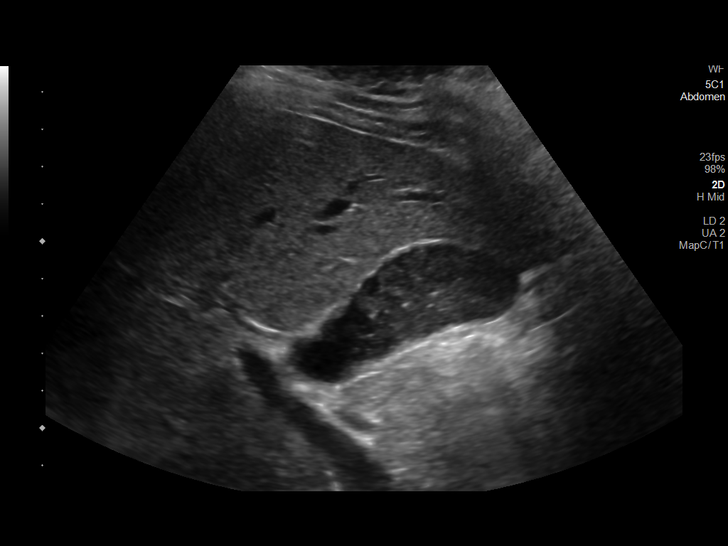
[im 9/49]
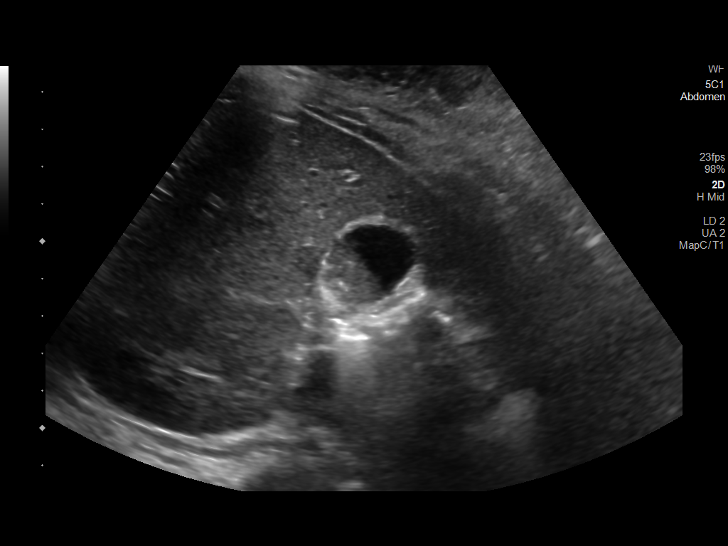
[im 13/49]
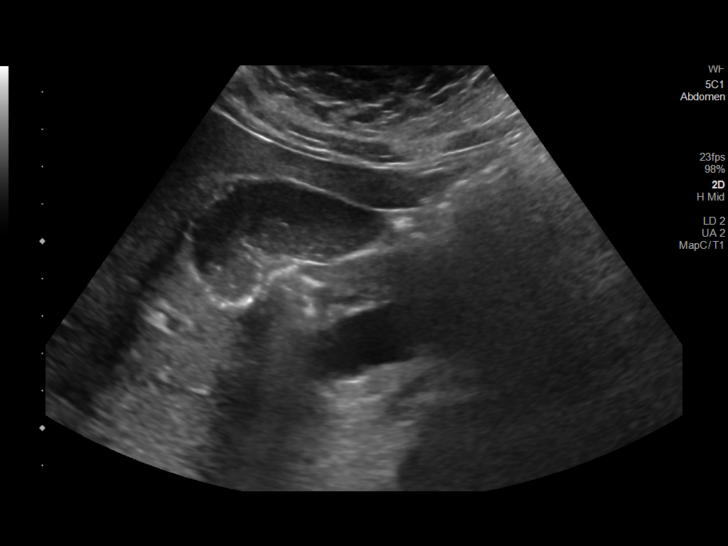
[im 17/49]
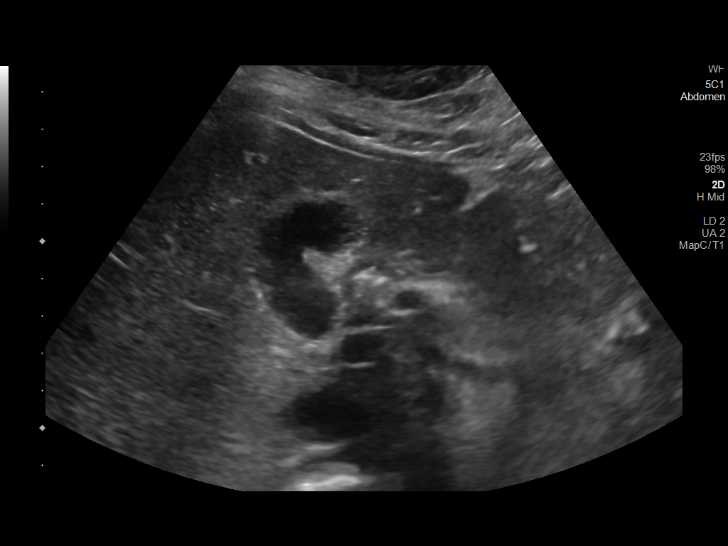
[im 19/49]
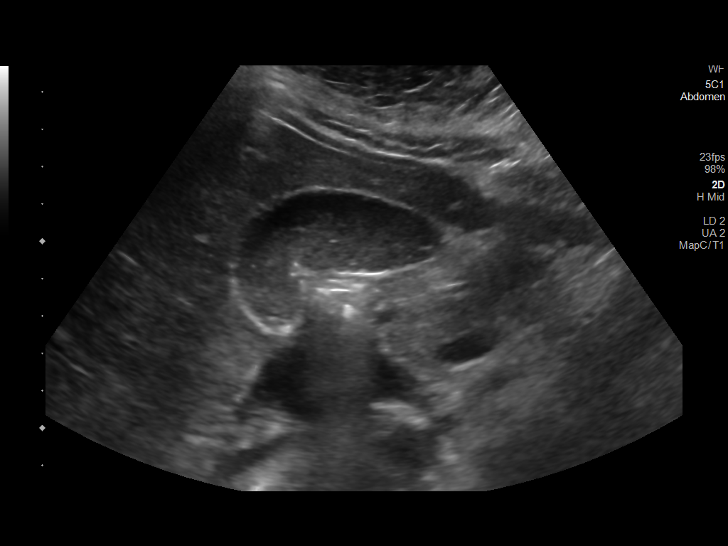
[im 23/49]
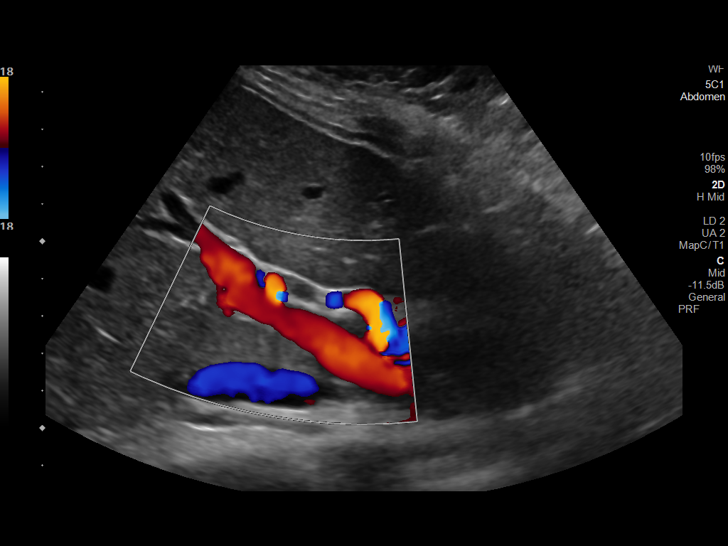
[im 27/49]
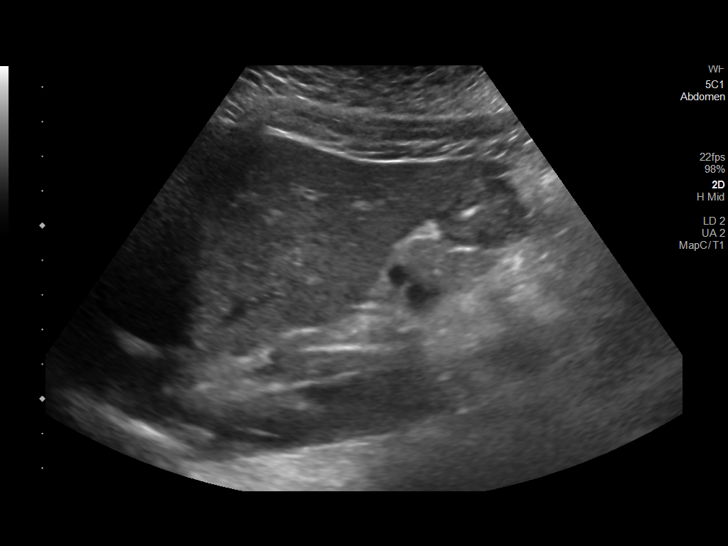
[im 31/49]
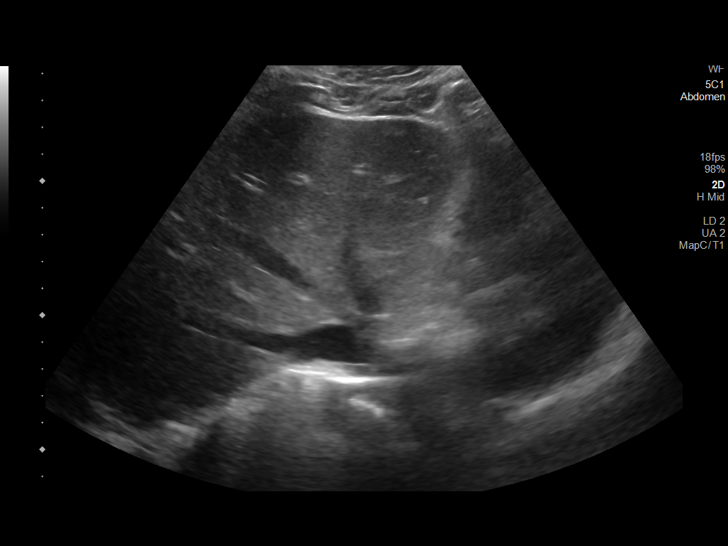
[im 33/49]
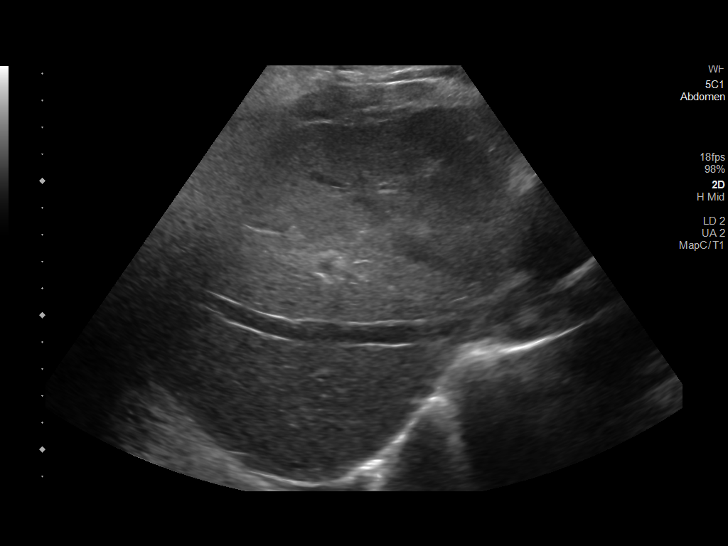
[im 37/49]
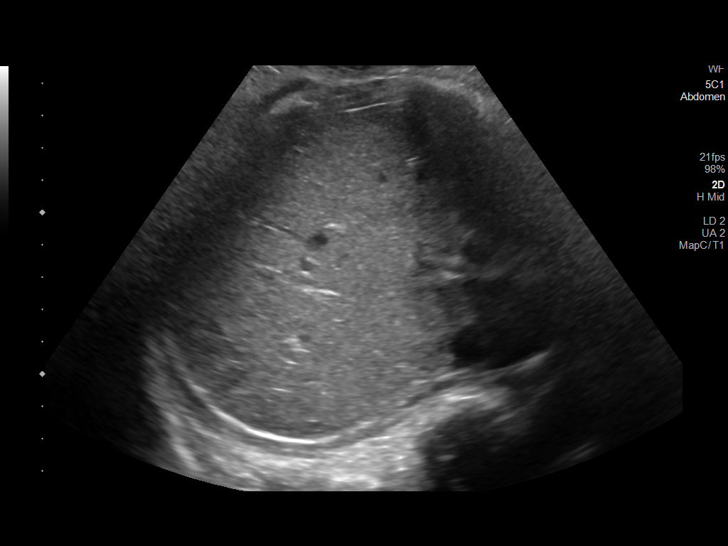
[im 41/49]
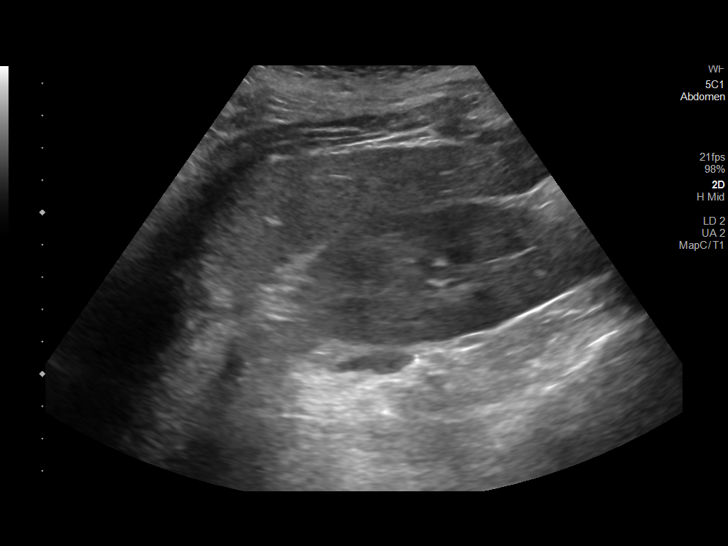
[im 45/49]
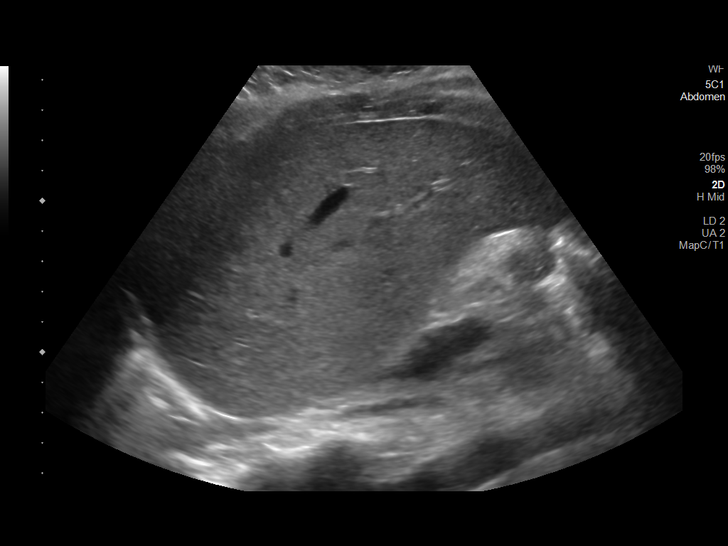
[im 49/49]
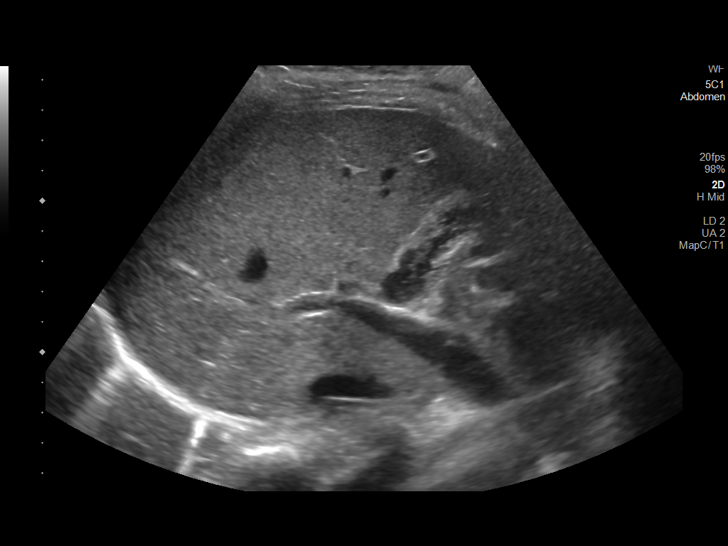

[14 of 25 positions shown; findings below may reference images not displayed]

FINDINGS: Gallbladder:

Gallbladder sludge. No wall thickening or pericholecystic fluid.
Sonographic Murphy's sign was not elicited.

Common bile duct:

Diameter: Normal, 3 mm.

Liver:

No focal lesion identified. Within normal limits in parenchymal
echogenicity. Portal vein is patent on color Doppler imaging with
normal direction of blood flow towards the liver.

Other: None.
IMPRESSION: Gallbladder sludge. No acute cholecystitis or biliary duct
dilatation.

## 2020-12-10 IMAGING — DX DG ABDOMEN 1V
1 series · 1 of 1 positions shown · non-contrast
Comparison: [DATE].

CLINICAL DATA: Nausea and vomiting in a 30-year-old female.

EXAM:
ABDOMEN - 1 VIEW

[abdomen]
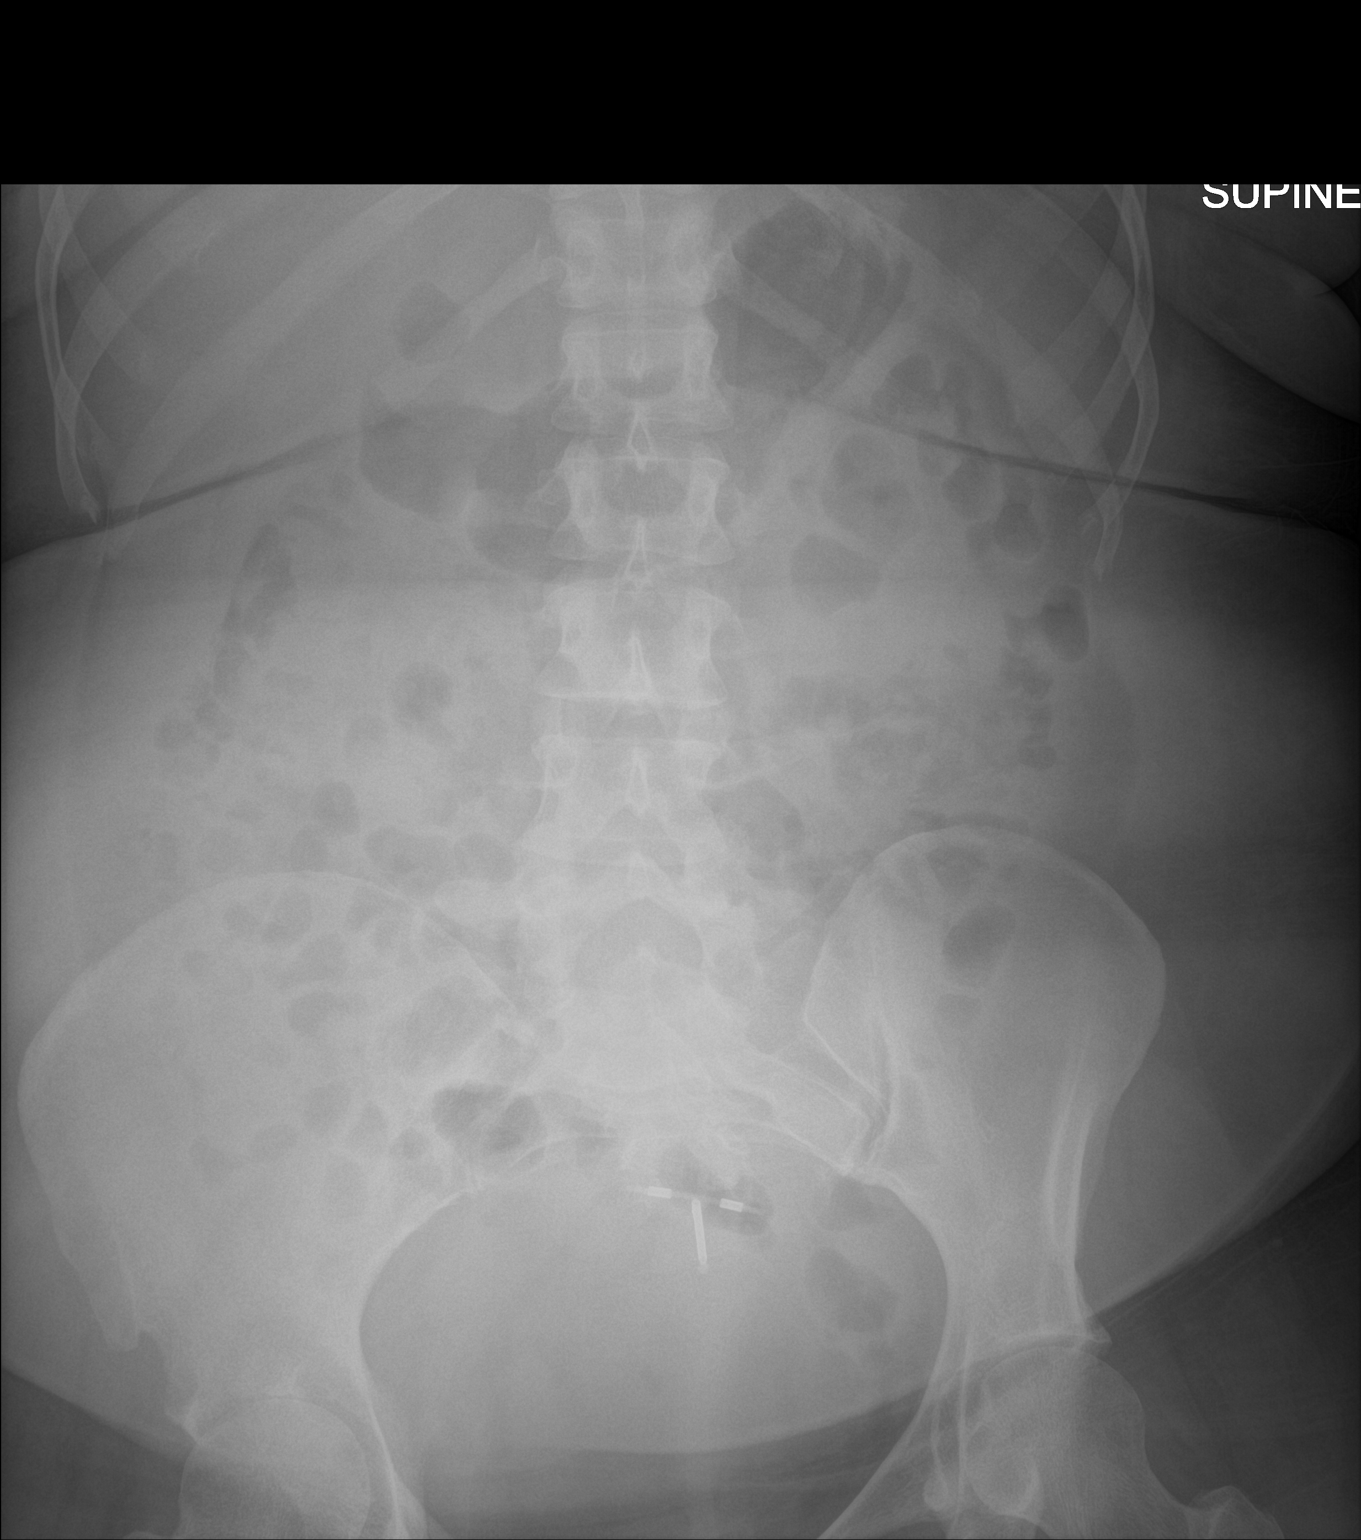

[1 of 1 positions shown; findings below may reference images not displayed]

FINDINGS: Scattered gas-filled loops of bowel throughout the abdomen. No sign
of bowel obstruction. Rectum not imaged. Gas along the descending
colon. No overtly distended loops of bowel.

IUD projects over the central pelvis.

On limited assessment no acute regional skeletal process.
IMPRESSION: Scattered gas-filled loops of bowel throughout the abdomen. No sign
of bowel obstruction.

## 2020-12-10 MED ORDER — VANCOMYCIN HCL 1750 MG/350ML IV SOLN
1750.0000 mg | Freq: Two times a day (BID) | INTRAVENOUS | Status: DC
  Filled 2020-12-10: qty 350

## 2020-12-10 MED ORDER — POTASSIUM CHLORIDE 2 MEQ/ML IV SOLN
INTRAVENOUS | Status: DC
  Filled 2020-12-10: qty 1000

## 2020-12-10 MED ORDER — VANCOMYCIN HCL 1750 MG/350ML IV SOLN
1750.0000 mg | Freq: Two times a day (BID) | INTRAVENOUS | Status: DC
  Administered 2020-12-10 – 2020-12-11 (×2): 1750 mg via INTRAVENOUS
  Filled 2020-12-10 (×3): qty 350

## 2020-12-10 MED ORDER — MAGNESIUM SULFATE 50 % IJ SOLN
3.0000 g | Freq: Once | INTRAVENOUS | Status: AC
  Administered 2020-12-10: 3 g via INTRAVENOUS
  Filled 2020-12-10: qty 6

## 2020-12-10 MED ORDER — POTASSIUM CHLORIDE CRYS ER 20 MEQ PO TBCR
40.0000 meq | EXTENDED_RELEASE_TABLET | Freq: Once | ORAL | Status: AC
  Administered 2020-12-10: 40 meq via ORAL
  Filled 2020-12-10: qty 2

## 2020-12-10 NOTE — Progress Notes (Signed)
Pharmacy Antibiotic Note  Chelsea Mathis is a 30 y.o. female admitted on 12/03/2020 with  MRSA endocarditis, continuation of outpatient abx .  Pharmacy has been consulted for vancomycin dosing.  Vanc level were therapeutic on last check on 9/4. SCr stable.   Plan: -Continue Vancomycin 1750mg  IV q12h. Per ID, new end date is 12/31/20  -Will check biweekly SCr and weekly vanc levels until then  -Plan for VP on 9/11 @ 0300, VT 9/11 1330   Temp (24hrs), Avg:98.1 F (36.7 C), Min:97.9 F (36.6 C), Max:98.5 F (36.9 C)  Recent Labs  Lab 12/03/20 1206 12/03/20 1334 12/03/20 2310 12/04/20 0554 12/04/20 1200 12/04/20 2030 12/05/20 0345 12/06/20 0318 12/07/20 1240 12/08/20 0407 12/10/20 0332  WBC 12.0*  --   --  13.3*  --   --  10.9* 9.4  --   --  7.9  CREATININE 0.43*  --   --  0.40*  --   --  0.32* 0.31* <0.30* 0.30* 0.34*  LATICACIDVEN  --  1.9 0.9  --   --   --   --   --   --   --   --   VANCOTROUGH  --   --   --   --   --  10*  --   --   --   --   --   VANCOPEAK  --   --   --   --  32  --   --   --   --   --   --      Estimated Creatinine Clearance: 106.9 mL/min (A) (by C-G formula based on SCr of 0.34 mg/dL (L)).    No Known Allergies   02/09/21, PharmD PGY2 Infectious Diseases Pharmacy Resident   Please check [email or web address] for unit-specific pharmacy phone numbers

## 2020-12-10 NOTE — Plan of Care (Signed)

## 2020-12-10 NOTE — TOC Progression Note (Signed)
Transition of Care [facility]) - Progression Note    Patient Details  Name: Chelsea Mathis MRN: [id number] Date of Birth: 1991/02/19  Transition of Care [facility]) CM/SW Contact  Bess Kinds, RN Phone Number: [phone] 12/10/2020, 4:41 PM  Clinical Narrative:     Follow up call to [facility] at [facility], shower stool ordered in August but no documentation of delivery. [facility] to deliver shower stool to bedside in the morning. TOC following for transition needs.   Expected Discharge Plan: Home w Home Health Services Barriers to Discharge: No Barriers Identified  Expected Discharge Plan and Services Expected Discharge Plan: Home w Home Health Services   Discharge Planning Services: CM Consult   Living arrangements for the past 2 months: Single Family Home Expected Discharge Date: 12/08/20               DME Arranged: Other see comment DME Agency: Other - Comment ([facility]) Date DME Agency Contacted: 12/06/20 Time DME Agency Contacted: 1628   HH Arranged: RN, PT, OT HH Agency: [facility] Date HH Agency Contacted: 12/06/20 Time HH Agency Contacted: 1629 Representative spoke with at [facility] Agency: Pearson Grippe   Social Determinants of Health (SDOH) Interventions    Readmission Risk Interventions No flowsheet data found.

## 2020-12-10 NOTE — Progress Notes (Addendum)
PROGRESS NOTE    Chelsea Mathis  PTW:[id number] DOB: 06-Mar-1991 DOA: 12/03/2020 PCP: Pcp, No   Brief Narrative:  Chelsea Mathis is a 30 y.o. female with medical history significant of DM2. 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  Admitted to our service from 8/1-8/12 followed by stay in rehab from 8/12-8/30. Pt presented with severe sepsis as well as DKA that admission. Multiple blood cultures positive for MRSA, pt put on IV vancomycin which she continues at home through PICC line. Due to myositis she also developed severe hip / pelvis pain which has been treated with Percocet PRN + Oxycontin scheduled. She has been taking the Oxycontin since discharge though she has held the percocet due to running low on this medication.  This admission she was found to have new right SI joint septic arthritis, left hip periarticular small abscess and questionable left pyelonephritis.  She was admitted for further treatment.      Subjective:  Patient in bed, appears comfortable, denies any headache, no fever, no chest pain or pressure, no shortness of breath , no abdominal pain. No new focal weakness, improved Nausea.     Assessment & Plan:    Intractable nausea and vomiting: Thought to be due to narcotic withdrawal, CT scan abdomen pelvis nonacute from GI perspective, right upper quadrant ultrasound done on 12/09/2020 stable, nausea vomiting is better continue to monitor with supportive care, abdominal exam is benign Will check an x-ray as well to rule out any obstruction.  If no further nausea vomiting likely discharge on 12/11/2020.  MRSA bacteremia, endocarditis and myositis: Continue vancomycin, ID on board. OPAT orders signed.  Sepsis secondary to septic arthritis of Right SI joint/left hip septic arthritis and possible abscess, POA: IR and Ortho were consulted not enough fluid anywhere to be drained in any joint, ID following the patient.  Currently plan is IV Vancomycin till 12/31/2020, also on  IV Rocephin last date of treatment 12/12/2020  Possible left pyelonephritis: As discussed above.  Patient on Rocephin now.  Hypokalemia/hypomagnesemia: Replaced will monitor.  Type 2 diabetes mellitus: Patient did not meet any criteria of DKA.  Recent hemoglobin A1c on 10/31/2020 was 12.3.  She was discharged on 24 units of Lantus from rehab.  She is only on SSI here and Lantus 15 units.  Blood sugar controlled.  Continue this regimen.    DVT prophylaxis: enoxaparin (LOVENOX) injection 40 mg Start: 12/03/20 2330   Code Status: Full Code  Family Communication: No family at bedside, discussed with significant other who the patient called on the phone at the time of examining the patient.    Status is: Inpatient  Remains inpatient appropriate because:Inpatient level of care appropriate due to severity of illness  Dispo: The patient is from: Home              Anticipated d/c is to: Home with home health              Patient currently is not medically stable to d/c.   Difficult to place patient No   Estimated body mass index is 32.47 kg/m as calculated from the following:   Height as of 11/11/20: 5\' 4"  (1.626 m).   Weight as of 11/29/20: 85.8 kg.   Nutritional Assessment: There is no height or weight on file to calculate BMI.. Seen by dietician.  I agree with the assessment and plan as outlined below: Nutrition Status: Nutrition Problem: Inadequate oral intake Etiology: poor appetite, nausea, vomiting Skin Assessment: I have examined  the patient's skin and I agree with the wound assessment as performed by the wound care RN as outlined below:    Consultants:  ID IR Orthopedics  Procedures:  None  Antimicrobials:  Anti-infectives (From admission, onward)    Start     Dose/Rate Route Frequency Ordered Stop   12/10/20 1400  vancomycin (VANCOREADY) IVPB 1750 mg/350 mL  Status:  Discontinued        1,750 mg 175 mL/hr over 120 Minutes Intravenous Every 12 hours 12/10/20 1146  12/10/20 1152   12/10/20 1400  vancomycin (VANCOREADY) IVPB 1750 mg/350 mL        1,750 mg 175 mL/hr over 120 Minutes Intravenous 2 times daily 12/10/20 1152     12/07/20 2100  vancomycin (VANCOREADY) IVPB 1750 mg/350 mL  Status:  Discontinued        1,750 mg 175 mL/hr over 120 Minutes Intravenous 2 times daily 12/07/20 1032 12/10/20 1146   12/07/20 0000  vancomycin IVPB        1,750 mg Intravenous Every 12 hours 12/07/20 1054 12/31/20 2359   12/07/20 0000  cefTRIAXone (ROCEPHIN) IVPB        2 g Intravenous Every 24 hours 12/07/20 1347 12/12/20 2359   12/06/20 1030  cefTRIAXone (ROCEPHIN) 2 g in sodium chloride 0.9 % 100 mL IVPB        2 g 200 mL/hr over 30 Minutes Intravenous Every 24 hours 12/06/20 0919 12/13/20 0959   12/03/20 2100  vancomycin (VANCOREADY) IVPB 1750 mg/350 mL  Status:  Discontinued        1,750 mg 175 mL/hr over 120 Minutes Intravenous 2 times daily 12/03/20 2049 12/07/20 1032   12/03/20 2030  cefTRIAXone (ROCEPHIN) 2 g in sodium chloride 0.9 % 100 mL IVPB        2 g 200 mL/hr over 30 Minutes Intravenous  Once 12/03/20 2018 12/03/20 2110        Objective: Vitals:   12/09/20 2132 12/10/20 0607 12/10/20 0820 12/10/20 1047  BP: 131/66 116/65 109/81 113/79  Pulse: 80 71 79 68  Resp: 16 18 17 16   Temp: 98.1 F (36.7 C) 98.1 F (36.7 C) 97.9 F (36.6 C) 98.5 F (36.9 C)  TempSrc: Oral Oral Oral Oral  SpO2: 97% 98% 99% 99%    Intake/Output Summary (Last 24 hours) at 12/10/2020 1153 Last data filed at 12/10/2020 0354 Gross per 24 hour  Intake 1950.28 ml  Output --  Net 1950.28 ml   There were no vitals filed for this visit.  Examination:  Awake Alert, No new F.N deficits, Normal affect [place].AT,PERRAL Supple Neck,No JVD, No cervical lymphadenopathy appriciated.  Symmetrical Chest wall movement, Good air movement bilaterally, CTAB RRR,No Gallops, Rubs or new Murmurs, No Parasternal Heave +ve B.Sounds, Abd Soft, No tenderness, No organomegaly appriciated,  No rebound - guarding or rigidity. No Cyanosis, Clubbing or edema, No new Rash or bruise   Data Reviewed: I have personally reviewed following labs and imaging studies Recent Labs  Lab 12/03/20 1206 12/03/20 1344 12/04/20 0554 12/05/20 0345 12/06/20 0318 12/10/20 0332  WBC 12.0*  --  13.3* 10.9* 9.4 7.9  HGB 10.6* 12.6 8.7* 8.7* 8.9* 8.6*  HCT 34.9* 37.0 27.9* 28.5* 29.1* 28.6*  PLT 508*  --  536* 525* 521* 502*  MCV 83.9  --  81.6 82.4 82.4 83.6  MCH 25.5*  --  25.4* 25.1* 25.2* 25.1*  MCHC 30.4  --  31.2 30.5 30.6 30.1  RDW 15.9*  --  15.9* 16.0* 16.0*  16.5*  LYMPHSABS 1.7  --   --  2.8 3.1  --   MONOABS 0.2  --   --  1.0 0.9  --   EOSABS 0.0  --   --  0.0 0.1  --   BASOSABS 0.0  --   --  0.1 0.1  --     Recent Labs  Lab 12/03/20 1206 12/03/20 1334 12/03/20 1344 12/03/20 2310 12/04/20 0554 12/04/20 1230 12/05/20 0345 12/06/20 0318 12/06/20 0951 12/07/20 1240 12/08/20 0407 12/10/20 0332  NA 136  --    < >  --  133*  --  136 136  --  135 135 137  K 3.4*  --    < >  --  3.0*  --  2.8* 3.0*  --  3.3* 3.2* 2.8*  CL 101  --   --   --  100  --  99 102  --  101 100 102  CO2 23  --   --   --  21*  --  27 27  --  27 26 28   GLUCOSE 174*  --   --   --  155*  --  110* 77  --  111* 111* 98  BUN <5*  --   --   --  3*  --  <5* <5*  --  5* <5* <5*  CREATININE 0.43*  --   --   --  0.40*  --  0.32* 0.31*  --  <0.30* 0.30* 0.34*  CALCIUM 9.1  --   --   --  8.8*  --  8.8* 8.6*  --  8.5* 8.7* 8.4*  AST 16  --   --   --  15  --   --   --  17  --   --  19  ALT 11  --   --   --  8  --   --   --  8  --   --  10  ALKPHOS 110  --   --   --  82  --   --   --  75  --   --  79  BILITOT 1.1  --   --   --  1.0  --   --   --  0.5  --   --  0.6  ALBUMIN 2.7*  --   --   --  2.3*  --   --   --  2.3*  --   --  2.2*  MG  --   --   --   --   --   --   --  1.5*  --   --   --  1.6*  CRP  --   --   --   --   --  4.7*  --   --   --   --   --   --   LATICACIDVEN  --  1.9  --  0.9  --   --   --   --   --    --   --   --   INR 1.3*  --   --   --   --   --   --   --   --   --   --   --    < > = values in this interval not displayed.       Radiology Studies: No results found.  Scheduled Meds:  Chlorhexidine  Gluconate Cloth  6 each Topical Daily   diclofenac Sodium  2 g Topical QID   enoxaparin (LOVENOX) injection  40 mg Subcutaneous QHS   feeding supplement (GLUCERNA SHAKE)  237 mL Oral BID BM   gabapentin  300 mg Oral TID   insulin aspart  0-9 Units Subcutaneous Q4H   insulin glargine-yfgn  15 Units Subcutaneous Daily   multivitamin with minerals  1 tablet Oral Daily   pantoprazole (PROTONIX) IV  40 mg Intravenous Q24H   propranolol  10 mg Oral Daily   Ensure Max Protein  11 oz Oral QHS   pyridOXINE  50 mg Oral Daily   senna-docusate  1 tablet Oral BID   tiZANidine  2 mg Oral TID   Continuous Infusions:  cefTRIAXone (ROCEPHIN)  IV 2 g (12/10/20 1113)   lactated ringers with kcl     lactated ringers 10 mL/hr at 12/10/20 0354   magnesium sulfate bolus IVPB     promethazine (PHENERGAN) injection (IM or IVPB) Stopped (12/09/20 1100)   vancomycin       LOS: 6 days   Time spent: 35 minutes  Signature  Susa Raring M.D on 12/10/2020 at 11:53 AM   -  To page go to [email or web address]

## 2020-12-11 DIAGNOSIS — R112 Nausea with vomiting, unspecified: Secondary | ICD-10-CM | POA: Diagnosis not present

## 2020-12-11 LAB — BASIC METABOLIC PANEL
Anion gap: 6 (ref 5–15)
BUN: <5 mg/dL — ABNORMAL LOW (ref 6–20)
CO2: 25 mmol/L (ref 22–32)
Calcium: 8.2 mg/dL — ABNORMAL LOW (ref 8.9–10.3)
Chloride: 105 mmol/L (ref 98–111)
Creatinine, Ser: 0.31 mg/dL — ABNORMAL LOW (ref 0.44–1.00)
GFR, Estimated: >60 mL/min (ref >60)
Glucose, Bld: 116 mg/dL — ABNORMAL HIGH (ref 70–99)
Potassium: 3.5 mmol/L (ref 3.5–5.1)
Sodium: 136 mmol/L (ref 135–145)

## 2020-12-11 LAB — GLUCOSE, CAPILLARY
Glucose-Capillary: 119 mg/dL — ABNORMAL HIGH (ref 70–99)
Glucose-Capillary: 120 mg/dL — ABNORMAL HIGH (ref 70–99)
Glucose-Capillary: 130 mg/dL — ABNORMAL HIGH (ref 70–99)
Glucose-Capillary: 98 mg/dL (ref 70–99)

## 2020-12-11 LAB — VANCOMYCIN, PEAK: Vancomycin Pk: 58 ug/mL (ref 30–40)

## 2020-12-11 LAB — MAGNESIUM: Magnesium: 1.7 mg/dL (ref 1.7–2.4)

## 2020-12-11 MED ORDER — POTASSIUM CHLORIDE 20 MEQ PO PACK
40.0000 meq | PACK | Freq: Once | ORAL | Status: AC
  Administered 2020-12-11: 40 meq via ORAL
  Filled 2020-12-11: qty 2

## 2020-12-11 MED ORDER — MAGNESIUM HYDROXIDE 400 MG/5ML PO SUSP
30.0000 mL | Freq: Two times a day (BID) | ORAL | Status: DC
  Filled 2020-12-11 (×2): qty 30

## 2020-12-11 NOTE — TOC Transition Note (Addendum)
Transition of Care [facility]) - CM/SW Discharge Note   Patient Details  Name: Chelsea Mathis MRN: [id number] Date of Birth: 08/18/1990  Transition [facility] Care [facility]) CM/SW Contact:  Bess Kinds, RN Phone Number: [phone] 12/11/2020, 10:21 AM   Clinical Narrative:     Notified by MD that patient stable for transition home today.   Notified Jason at [employer] - following for [facility] PT and OT only. Noted separate HH PT OT order with Face to Face written by provider on 9/7.   Notified [facility] at [phone] and left voicemail to advise of patient transition home today. Fax sent to [phone] with signed [facility] RN for resumption of services.   Notified [facility] at [phone]. Left message with answering service to advise of patient transition home today.   Notified Jasmine at [facility] of shower stool and RW need. Both to be delivered to bedside this morning.   Nursing aware of plans for transition home today. IV rocephin to be administered in hospital this morning prior to transition home.   No further TOC  needs identified at this time.   Update: Received call back from Scarlette Calico [phone]] at [facility] - someone from company will follow up with patient tomorrow. Appreciation expressed for notification.   Final next level of care: Home w Home Health Services Barriers to Discharge: No Barriers Identified   Patient Goals and CMS Choice     Choice offered to / list presented to : Patient  Discharge Placement                       Discharge Plan and Services   Discharge Planning Services: CM Consult            DME Arranged: Other see comment DME Agency: Other - Comment ([facility]) Date DME Agency Contacted: 12/06/20 Time DME Agency Contacted: [phone]   HH Arranged: RN, PT, OT HH Agency: [facility] Date HH Agency Contacted: 12/06/20 Time HH Agency Contacted: [phone] Representative spoke with at [facility] Agency:  Pearson Grippe  Social Determinants of Health (SDOH) Interventions     Readmission Risk Interventions No flowsheet data found.

## 2020-12-11 NOTE — Plan of Care (Signed)

## 2020-12-11 NOTE — Progress Notes (Signed)
AVS was given to the patient. Patient received DME.

## 2020-12-11 NOTE — Plan of Care (Signed)
  Problem: Education: Goal: Knowledge of General Education information will improve Description: Including pain rating scale, medication(s)/side effects and non-pharmacologic comfort measures 12/11/2020 0224 by Vincent Gros, RN Outcome: Progressing 12/11/2020 0224 by Vincent Gros, RN Outcome: Progressing   Problem: Health Behavior/Discharge Planning: Goal: Ability to manage health-related needs will improve 12/11/2020 0224 by Vincent Gros, RN Outcome: Progressing 12/11/2020 0224 by Vincent Gros, RN Outcome: Progressing   Problem: Clinical Measurements: Goal: Ability to maintain clinical measurements within normal limits will improve 12/11/2020 0224 by Vincent Gros, RN Outcome: Progressing 12/11/2020 0224 by Vincent Gros, RN Outcome: Progressing Goal: Will remain free from infection 12/11/2020 0224 by Vincent Gros, RN Outcome: Progressing 12/11/2020 0224 by Vincent Gros, RN Outcome: Progressing Goal: Diagnostic test results will improve 12/11/2020 0224 by Vincent Gros, RN Outcome: Progressing 12/11/2020 0224 by Vincent Gros, RN Outcome: Progressing Goal: Respiratory complications will improve 12/11/2020 0224 by Vincent Gros, RN Outcome: Progressing 12/11/2020 0224 by Vincent Gros, RN Outcome: Progressing Goal: Cardiovascular complication will be avoided 12/11/2020 0224 by Vincent Gros, RN Outcome: Progressing 12/11/2020 0224 by Vincent Gros, RN Outcome: Progressing   Problem: Activity: Goal: Risk for activity intolerance will decrease 12/11/2020 0224 by Vincent Gros, RN Outcome: Progressing 12/11/2020 0224 by Vincent Gros, RN Outcome: Progressing   Problem: Nutrition: Goal: Adequate nutrition will be maintained 12/11/2020 0224 by Vincent Gros, RN Outcome: Progressing 12/11/2020 0224 by Vincent Gros, RN Outcome: Progressing   Problem: Coping: Goal: Level of anxiety will  decrease 12/11/2020 0224 by Vincent Gros, RN Outcome: Progressing 12/11/2020 0224 by Vincent Gros, RN Outcome: Progressing   Problem: Elimination: Goal: Will not experience complications related to bowel motility 12/11/2020 0224 by Vincent Gros, RN Outcome: Progressing 12/11/2020 0224 by Vincent Gros, RN Outcome: Progressing Goal: Will not experience complications related to urinary retention 12/11/2020 0224 by Vincent Gros, RN Outcome: Progressing 12/11/2020 0224 by Vincent Gros, RN Outcome: Progressing   Problem: Pain Managment: Goal: General experience of comfort will improve 12/11/2020 0224 by Vincent Gros, RN Outcome: Progressing 12/11/2020 0224 by Vincent Gros, RN Outcome: Progressing   Problem: Safety: Goal: Ability to remain free from injury will improve 12/11/2020 0224 by Vincent Gros, RN Outcome: Progressing 12/11/2020 0224 by Vincent Gros, RN Outcome: Progressing   Problem: Skin Integrity: Goal: Risk for impaired skin integrity will decrease 12/11/2020 0224 by Vincent Gros, RN Outcome: Progressing 12/11/2020 0224 by Vincent Gros, RN Outcome: Progressing

## 2020-12-11 NOTE — Progress Notes (Signed)
PROGRESS NOTE    Chelsea Mathis  NIO:[id number] DOB: 1990/07/29 DOA: 12/03/2020 PCP: Pcp, No   Brief Narrative:  Chelsea Mathis is a 30 y.o. female with medical history significant of DM2. 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  Admitted to our service from 8/1-8/12 followed by stay in rehab from 8/12-8/30. Pt presented with severe sepsis as well as DKA that admission. Multiple blood cultures positive for MRSA, pt put on IV vancomycin which she continues at home through PICC line. Due to myositis she also developed severe hip / pelvis pain which has been treated with Percocet PRN + Oxycontin scheduled. She has been taking the Oxycontin since discharge though she has held the percocet due to running low on this medication.  This admission she was found to have new right SI joint septic arthritis, left hip periarticular small abscess and questionable left pyelonephritis.  She was admitted for further treatment.      Subjective:  Patient in bed, appears comfortable, denies any headache, no fever, no chest pain or pressure, no shortness of breath , no abdominal pain. No new focal weakness.      Assessment & Plan:   Note patient was discharged by Dr. Jacqulyn Bath on 12/09/2020 could not be sent home due to nausea vomiting last 2 days, she is back to her baseline will be discharged as per the plan by Dr. Jacqulyn Bath.  No changes in the discharge medications and plan has been made.   Intractable nausea and vomiting: Thought to be due to pyelonephritis and most likely Rocephin reaction, only 1 more dose of Rocephin left, CT scan abdomen pelvis nonacute from GI perspective, right upper quadrant ultrasound done on 12/09/2020 stable, abdominal exam and x-ray are benign as well, but symptoms resolved after supportive care and she will be discharged home with some as needed Zofran, currently symptom-free.  MRSA bacteremia, endocarditis and myositis: Continue vancomycin, ID on board. OPAT orders signed.  She  has a right arm PICC line.  Sepsis secondary to septic arthritis of Right SI joint/left hip septic arthritis and possible abscess, POA: IR and Ortho were consulted not enough fluid anywhere to be drained in any joint, ID following the patient.  Currently plan is IV Vancomycin till 12/31/2020, also on IV Rocephin last date of treatment 12/12/2020  Possible left pyelonephritis: As discussed above.  Patient on Rocephin now.  Hypokalemia/hypomagnesemia: Replaced will monitor.  Type 2 diabetes mellitus: Patient did not meet any criteria of DKA.  Recent hemoglobin A1c on 10/31/2020 was 12.3.  She was discharged on 24 units of Lantus from rehab.  She is only on SSI here and Lantus 15 units.  Blood sugar controlled.  Continue this regimen.    DVT prophylaxis: enoxaparin (LOVENOX) injection 40 mg Start: 12/03/20 2330   Code Status: Full Code  Family Communication: No family at bedside, discussed with significant other who the patient called on the phone at the time of examining the patient.    Status is: Inpatient  Remains inpatient appropriate because:Inpatient level of care appropriate due to severity of illness  Dispo: The patient is from: Home              Anticipated d/c is to: Home with home health              Patient currently is not medically stable to d/c.   Difficult to place patient No   Estimated body mass index is 32.47 kg/m as calculated from the following:   Height as  of 11/11/20: 5\' 4"  (1.626 m).   Weight as of 11/29/20: 85.8 kg.   Nutritional Assessment: There is no height or weight on file to calculate BMI.. Seen by dietician.  I agree with the assessment and plan as outlined below: Nutrition Status: Nutrition Problem: Inadequate oral intake Etiology: poor appetite, nausea, vomiting Skin Assessment: I have examined the patient's skin and I agree with the wound assessment as performed by the wound care RN as outlined below:    Consultants:   ID IR Orthopedics  Procedures:  None  Antimicrobials:  Anti-infectives (From admission, onward)    Start     Dose/Rate Route Frequency Ordered Stop   12/10/20 1400  vancomycin (VANCOREADY) IVPB 1750 mg/350 mL  Status:  Discontinued        1,750 mg 175 mL/hr over 120 Minutes Intravenous Every 12 hours 12/10/20 1146 12/10/20 1152   12/10/20 1400  vancomycin (VANCOREADY) IVPB 1750 mg/350 mL  Status:  Discontinued        1,750 mg 175 mL/hr over 120 Minutes Intravenous 2 times daily 12/10/20 1152 12/11/20 0424   12/07/20 2100  vancomycin (VANCOREADY) IVPB 1750 mg/350 mL  Status:  Discontinued        1,750 mg 175 mL/hr over 120 Minutes Intravenous 2 times daily 12/07/20 1032 12/10/20 1146   12/07/20 0000  vancomycin IVPB        1,750 mg Intravenous Every 12 hours 12/07/20 1054 12/31/20 2359   12/07/20 0000  cefTRIAXone (ROCEPHIN) IVPB        2 g Intravenous Every 24 hours 12/07/20 1347 12/12/20 2359   12/06/20 1030  cefTRIAXone (ROCEPHIN) 2 g in sodium chloride 0.9 % 100 mL IVPB        2 g 200 mL/hr over 30 Minutes Intravenous Every 24 hours 12/06/20 0919 12/13/20 0959   12/03/20 2100  vancomycin (VANCOREADY) IVPB 1750 mg/350 mL  Status:  Discontinued        1,750 mg 175 mL/hr over 120 Minutes Intravenous 2 times daily 12/03/20 2049 12/07/20 1032   12/03/20 2030  cefTRIAXone (ROCEPHIN) 2 g in sodium chloride 0.9 % 100 mL IVPB        2 g 200 mL/hr over 30 Minutes Intravenous  Once 12/03/20 2018 12/03/20 2110        Objective: Vitals:   12/10/20 1047 12/10/20 1451 12/10/20 2043 12/11/20 0732  BP: 113/79 126/85 125/75 108/69  Pulse: 68 79 80 80  Resp: 16 16 16 18   Temp: 98.5 F (36.9 C) 98 F (36.7 C) 98 F (36.7 C) 98.8 F (37.1 C)  TempSrc: Oral Oral Oral Oral  SpO2: 99% 100% 100% 98%    Intake/Output Summary (Last 24 hours) at 12/11/2020 0946 Last data filed at 12/10/2020 2312 Gross per 24 hour  Intake 1499.85 ml  Output --  Net 1499.85 ml   There were no vitals  filed for this visit.  Examination:  Awake Alert, No new F.N deficits, Normal affect [place].AT,PERRAL Supple Neck,No JVD, No cervical lymphadenopathy appriciated.  Symmetrical Chest wall movement, Good air movement bilaterally, CTAB RRR,No Gallops, Rubs or new Murmurs, No Parasternal Heave +ve B.Sounds, Abd Soft, No tenderness, No organomegaly appriciated, No rebound - guarding or rigidity. No Cyanosis, Clubbing or edema, No new Rash or bruise   Data Reviewed: I have personally reviewed following labs and imaging studies Recent Labs  Lab 12/05/20 0345 12/06/20 0318 12/10/20 0332  WBC 10.9* 9.4 7.9  HGB 8.7* 8.9* 8.6*  HCT 28.5* 29.1* 28.6*  PLT  525* 521* 502*  MCV 82.4 82.4 83.6  MCH 25.1* 25.2* 25.1*  MCHC 30.5 30.6 30.1  RDW 16.0* 16.0* 16.5*  LYMPHSABS 2.8 3.1  --   MONOABS 1.0 0.9  --   EOSABS 0.0 0.1  --   BASOSABS 0.1 0.1  --     Recent Labs  Lab 12/04/20 1230 12/05/20 0345 12/06/20 0318 12/06/20 0951 12/07/20 1240 12/08/20 0407 12/10/20 0332 12/11/20 0301  NA  --    < > 136  --  135 135 137 136  K  --    < > 3.0*  --  3.3* 3.2* 2.8* 3.5  CL  --    < > 102  --  101 100 102 105  CO2  --    < > 27  --  27 26 28 25   GLUCOSE  --    < > 77  --  111* 111* 98 116*  BUN  --    < > <5*  --  5* <5* <5* <5*  CREATININE  --    < > 0.31*  --  <0.30* 0.30* 0.34* 0.31*  CALCIUM  --    < > 8.6*  --  8.5* 8.7* 8.4* 8.2*  AST  --   --   --  17  --   --  19  --   ALT  --   --   --  8  --   --  10  --   ALKPHOS  --   --   --  75  --   --  79  --   BILITOT  --   --   --  0.5  --   --  0.6  --   ALBUMIN  --   --   --  2.3*  --   --  2.2*  --   MG  --   --  1.5*  --   --   --  1.6* 1.7  CRP 4.7*  --   --   --   --   --   --   --    < > = values in this interval not displayed.       Radiology Studies: DG Abd 1 View  Result Date: 12/10/2020 CLINICAL DATA:  Nausea and vomiting in a 30 year old female. EXAM: ABDOMEN - 1 VIEW COMPARISON:  November 06, 2020. FINDINGS: Scattered  gas-filled loops of bowel throughout the abdomen. No sign of bowel obstruction. Rectum not imaged. Gas along the descending colon. No overtly distended loops of bowel. IUD projects over the central pelvis. On limited assessment no acute regional skeletal process. IMPRESSION: Scattered gas-filled loops of bowel throughout the abdomen. No sign of bowel obstruction. Electronically Signed   By: Donzetta KohutGeoffrey  Wile M.D.   On: 12/10/2020 15:06   US Abdomen Limited RUQ (LIVER/GB)  Result Date: 12/10/2020 CLINICAL DATA:  Abdominal pain EXAM: ULTRASOUND ABDOMEN LIMITED RIGHT UPPER QUADRANT COMPARISON:  12/05/2020 FINDINGS: Gallbladder: Gallbladder sludge. No wall thickening or pericholecystic fluid. Sonographic Murphy's sign was not elicited. Common bile duct: Diameter: Normal, 3 mm. Liver: No focal lesion identified. Within normal limits in parenchymal echogenicity. Portal vein is patent on color Doppler imaging with normal direction of blood flow towards the liver. Other: None. IMPRESSION: Gallbladder sludge. No acute cholecystitis or biliary duct dilatation. Electronically Signed   By: Jeronimo GreavesKyle  Talbot M.D.   On: 12/10/2020 11:51    Scheduled Meds:  Chlorhexidine Gluconate Cloth  6 each Topical Daily  diclofenac Sodium  2 g Topical QID   enoxaparin (LOVENOX) injection  40 mg Subcutaneous QHS   feeding supplement (GLUCERNA SHAKE)  237 mL Oral BID BM   gabapentin  300 mg Oral TID   insulin aspart  0-9 Units Subcutaneous Q4H   insulin glargine-yfgn  15 Units Subcutaneous Daily   magnesium hydroxide  30 mL Oral BID   multivitamin with minerals  1 tablet Oral Daily   pantoprazole (PROTONIX) IV  40 mg Intravenous Q24H   potassium chloride  40 mEq Oral Once   propranolol  10 mg Oral Daily   Ensure Max Protein  11 oz Oral QHS   pyridOXINE  50 mg Oral Daily   senna-docusate  1 tablet Oral BID   tiZANidine  2 mg Oral TID   Continuous Infusions:  cefTRIAXone (ROCEPHIN)  IV 2 g (12/10/20 1113)   lactated ringers 10  mL/hr at 12/10/20 2312   promethazine (PHENERGAN) injection (IM or IVPB) Stopped (12/09/20 1100)     LOS: 7 days   Time spent: 35 minutes  Signature  Susa Raring M.D on 12/11/2020 at 9:46 AM   -  To page go to [email or web address]

## 2020-12-11 NOTE — Plan of Care (Signed)
  Problem: Activity: Goal: Risk for activity intolerance will decrease Outcome: Progressing   Problem: Coping: Goal: Level of anxiety will decrease Outcome: Progressing   Problem: Pain Managment: Goal: General experience of comfort will improve Outcome: Progressing   

## 2020-12-12 ENCOUNTER — Other Ambulatory Visit: Payer: Self-pay

## 2020-12-12 ENCOUNTER — Other Ambulatory Visit: Payer: Self-pay | Admitting: Physical Medicine and Rehabilitation

## 2020-12-12 ENCOUNTER — Ambulatory Visit (INDEPENDENT_AMBULATORY_CARE_PROVIDER_SITE_OTHER): Payer: Medicaid Other | Admitting: Infectious Diseases

## 2020-12-12 ENCOUNTER — Encounter: Payer: Self-pay | Admitting: Infectious Diseases

## 2020-12-12 ENCOUNTER — Other Ambulatory Visit (HOSPITAL_COMMUNITY): Payer: Self-pay

## 2020-12-12 VITALS — BP 115/79 | HR 82 | Temp 98.2°F

## 2020-12-12 DIAGNOSIS — M4658 Other infective spondylopathies, sacral and sacrococcygeal region: Secondary | ICD-10-CM | POA: Diagnosis not present

## 2020-12-12 MED ORDER — OXYCODONE-ACETAMINOPHEN 5-325 MG PO TABS: 1.0000 | ORAL_TABLET | ORAL | 0 refills | Status: DC | PRN

## 2020-12-12 NOTE — Progress Notes (Addendum)
[facility]                                                             [place], [place], [place], [address]                                                                  Phn. 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  Medication Monitoring: Vancomycin peak 58  Plan Complete IV ceftriaxone today to complete 10 days of tx for Pyelonephritis Pharmacy to dose Vancomycin  Will follow up in 2-3 weeks and plan for repeat MRI pelvis before planning to DC abtx CBC, CMP, ESR and CRP weekly, add on ESR and CRP in next set of labs with [facility] Follow up in 2-3 weeks before planned end date on 10/1/2 Pain control.   All questions and concerns were discussed and addressed. Patient verbalized understanding of the plan. ____________________________________________________________________________________________________________________ 12/12/20 Here for HFU in the setting of Complicated MRSA bacteremia/TV endocarditis/Rt SI joint septic arthritis. She is accompanied by her fiance. She will have last dose of IV ceftriaxone today on 9/12 and will complete 10 days of IV ceftriaxone for possible pyelonephritis. Denies fevers, chills and sweats. Denies nausea, vomiting, abdominal pain and diarrhea. Continues to have rt sided hip pain. Unfortunately, she was not able to get analgesics when discharged last hospital admission  and has been struggling managing her pain. I tried prescribing percocet to her but saw that she already had an active order prescribed for percocet by Dr Adam Phenix today. Rt hip pain can go up to 7/10 but she says it is somewhat improving. She has significant pain in flexion and external rotation of rt hip. I have contacted Dr Adam Phenix regarding percocet pills as patient finace says he was told that a peer to peer is needed for getting percocet from Pharmacy.   ROS: Constitutional: Negative for fever, chills, activity change, appetite change, fatigue and unexpected weight change.  Respiratory: Negative for cough, shortness of breath Cardiovascular: Negative for chest pain, palpitations and leg swelling.  Gastrointestinal: Negative for nausea, vomiting, abdominal pain, diarrhea/constipation, .  Genitourinary: Negative for dysuria, hematuria, flank pain Musculoskeletal: Negative for myalgias, back pain, joint swelling Skin: Negative for rashes, lesions  Neurological: Negative for weakness, dizziness  or headache  Past Medical History:  Diagnosis Date   Diabetes mellitus ([place])    Gestational diabetes    Past Surgical History:  Procedure Laterality Date   LAPAROSCOPIC APPENDECTOMY N/A 11/11/2016   Procedure: APPENDECTOMY LAPAROSCOPIC;  Surgeon: Georganna Skeans, MD;  Location: [facility];  Service: General;  Laterality: N/A;   TEE WITHOUT CARDIOVERSION N/A 11/03/2020   Procedure: TRANSESOPHAGEAL ECHOCARDIOGRAM (TEE);  Surgeon: Buford Dresser, MD;  Location: [facility] ENDOSCOPY;  Service: Cardiovascular;  Laterality: N/A;   Current Outpatient Medications on File Prior to Visit  Medication Sig Dispense Refill   cefTRIAXone (ROCEPHIN) IVPB Inject 2 g into the vein daily for 5 days. Indication:  ? Pyelo First Dose: Yes Last Day of Therapy:  12/12/20 Labs - Once weekly:  CBC/D and BMP, Labs - Every other week:  ESR and CRP Method of administration: IV Push Method of administration may be changed at the  discretion of home infusion pharmacist based upon assessment of the patient and/or caregiver's ability to self-administer the medication ordered. 5 Units 0   diclofenac Sodium (VOLTAREN) 1 % GEL Apply 2 g topically 4 (four) times daily. 100 g 0   gabapentin (NEURONTIN) 300 MG capsule Take 1 capsule (300 mg total) by mouth 3 (three) times daily. 90 capsule 0   hydrOXYzine (ATARAX/VISTARIL) 25 MG tablet Take 1 tablet (25 mg total) by mouth 2 (two) times daily as needed for anxiety. 30 tablet 0   oxyCODONE (OXYCONTIN) 10 mg 12 hr tablet Take 1 tablet (10 mg total) by mouth every 12 (twelve) hours. 14 tablet 0   propranolol (INDERAL) 10 MG tablet Take 1 tablet (10 mg total) by mouth daily. 30 tablet 0   pyridOXINE (B-6) 50 MG tablet Take 1 tablet (50 mg total) by mouth daily. 30 tablet 0   tiZANidine (ZANAFLEX) 2 MG tablet Take 1 tablet (2 mg total) by mouth 3 (three) times daily. 90 tablet 0   vancomycin IVPB Inject 1,750 mg into the vein every 12 (twelve) hours for 24 days. Indication:  MRSA endocarditis- new septic arthritis First Dose: Yes Last Day of Therapy:  12/31/20 Labs - Sunday/Monday:  CBC/D, BMP, and vancomycin trough. Labs - Thursday:  BMP and vancomycin trough Labs - Every other week:  ESR and CRP Method of administration:Elastomeric Method of administration may be changed at the discretion of the patient and/or caregiver's ability to self-administer the medication ordered. 48 Units 0   acetaminophen (TYLENOL) 325 MG tablet Take 2 tablets (650 mg total) by mouth every 6 (six) hours as needed for mild pain, fever or headache. (Patient not taking: Reported on 12/12/2020)     methocarbamol (ROBAXIN) 750 MG tablet Take 1 tablet (750 mg total) by mouth every 6 (six) hours as needed for muscle spasms (if zanaflex doesn't work). (Patient not taking: Reported on 12/12/2020) 60 tablet 0   Multiple Vitamin (MULTIVITAMIN WITH MINERALS) TABS tablet Take 1 tablet by mouth daily. (Patient not taking:  Reported on 12/12/2020)     ondansetron (ZOFRAN ODT) 4 MG disintegrating tablet Take 1 tablet (4 mg total) by mouth every 8 (eight) hours as needed for nausea or vomiting. (Patient not taking: Reported on 12/12/2020) 20 tablet 0   senna-docusate (SENOKOT-S) 8.6-50 MG tablet Take 1 tablet by mouth 2 (two) times daily. (Patient not taking: No sig reported)     No current facility-administered medications on file prior to visit.   No Known Allergies  Social History   Socioeconomic History   Marital status: Single  Spouse name: Not on file   Number of children: Not on file   Years of education: Not on file   Highest education level: Not on file  Occupational History   Not on file  Tobacco Use   Smoking status: Never   Smokeless tobacco: Never  Vaping Use   Vaping Use: Never used  Substance and Sexual Activity   Alcohol use: No    Alcohol/week: 0.0 standard drinks   Drug use: No   Sexual activity: Yes  Other Topics Concern   Not on file  Social History Narrative   Not on file   Social Determinants of Health   Financial Resource Strain: Not on file  Food Insecurity: Not on file  Transportation Needs: Not on file  Physical Activity: Not on file  Stress: Not on file  Social Connections: Not on file  Intimate Partner Violence: Not on file    Vitals BP 115/79   Pulse 82   Temp 98.2 F (36.8 C) (Oral)   LMP 11/14/2020   SpO2 100%   Breastfeeding No    Examination  General - not in acute distress, comfortably sitting in chair, has a walker to walk  HEENT - PEERLA, no pallor and no icterus Chest - b/l clear air entry, no additional sounds CVS- Normal s1s2, RRR Abdomen - Soft, Non tender , non distended Ext- no pedal edema, RT hip ROM flexion and external rotation restricted due to pain. Left hip ROM is good.  Neuro: grossly normal Back - WNL Psych : calm and cooperative  Recent labs CBC Latest Ref Rng & Units 12/10/2020 12/06/2020 12/05/2020  WBC 4.0 - 10.5 K/uL 7.9  9.4 10.9(H)  Hemoglobin 12.0 - 15.0 g/dL 8.6(L) 8.9(L) 8.7(L)  Hematocrit 36.0 - 46.0 % 28.6(L) 29.1(L) 28.5(L)  Platelets 150 - 400 K/uL 502(H) 521(H) 525(H)   CMP Latest Ref Rng & Units 12/11/2020 12/10/2020 12/08/2020  Glucose 70 - 99 mg/dL 116(H) 98 111(H)  BUN 6 - 20 mg/dL <5(L) <5(L) <5(L)  Creatinine 0.44 - 1.00 mg/dL 0.31(L) 0.34(L) 0.30(L)  Sodium 135 - 145 mmol/L 136 137 135  Potassium 3.5 - 5.1 mmol/L 3.5 2.8(L) 3.2(L)  Chloride 98 - 111 mmol/L 105 102 100  CO2 22 - 32 mmol/L _0 Calcium 8.9 - 10.3 mg/dL 8.2(L) 8.4(L) 8.7(L)  Total Protein 6.5 - 8.1 g/dL - 6.7 -  Total Bilirubin 0.3 - 1.2 mg/dL - 0.6 -  Alkaline Phos 38 - 126 U/L - 79 -  AST 15 - 41 U/L - 19 -  ALT 0 - 44 U/L - 10 -    Pertinent Microbiology Results for orders placed or performed during the hospital encounter of 12/03/20  Blood Culture (routine x 2)     Status: None   Collection Time: 12/03/20 12:06 PM   Specimen: BLOOD  Result Value Ref Range Status   Specimen Description BLOOD BLOOD RIGHT HAND  Final   Special Requests   Final    BOTTLES DRAWN AEROBIC AND ANAEROBIC Blood Culture results may not be optimal due to an inadequate volume of blood received in culture bottles   Culture   Final    NO GROWTH 5 DAYS Performed at [facility], [place] [address] [place], [place] [address]    Report Status 12/08/2020 FINAL  Final  Blood Culture (routine x 2)     Status: None   Collection Time: 12/03/20 12:11 PM   Specimen: BLOOD  Result Value Ref Range Status  Specimen Description BLOOD RIGHT ANTECUBITAL  Final   Special Requests   Final    BOTTLES DRAWN AEROBIC AND ANAEROBIC Blood Culture adequate volume   Culture   Final    NO GROWTH 5 DAYS Performed at [facility], [address] [place], [place] [address]    Report Status 12/08/2020 FINAL  Final  Urine Culture     Status: Abnormal   Collection Time: 12/03/20  8:54 PM   Specimen: Urine, Clean Catch  Result Value Ref Range Status    Specimen Description URINE, CLEAN CATCH  Final   Special Requests   Final    NONE Performed at [facility], [place] [address] [place], [place] [address]    Culture MULTIPLE SPECIES PRESENT, SUGGEST RECOLLECTION (A)  Final   Report Status 12/05/2020 FINAL  Final  SARS CORONAVIRUS 2 (TAT 6-24 HRS) Nasopharyngeal Nasopharyngeal Swab     Status: None   Collection Time: 12/03/20  9:47 PM   Specimen: Nasopharyngeal Swab  Result Value Ref Range Status   SARS Coronavirus 2 NEGATIVE NEGATIVE Final    Comment: (NOTE) SARS-CoV-2 target nucleic acids are NOT DETECTED.  The SARS-CoV-2 RNA is generally detectable in upper and lower respiratory specimens during the acute phase of infection. Negative results do not preclude SARS-CoV-2 infection, do not rule out co-infections with other pathogens, and should not be used as the sole basis for treatment or other patient management decisions. Negative results must be combined with clinical observations, patient history, and epidemiological information. The expected result is Negative.  Fact Sheet for Patients: SugarRoll.be  Fact Sheet for Healthcare Providers: https://www.woods-mathews.com/  This test is not yet approved or cleared by the [place] FDA and  has been authorized for detection and/or diagnosis of SARS-CoV-2 by FDA under an Emergency Use Authorization (EUA). This EUA will remain  in effect (meaning this test can be used) for the duration of the COVID-19 declaration under Se ction 564(b)(1) of the Act, 21 U.S.C. section 360bbb-3(b)(1), unless the authorization is terminated or revoked sooner.  Performed at [facility], [place] [address], [place], [place] [address]   MRSA Next Gen by PCR, Nasal     Status: None   Collection Time: 12/04/20  7:47 AM   Specimen: Nasal Mucosa; Nasal Swab  Result Value Ref Range Status   MRSA by PCR Next Gen NOT DETECTED NOT DETECTED Final     Comment: (NOTE) The GeneXpert MRSA Assay (FDA approved for NASAL specimens only), is one component of a comprehensive MRSA colonization surveillance program. It is not intended to diagnose MRSA infection nor to guide or monitor treatment for MRSA infections. Test performance is not FDA approved in patients less than 63 years old. Performed at [facility], [place] [address], [place], [place] [address]     Pertinent Imaging All pertinent labs/Imagings/notes reviewed. All pertinent plain films and CT images have been personally visualized and interpreted; radiology reports have been reviewed. Decision making incorporated into the Impression / Recommendations.  I have spent a total of 30 minutes of face-to-face and non-face-to-face time, excluding clinical staff time, preparing to see patient, ordering tests and/or medications, and provide counseling the patient    Electronically signed by:  Rosiland Oz, MD Infectious Disease Physician [facility] [address] [place],  [address] Phone: [phone]  Fax: [phone]

## 2020-12-13 ENCOUNTER — Telehealth: Payer: Self-pay | Admitting: Physical Medicine and Rehabilitation

## 2020-12-13 NOTE — Telephone Encounter (Signed)
Patient is needing prior authorization on her oxycodone.  Please call patient when this is done, her number is [phone].

## 2020-12-14 NOTE — Telephone Encounter (Signed)
Prior Auth for oxycodone acetaminophen 5/325 #[id number] submitted to [facility] Medicaid via CoverMyMeds.

## 2020-12-14 NOTE — Telephone Encounter (Signed)
Notified medication was approved. No dates given. Pharmacy has already called her to let her know its ready.

## 2020-12-15 ENCOUNTER — Telehealth: Payer: Self-pay | Admitting: *Deleted

## 2020-12-15 NOTE — Telephone Encounter (Signed)
Received a call from Allie PT [facility].  Asking for POC 1wk1,2wk4, 1wk4.  In looking at chart, she went back into the hospital after our 11/29/20 discharge and the hospitalist ordered [facility] but she does not have a PCP yet (see one 12/28/20). She is to return to our office to see our NP 10/5 and Dr Carlis Abbott after. I have approved the orders since she does not have current pcp.

## 2020-12-18 ENCOUNTER — Emergency Department (HOSPITAL_COMMUNITY)
Admission: EM | Admit: 2020-12-18 | Discharge: 2020-12-18 | Disposition: A | Discharge status: Home / Self Care | Payer: Medicaid Other | Attending: Emergency Medicine | Admitting: Emergency Medicine

## 2020-12-18 ENCOUNTER — Encounter (HOSPITAL_COMMUNITY): Payer: Self-pay | Admitting: Emergency Medicine

## 2020-12-18 DIAGNOSIS — E1110 Type 2 diabetes mellitus with ketoacidosis without coma: Secondary | ICD-10-CM | POA: Diagnosis not present

## 2020-12-18 DIAGNOSIS — T82898A Other specified complication of vascular prosthetic devices, implants and grafts, initial encounter: Secondary | ICD-10-CM | POA: Diagnosis not present

## 2020-12-18 NOTE — ED Provider Notes (Signed)
[facility] EMERGENCY DEPARTMENT Provider Note   CSN: [id number] Arrival date & time: 12/18/20  1844     History No chief complaint on file.   Chelsea Mathis is a 30 y.o. female.  HPI Chelsea Mathis , a 30 y.o. female  was evaluated in triage.  Pt complains of PICC line in right upper extremity not drawing blood she states that she is on vancomycin for endocarditis.  Has not had issues administering her medications but has not been able to have her lab draws.  Denies any fevers chills nausea vomiting chest pain shortness of breath lightness dizziness.  No other associate symptoms.  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[place]) 12/03/2020   Chronic pain syndrome 12/03/2020   Labile blood glucose    Acute blood loss anemia    Leukocytosis    Hyponatremia    Acute bacterial endocarditis    Pain of right thigh    Adjustment disorder with mixed anxiety and depressed mood 11/10/2020   Myositis 11/04/2020   Myofasciitis 11/04/2020   Bacteremia due to Staphylococcus aureus 11/02/2020   Septic pulmonary embolism ([place]) 11/02/2020   DM2 (diabetes mellitus, type 2) ([place]) 11/02/2020   Sepsis ([place]) 11/02/2020   DKA (diabetic ketoacidosis) ([place]) 10/31/2020   Acute appendicitis 11/10/2016   Postpartum care following vaginal delivery 05/13/2015   Gestational diabetes mellitus (GDM) affecting pregnancy 05/13/2015    Past Surgical History:  Procedure Laterality Date   LAPAROSCOPIC APPENDECTOMY N/A 11/11/2016   Procedure: APPENDECTOMY LAPAROSCOPIC;  Surgeon: Georganna Skeans, MD;  Location: [facility];  Service: General;  Laterality: N/A;   TEE WITHOUT CARDIOVERSION N/A 11/03/2020   Procedure: TRANSESOPHAGEAL ECHOCARDIOGRAM (TEE);  Surgeon:  Buford Dresser, MD;  Location: [facility] ENDOSCOPY;  Service: Cardiovascular;  Laterality: N/A;     OB History     Gravida  3   Para  2   Term  2   Preterm      AB  1   Living  2      SAB      IAB      Ectopic      Multiple  0   Live Births  2           Family History  Problem Relation Age of Onset   Diabetes Mother    Hypertension Mother    Diabetes Maternal Uncle    Diabetes Brother    Stroke Maternal Grandmother    Leukemia Paternal Grandmother    Leukemia Paternal Grandfather     Social History   Tobacco Use   Smoking status: Never   Smokeless tobacco: Never  Vaping Use   Vaping Use: Never used  Substance Use Topics   Alcohol use: No    Alcohol/week: 0.0 standard drinks   Drug use: No    Home Medications Prior to Admission medications   Medication Sig Start Date End Date Taking? Authorizing Provider  acetaminophen (TYLENOL) 325 MG tablet Take 2 tablets (650 mg total) by mouth every 6 (six) hours as needed for mild pain, fever or headache. Patient not taking: Reported on 12/12/2020 11/28/20   Angiulli, Lavon Paganini, PA-C  diclofenac Sodium (VOLTAREN) 1 % GEL Apply 2 g topically 4 (four) times daily. 11/28/20  Angiulli, Lavon Paganini, PA-C  gabapentin (NEURONTIN) 300 MG capsule Take 1 capsule (300 mg total) by mouth 3 (three) times daily. 11/28/20   Angiulli, Lavon Paganini, PA-C  hydrOXYzine (ATARAX/VISTARIL) 25 MG tablet Take 1 tablet (25 mg total) by mouth 2 (two) times daily as needed for anxiety. 11/28/20   Angiulli, Lavon Paganini, PA-C  methocarbamol (ROBAXIN) 750 MG tablet Take 1 tablet (750 mg total) by mouth every 6 (six) hours as needed for muscle spasms (if zanaflex doesn't work). Patient not taking: Reported on 12/12/2020 11/28/20   Cathlyn Parsons, PA-C  Multiple Vitamin (MULTIVITAMIN WITH MINERALS) TABS tablet Take 1 tablet by mouth daily. Patient not taking: Reported on 12/12/2020 11/12/20   Pokhrel, Corrie Mckusick, MD  ondansetron (ZOFRAN ODT) 4 MG  disintegrating tablet Take 1 tablet (4 mg total) by mouth every 8 (eight) hours as needed for nausea or vomiting. Patient not taking: Reported on 12/12/2020 12/08/20   Darliss Cheney, MD  oxyCODONE (OXYCONTIN) 10 mg 12 hr tablet Take 1 tablet (10 mg total) by mouth every 12 (twelve) hours. 11/29/20   Angiulli, Lavon Paganini, PA-C  oxyCODONE-acetaminophen (PERCOCET/ROXICET) 5-325 MG tablet Take 1-2 tablets by mouth every 4 (four) hours as needed for severe pain or moderate pain. 12/12/20   Raulkar, Clide Deutscher, MD  propranolol (INDERAL) 10 MG tablet Take 1 tablet (10 mg total) by mouth daily. 11/29/20   Angiulli, Lavon Paganini, PA-C  pyridOXINE (B-6) 50 MG tablet Take 1 tablet (50 mg total) by mouth daily. 11/29/20   Angiulli, Lavon Paganini, PA-C  senna-docusate (SENOKOT-S) 8.6-50 MG tablet Take 1 tablet by mouth 2 (two) times daily. Patient not taking: No sig reported 11/11/20   Pokhrel, Corrie Mckusick, MD  tiZANidine (ZANAFLEX) 2 MG tablet Take 1 tablet (2 mg total) by mouth 3 (three) times daily. 11/28/20   Angiulli, Lavon Paganini, PA-C  vancomycin IVPB Inject 1,750 mg into the vein every 12 (twelve) hours for 24 days. Indication:  MRSA endocarditis- new septic arthritis First Dose: Yes Last Day of Therapy:  12/31/20 Labs - Sunday/Monday:  CBC/D, BMP, and vancomycin trough. Labs - Thursday:  BMP and vancomycin trough Labs - Every other week:  ESR and CRP Method of administration:Elastomeric Method of administration may be changed at the discretion of the patient and/or caregiver's ability to self-administer the medication ordered. 12/07/20 12/31/20  Darliss Cheney, MD    Allergies    Patient has no known allergies.  Review of Systems   Review of Systems  Constitutional:  Negative for fever.  HENT:  Negative for congestion.   Respiratory:  Negative for shortness of breath.   Cardiovascular:  Negative for chest pain.  Gastrointestinal:  Negative for abdominal distention.  Neurological:  Negative for dizziness and headaches.    Physical Exam Updated Vital Signs BP 117/74 (BP Location: Left Arm)   Pulse 73   Temp 98.5 F (36.9 C) (Oral)   Resp 18   SpO2 100%   Physical Exam Vitals and nursing note reviewed.  Constitutional:      General: She is not in acute distress.    Appearance: Normal appearance. She is not ill-appearing.  HENT:     Head: Normocephalic and atraumatic.  Eyes:     General: No scleral icterus.       Right eye: No discharge.        Left eye: No discharge.     Conjunctiva/sclera: Conjunctivae normal.  Pulmonary:     Effort: Pulmonary effort is normal.     Breath sounds:  No stridor.  Skin:    General: Skin is warm and dry.     Comments: PICC line in right upper extremity  Neurological:     Mental Status: She is alert and oriented to person, place, and time. Mental status is at baseline.    ED Results / Procedures / Treatments   Labs (all labs ordered are listed, but only abnormal results are displayed) Labs Reviewed - No data to display  EKG None  Radiology No results found.  Procedures Procedures   Medications Ordered in ED Medications - No data to display  ED Course  I have reviewed the triage vital signs and the nursing notes.  Pertinent labs & imaging results that were available during my care of the patient were reviewed by me and considered in my medical decision making (see chart for details).    MDM Rules/Calculators/A&P                           Patient with PICC line in right upper extremity has not been drawing back blood.  Discussed with IV team who power flushed and was able to draw back blood without difficulty.  Patient is distally neurovascularly intact after this.  She denies any issues in addition to these resolved issues now.  Will discharge home with return precautions.  Final Clinical Impression(s) / ED Diagnoses Final diagnoses:  Occlusion of peripherally inserted central catheter (PICC) line, initial encounter [facility])    Rx / [place]  Orders ED Discharge Orders     None        Tedd Sias, [place] 36/46/80 3212    Lianne Cure, DO 24/82/50 1953

## 2020-12-18 NOTE — ED Provider Notes (Signed)
Emergency Medicine Provider Triage Evaluation Note  Chelsea Mathis , a 30 y.o. female  was evaluated in triage.  Pt complains of PICC line in right upper extremity not drawing blood she states that she is on vancomycin for endocarditis.  Has not had issues administering her medications.  Review of Systems  Positive: PICC line issue Negative: Fever  Physical Exam  There were no vitals taken for this visit. Gen:   Awake, no distress   Resp:  Normal effort  MSK:   Moves extremities without difficulty  Other:  PICC line in right upper extremity  Medical Decision Making  Medically screening exam initiated at 6:59 PM.  Appropriate orders placed.  Sabria Minkoff was informed that the remainder of the evaluation will be completed by another provider, this initial triage assessment does not replace that evaluation, and the importance of remaining in the ED until their evaluation is complete.  Consult placed with IV team   Gailen Shelter, PA 12/18/20 1902    Franne Forts, DO 12/20/20 Rushie Goltz

## 2020-12-18 NOTE — ED Triage Notes (Signed)
Pt states the nurse was unable to draw blood from PICC line on Thursday.  She receives IV antibiotics twice per day and hasn't had any issues with the antibiotics infusing.

## 2020-12-18 NOTE — Discharge Instructions (Addendum)
Please follow-up with your primary care provider. Please continue taking medications as prescribed.  Return to the ER for any new or concerning symptoms.

## 2020-12-20 ENCOUNTER — Telehealth: Payer: Self-pay | Admitting: Pharmacist

## 2020-12-20 ENCOUNTER — Telehealth: Payer: Self-pay

## 2020-12-20 NOTE — Telephone Encounter (Signed)
Mad[place] form [facility] called to report critical labs that were drawn 12/19/2020.   CRITICAL VALUE STICKER  CRITICAL VALUE: Vanc trough 71.3  CRITICAL VALUE: Creatinine 1.61  RECEIVER (on-site recipient of call):Nile Dorning B, RN  DATE & TIME NOTIFIED: 12/20/20 @ 1420  MESSENGER (representative from lab): [place]  PROVIDER NOTIFIED: Margarite Gouge, [facility]  TIME OF NOTIFICATION:12/20/20 @ 1422  RESPONSE: Marchelle Folks to discuss with dosing pharmacy.    Sandie Ano, RN

## 2020-12-20 NOTE — Telephone Encounter (Signed)
See new telephone note from me - thank you!

## 2020-12-20 NOTE — Telephone Encounter (Signed)
Daptomycin is ok if we have difficulty with vanc troughs. Let's see what repeat trough and bmp looks like.

## 2020-12-20 NOTE — Telephone Encounter (Signed)
Sounds good - will relay repeat labs to you when they result. Thanks!

## 2020-12-20 NOTE — Telephone Encounter (Signed)
Home health nurse called critical lab values for patient: vancomycin trough 71.3 and Scr 1.61 from 9/19. Previous Scr has been stable ~0.3-0.4. Patient's home health services are through [place]. Provided verbal order to hold vancomycin at this time until further notice. Also provided verbal orders to repeat vancomycin trough and BMP on Friday, 9/23. Orders relayed to clinical coordinator Daphene Jaeger.   Will look into daptomycin coverage for patient. Dr. Elinor Parkinson, is daptomycin an appropriate alternative in your opinion if her vancomycin remains difficult to manage? She does not complete therapy until 12/31/20. Please advise.   Thank you!  Margarite Gouge, PharmD, CPP Clinical Pharmacist Practitioner Infectious Diseases Clinical Pharmacist [facility]

## 2020-12-22 ENCOUNTER — Telehealth: Payer: Self-pay | Admitting: Pharmacist

## 2020-12-22 NOTE — Telephone Encounter (Signed)
Chelsea Mathis from [facility] called asking about dressing change schedule. Patient was scheduled to get dressing change today but has verbal orders for repeat labs tomorrow. Confirmed it was ok for patient to have her dressing changed tomorrow along with her repeat labs.  Margarite Gouge, PharmD, CPP Clinical Pharmacist Practitioner Infectious Diseases Clinical Pharmacist [facility] for Infectious Disease

## 2020-12-23 DIAGNOSIS — I330 Acute and subacute infective endocarditis: Secondary | ICD-10-CM

## 2020-12-23 DIAGNOSIS — R7881 Bacteremia: Secondary | ICD-10-CM

## 2020-12-23 DIAGNOSIS — G894 Chronic pain syndrome: Secondary | ICD-10-CM

## 2020-12-23 DIAGNOSIS — M00052 Staphylococcal arthritis, left hip: Secondary | ICD-10-CM

## 2020-12-23 DIAGNOSIS — I2699 Other pulmonary embolism without acute cor pulmonale: Secondary | ICD-10-CM

## 2020-12-23 DIAGNOSIS — M60009 Infective myositis, unspecified site: Secondary | ICD-10-CM

## 2020-12-23 DIAGNOSIS — M4658 Other infective spondylopathies, sacral and sacrococcygeal region: Secondary | ICD-10-CM

## 2020-12-23 DIAGNOSIS — E1165 Type 2 diabetes mellitus with hyperglycemia: Secondary | ICD-10-CM

## 2020-12-23 DIAGNOSIS — B9562 Methicillin resistant Staphylococcus aureus infection as the cause of diseases classified elsewhere: Secondary | ICD-10-CM

## 2020-12-23 DIAGNOSIS — Z9181 History of falling: Secondary | ICD-10-CM

## 2020-12-23 DIAGNOSIS — Z792 Long term (current) use of antibiotics: Secondary | ICD-10-CM

## 2020-12-26 ENCOUNTER — Encounter: Payer: Medicaid Other | Admitting: Physical Medicine and Rehabilitation

## 2020-12-26 ENCOUNTER — Telehealth: Payer: Self-pay

## 2020-12-26 NOTE — Telephone Encounter (Signed)
Patient is hospitalized at [facility]. Spoke with hospital pharmacist. They are pulse dosing her vancomycin right now. Vanc level on 9/25 was 15.6 and 9/26 was 18.1. Verified planned stop date of 10/1 with pharmacist. If patient is still hospitalized, they will discontinue on 10/1.

## 2020-12-26 NOTE — Telephone Encounter (Signed)
Received voicemail from [place] at [facility] requesting callback for critical lab value at 10:33. Voicemail retrieved at 10:55.  RN called Anastasia Fiedler and received critical labs of Vanc trough 28.4 and creatinine 1.6 at 10:57. [place] states she has made Cassie with pharmacy aware. RN confirmed with Aggie Cosier, RPH that she received lab values.   Sandie Ano, RN

## 2020-12-28 ENCOUNTER — Ambulatory Visit: Payer: Medicaid Other | Admitting: Infectious Diseases

## 2020-12-28 ENCOUNTER — Inpatient Hospital Stay: Payer: Medicaid Other | Admitting: Family Medicine

## 2021-01-04 ENCOUNTER — Inpatient Hospital Stay: Payer: Medicaid Other | Admitting: Registered Nurse

## 2021-01-04 ENCOUNTER — Encounter: Payer: Medicaid Other | Attending: Family Medicine | Admitting: Registered"

## 2021-01-06 ENCOUNTER — Inpatient Hospital Stay: Payer: Medicaid Other | Admitting: Infectious Disease

## 2021-01-13 ENCOUNTER — Telehealth: Payer: Self-pay

## 2021-01-13 NOTE — Telephone Encounter (Signed)
Marylene Land from [facility] called and stated the patient delayed resumption of care until next week.There will be a delay in starting home health

## 2021-01-20 ENCOUNTER — Telehealth: Payer: Self-pay

## 2021-01-20 ENCOUNTER — Encounter
Payer: Medicaid Other | Attending: Physical Medicine and Rehabilitation | Admitting: Physical Medicine and Rehabilitation

## 2021-01-20 NOTE — Telephone Encounter (Signed)
Orders approved and given. 

## 2021-01-20 NOTE — Telephone Encounter (Signed)
Chelsea Mathis, PT from [facility] called requesting HHPT 1wk1, 2wk2. Patient did not show for hospital f/u today. Are you still going to sign off on these orders? Please advise.

## 2021-02-09 ENCOUNTER — Emergency Department (HOSPITAL_COMMUNITY): Payer: Medicaid Other

## 2021-02-09 ENCOUNTER — Emergency Department (HOSPITAL_COMMUNITY)
Admission: EM | Admit: 2021-02-09 | Discharge: 2021-02-09 | Disposition: A | Discharge status: Home / Self Care | Payer: Medicaid Other | Attending: Emergency Medicine | Admitting: Emergency Medicine

## 2021-02-09 DIAGNOSIS — E1110 Type 2 diabetes mellitus with ketoacidosis without coma: Secondary | ICD-10-CM | POA: Insufficient documentation

## 2021-02-09 DIAGNOSIS — M25552 Pain in left hip: Secondary | ICD-10-CM | POA: Insufficient documentation

## 2021-02-09 DIAGNOSIS — M25559 Pain in unspecified hip: Secondary | ICD-10-CM

## 2021-02-09 LAB — CBC WITH DIFFERENTIAL/PLATELET
Abs Immature Granulocytes: 0.02 10*3/uL (ref 0.00–0.07)
Basophils Absolute: 0 10*3/uL (ref 0.0–0.1)
Basophils Relative: 0 %
Eosinophils Absolute: 0.1 10*3/uL (ref 0.0–0.5)
Eosinophils Relative: 1 %
HCT: 34.4 % — ABNORMAL LOW (ref 36.0–46.0)
Hemoglobin: 10.4 g/dL — ABNORMAL LOW (ref 12.0–15.0)
Immature Granulocytes: 0 %
Lymphocytes Relative: 44 %
Lymphs Abs: 3.2 10*3/uL (ref 0.7–4.0)
MCH: 24.7 pg — ABNORMAL LOW (ref 26.0–34.0)
MCHC: 30.2 g/dL (ref 30.0–36.0)
MCV: 81.7 fL (ref 80.0–100.0)
Monocytes Absolute: 0.5 10*3/uL (ref 0.1–1.0)
Monocytes Relative: 6 %
Neutro Abs: 3.6 10*3/uL (ref 1.7–7.7)
Neutrophils Relative %: 49 %
Platelets: 409 10*3/uL — ABNORMAL HIGH (ref 150–400)
RBC: 4.21 MIL/uL (ref 3.87–5.11)
RDW: 16.9 % — ABNORMAL HIGH (ref 11.5–15.5)
WBC: 7.3 10*3/uL (ref 4.0–10.5)
nRBC: 0 % (ref 0.0–0.2)

## 2021-02-09 LAB — BASIC METABOLIC PANEL
Anion gap: 8 (ref 5–15)
BUN: 9 mg/dL (ref 6–20)
CO2: 23 mmol/L (ref 22–32)
Calcium: 9.5 mg/dL (ref 8.9–10.3)
Chloride: 107 mmol/L (ref 98–111)
Creatinine, Ser: 0.91 mg/dL (ref 0.44–1.00)
GFR, Estimated: >60 mL/min (ref >60)
Glucose, Bld: 79 mg/dL (ref 70–99)
Potassium: 3.3 mmol/L — ABNORMAL LOW (ref 3.5–5.1)
Sodium: 138 mmol/L (ref 135–145)

## 2021-02-09 LAB — C-REACTIVE PROTEIN: CRP: 0.6 mg/dL (ref <1.0)

## 2021-02-09 LAB — I-STAT BETA HCG BLOOD, ED (MC, WL, AP ONLY): I-stat hCG, quantitative: <5 m[IU]/mL (ref <5)

## 2021-02-09 LAB — SEDIMENTATION RATE: Sed Rate: 40 mm/hr — ABNORMAL HIGH (ref 0–22)

## 2021-02-09 IMAGING — DX DG HIP (WITH OR WITHOUT PELVIS) 2-3V*R*
3 series · 3 of 3 positions shown · non-contrast
Comparison: [DATE].

CLINICAL DATA: Bilateral hip pain without known injury.

EXAM:
DG HIP (WITH OR WITHOUT PELVIS) 2-3V RIGHT

[t pelvis ap]
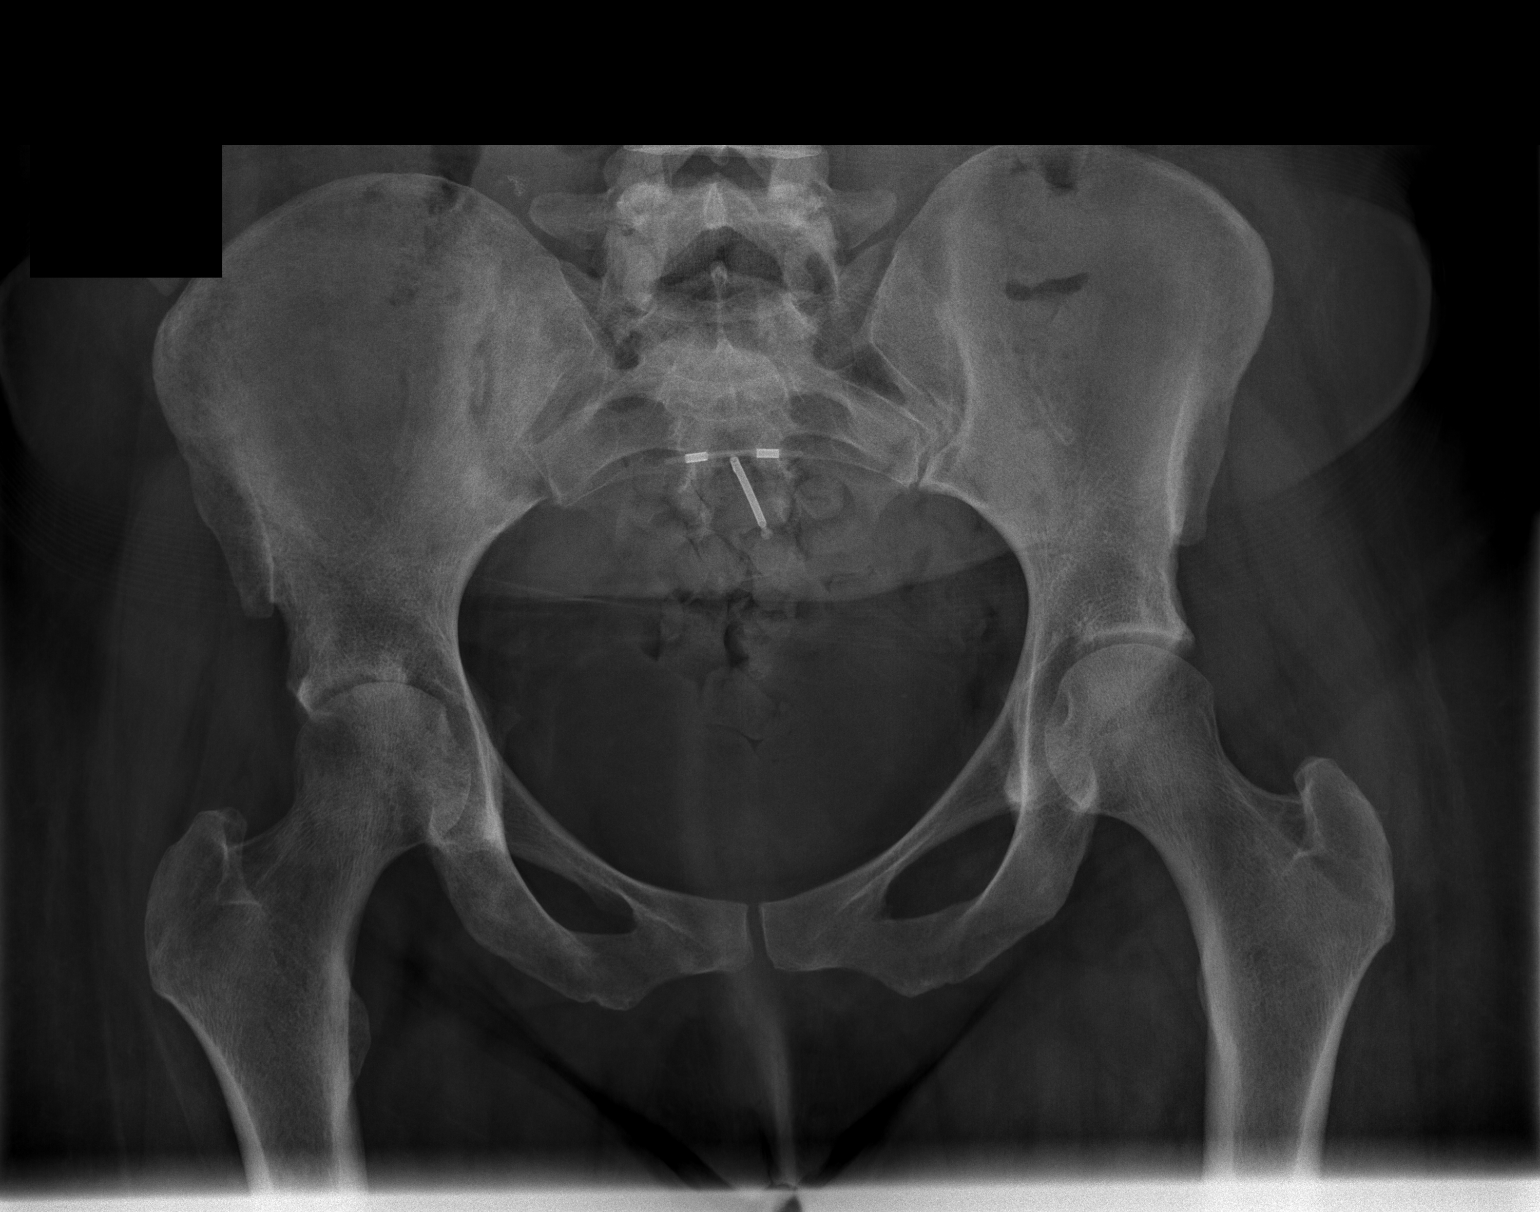

[t hip ap right]
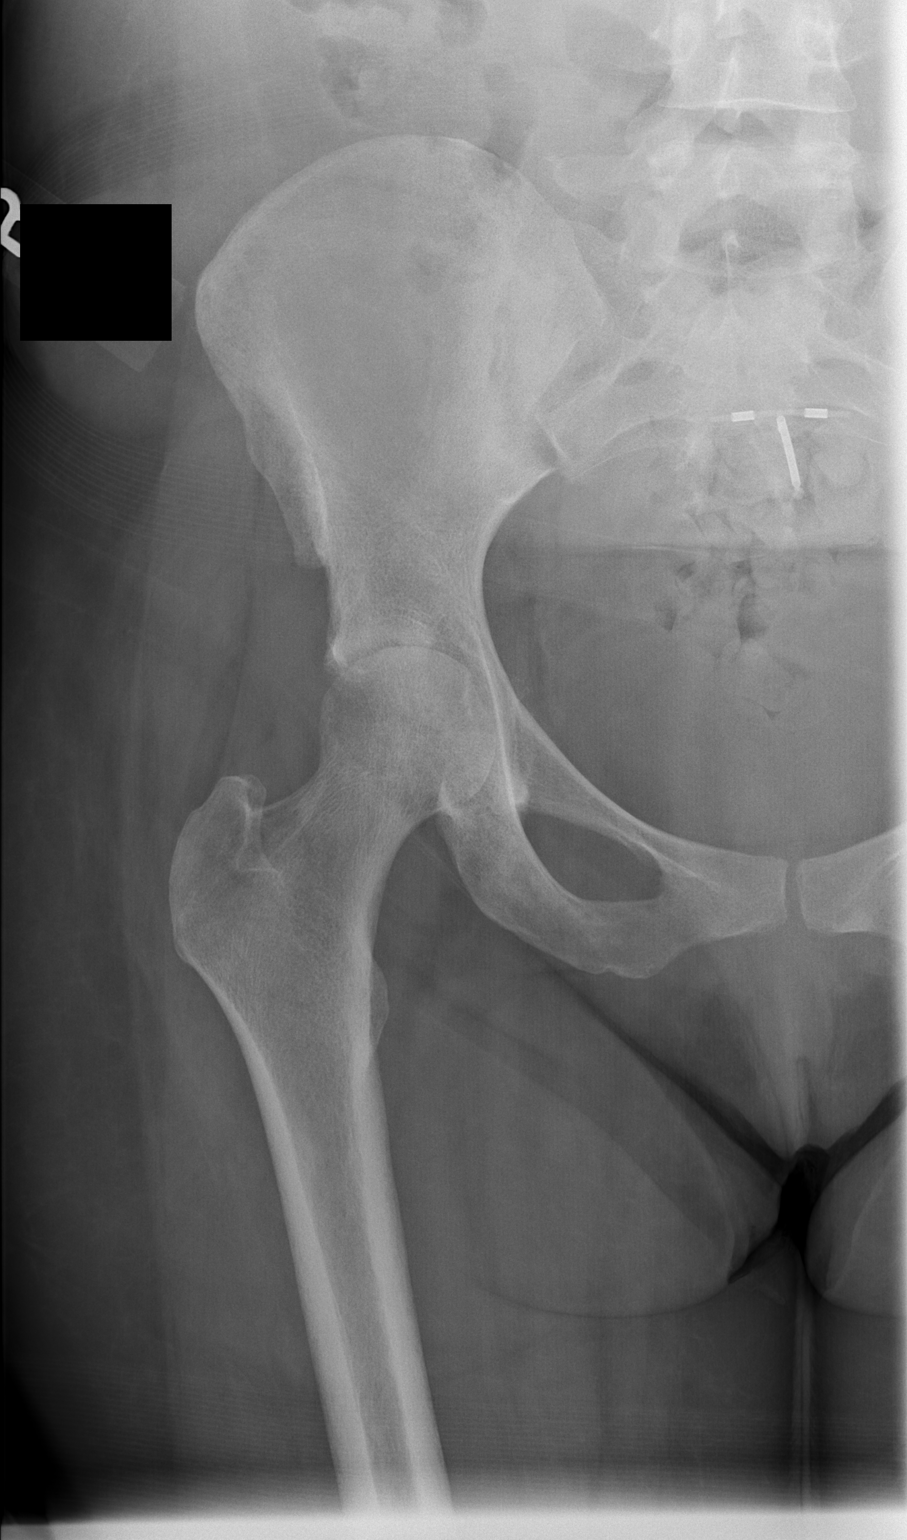

[t hip frog leg right]
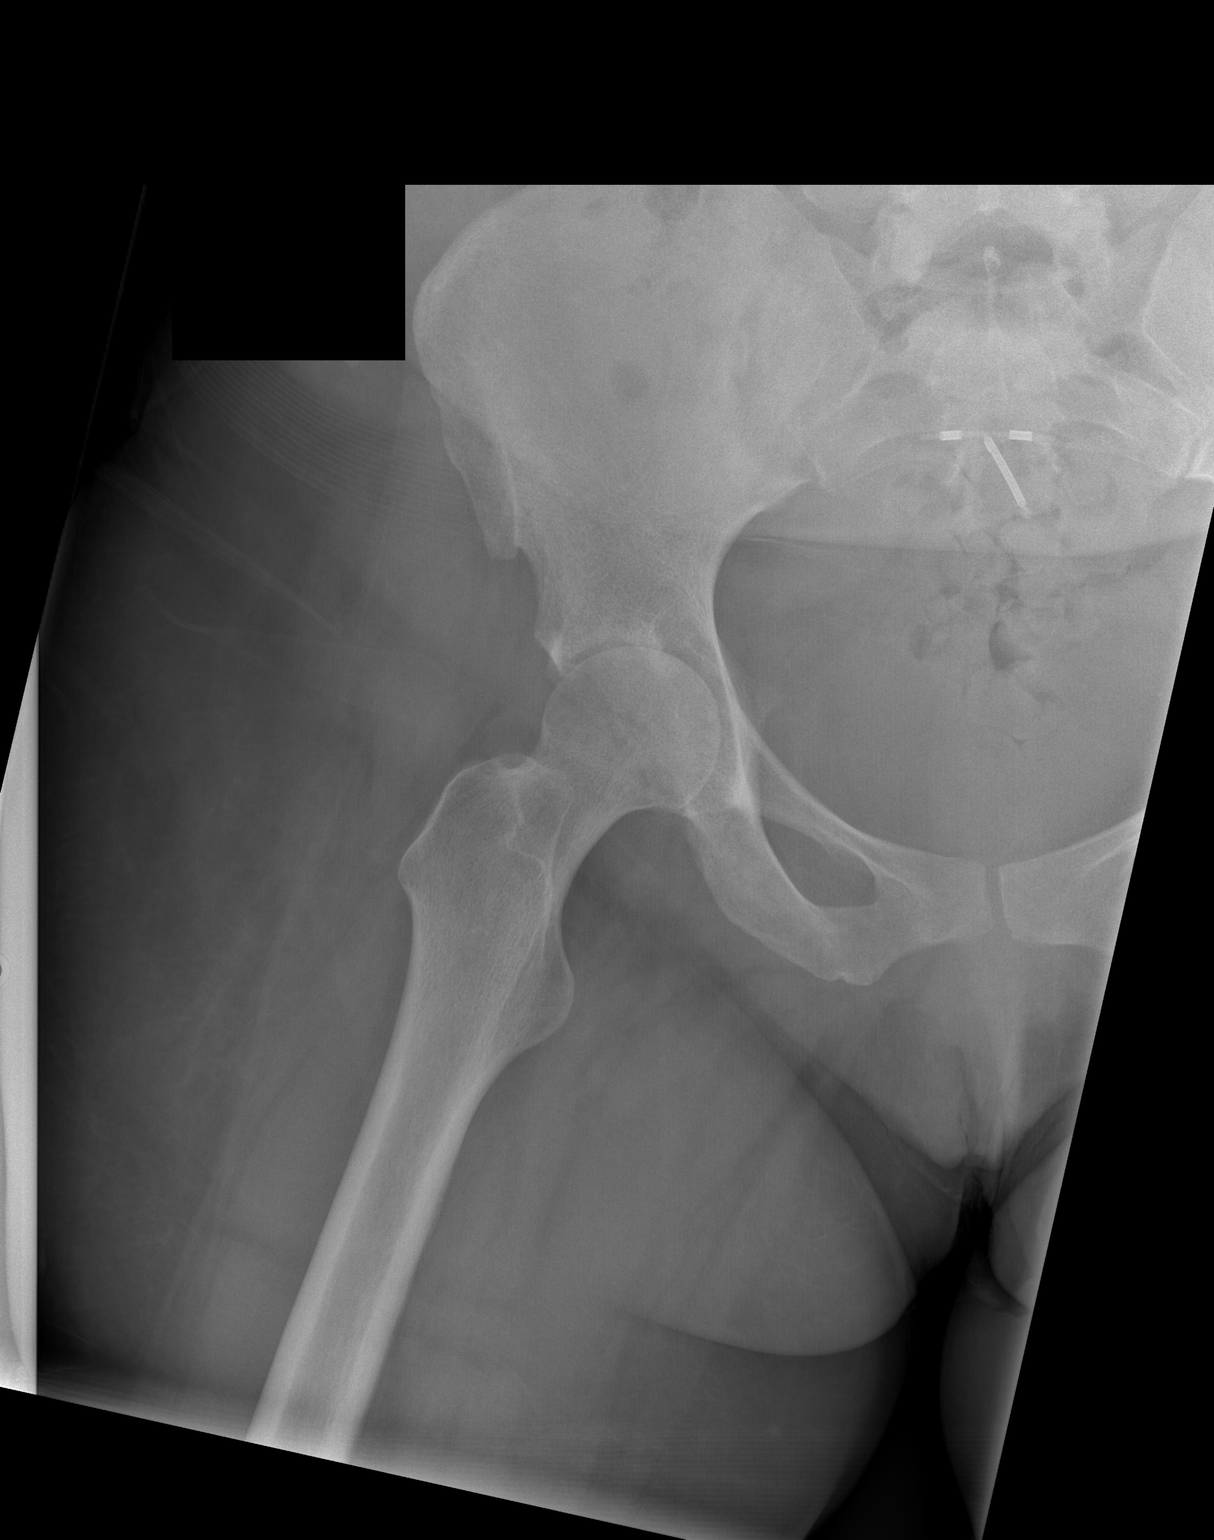

[3 of 3 positions shown; findings below may reference images not displayed]

FINDINGS: There is no evidence of hip fracture or dislocation. There is no
evidence of arthropathy or other focal bone abnormality.
IMPRESSION: Negative.

## 2021-02-09 IMAGING — DX DG HIP (WITH OR WITHOUT PELVIS) 2-3V*L*
2 series · 2 of 2 positions shown · non-contrast
Comparison: None.

CLINICAL DATA: Lateral hip pain without known injury.

EXAM:
DG HIP (WITH OR WITHOUT PELVIS) 2-3V LEFT

[t hip ap left]
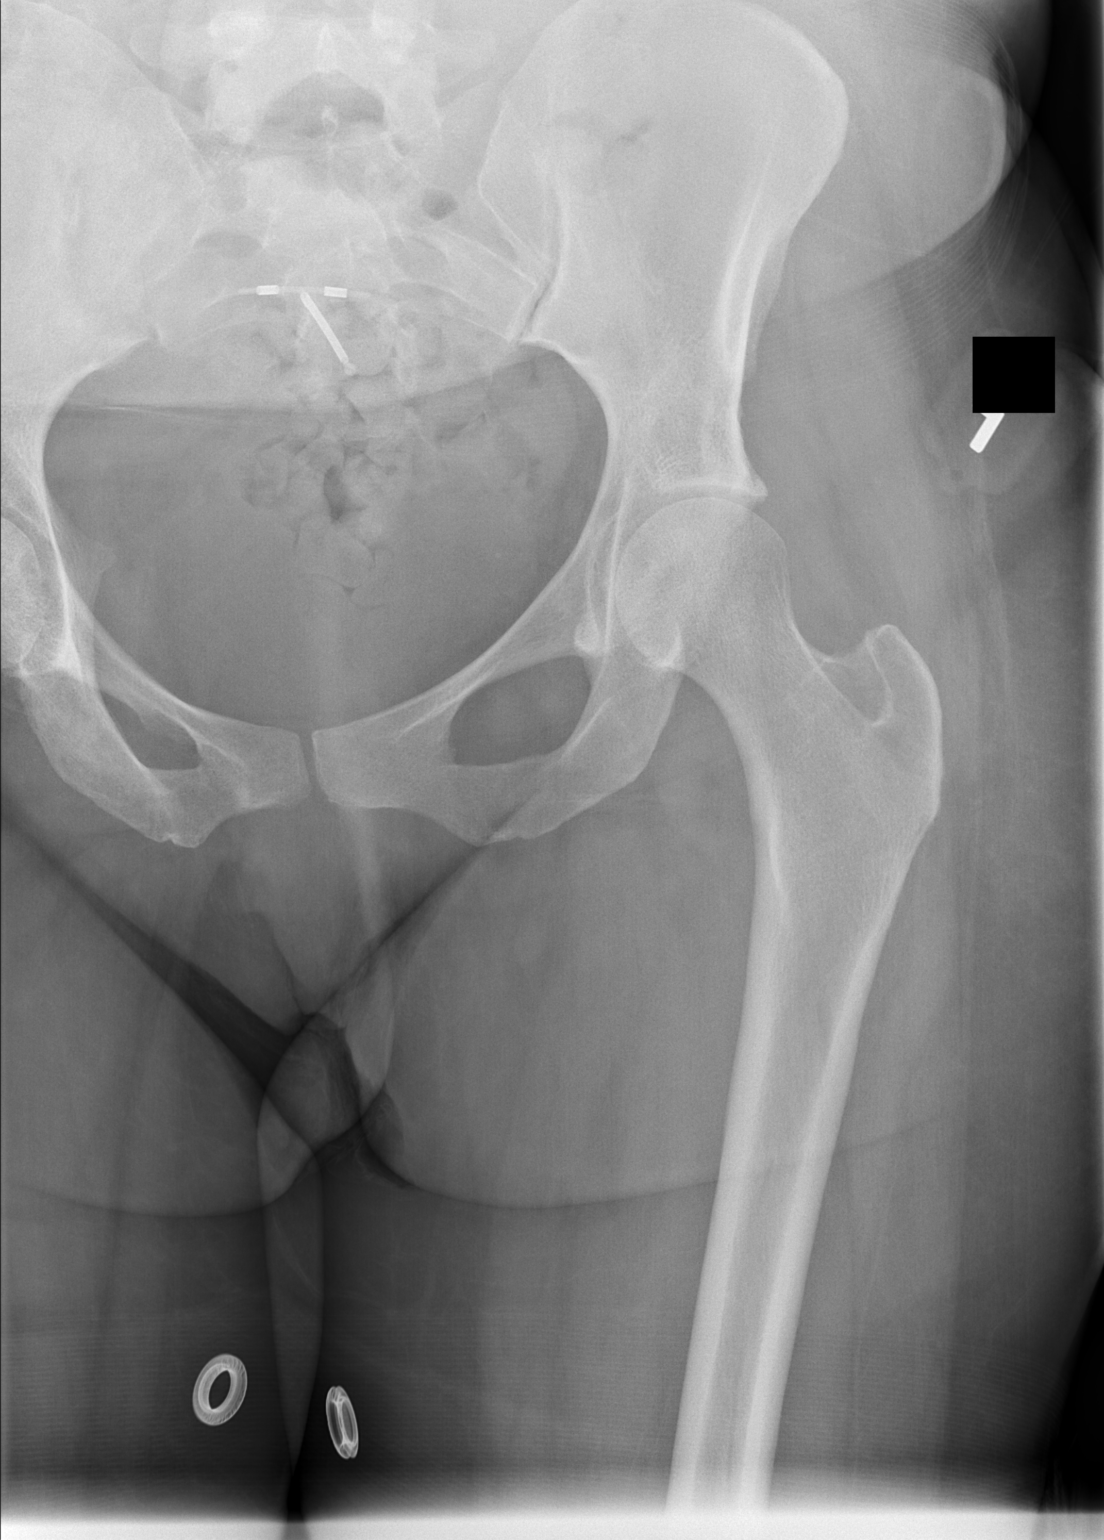

[t hip frog leg left]
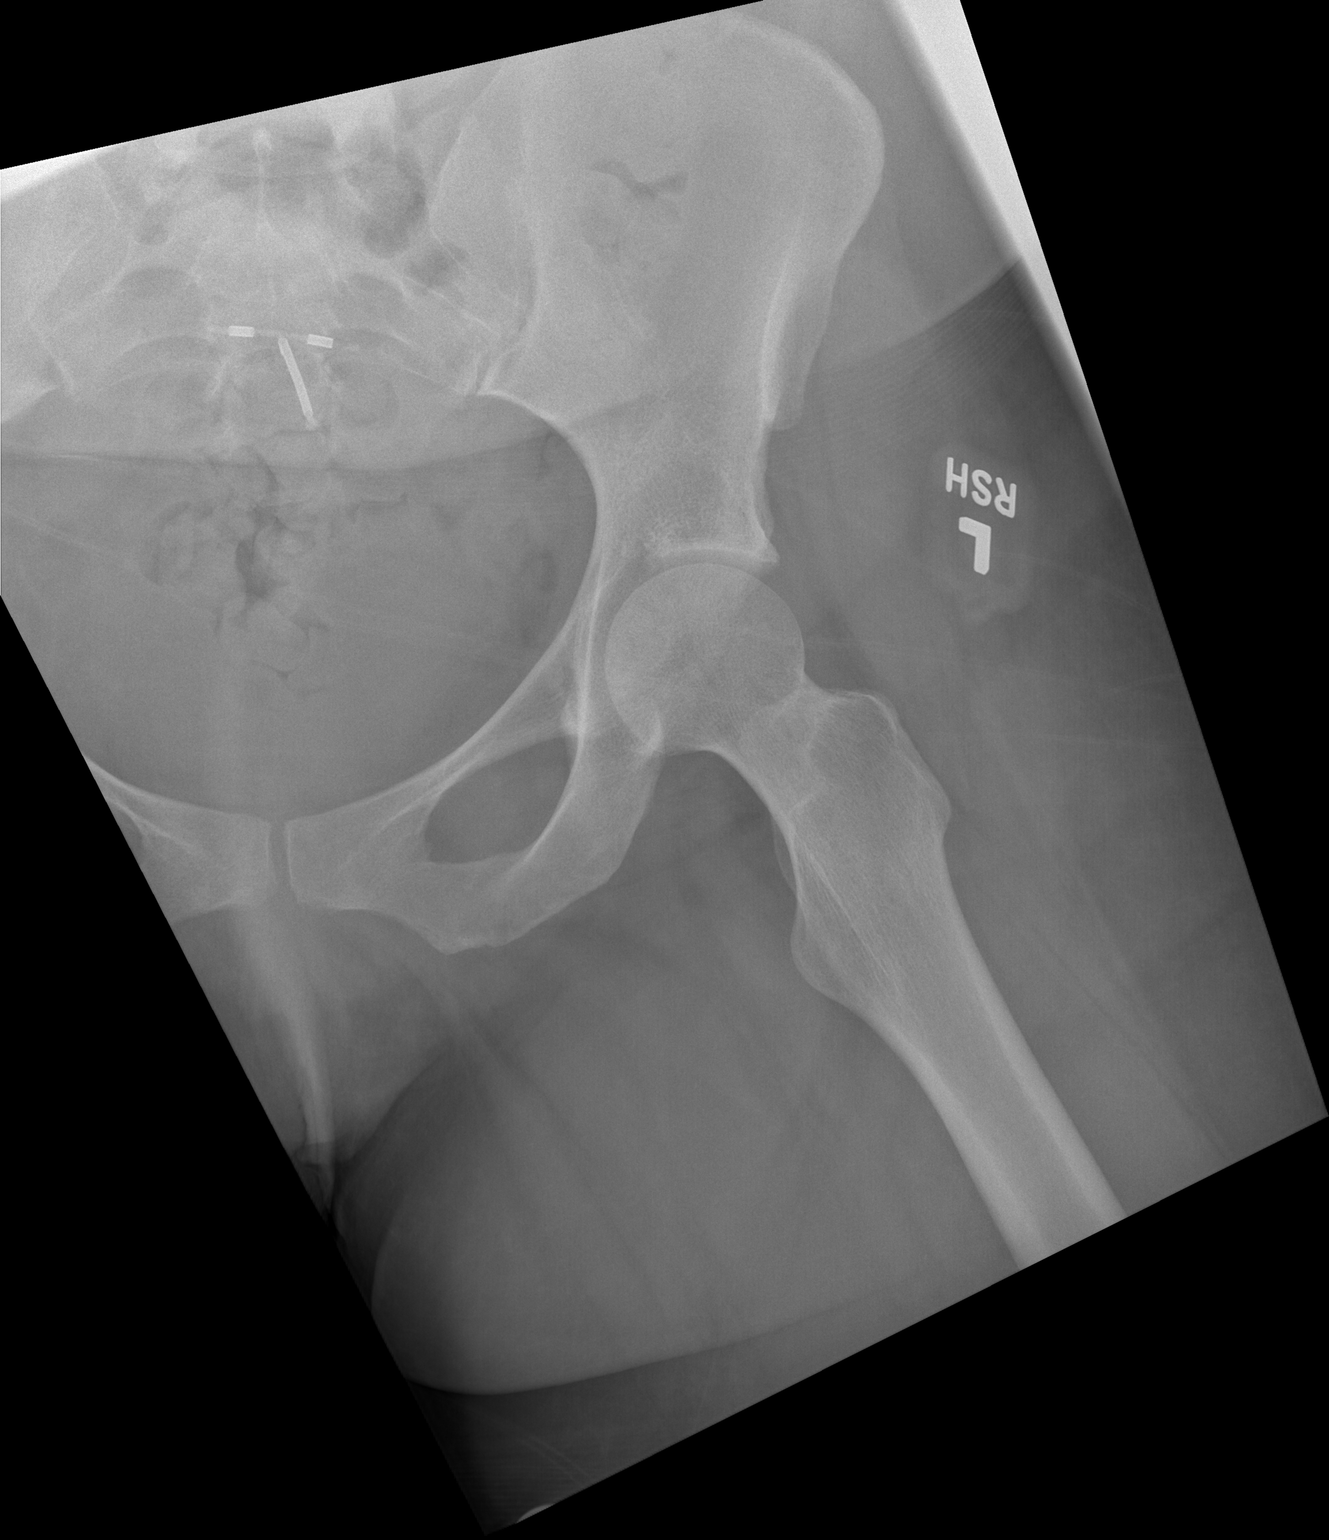

[2 of 2 positions shown; findings below may reference images not displayed]

FINDINGS: There is no evidence of hip fracture or dislocation. There is no
evidence of arthropathy or other focal bone abnormality.
IMPRESSION: Negative.

## 2021-02-09 MED ORDER — NAPROXEN 250 MG PO TABS
500.0000 mg | ORAL_TABLET | Freq: Once | ORAL | Status: AC
  Administered 2021-02-09: 500 mg via ORAL
  Filled 2021-02-09: qty 2

## 2021-02-09 MED ORDER — NAPROXEN 500 MG PO TABS
500.0000 mg | ORAL_TABLET | Freq: Two times a day (BID) | ORAL | 0 refills | Status: AC
Start: 2021-02-09 — End: ?

## 2021-02-09 MED ORDER — HYDROCODONE-ACETAMINOPHEN 5-325 MG PO TABS
1.0000 | ORAL_TABLET | Freq: Four times a day (QID) | ORAL | 0 refills | Status: AC | PRN
Start: 2021-02-09 — End: 2021-02-14

## 2021-02-09 NOTE — ED Notes (Signed)
Patient Alert and oriented to baseline. Stable and ambulatory to baseline. Patient verbalized understanding of the discharge instructions.  Patient belongings were taken by the patient.   

## 2021-02-09 NOTE — Discharge Instructions (Addendum)
You were seen today for evaluation of your new left hip pain.  I suspect that he may have some bursitis of the joint, however we did a very extensive work-up given your recent history of septic arthritis of the opposite joint.  Your lab work did not show any signs of infection or inflammation.  One of your inflammatory markers was slightly elevated however we think that this is actually on a downtrend from when it was last checked 2 months ago.  I believe your hip needs some very close follow-up with your orthopedic doctor.  At this point we discussed that it may be something very benign such as bursitis which you can use an anti-inflammatory such as ibuprofen or naproxen for relief. However this could be the beginning stages of another infection which is why you will require close follow-up with your orthopedic doctor in case any further evaluation or intervention is needed.  I sent you home with a prescription anti-inflammatory to help with your pain.  If you develop any alarming symptoms such as developing fever, swelling/warmth around the hip or vomiting please return to the ED for further evaluation.  In regards to the pain med: Do not mix this medication with alcohol or other sedating medications. Do not drive or do heavy physical activity until you know how this medication affects you.  It may cause drowsiness.

## 2021-02-09 NOTE — ED Provider Notes (Signed)
[facility] EMERGENCY DEPARTMENT Provider Note   CSN: [id number] Arrival date & time: 02/09/21  2992     History Chief Complaint  Patient presents with   Hip Pain    Chelsea Mathis is a 30 y.o. female for new onset left hip pain.  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  She first presented to the ED in July for symptoms of right hip pain with no clear etiology. Ultimately patient has had extensive admissions over the last few months involving treatment for septic arthritis. Ultimately needed surgical drainage of the infected joint. Patient states her current pain feels similar to the beginning of the pain she felt when she had her septic joint.  Patient complains of pain when walking and when lying on the affected side.  Denies numbness, tingling, fever, chills, nausea and vomiting.   Hip Pain Pertinent negatives include no abdominal pain, no headaches and no shortness of breath.      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   Chronic pain syndrome 12/03/2020   Labile blood glucose    Acute blood loss anemia    Leukocytosis    Hyponatremia    Acute bacterial endocarditis    Pain of right thigh    Adjustment disorder with mixed anxiety and depressed mood 11/10/2020   Myositis 11/04/2020   Myofasciitis 11/04/2020   Bacteremia due to Staphylococcus aureus 11/02/2020   Septic pulmonary embolism ([place]) 11/02/2020   DM2 (diabetes mellitus, type 2) ([place]) 11/02/2020   Sepsis ([place]) 11/02/2020   DKA (diabetic ketoacidosis) ([place]) 10/31/2020   Acute appendicitis 11/10/2016   Postpartum care following vaginal delivery 05/13/2015   Gestational diabetes mellitus (GDM) affecting pregnancy 05/13/2015    Past Surgical History:   Procedure Laterality Date   LAPAROSCOPIC APPENDECTOMY N/A 11/11/2016   Procedure: APPENDECTOMY LAPAROSCOPIC;  Surgeon: Georganna Skeans, MD;  Location: [facility];  Service: General;  Laterality: N/A;   TEE WITHOUT CARDIOVERSION N/A 11/03/2020   Procedure: TRANSESOPHAGEAL ECHOCARDIOGRAM (TEE);  Surgeon: Buford Dresser, MD;  Location: [facility] ENDOSCOPY;  Service: Cardiovascular;  Laterality: N/A;     OB History     Gravida  3   Para  2   Term  2   Preterm      AB  1   Living  2      SAB      IAB      Ectopic      Multiple  0   Live Births  2           Family History  Problem Relation Age of Onset   Diabetes Mother    Hypertension Mother    Diabetes Maternal Uncle    Diabetes Brother    Stroke Maternal Grandmother    Leukemia Paternal Grandmother    Leukemia Paternal Grandfather     Social History   Tobacco Use   Smoking status: Never   Smokeless tobacco: Never  Vaping Use   Vaping Use: Never used  Substance Use Topics   Alcohol use: No    Alcohol/week: 0.0 standard drinks   Drug use: No    Home Medications Prior to Admission medications   Medication Sig Start Date End Date Taking? Authorizing Provider  acetaminophen (TYLENOL) 325  MG tablet Take 2 tablets (650 mg total) by mouth every 6 (six) hours as needed for mild pain, fever or headache. Patient not taking: Reported on 12/12/2020 11/28/20   Angiulli, Lavon Paganini, PA-C  diclofenac Sodium (VOLTAREN) 1 % GEL Apply 2 g topically 4 (four) times daily. 11/28/20   Angiulli, Lavon Paganini, PA-C  gabapentin (NEURONTIN) 300 MG capsule Take 1 capsule (300 mg total) by mouth 3 (three) times daily. 11/28/20   Angiulli, Lavon Paganini, PA-C  hydrOXYzine (ATARAX/VISTARIL) 25 MG tablet Take 1 tablet (25 mg total) by mouth 2 (two) times daily as needed for anxiety. 11/28/20   Angiulli, Lavon Paganini, PA-C  methocarbamol (ROBAXIN) 750 MG tablet Take 1 tablet (750 mg total) by mouth every 6 (six) hours as needed for muscle spasms (if  zanaflex doesn't work). Patient not taking: Reported on 12/12/2020 11/28/20   Cathlyn Parsons, PA-C  Multiple Vitamin (MULTIVITAMIN WITH MINERALS) TABS tablet Take 1 tablet by mouth daily. Patient not taking: Reported on 12/12/2020 11/12/20   Pokhrel, Corrie Mckusick, MD  ondansetron (ZOFRAN ODT) 4 MG disintegrating tablet Take 1 tablet (4 mg total) by mouth every 8 (eight) hours as needed for nausea or vomiting. Patient not taking: Reported on 12/12/2020 12/08/20   Darliss Cheney, MD  oxyCODONE (OXYCONTIN) 10 mg 12 hr tablet Take 1 tablet (10 mg total) by mouth every 12 (twelve) hours. 11/29/20   Angiulli, Lavon Paganini, PA-C  oxyCODONE-acetaminophen (PERCOCET/ROXICET) 5-325 MG tablet Take 1-2 tablets by mouth every 4 (four) hours as needed for severe pain or moderate pain. 12/12/20   Raulkar, Clide Deutscher, MD  propranolol (INDERAL) 10 MG tablet Take 1 tablet (10 mg total) by mouth daily. 11/29/20   Angiulli, Lavon Paganini, PA-C  pyridOXINE (B-6) 50 MG tablet Take 1 tablet (50 mg total) by mouth daily. 11/29/20   Angiulli, Lavon Paganini, PA-C  senna-docusate (SENOKOT-S) 8.6-50 MG tablet Take 1 tablet by mouth 2 (two) times daily. Patient not taking: No sig reported 11/11/20   Pokhrel, Corrie Mckusick, MD  tiZANidine (ZANAFLEX) 2 MG tablet Take 1 tablet (2 mg total) by mouth 3 (three) times daily. 11/28/20   Angiulli, Lavon Paganini, PA-C    Allergies    Patient has no known allergies.  Review of Systems   Review of Systems  Constitutional:  Negative for fever.  HENT: Negative.    Eyes: Negative.   Respiratory:  Negative for shortness of breath.   Cardiovascular: Negative.   Gastrointestinal:  Negative for abdominal pain and vomiting.  Endocrine: Negative.   Genitourinary: Negative.   Musculoskeletal:  Positive for arthralgias.  Skin:  Negative for rash.  Neurological:  Negative for headaches.  All other systems reviewed and are negative.  Physical Exam Updated Vital Signs BP (!) 134/98 (BP Location: Right Arm)   Pulse 87   Temp  97.8 F (36.6 C) (Oral)   Resp 18   LMP 01/21/2021   SpO2 100%   Physical Exam Vitals and nursing note reviewed.  Constitutional:      General: She is not in acute distress.    Appearance: She is not ill-appearing.  HENT:     Head: Atraumatic.  Eyes:     Conjunctiva/sclera: Conjunctivae normal.  Cardiovascular:     Rate and Rhythm: Normal rate and regular rhythm.     Pulses: Normal pulses.     Heart sounds: No murmur heard. Pulmonary:     Effort: Pulmonary effort is normal. No respiratory distress.     Breath sounds: Normal breath sounds.  Abdominal:     General: Abdomen is flat. There is no distension.     Palpations: Abdomen is soft.     Tenderness: There is no abdominal tenderness.  Musculoskeletal:        General: Normal range of motion.     Cervical back: Normal range of motion.       Legs:     Comments: Focal tenderness to palpation on the left femoral hip joint.  No effusion, no bruising.  No erythema upon palpation.  Right hip has well-healed scar with no drainage or signs of infection.  No tenderness to palpation on right side.  Sensation intact distally bilaterally  Skin:    General: Skin is warm and dry.     Capillary Refill: Capillary refill takes less than 2 seconds.  Neurological:     General: No focal deficit present.     Mental Status: She is alert.  Psychiatric:        Mood and Affect: Mood normal.    ED Results / Procedures / Treatments   Labs (all labs ordered are listed, but only abnormal results are displayed) Labs Reviewed  CBC WITH DIFFERENTIAL/PLATELET - Abnormal; Notable for the following components:      Result Value   Hemoglobin 10.4 (*)    HCT 34.4 (*)    MCH 24.7 (*)    RDW 16.9 (*)    Platelets 409 (*)    All other components within normal limits  BASIC METABOLIC PANEL - Abnormal; Notable for the following components:   Potassium 3.3 (*)    All other components within normal limits  C-REACTIVE PROTEIN  SEDIMENTATION RATE   I-STAT BETA HCG BLOOD, ED ([facility], [facility], [facility] ONLY)  CBG MONITORING, ED    EKG None  Radiology DG HIP UNILAT WITH PELVIS 2-3 VIEWS LEFT  Result Date: 02/09/2021 CLINICAL DATA:  Lateral hip pain without known injury. EXAM: DG HIP (WITH OR WITHOUT PELVIS) 2-3V LEFT COMPARISON:  None. FINDINGS: There is no evidence of hip fracture or dislocation. There is no evidence of arthropathy or other focal bone abnormality. IMPRESSION: Negative. Electronically Signed   By: Marijo Conception M.D.   On: 02/09/2021 10:53   DG HIP UNILAT WITH PELVIS 2-3 VIEWS RIGHT  Result Date: 02/09/2021 CLINICAL DATA:  Bilateral hip pain without known injury. EXAM: DG HIP (WITH OR WITHOUT PELVIS) 2-3V RIGHT COMPARISON:  December 03, 2020. FINDINGS: There is no evidence of hip fracture or dislocation. There is no evidence of arthropathy or other focal bone abnormality. IMPRESSION: Negative. Electronically Signed   By: Marijo Conception M.D.   On: 02/09/2021 10:55    Procedures Procedures   Medications Ordered in ED Medications  naproxen (NAPROSYN) tablet 500 mg (has no administration in time range)    ED Course  I have reviewed the triage vital signs and the nursing notes.  Pertinent labs & imaging results that were available during my care of the patient were reviewed by me and considered in my medical decision making (see chart for details).    MDM Rules/Calculators/A&P                         This is a 30 year old female who presents to the ED for evaluation of new onset left hip pain that started about 1 week ago. Patient has a history of septic arthritis in the opposing hip within the last few months.  She is on long-term doxycycline and has Ortho  care with [facility].  Patient had exquisite focal tenderness of the left hip joint on palpation.  However no signs of effusion, bruising, swelling or erythema.  X-rays of both hips were negative for fracture or obvious signs of inflammatory processes.  CBC was  nonconcerning for infection.  CRP was within normal limits.  BMP unremarkable.  Her ESR was slightly elevated at 40 however this is come down quite a bit since her last ESR taken 2 months ago.  Suspect that this is a downtrending value.  Given her reassuring work-up and normal vitals, do not believe that patient needs further imaging or admission at this time.  Suspect that she may have bursitis of the joint.  Discussed in depth with patient her results, and I have recommended that she have close follow-up with her Ortho clinic for more monitoring.  I went over alarm signs with her including developing fever, nausea, fever or increasing pain that would bring her back to the ED.  Discharged home with naproxen and Norco for pain management until her Ortho appointment. Final Clinical Impression(s) / ED Diagnoses Final diagnoses:  Hip pain    Rx / DC Orders ED Discharge Orders     None        Tonye Pearson, PA-C 02/09/21 1518    Lorelle Gibbs, DO 02/15/21 1411

## 2021-02-09 NOTE — ED Triage Notes (Signed)
Pt. Stated, both hips hurting for one week. 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

## 2021-02-09 NOTE — ED Provider Notes (Signed)
Emergency Medicine Provider Triage Evaluation Note  Chelsea Mathis , a 30 y.o. female  was evaluated in triage.  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  Patient reports last month she saw her surgeon at [facility] and was doing well however she has had return of her bilateral hip pain over the past 1 week.  No history of fall or injury she describes aching pain in both hips worsens with weightbearing improved with rest.  Review of Systems  Positive: Bilateral hip pain Negative: Fall, injury, fever, chills, nausea, vomiting, swelling/color change.  Physical Exam  BP (!) 148/107 (BP Location: Right Arm)   Pulse (!) 101   Temp 97.8 F (36.6 C) (Oral)   Resp 16   LMP 01/21/2021   SpO2 100%  Gen:   Awake, no distress   Resp:  Normal effort  MSK:   Moves extremities without difficulty  Abdomen:        Soft nontender. Other:  Negative logroll bilaterally.  Pedal pulses sensation intact to both feet.  Medical Decision Making  Medically screening exam initiated at 10:04 AM.  Appropriate orders placed.  Chelsea Mathis was informed that the remainder of the evaluation will be completed by another provider, this initial triage assessment does not replace that evaluation, and the importance of remaining in the ED until their evaluation is complete.   Note: Portions of this report may have been transcribed using voice recognition software. Every effort was made to ensure accuracy; however, inadvertent computerized transcription errors may still be present.    Bill Salinas, PA-C 02/09/21 1006    Virgina Norfolk, DO 02/09/21 1047

## 2022-05-30 ENCOUNTER — Other Ambulatory Visit (HOSPITAL_COMMUNITY): Payer: Self-pay
